# Patient Record
Sex: Female | Born: 1935 | Race: White | Hispanic: No | Smoking: Never smoker
Health system: Southern US, Community
[De-identification: ages and names within clinical notes are randomized; demographics above are authoritative.]

## PROBLEM LIST (undated history)

## (undated) DIAGNOSIS — J9601 Acute respiratory failure with hypoxia: Secondary | ICD-10-CM

## (undated) DIAGNOSIS — I5032 Chronic diastolic (congestive) heart failure: Secondary | ICD-10-CM

## (undated) DIAGNOSIS — S72009A Fracture of unspecified part of neck of unspecified femur, initial encounter for closed fracture: Secondary | ICD-10-CM

## (undated) DIAGNOSIS — D469 Myelodysplastic syndrome, unspecified: Principal | ICD-10-CM

## (undated) DIAGNOSIS — I350 Nonrheumatic aortic (valve) stenosis: Secondary | ICD-10-CM

## (undated) DIAGNOSIS — J9610 Chronic respiratory failure, unspecified whether with hypoxia or hypercapnia: Secondary | ICD-10-CM

## (undated) DIAGNOSIS — G9340 Encephalopathy, unspecified: Secondary | ICD-10-CM

## (undated) DIAGNOSIS — D649 Anemia, unspecified: Secondary | ICD-10-CM

## (undated) DIAGNOSIS — I272 Pulmonary hypertension, unspecified: Secondary | ICD-10-CM

## (undated) DIAGNOSIS — I509 Heart failure, unspecified: Secondary | ICD-10-CM

## (undated) DIAGNOSIS — D489 Neoplasm of uncertain behavior, unspecified: Secondary | ICD-10-CM

## (undated) DIAGNOSIS — L8990 Pressure ulcer of unspecified site, unspecified stage: Secondary | ICD-10-CM

## (undated) DIAGNOSIS — E039 Hypothyroidism, unspecified: Secondary | ICD-10-CM

## (undated) DIAGNOSIS — N184 Chronic kidney disease, stage 4 (severe): Secondary | ICD-10-CM

## (undated) DIAGNOSIS — R0603 Acute respiratory distress: Secondary | ICD-10-CM

## (undated) DIAGNOSIS — I4821 Permanent atrial fibrillation: Secondary | ICD-10-CM

## (undated) DIAGNOSIS — R109 Unspecified abdominal pain: Secondary | ICD-10-CM

## (undated) DIAGNOSIS — G3184 Mild cognitive impairment, so stated: Secondary | ICD-10-CM

## (undated) DIAGNOSIS — T148XXA Other injury of unspecified body region, initial encounter: Secondary | ICD-10-CM

## (undated) DIAGNOSIS — I10 Essential (primary) hypertension: Secondary | ICD-10-CM

## (undated) DIAGNOSIS — R001 Bradycardia, unspecified: Secondary | ICD-10-CM

## (undated) DIAGNOSIS — I4510 Unspecified right bundle-branch block: Secondary | ICD-10-CM

## (undated) DIAGNOSIS — J1100 Influenza due to unidentified influenza virus with unspecified type of pneumonia: Secondary | ICD-10-CM

## (undated) HISTORY — DX: Influenza due to unidentified influenza virus with unspecified type of pneumonia: J11.00

## (undated) HISTORY — DX: Anemia, unspecified: D64.9

## (undated) HISTORY — PX: APPENDECTOMY: SHX54

## (undated) HISTORY — DX: Bradycardia, unspecified: R00.1

## (undated) HISTORY — DX: Fracture of unspecified part of neck of unspecified femur, initial encounter for closed fracture: S72.009A

## (undated) HISTORY — DX: Unspecified abdominal pain: R10.9

## (undated) HISTORY — DX: Acute respiratory distress: R06.03

## (undated) HISTORY — DX: Encephalopathy, unspecified: G93.40

## (undated) HISTORY — DX: Acute respiratory failure with hypoxia: J96.01

## (undated) HISTORY — DX: Pressure ulcer of unspecified site, unspecified stage: L89.90

## (undated) HISTORY — DX: Heart failure, unspecified: I50.9

## (undated) HISTORY — DX: Myelodysplastic syndrome, unspecified: D46.9

## (undated) HISTORY — DX: Nonrheumatic aortic (valve) stenosis: I35.0

## (undated) HISTORY — DX: Unspecified right bundle-branch block: I45.10

## (undated) HISTORY — DX: Other injury of unspecified body region, initial encounter: T14.8XXA

## (undated) HISTORY — DX: Hypothyroidism, unspecified: E03.9

## (undated) HISTORY — DX: Pulmonary hypertension, unspecified: I27.20

## (undated) HISTORY — DX: Neoplasm of uncertain behavior, unspecified: D48.9

---

## 1999-11-27 HISTORY — PX: ORIF FEMUR FRACTURE: SHX2119

## 1999-12-20 ENCOUNTER — Encounter: Payer: Self-pay | Admitting: Emergency Medicine

## 1999-12-21 ENCOUNTER — Inpatient Hospital Stay (HOSPITAL_COMMUNITY): Admission: EM | Admit: 1999-12-21 | Discharge: 1999-12-27 | Payer: Self-pay | Admitting: Emergency Medicine

## 1999-12-21 ENCOUNTER — Encounter: Payer: Self-pay | Admitting: Orthopaedic Surgery

## 2007-03-15 ENCOUNTER — Emergency Department (HOSPITAL_COMMUNITY): Admission: EM | Admit: 2007-03-15 | Discharge: 2007-03-15 | Payer: Self-pay | Admitting: Emergency Medicine

## 2009-01-27 ENCOUNTER — Inpatient Hospital Stay (HOSPITAL_COMMUNITY): Admission: EM | Admit: 2009-01-27 | Discharge: 2009-02-03 | Payer: Self-pay | Admitting: Emergency Medicine

## 2009-01-27 ENCOUNTER — Ambulatory Visit: Payer: Self-pay | Admitting: Internal Medicine

## 2009-01-27 ENCOUNTER — Ambulatory Visit: Payer: Self-pay | Admitting: Surgery

## 2009-01-27 ENCOUNTER — Encounter (INDEPENDENT_AMBULATORY_CARE_PROVIDER_SITE_OTHER): Payer: Self-pay | Admitting: Emergency Medicine

## 2009-01-28 ENCOUNTER — Ambulatory Visit: Payer: Self-pay | Admitting: Hematology & Oncology

## 2009-01-28 ENCOUNTER — Encounter: Payer: Self-pay | Admitting: Internal Medicine

## 2009-01-28 ENCOUNTER — Ambulatory Visit: Payer: Self-pay | Admitting: Internal Medicine

## 2009-02-01 ENCOUNTER — Encounter: Payer: Self-pay | Admitting: Gastroenterology

## 2009-02-01 ENCOUNTER — Encounter (INDEPENDENT_AMBULATORY_CARE_PROVIDER_SITE_OTHER): Payer: Self-pay | Admitting: Interventional Radiology

## 2009-02-03 ENCOUNTER — Ambulatory Visit: Payer: Self-pay | Admitting: Hematology & Oncology

## 2009-02-08 ENCOUNTER — Encounter: Payer: Self-pay | Admitting: Gastroenterology

## 2009-02-09 ENCOUNTER — Encounter: Payer: Self-pay | Admitting: Cardiology

## 2009-02-09 LAB — CONVERTED CEMR LAB
Basophils Absolute: 0 10*3/uL (ref 0.0–0.1)
Eosinophils Absolute: 0.1 10*3/uL (ref 0.0–0.7)
Hemoglobin: 9.7 g/dL — ABNORMAL LOW (ref 12.0–15.0)
Lymphocytes Relative: 10.9 % — ABNORMAL LOW (ref 12.0–46.0)
Lymphs Abs: 1 10*3/uL (ref 0.7–4.0)
MCHC: 32.9 g/dL (ref 30.0–36.0)
Neutro Abs: 6.9 10*3/uL (ref 1.4–7.7)
RDW: 20.1 % — ABNORMAL HIGH (ref 11.5–14.6)

## 2009-02-15 DIAGNOSIS — D599 Acquired hemolytic anemia, unspecified: Secondary | ICD-10-CM | POA: Insufficient documentation

## 2009-02-15 DIAGNOSIS — D649 Anemia, unspecified: Secondary | ICD-10-CM | POA: Insufficient documentation

## 2009-02-15 DIAGNOSIS — E039 Hypothyroidism, unspecified: Secondary | ICD-10-CM

## 2009-02-16 ENCOUNTER — Encounter: Payer: Self-pay | Admitting: Internal Medicine

## 2009-02-16 ENCOUNTER — Ambulatory Visit: Payer: Self-pay | Admitting: Internal Medicine

## 2009-02-23 ENCOUNTER — Ambulatory Visit: Payer: Self-pay | Admitting: Oncology

## 2009-02-25 ENCOUNTER — Encounter (HOSPITAL_COMMUNITY): Admission: RE | Admit: 2009-02-25 | Discharge: 2009-03-31 | Payer: Self-pay | Admitting: Oncology

## 2009-02-25 LAB — CBC WITH DIFFERENTIAL/PLATELET
Basophils Absolute: 0.1 10*3/uL (ref 0.0–0.1)
EOS%: 2.8 % (ref 0.0–7.0)
Eosinophils Absolute: 0.3 10*3/uL (ref 0.0–0.5)
HGB: 7.8 g/dL — ABNORMAL LOW (ref 11.6–15.9)
NEUT#: 8.8 10*3/uL — ABNORMAL HIGH (ref 1.5–6.5)
RBC: 2.82 10*6/uL — ABNORMAL LOW (ref 3.70–5.45)
RDW: 22.5 % — ABNORMAL HIGH (ref 11.2–14.5)
lymph#: 1 10*3/uL (ref 0.9–3.3)

## 2009-02-28 LAB — TYPE & CROSSMATCH - CHCC

## 2009-02-28 LAB — FERRITIN: Ferritin: 1010 ng/mL — ABNORMAL HIGH (ref 10–291)

## 2009-03-09 ENCOUNTER — Encounter: Payer: Self-pay | Admitting: Cardiovascular Disease

## 2009-03-09 LAB — CBC WITH DIFFERENTIAL/PLATELET
BASO%: 0.3 % (ref 0.0–2.0)
Basophils Absolute: 0 10*3/uL (ref 0.0–0.1)
EOS%: 4.4 % (ref 0.0–7.0)
HCT: 25.9 % — ABNORMAL LOW (ref 34.8–46.6)
HGB: 8.6 g/dL — ABNORMAL LOW (ref 11.6–15.9)
LYMPH%: 6.7 % — ABNORMAL LOW (ref 14.0–49.7)
MCH: 27.5 pg (ref 25.1–34.0)
MCHC: 33.1 g/dL (ref 31.5–36.0)
MCV: 82.9 fL (ref 79.5–101.0)
MONO%: 7.3 % (ref 0.0–14.0)
NEUT%: 81.3 % — ABNORMAL HIGH (ref 38.4–76.8)
Platelets: 504 10*3/uL — ABNORMAL HIGH (ref 145–400)

## 2009-03-10 LAB — CBC & DIFF AND RETIC
EOS%: 6 % (ref 0.0–7.0)
Eosinophils Absolute: 0.5 10*3/uL (ref 0.0–0.5)
HGB: 8.4 g/dL — ABNORMAL LOW (ref 11.6–15.9)
IRF: 0.45 — ABNORMAL HIGH (ref 0.130–0.330)
MCV: 83.2 fL (ref 79.5–101.0)
MONO%: 6.7 % (ref 0.0–14.0)
NEUT#: 7.1 10*3/uL — ABNORMAL HIGH (ref 1.5–6.5)
RBC: 3.03 10*6/uL — ABNORMAL LOW (ref 3.70–5.45)
RDW: 21.8 % — ABNORMAL HIGH (ref 11.2–14.5)
RETIC #: 66.4 10*3/uL (ref 19.7–115.1)
Retic %: 2.2 % (ref 0.4–2.3)
WBC: 9.1 10*3/uL (ref 3.9–10.3)
lymph#: 0.8 10*3/uL — ABNORMAL LOW (ref 0.9–3.3)

## 2009-03-10 LAB — MORPHOLOGY: PLT EST: INCREASED

## 2009-03-10 LAB — COMPREHENSIVE METABOLIC PANEL
Albumin: 2.7 g/dL — ABNORMAL LOW (ref 3.5–5.2)
BUN: 22 mg/dL (ref 6–23)
Calcium: 8.6 mg/dL (ref 8.4–10.5)
Creatinine, Ser: 0.93 mg/dL (ref 0.40–1.20)
Potassium: 4.2 mEq/L (ref 3.5–5.3)
Total Protein: 6.7 g/dL (ref 6.0–8.3)

## 2009-03-10 LAB — CHCC SMEAR

## 2009-03-14 LAB — CBC WITH DIFFERENTIAL/PLATELET
BASO%: 0 % (ref 0.0–2.0)
Basophils Absolute: 0 10e3/uL (ref 0.0–0.1)
EOS%: 4.8 % (ref 0.0–7.0)
Eosinophils Absolute: 0.5 10e3/uL (ref 0.0–0.5)
HCT: 25.3 % — ABNORMAL LOW (ref 34.8–46.6)
HGB: 8.4 g/dL — ABNORMAL LOW (ref 11.6–15.9)
LYMPH%: 6.8 % — ABNORMAL LOW (ref 14.0–49.7)
MCH: 27.4 pg (ref 25.1–34.0)
MCHC: 33.2 g/dL (ref 31.5–36.0)
MCV: 82.7 fL (ref 79.5–101.0)
MONO#: 0.6 10e3/uL (ref 0.1–0.9)
MONO%: 5.6 % (ref 0.0–14.0)
NEUT#: 8.9 10e3/uL — ABNORMAL HIGH (ref 1.5–6.5)
NEUT%: 82.8 % — ABNORMAL HIGH (ref 38.4–76.8)
Platelets: 551 10e3/uL — ABNORMAL HIGH (ref 145–400)
RBC: 3.05 10e6/uL — ABNORMAL LOW (ref 3.70–5.45)
RDW: 22.4 % — ABNORMAL HIGH (ref 11.2–14.5)
WBC: 10.8 10e3/uL — ABNORMAL HIGH (ref 3.9–10.3)
lymph#: 0.7 10e3/uL — ABNORMAL LOW (ref 0.9–3.3)

## 2009-03-14 LAB — IMMUNOFIXATION ELECTROPHORESIS: Total Protein, Serum Electrophoresis: 6.7 g/dL (ref 6.0–8.3)

## 2009-03-14 LAB — IRON AND TIBC
%SAT: 16 % — ABNORMAL LOW (ref 20–55)
TIBC: 228 ug/dL — ABNORMAL LOW (ref 250–470)
UIBC: 191 ug/dL

## 2009-03-14 LAB — HOLD TUBE, BLOOD BANK

## 2009-03-14 LAB — ANA: Anti Nuclear Antibody(ANA): NEGATIVE

## 2009-03-14 LAB — FERRITIN: Ferritin: 1375 ng/mL — ABNORMAL HIGH (ref 10–291)

## 2009-03-14 LAB — ERYTHROPOIETIN: Erythropoietin: 84.8 m[IU]/mL — ABNORMAL HIGH (ref 2.6–34.0)

## 2009-03-14 LAB — SEDIMENTATION RATE: Sed Rate: 2 mm/h (ref 0–22)

## 2009-03-16 ENCOUNTER — Ambulatory Visit (HOSPITAL_COMMUNITY): Admission: RE | Admit: 2009-03-16 | Discharge: 2009-03-16 | Payer: Self-pay | Admitting: Oncology

## 2009-03-17 LAB — UIFE/LIGHT CHAINS/TP QN, 24-HR UR
Alpha 2, Urine: DETECTED — AB
Beta, Urine: DETECTED — AB
Free Kappa Lt Chains,Ur: 7.91 mg/dL — ABNORMAL HIGH (ref 0.04–1.51)
Free Lt Chn Excr Rate: 61.3 mg/d
Total Protein, Urine: 14.6 mg/dL

## 2009-03-17 LAB — CREATININE CLEARANCE, URINE, 24 HOUR
Collection Interval-CRCL: 24 hours
Creatinine Clearance: 47 mL/min — ABNORMAL LOW (ref 75–115)
Creatinine, Urine: 95.5 mg/dL

## 2009-03-23 LAB — CBC WITH DIFFERENTIAL/PLATELET
BASO%: 1.1 % (ref 0.0–2.0)
LYMPH%: 8.1 % — ABNORMAL LOW (ref 14.0–49.7)
MCHC: 31.9 g/dL (ref 31.5–36.0)
MONO#: 1.1 10*3/uL — ABNORMAL HIGH (ref 0.1–0.9)
RBC: 3.31 10*6/uL — ABNORMAL LOW (ref 3.70–5.45)
RDW: 21.1 % — ABNORMAL HIGH (ref 11.2–14.5)
WBC: 11.4 10*3/uL — ABNORMAL HIGH (ref 3.9–10.3)
lymph#: 0.9 10*3/uL (ref 0.9–3.3)
nRBC: 0 % (ref 0–0)

## 2009-03-30 ENCOUNTER — Telehealth: Payer: Self-pay | Admitting: Internal Medicine

## 2009-03-30 ENCOUNTER — Encounter: Payer: Self-pay | Admitting: Cardiovascular Disease

## 2009-03-30 LAB — CBC WITH DIFFERENTIAL/PLATELET
BASO%: 0.1 % (ref 0.0–2.0)
EOS%: 7.7 % — ABNORMAL HIGH (ref 0.0–7.0)
Eosinophils Absolute: 0.9 10*3/uL — ABNORMAL HIGH (ref 0.0–0.5)
MCH: 27.8 pg (ref 25.1–34.0)
MCV: 83.6 fL (ref 79.5–101.0)
MONO%: 7 % (ref 0.0–14.0)
NEUT#: 9.3 10*3/uL — ABNORMAL HIGH (ref 1.5–6.5)
RBC: 3.09 10*6/uL — ABNORMAL LOW (ref 3.70–5.45)
RDW: 22.9 % — ABNORMAL HIGH (ref 11.2–14.5)

## 2009-03-30 LAB — TYPE & CROSSMATCH - CHCC

## 2009-04-06 LAB — CBC WITH DIFFERENTIAL/PLATELET
BASO%: 0.9 % (ref 0.0–2.0)
MCHC: 32.3 g/dL (ref 31.5–36.0)
MONO#: 1 10*3/uL — ABNORMAL HIGH (ref 0.1–0.9)
RBC: 3.51 10*6/uL — ABNORMAL LOW (ref 3.70–5.45)
RDW: 21 % — ABNORMAL HIGH (ref 11.2–14.5)
WBC: 11.6 10*3/uL — ABNORMAL HIGH (ref 3.9–10.3)
lymph#: 1 10*3/uL (ref 0.9–3.3)

## 2009-04-18 ENCOUNTER — Ambulatory Visit: Payer: Self-pay | Admitting: Oncology

## 2009-04-21 ENCOUNTER — Encounter (HOSPITAL_COMMUNITY): Admission: RE | Admit: 2009-04-21 | Discharge: 2009-05-25 | Payer: Self-pay | Admitting: Oncology

## 2009-05-02 ENCOUNTER — Encounter: Payer: Self-pay | Admitting: Cardiovascular Disease

## 2009-05-03 ENCOUNTER — Observation Stay (HOSPITAL_COMMUNITY): Admission: AD | Admit: 2009-05-03 | Discharge: 2009-05-03 | Payer: Self-pay | Admitting: Oncology

## 2009-05-03 ENCOUNTER — Encounter: Payer: Self-pay | Admitting: Cardiovascular Disease

## 2009-05-03 LAB — COMPREHENSIVE METABOLIC PANEL
ALT: 8 U/L (ref 0–35)
CO2: 23 mEq/L (ref 19–32)
Calcium: 8.8 mg/dL (ref 8.4–10.5)
Chloride: 104 mEq/L (ref 96–112)
Glucose, Bld: 111 mg/dL — ABNORMAL HIGH (ref 70–99)
Sodium: 137 mEq/L (ref 135–145)
Total Protein: 7.5 g/dL (ref 6.0–8.3)

## 2009-05-03 LAB — CBC WITH DIFFERENTIAL/PLATELET
BASO%: 0.7 % (ref 0.0–2.0)
Eosinophils Absolute: 0.2 10*3/uL (ref 0.0–0.5)
HCT: 23.7 % — ABNORMAL LOW (ref 34.8–46.6)
MCHC: 31.2 g/dL — ABNORMAL LOW (ref 31.5–36.0)
MONO#: 0.6 10*3/uL (ref 0.1–0.9)
NEUT#: 4.5 10*3/uL (ref 1.5–6.5)
NEUT%: 75.3 % (ref 38.4–76.8)
Platelets: 256 10*3/uL (ref 145–400)
WBC: 6 10*3/uL (ref 3.9–10.3)
lymph#: 0.7 10*3/uL — ABNORMAL LOW (ref 0.9–3.3)

## 2009-05-04 LAB — HEPATITIS B SURFACE ANTIGEN: Hepatitis B Surface Ag: NEGATIVE

## 2009-05-04 LAB — TYPE & CROSSMATCH - CHCC

## 2009-05-04 LAB — HEPATITIS C ANTIBODY: HCV Ab: NEGATIVE

## 2009-05-18 LAB — CBC WITH DIFFERENTIAL/PLATELET
BASO%: 1.1 % (ref 0.0–2.0)
LYMPH%: 13.9 % — ABNORMAL LOW (ref 14.0–49.7)
MCHC: 34.7 g/dL (ref 31.5–36.0)
MCV: 89.3 fL (ref 79.5–101.0)
MONO%: 8.6 % (ref 0.0–14.0)
Platelets: 317 10*3/uL (ref 145–400)
RBC: 3.13 10*6/uL — ABNORMAL LOW (ref 3.70–5.45)
RDW: 24.3 % — ABNORMAL HIGH (ref 11.2–14.5)
WBC: 6.7 10*3/uL (ref 3.9–10.3)

## 2009-06-02 ENCOUNTER — Ambulatory Visit: Payer: Self-pay | Admitting: Oncology

## 2009-06-02 LAB — CBC & DIFF AND RETIC
Basophils Absolute: 0.1 10*3/uL (ref 0.0–0.1)
Eosinophils Absolute: 0.3 10*3/uL (ref 0.0–0.5)
HGB: 9.8 g/dL — ABNORMAL LOW (ref 11.6–15.9)
Immature Retic Fract: 5 % (ref 0.00–10.70)
MCV: 89.6 fL (ref 79.5–101.0)
MONO#: 0.8 10*3/uL (ref 0.1–0.9)
MONO%: 10.2 % (ref 0.0–14.0)
NEUT#: 5.2 10*3/uL (ref 1.5–6.5)
RBC: 3.38 10*6/uL — ABNORMAL LOW (ref 3.70–5.45)
RDW: 20.5 % — ABNORMAL HIGH (ref 11.2–14.5)
Retic %: 1.43 % (ref 0.50–1.50)
Retic Ct Abs: 48.33 10*3/uL (ref 18.30–72.70)
WBC: 7.4 10*3/uL (ref 3.9–10.3)
lymph#: 1.1 10*3/uL (ref 0.9–3.3)
nRBC: 0 % (ref 0–0)

## 2009-06-17 ENCOUNTER — Encounter: Payer: Self-pay | Admitting: Cardiovascular Disease

## 2009-06-17 LAB — CBC WITH DIFFERENTIAL/PLATELET
BASO%: 1 % (ref 0.0–2.0)
Eosinophils Absolute: 0.3 10*3/uL (ref 0.0–0.5)
LYMPH%: 11.7 % — ABNORMAL LOW (ref 14.0–49.7)
MCHC: 33.6 g/dL (ref 31.5–36.0)
MONO#: 0.6 10*3/uL (ref 0.1–0.9)
NEUT#: 5.4 10*3/uL (ref 1.5–6.5)
Platelets: 326 10*3/uL (ref 145–400)
RBC: 3.39 10*6/uL — ABNORMAL LOW (ref 3.70–5.45)
RDW: 20.9 % — ABNORMAL HIGH (ref 11.2–14.5)
WBC: 7.1 10*3/uL (ref 3.9–10.3)
lymph#: 0.8 10*3/uL — ABNORMAL LOW (ref 0.9–3.3)

## 2009-06-28 ENCOUNTER — Ambulatory Visit: Payer: Self-pay | Admitting: Oncology

## 2009-06-30 LAB — CBC WITH DIFFERENTIAL/PLATELET
BASO%: 1 % (ref 0.0–2.0)
Eosinophils Absolute: 0.3 10*3/uL (ref 0.0–0.5)
MCHC: 32.4 g/dL (ref 31.5–36.0)
MONO#: 0.5 10*3/uL (ref 0.1–0.9)
NEUT#: 4.3 10*3/uL (ref 1.5–6.5)
RBC: 3.66 10*6/uL — ABNORMAL LOW (ref 3.70–5.45)
RDW: 19.5 % — ABNORMAL HIGH (ref 11.2–14.5)
WBC: 6 10*3/uL (ref 3.9–10.3)
nRBC: 0 % (ref 0–0)

## 2009-07-14 LAB — CBC WITH DIFFERENTIAL/PLATELET
BASO%: 1.1 % (ref 0.0–2.0)
EOS%: 5.5 % (ref 0.0–7.0)
HCT: 32 % — ABNORMAL LOW (ref 34.8–46.6)
HGB: 10.2 g/dL — ABNORMAL LOW (ref 11.6–15.9)
MCHC: 31.9 g/dL (ref 31.5–36.0)
MONO#: 0.6 10*3/uL (ref 0.1–0.9)
NEUT%: 70.4 % (ref 38.4–76.8)
RDW: 19.7 % — ABNORMAL HIGH (ref 11.2–14.5)
WBC: 7.1 10*3/uL (ref 3.9–10.3)
lymph#: 1 10*3/uL (ref 0.9–3.3)

## 2009-07-29 ENCOUNTER — Ambulatory Visit: Payer: Self-pay | Admitting: Oncology

## 2009-07-29 LAB — CBC WITH DIFFERENTIAL/PLATELET
BASO%: 1 % (ref 0.0–2.0)
EOS%: 5.1 % (ref 0.0–7.0)
HCT: 35 % (ref 34.8–46.6)
LYMPH%: 14.8 % (ref 14.0–49.7)
MCH: 29.3 pg (ref 25.1–34.0)
MCHC: 32.6 g/dL (ref 31.5–36.0)
MCV: 90 fL (ref 79.5–101.0)
MONO%: 8.8 % (ref 0.0–14.0)
NEUT%: 70.3 % (ref 38.4–76.8)
lymph#: 1.2 10*3/uL (ref 0.9–3.3)

## 2009-08-18 LAB — CBC WITH DIFFERENTIAL/PLATELET
BASO%: 1.3 % (ref 0.0–2.0)
EOS%: 5.7 % (ref 0.0–7.0)
LYMPH%: 13.4 % — ABNORMAL LOW (ref 14.0–49.7)
MCH: 29.1 pg (ref 25.1–34.0)
MCHC: 32.5 g/dL (ref 31.5–36.0)
MCV: 89.5 fL (ref 79.5–101.0)
MONO%: 9.3 % (ref 0.0–14.0)
NEUT#: 5.3 10*3/uL (ref 1.5–6.5)
RBC: 3.51 10*6/uL — ABNORMAL LOW (ref 3.70–5.45)
RDW: 18.9 % — ABNORMAL HIGH (ref 11.2–14.5)

## 2009-09-06 ENCOUNTER — Ambulatory Visit: Payer: Self-pay | Admitting: Oncology

## 2009-09-08 LAB — CBC WITH DIFFERENTIAL/PLATELET
Eosinophils Absolute: 0.3 10*3/uL (ref 0.0–0.5)
LYMPH%: 14.9 % (ref 14.0–49.7)
MCHC: 32.7 g/dL (ref 31.5–36.0)
MCV: 91 fL (ref 79.5–101.0)
MONO%: 8.5 % (ref 0.0–14.0)
NEUT#: 5.1 10*3/uL (ref 1.5–6.5)
Platelets: 249 10*3/uL (ref 145–400)
RBC: 3.56 10*6/uL — ABNORMAL LOW (ref 3.70–5.45)
nRBC: 0 % (ref 0–0)

## 2009-09-29 LAB — CBC WITH DIFFERENTIAL/PLATELET
Eosinophils Absolute: 0.2 10*3/uL (ref 0.0–0.5)
HCT: 34 % — ABNORMAL LOW (ref 34.8–46.6)
LYMPH%: 16.7 % (ref 14.0–49.7)
MCHC: 32.4 g/dL (ref 31.5–36.0)
MONO#: 0.5 10*3/uL (ref 0.1–0.9)
NEUT#: 4.7 10*3/uL (ref 1.5–6.5)
NEUT%: 70.6 % (ref 38.4–76.8)
Platelets: 268 10*3/uL (ref 145–400)
WBC: 6.7 10*3/uL (ref 3.9–10.3)

## 2009-10-17 ENCOUNTER — Ambulatory Visit: Payer: Self-pay | Admitting: Oncology

## 2009-10-21 LAB — CBC WITH DIFFERENTIAL/PLATELET
Basophils Absolute: 0.1 10*3/uL (ref 0.0–0.1)
EOS%: 4.2 % (ref 0.0–7.0)
HCT: 34.1 % — ABNORMAL LOW (ref 34.8–46.6)
HGB: 10.9 g/dL — ABNORMAL LOW (ref 11.6–15.9)
MCH: 29.8 pg (ref 25.1–34.0)
MCV: 93.2 fL (ref 79.5–101.0)
MONO%: 7.5 % (ref 0.0–14.0)
NEUT%: 70.9 % (ref 38.4–76.8)

## 2009-11-10 LAB — CBC WITH DIFFERENTIAL/PLATELET
Basophils Absolute: 0.1 10*3/uL (ref 0.0–0.1)
Eosinophils Absolute: 0.3 10*3/uL (ref 0.0–0.5)
HGB: 10.5 g/dL — ABNORMAL LOW (ref 11.6–15.9)
LYMPH%: 15.8 % (ref 14.0–49.7)
MONO#: 0.4 10*3/uL (ref 0.1–0.9)
NEUT#: 4.8 10*3/uL (ref 1.5–6.5)
Platelets: 313 10*3/uL (ref 145–400)
RBC: 3.39 10*6/uL — ABNORMAL LOW (ref 3.70–5.45)
WBC: 6.7 10*3/uL (ref 3.9–10.3)

## 2009-11-28 ENCOUNTER — Ambulatory Visit: Payer: Self-pay | Admitting: Oncology

## 2009-12-01 LAB — CBC WITH DIFFERENTIAL/PLATELET
BASO%: 0.7 % (ref 0.0–2.0)
EOS%: 3.5 % (ref 0.0–7.0)
HGB: 10.6 g/dL — ABNORMAL LOW (ref 11.6–15.9)
MCH: 30.6 pg (ref 25.1–34.0)
MCHC: 33.3 g/dL (ref 31.5–36.0)
RDW: 19.9 % — ABNORMAL HIGH (ref 11.2–14.5)
lymph#: 1.2 10*3/uL (ref 0.9–3.3)

## 2009-12-01 LAB — COMPREHENSIVE METABOLIC PANEL
ALT: 13 U/L (ref 0–35)
AST: 17 U/L (ref 0–37)
Albumin: 4 g/dL (ref 3.5–5.2)
Calcium: 9.1 mg/dL (ref 8.4–10.5)
Chloride: 99 mEq/L (ref 96–112)
Potassium: 4.3 mEq/L (ref 3.5–5.3)

## 2009-12-01 LAB — IRON AND TIBC: TIBC: 270 ug/dL (ref 250–470)

## 2009-12-22 LAB — CBC WITH DIFFERENTIAL/PLATELET
Eosinophils Absolute: 0.3 10*3/uL (ref 0.0–0.5)
HCT: 30.7 % — ABNORMAL LOW (ref 34.8–46.6)
LYMPH%: 18.1 % (ref 14.0–49.7)
MONO#: 0.5 10*3/uL (ref 0.1–0.9)
NEUT#: 4.4 10*3/uL (ref 1.5–6.5)
Platelets: 303 10*3/uL (ref 145–400)
RBC: 3.38 10*6/uL — ABNORMAL LOW (ref 3.70–5.45)
WBC: 6.5 10*3/uL (ref 3.9–10.3)
lymph#: 1.2 10*3/uL (ref 0.9–3.3)

## 2010-01-09 ENCOUNTER — Ambulatory Visit: Payer: Self-pay | Admitting: Oncology

## 2010-01-11 LAB — CBC WITH DIFFERENTIAL/PLATELET
BASO%: 0.9 % (ref 0.0–2.0)
Eosinophils Absolute: 0.3 10*3/uL (ref 0.0–0.5)
LYMPH%: 12.7 % — ABNORMAL LOW (ref 14.0–49.7)
MCHC: 31.8 g/dL (ref 31.5–36.0)
MONO#: 0.6 10*3/uL (ref 0.1–0.9)
NEUT#: 5 10*3/uL (ref 1.5–6.5)
Platelets: 245 10*3/uL (ref 145–400)
RBC: 3.87 10*6/uL (ref 3.70–5.45)
RDW: 19 % — ABNORMAL HIGH (ref 11.2–14.5)
WBC: 6.7 10*3/uL (ref 3.9–10.3)
lymph#: 0.9 10*3/uL (ref 0.9–3.3)

## 2010-02-03 LAB — CBC & DIFF AND RETIC
Basophils Absolute: 0.1 10*3/uL (ref 0.0–0.1)
Eosinophils Absolute: 0.2 10*3/uL (ref 0.0–0.5)
HGB: 10.7 g/dL — ABNORMAL LOW (ref 11.6–15.9)
Immature Retic Fract: 8.5 % (ref 0.00–10.70)
MCV: 90.5 fL (ref 79.5–101.0)
MONO#: 0.4 10*3/uL (ref 0.1–0.9)
MONO%: 4.9 % (ref 0.0–14.0)
NEUT#: 6.2 10*3/uL (ref 1.5–6.5)
Platelets: 303 10*3/uL (ref 145–400)
RDW: 19.5 % — ABNORMAL HIGH (ref 11.2–14.5)
Retic %: 1.71 % — ABNORMAL HIGH (ref 0.50–1.50)
WBC: 7.9 10*3/uL (ref 3.9–10.3)
nRBC: 0 % (ref 0–0)

## 2010-02-03 LAB — MORPHOLOGY

## 2010-03-03 ENCOUNTER — Ambulatory Visit: Payer: Self-pay | Admitting: Oncology

## 2010-03-03 LAB — CBC WITH DIFFERENTIAL/PLATELET
BASO%: 1.1 % (ref 0.0–2.0)
Basophils Absolute: 0.1 10*3/uL (ref 0.0–0.1)
EOS%: 4.1 % (ref 0.0–7.0)
HCT: 31.4 % — ABNORMAL LOW (ref 34.8–46.6)
LYMPH%: 18.5 % (ref 14.0–49.7)
MCH: 29.8 pg (ref 25.1–34.0)
MCHC: 32.8 g/dL (ref 31.5–36.0)
NEUT%: 67.7 % (ref 38.4–76.8)
Platelets: 264 10*3/uL (ref 145–400)

## 2010-03-31 LAB — CBC WITH DIFFERENTIAL/PLATELET
Basophils Absolute: 0.1 10*3/uL (ref 0.0–0.1)
EOS%: 4 % (ref 0.0–7.0)
HCT: 32.3 % — ABNORMAL LOW (ref 34.8–46.6)
HGB: 10.2 g/dL — ABNORMAL LOW (ref 11.6–15.9)
LYMPH%: 13.5 % — ABNORMAL LOW (ref 14.0–49.7)
MCH: 29.3 pg (ref 25.1–34.0)
MCV: 92.8 fL (ref 79.5–101.0)
MONO%: 7.3 % (ref 0.0–14.0)
NEUT%: 73.9 % (ref 38.4–76.8)
Platelets: 286 10*3/uL (ref 145–400)

## 2010-04-27 ENCOUNTER — Ambulatory Visit: Payer: Self-pay | Admitting: Oncology

## 2010-04-28 LAB — CBC WITH DIFFERENTIAL/PLATELET
Basophils Absolute: 0.1 10*3/uL (ref 0.0–0.1)
EOS%: 4.8 % (ref 0.0–7.0)
HCT: 32 % — ABNORMAL LOW (ref 34.8–46.6)
HGB: 10.2 g/dL — ABNORMAL LOW (ref 11.6–15.9)
MCH: 29.7 pg (ref 25.1–34.0)
NEUT%: 66.1 % (ref 38.4–76.8)
lymph#: 1.1 10*3/uL (ref 0.9–3.3)

## 2010-05-26 LAB — CBC WITH DIFFERENTIAL/PLATELET
BASO%: 1.9 % (ref 0.0–2.0)
Basophils Absolute: 0.1 10*3/uL (ref 0.0–0.1)
EOS%: 4.3 % (ref 0.0–7.0)
HGB: 10.3 g/dL — ABNORMAL LOW (ref 11.6–15.9)
MCH: 29.7 pg (ref 25.1–34.0)
MCHC: 32.3 g/dL (ref 31.5–36.0)
MCV: 91.9 fL (ref 79.5–101.0)
MONO%: 8 % (ref 0.0–14.0)
RBC: 3.47 10*6/uL — ABNORMAL LOW (ref 3.70–5.45)
RDW: 18.9 % — ABNORMAL HIGH (ref 11.2–14.5)

## 2010-06-07 ENCOUNTER — Ambulatory Visit: Payer: Self-pay | Admitting: Oncology

## 2010-06-09 LAB — CBC WITH DIFFERENTIAL/PLATELET
BASO%: 0.7 % (ref 0.0–2.0)
LYMPH%: 9.7 % — ABNORMAL LOW (ref 14.0–49.7)
MCHC: 34.2 g/dL (ref 31.5–36.0)
MONO#: 0.4 10*3/uL (ref 0.1–0.9)
MONO%: 6.5 % (ref 0.0–14.0)
Platelets: 280 10*3/uL (ref 145–400)
RBC: 3.4 10*6/uL — ABNORMAL LOW (ref 3.70–5.45)
RDW: 20.4 % — ABNORMAL HIGH (ref 11.2–14.5)
WBC: 6.7 10*3/uL (ref 3.9–10.3)

## 2010-06-09 LAB — MORPHOLOGY

## 2010-06-13 LAB — KAPPA/LAMBDA LIGHT CHAINS: Kappa:Lambda Ratio: 0.75 (ref 0.26–1.65)

## 2010-06-13 LAB — LACTATE DEHYDROGENASE: LDH: 206 U/L (ref 94–250)

## 2010-06-13 LAB — COMPREHENSIVE METABOLIC PANEL
Albumin: 3.9 g/dL (ref 3.5–5.2)
CO2: 27 mEq/L (ref 19–32)
Calcium: 9.3 mg/dL (ref 8.4–10.5)
Glucose, Bld: 91 mg/dL (ref 70–99)
Potassium: 4.3 mEq/L (ref 3.5–5.3)
Sodium: 135 mEq/L (ref 135–145)
Total Protein: 7.2 g/dL (ref 6.0–8.3)

## 2010-06-13 LAB — IMMUNOFIXATION ELECTROPHORESIS: Total Protein, Serum Electrophoresis: 7.2 g/dL (ref 6.0–8.3)

## 2010-06-23 LAB — CBC WITH DIFFERENTIAL/PLATELET
BASO%: 0.1 % (ref 0.0–2.0)
Basophils Absolute: 0 10*3/uL (ref 0.0–0.1)
HCT: 32.3 % — ABNORMAL LOW (ref 34.8–46.6)
HGB: 10.6 g/dL — ABNORMAL LOW (ref 11.6–15.9)
MCHC: 32.9 g/dL (ref 31.5–36.0)
MONO#: 0.2 10*3/uL (ref 0.1–0.9)
NEUT#: 5.2 10*3/uL (ref 1.5–6.5)
NEUT%: 80.1 % — ABNORMAL HIGH (ref 38.4–76.8)
WBC: 6.5 10*3/uL (ref 3.9–10.3)
lymph#: 0.8 10*3/uL — ABNORMAL LOW (ref 0.9–3.3)

## 2010-07-07 ENCOUNTER — Ambulatory Visit: Payer: Self-pay | Admitting: Oncology

## 2010-07-07 LAB — CBC WITH DIFFERENTIAL/PLATELET
BASO%: 0.8 % (ref 0.0–2.0)
Basophils Absolute: 0.1 10*3/uL (ref 0.0–0.1)
EOS%: 3.5 % (ref 0.0–7.0)
HCT: 31.2 % — ABNORMAL LOW (ref 34.8–46.6)
HGB: 10 g/dL — ABNORMAL LOW (ref 11.6–15.9)
LYMPH%: 14.4 % (ref 14.0–49.7)
MCH: 28.7 pg (ref 25.1–34.0)
MCHC: 32.1 g/dL (ref 31.5–36.0)
MCV: 89.7 fL (ref 79.5–101.0)
MONO%: 7.6 % (ref 0.0–14.0)
NEUT%: 73.7 % (ref 38.4–76.8)
Platelets: 237 10*3/uL (ref 145–400)

## 2010-08-09 ENCOUNTER — Ambulatory Visit: Payer: Self-pay | Admitting: Oncology

## 2010-08-11 LAB — CBC WITH DIFFERENTIAL/PLATELET
Eosinophils Absolute: 0.2 10*3/uL (ref 0.0–0.5)
HCT: 32.4 % — ABNORMAL LOW (ref 34.8–46.6)
LYMPH%: 12.7 % — ABNORMAL LOW (ref 14.0–49.7)
MCV: 91 fL (ref 79.5–101.0)
MONO#: 0.6 10*3/uL (ref 0.1–0.9)
MONO%: 8.2 % (ref 0.0–14.0)
NEUT#: 5.5 10*3/uL (ref 1.5–6.5)
NEUT%: 74.7 % (ref 38.4–76.8)
Platelets: 262 10*3/uL (ref 145–400)
RBC: 3.56 10*6/uL — ABNORMAL LOW (ref 3.70–5.45)
WBC: 7.3 10*3/uL (ref 3.9–10.3)

## 2010-09-08 ENCOUNTER — Ambulatory Visit: Payer: Self-pay | Admitting: Oncology

## 2010-09-08 LAB — COMPREHENSIVE METABOLIC PANEL
BUN: 23 mg/dL (ref 6–23)
CO2: 30 mEq/L (ref 19–32)
Calcium: 9 mg/dL (ref 8.4–10.5)
Chloride: 105 mEq/L (ref 96–112)
Creatinine, Ser: 1.23 mg/dL — ABNORMAL HIGH (ref 0.40–1.20)
Glucose, Bld: 99 mg/dL (ref 70–99)
Total Bilirubin: 1.4 mg/dL — ABNORMAL HIGH (ref 0.3–1.2)

## 2010-09-08 LAB — CBC WITH DIFFERENTIAL/PLATELET
BASO%: 0.5 % (ref 0.0–2.0)
Basophils Absolute: 0 10*3/uL (ref 0.0–0.1)
Eosinophils Absolute: 0.2 10*3/uL (ref 0.0–0.5)
HCT: 28.9 % — ABNORMAL LOW (ref 34.8–46.6)
HGB: 9.8 g/dL — ABNORMAL LOW (ref 11.6–15.9)
LYMPH%: 16.4 % (ref 14.0–49.7)
MCHC: 33.8 g/dL (ref 31.5–36.0)
MONO#: 0.4 10*3/uL (ref 0.1–0.9)
NEUT%: 72.1 % (ref 38.4–76.8)
Platelets: 297 10*3/uL (ref 145–400)
WBC: 6.1 10*3/uL (ref 3.9–10.3)

## 2010-09-08 LAB — LACTATE DEHYDROGENASE: LDH: 186 U/L (ref 94–250)

## 2010-09-13 LAB — IMMUNOFIXATION ELECTROPHORESIS
IgA: 243 mg/dL (ref 68–378)
IgG (Immunoglobin G), Serum: 1730 mg/dL — ABNORMAL HIGH (ref 694–1618)
IgM, Serum: 142 mg/dL (ref 60–263)
Total Protein, Serum Electrophoresis: 7.2 g/dL (ref 6.0–8.3)

## 2010-09-29 ENCOUNTER — Encounter: Payer: Self-pay | Admitting: Internal Medicine

## 2010-09-29 LAB — CBC WITH DIFFERENTIAL/PLATELET
Basophils Absolute: 0.1 10*3/uL (ref 0.0–0.1)
EOS%: 3.6 % (ref 0.0–7.0)
Eosinophils Absolute: 0.3 10*3/uL (ref 0.0–0.5)
HCT: 31.7 % — ABNORMAL LOW (ref 34.8–46.6)
HGB: 10.1 g/dL — ABNORMAL LOW (ref 11.6–15.9)
MCH: 28.6 pg (ref 25.1–34.0)
MCV: 89.8 fL (ref 79.5–101.0)
MONO%: 7.6 % (ref 0.0–14.0)
NEUT#: 5.5 10*3/uL (ref 1.5–6.5)
NEUT%: 73.3 % (ref 38.4–76.8)
RDW: 19.6 % — ABNORMAL HIGH (ref 11.2–14.5)
lymph#: 1.1 10*3/uL (ref 0.9–3.3)

## 2010-10-06 LAB — CBC WITH DIFFERENTIAL/PLATELET
Basophils Absolute: 0.1 10*3/uL (ref 0.0–0.1)
Eosinophils Absolute: 0.3 10*3/uL (ref 0.0–0.5)
HCT: 31.9 % — ABNORMAL LOW (ref 34.8–46.6)
HGB: 10.4 g/dL — ABNORMAL LOW (ref 11.6–15.9)
MCV: 89.1 fL (ref 79.5–101.0)
MONO%: 9.5 % (ref 0.0–14.0)
NEUT#: 4.9 10*3/uL (ref 1.5–6.5)
NEUT%: 69.2 % (ref 38.4–76.8)
Platelets: 248 10*3/uL (ref 145–400)
RDW: 19.6 % — ABNORMAL HIGH (ref 11.2–14.5)

## 2010-11-01 ENCOUNTER — Ambulatory Visit (HOSPITAL_BASED_OUTPATIENT_CLINIC_OR_DEPARTMENT_OTHER): Payer: Medicare Other | Admitting: Oncology

## 2010-11-03 LAB — CBC WITH DIFFERENTIAL/PLATELET
BASO%: 1.2 % (ref 0.0–2.0)
Eosinophils Absolute: 0.3 10*3/uL (ref 0.0–0.5)
HCT: 31.8 % — ABNORMAL LOW (ref 34.8–46.6)
HGB: 10.1 g/dL — ABNORMAL LOW (ref 11.6–15.9)
LYMPH%: 15 % (ref 14.0–49.7)
MONO#: 0.5 10*3/uL (ref 0.1–0.9)
NEUT#: 4.8 10*3/uL (ref 1.5–6.5)
NEUT%: 72.4 % (ref 38.4–76.8)
Platelets: 267 10*3/uL (ref 145–400)
WBC: 6.6 10*3/uL (ref 3.9–10.3)
lymph#: 1 10*3/uL (ref 0.9–3.3)

## 2010-12-01 LAB — CBC WITH DIFFERENTIAL/PLATELET
BASO%: 1.2 % (ref 0.0–2.0)
Basophils Absolute: 0.1 10*3/uL (ref 0.0–0.1)
EOS%: 4.4 % (ref 0.0–7.0)
Eosinophils Absolute: 0.3 10*3/uL (ref 0.0–0.5)
HCT: 30.5 % — ABNORMAL LOW (ref 34.8–46.6)
HGB: 9.8 g/dL — ABNORMAL LOW (ref 11.6–15.9)
LYMPH%: 17.9 % (ref 14.0–49.7)
MCH: 29.1 pg (ref 25.1–34.0)
MCHC: 32.1 g/dL (ref 31.5–36.0)
MCV: 90.5 fL (ref 79.5–101.0)
MONO#: 0.5 10*3/uL (ref 0.1–0.9)
MONO%: 7.8 % (ref 0.0–14.0)
NEUT#: 4.1 10*3/uL (ref 1.5–6.5)
NEUT%: 68.7 % (ref 38.4–76.8)
Platelets: 261 10*3/uL (ref 145–400)
RBC: 3.37 10*6/uL — ABNORMAL LOW (ref 3.70–5.45)
RDW: 19.4 % — ABNORMAL HIGH (ref 11.2–14.5)
WBC: 5.9 10*3/uL (ref 3.9–10.3)
lymph#: 1.1 10*3/uL (ref 0.9–3.3)
nRBC: 0 % (ref 0–0)

## 2010-12-17 ENCOUNTER — Encounter (HOSPITAL_BASED_OUTPATIENT_CLINIC_OR_DEPARTMENT_OTHER): Payer: Self-pay | Admitting: Internal Medicine

## 2010-12-26 NOTE — Letter (Signed)
Summary: [facility]  [facility]   Imported By: Sherian Rein 10/10/2010 09:02:27  _____________________________________________________________________  External Attachment:    Type:   Image     Comment:   External Document

## 2010-12-29 ENCOUNTER — Ambulatory Visit (HOSPITAL_BASED_OUTPATIENT_CLINIC_OR_DEPARTMENT_OTHER): Payer: Medicare Other | Admitting: Oncology

## 2010-12-29 DIAGNOSIS — D469 Myelodysplastic syndrome, unspecified: Secondary | ICD-10-CM

## 2010-12-29 DIAGNOSIS — E039 Hypothyroidism, unspecified: Secondary | ICD-10-CM

## 2010-12-29 LAB — CBC WITH DIFFERENTIAL/PLATELET
Basophils Absolute: 0 10*3/uL (ref 0.0–0.1)
EOS%: 3.5 % (ref 0.0–7.0)
Eosinophils Absolute: 0.2 10*3/uL (ref 0.0–0.5)
HCT: 32 % — ABNORMAL LOW (ref 34.8–46.6)
HGB: 10.2 g/dL — ABNORMAL LOW (ref 11.6–15.9)
MCH: 29.2 pg (ref 25.1–34.0)
MCV: 91.7 fL (ref 79.5–101.0)
MONO%: 6.7 % (ref 0.0–14.0)
NEUT#: 4.5 10*3/uL (ref 1.5–6.5)
NEUT%: 72.5 % (ref 38.4–76.8)
RDW: 19.6 % — ABNORMAL HIGH (ref 11.2–14.5)
lymph#: 1 10*3/uL (ref 0.9–3.3)

## 2011-01-03 ENCOUNTER — Inpatient Hospital Stay (HOSPITAL_BASED_OUTPATIENT_CLINIC_OR_DEPARTMENT_OTHER): Admission: RE | Admit: 2011-01-03 | Payer: Medicare Other | Source: Ambulatory Visit

## 2011-01-03 DIAGNOSIS — I369 Nonrheumatic tricuspid valve disorder, unspecified: Secondary | ICD-10-CM

## 2011-01-04 ENCOUNTER — Ambulatory Visit (HOSPITAL_COMMUNITY)
Admission: RE | Admit: 2011-01-04 | Discharge: 2011-01-04 | Disposition: A | Discharge status: Home / Self Care | Payer: Medicare Other | Source: Ambulatory Visit | Attending: Emergency Medicine | Admitting: Emergency Medicine

## 2011-01-04 DIAGNOSIS — I379 Nonrheumatic pulmonary valve disorder, unspecified: Secondary | ICD-10-CM | POA: Insufficient documentation

## 2011-01-04 DIAGNOSIS — I079 Rheumatic tricuspid valve disease, unspecified: Secondary | ICD-10-CM | POA: Insufficient documentation

## 2011-01-04 DIAGNOSIS — I080 Rheumatic disorders of both mitral and aortic valves: Secondary | ICD-10-CM | POA: Insufficient documentation

## 2011-01-26 ENCOUNTER — Other Ambulatory Visit: Payer: Self-pay | Admitting: Oncology

## 2011-01-26 ENCOUNTER — Encounter (HOSPITAL_BASED_OUTPATIENT_CLINIC_OR_DEPARTMENT_OTHER): Payer: Medicare Other | Admitting: Oncology

## 2011-01-26 DIAGNOSIS — E039 Hypothyroidism, unspecified: Secondary | ICD-10-CM

## 2011-01-26 LAB — CBC WITH DIFFERENTIAL/PLATELET
Basophils Absolute: 0.1 10*3/uL (ref 0.0–0.1)
EOS%: 4.5 % (ref 0.0–7.0)
Eosinophils Absolute: 0.3 10*3/uL (ref 0.0–0.5)
LYMPH%: 19.5 % (ref 14.0–49.7)
MCH: 29.3 pg (ref 25.1–34.0)
MCV: 90.7 fL (ref 79.5–101.0)
MONO%: 7.1 % (ref 0.0–14.0)
NEUT#: 4.5 10*3/uL (ref 1.5–6.5)
Platelets: 260 10*3/uL (ref 145–400)
RBC: 3.76 10*6/uL (ref 3.70–5.45)

## 2011-02-02 ENCOUNTER — Encounter: Payer: Medicare Other | Admitting: Oncology

## 2011-02-06 ENCOUNTER — Other Ambulatory Visit: Payer: Self-pay | Admitting: Oncology

## 2011-02-06 ENCOUNTER — Encounter (HOSPITAL_BASED_OUTPATIENT_CLINIC_OR_DEPARTMENT_OTHER): Payer: Medicare Other | Admitting: Oncology

## 2011-02-06 DIAGNOSIS — D472 Monoclonal gammopathy: Secondary | ICD-10-CM

## 2011-02-06 DIAGNOSIS — E039 Hypothyroidism, unspecified: Secondary | ICD-10-CM

## 2011-02-06 DIAGNOSIS — D649 Anemia, unspecified: Secondary | ICD-10-CM

## 2011-02-06 DIAGNOSIS — D469 Myelodysplastic syndrome, unspecified: Secondary | ICD-10-CM

## 2011-02-06 LAB — CBC WITH DIFFERENTIAL/PLATELET
BASO%: 0.1 % (ref 0.0–2.0)
EOS%: 3.5 % (ref 0.0–7.0)
LYMPH%: 13.2 % — ABNORMAL LOW (ref 14.0–49.7)
MCHC: 33.8 g/dL (ref 31.5–36.0)
MCV: 89.5 fL (ref 79.5–101.0)
MONO%: 3.5 % (ref 0.0–14.0)
Platelets: 228 10*3/uL (ref 145–400)
RBC: 3.53 10*6/uL — ABNORMAL LOW (ref 3.70–5.45)

## 2011-02-23 ENCOUNTER — Other Ambulatory Visit: Payer: Self-pay | Admitting: Oncology

## 2011-02-23 ENCOUNTER — Encounter (HOSPITAL_BASED_OUTPATIENT_CLINIC_OR_DEPARTMENT_OTHER): Payer: Medicare Other | Admitting: Oncology

## 2011-02-23 DIAGNOSIS — E039 Hypothyroidism, unspecified: Secondary | ICD-10-CM

## 2011-02-23 DIAGNOSIS — D472 Monoclonal gammopathy: Secondary | ICD-10-CM

## 2011-02-23 DIAGNOSIS — D469 Myelodysplastic syndrome, unspecified: Secondary | ICD-10-CM

## 2011-02-23 LAB — CBC & DIFF AND RETIC
BASO%: 0.7 % (ref 0.0–2.0)
Immature Retic Fract: 6 % (ref 0.00–10.70)
LYMPH%: 18.3 % (ref 14.0–49.7)
MCHC: 32.2 g/dL (ref 31.5–36.0)
MONO#: 0.4 10*3/uL (ref 0.1–0.9)
RBC: 3.56 10*6/uL — ABNORMAL LOW (ref 3.70–5.45)
RDW: 18.8 % — ABNORMAL HIGH (ref 11.2–14.5)
Retic %: 1.51 % — ABNORMAL HIGH (ref 0.50–1.50)
WBC: 5.8 10*3/uL (ref 3.9–10.3)
lymph#: 1.1 10*3/uL (ref 0.9–3.3)

## 2011-02-23 LAB — MORPHOLOGY

## 2011-02-23 LAB — COMPREHENSIVE METABOLIC PANEL
AST: 28 U/L (ref 0–37)
Albumin: 3.7 g/dL (ref 3.5–5.2)
BUN: 23 mg/dL (ref 6–23)
Calcium: 9.3 mg/dL (ref 8.4–10.5)
Chloride: 105 mEq/L (ref 96–112)
Potassium: 3.9 mEq/L (ref 3.5–5.3)

## 2011-02-26 LAB — IRON AND TIBC
%SAT: 16 % — ABNORMAL LOW (ref 20–55)
Iron: 42 ug/dL (ref 42–145)

## 2011-02-26 LAB — ERYTHROPOIETIN: Erythropoietin: 74.7 m[IU]/mL — ABNORMAL HIGH (ref 2.6–34.0)

## 2011-03-05 LAB — CROSSMATCH

## 2011-03-06 LAB — CROSSMATCH
ABO/RH(D): A POS
Antibody Screen: POSITIVE
DAT, IgG: NEGATIVE

## 2011-03-07 LAB — CROSSMATCH
ABO/RH(D): A POS
ABO/RH(D): A POS
Antibody Screen: POSITIVE
Antibody Screen: POSITIVE
DAT, IgG: NEGATIVE

## 2011-03-07 LAB — ABO/RH: ABO/RH(D): A POS

## 2011-03-08 LAB — CARDIAC PANEL(CRET KIN+CKTOT+MB+TROPI)
Relative Index: INVALID (ref 0.0–2.5)
Relative Index: INVALID (ref 0.0–2.5)
Troponin I: 0.01 ng/mL (ref 0.00–0.06)
Troponin I: 0.02 ng/mL (ref 0.00–0.06)

## 2011-03-08 LAB — PROTIME-INR
INR: 1.2 (ref 0.00–1.49)
INR: 1.3 (ref 0.00–1.49)
INR: 1.4 (ref 0.00–1.49)
INR: 1.5 (ref 0.00–1.49)
Prothrombin Time: 15.3 seconds — ABNORMAL HIGH (ref 11.6–15.2)
Prothrombin Time: 16.4 seconds — ABNORMAL HIGH (ref 11.6–15.2)
Prothrombin Time: 17.8 seconds — ABNORMAL HIGH (ref 11.6–15.2)

## 2011-03-08 LAB — RETICULOCYTES
RBC.: 2.59 MIL/uL — ABNORMAL LOW (ref 3.87–5.11)
Retic Ct Pct: 2.5 % (ref 0.4–3.1)

## 2011-03-08 LAB — CBC
HCT: 21 % — ABNORMAL LOW (ref 36.0–46.0)
HCT: 26.3 % — ABNORMAL LOW (ref 36.0–46.0)
HCT: 27.4 % — ABNORMAL LOW (ref 36.0–46.0)
HCT: 29.1 % — ABNORMAL LOW (ref 36.0–46.0)
HCT: 32.3 % — ABNORMAL LOW (ref 36.0–46.0)
Hemoglobin: 8.8 g/dL — ABNORMAL LOW (ref 12.0–15.0)
Hemoglobin: 9.8 g/dL — ABNORMAL LOW (ref 12.0–15.0)
MCHC: 34.2 g/dL (ref 30.0–36.0)
MCHC: 34.9 g/dL (ref 30.0–36.0)
MCHC: 35.2 g/dL (ref 30.0–36.0)
MCV: 83.9 fL (ref 78.0–100.0)
MCV: 85.8 fL (ref 78.0–100.0)
MCV: 87.1 fL (ref 78.0–100.0)
Platelets: 296 10*3/uL (ref 150–400)
Platelets: 316 10*3/uL (ref 150–400)
Platelets: 322 10*3/uL (ref 150–400)
Platelets: 347 10*3/uL (ref 150–400)
RBC: 2.99 MIL/uL — ABNORMAL LOW (ref 3.87–5.11)
RBC: 3.09 MIL/uL — ABNORMAL LOW (ref 3.87–5.11)
RDW: 19.8 % — ABNORMAL HIGH (ref 11.5–15.5)
RDW: 20.4 % — ABNORMAL HIGH (ref 11.5–15.5)
RDW: 21.2 % — ABNORMAL HIGH (ref 11.5–15.5)
WBC: 5.2 10*3/uL (ref 4.0–10.5)
WBC: 6.6 10*3/uL (ref 4.0–10.5)
WBC: 6.9 10*3/uL (ref 4.0–10.5)
WBC: 7 10*3/uL (ref 4.0–10.5)
WBC: 7.1 10*3/uL (ref 4.0–10.5)

## 2011-03-08 LAB — CROSSMATCH
ABO/RH(D): A POS
ABO/RH(D): A POS
Antibody Screen: POSITIVE
Antibody Screen: POSITIVE
DAT, IgG: NEGATIVE

## 2011-03-08 LAB — HEMOGLOBIN A1C
Hgb A1c MFr Bld: 5.8 % (ref 4.6–6.1)
Mean Plasma Glucose: 120 mg/dL

## 2011-03-08 LAB — DIFFERENTIAL
Basophils Absolute: 0.1 10*3/uL (ref 0.0–0.1)
Lymphocytes Relative: 15 % (ref 12–46)
Lymphs Abs: 0.9 10*3/uL (ref 0.7–4.0)
Monocytes Absolute: 0.5 10*3/uL (ref 0.1–1.0)
Monocytes Absolute: 0.6 10*3/uL (ref 0.1–1.0)
Monocytes Relative: 10 % (ref 3–12)
Monocytes Relative: 9 % (ref 3–12)
Neutro Abs: 3.5 10*3/uL (ref 1.7–7.7)
Neutro Abs: 5.1 10*3/uL (ref 1.7–7.7)

## 2011-03-08 LAB — HEPARIN LEVEL (UNFRACTIONATED)
Heparin Unfractionated: 0.39 IU/mL (ref 0.30–0.70)
Heparin Unfractionated: 0.66 IU/mL (ref 0.30–0.70)
Heparin Unfractionated: <0.1 IU/mL — ABNORMAL LOW (ref 0.30–0.70)
Heparin Unfractionated: <0.1 IU/mL — ABNORMAL LOW (ref 0.30–0.70)

## 2011-03-08 LAB — COMPREHENSIVE METABOLIC PANEL
AST: 14 U/L (ref 0–37)
Albumin: 2.8 g/dL — ABNORMAL LOW (ref 3.5–5.2)
BUN: 11 mg/dL (ref 6–23)
Calcium: 8.5 mg/dL (ref 8.4–10.5)
Chloride: 101 mEq/L (ref 96–112)
Creatinine, Ser: 0.8 mg/dL (ref 0.4–1.2)
GFR calc Af Amer: >60 mL/min (ref >60)
Total Protein: 6 g/dL (ref 6.0–8.3)

## 2011-03-08 LAB — BASIC METABOLIC PANEL
BUN: 12 mg/dL (ref 6–23)
Calcium: 8.6 mg/dL (ref 8.4–10.5)
Chloride: 101 mEq/L (ref 96–112)
GFR calc Af Amer: >60 mL/min (ref >60)
GFR calc non Af Amer: >60 mL/min (ref >60)
Potassium: 3.9 mEq/L (ref 3.5–5.1)
Sodium: 135 mEq/L (ref 135–145)

## 2011-03-08 LAB — LIPID PANEL
Cholesterol: 90 mg/dL (ref 0–200)
HDL: 53 mg/dL (ref >39)
Total CHOL/HDL Ratio: 1.7 RATIO

## 2011-03-08 LAB — SAVE SMEAR

## 2011-03-08 LAB — CK TOTAL AND CKMB (NOT AT ARMC)
CK, MB: 1.4 ng/mL (ref 0.3–4.0)
Total CK: 41 U/L (ref 7–177)

## 2011-03-08 LAB — TSH
TSH: 2.514 u[IU]/mL (ref 0.350–4.500)
TSH: 3.043 u[IU]/mL (ref 0.350–4.500)

## 2011-03-08 LAB — BRAIN NATRIURETIC PEPTIDE: Pro B Natriuretic peptide (BNP): 331 pg/mL — ABNORMAL HIGH (ref 0.0–100.0)

## 2011-03-08 LAB — CHROMOSOME ANALYSIS, BONE MARROW

## 2011-03-08 LAB — APTT: aPTT: 30 seconds (ref 24–37)

## 2011-03-08 LAB — BONE MARROW EXAM

## 2011-03-23 ENCOUNTER — Encounter (HOSPITAL_BASED_OUTPATIENT_CLINIC_OR_DEPARTMENT_OTHER): Payer: Medicare Other | Admitting: Oncology

## 2011-03-23 ENCOUNTER — Other Ambulatory Visit: Payer: Self-pay | Admitting: Oncology

## 2011-03-23 DIAGNOSIS — E039 Hypothyroidism, unspecified: Secondary | ICD-10-CM

## 2011-03-23 DIAGNOSIS — D469 Myelodysplastic syndrome, unspecified: Secondary | ICD-10-CM

## 2011-03-23 DIAGNOSIS — D649 Anemia, unspecified: Secondary | ICD-10-CM

## 2011-03-23 DIAGNOSIS — D472 Monoclonal gammopathy: Secondary | ICD-10-CM

## 2011-03-23 LAB — CBC WITH DIFFERENTIAL/PLATELET
BASO%: 1.2 % (ref 0.0–2.0)
Basophils Absolute: 0.1 10*3/uL (ref 0.0–0.1)
EOS%: 2.5 % (ref 0.0–7.0)
HGB: 10.9 g/dL — ABNORMAL LOW (ref 11.6–15.9)
MCH: 29.3 pg (ref 25.1–34.0)
MCHC: 32.4 g/dL (ref 31.5–36.0)
MCV: 90.3 fL (ref 79.5–101.0)
MONO%: 8.3 % (ref 0.0–14.0)
RBC: 3.72 10*6/uL (ref 3.70–5.45)
RDW: 18.5 % — ABNORMAL HIGH (ref 11.2–14.5)
lymph#: 1 10*3/uL (ref 0.9–3.3)
nRBC: 0 % (ref 0–0)

## 2011-04-10 NOTE — Discharge Summary (Signed)
NAME:  Gina Wilkinson, JEMMOTT              ACCOUNT NO.:  [id number]   MEDICAL RECORD NO.:  [id number]          PATIENT TYPE:  INP   LOCATION:  2011                         FACILITY:  [facility]   PHYSICIAN:  Doylene Canning. Ladona Ridgel, MD    DATE OF BIRTH:  [birth year]   DATE OF ADMISSION:  01/27/2009  DATE OF DISCHARGE:  02/03/2009                               DISCHARGE SUMMARY   ALLERGIES:  She has allergies to PENICILLIN, SULFA, CODEINE, and  MORPHINE SULFATE.   FINAL DIAGNOSES:  1. Admit with bilateral lower extremity edema.      a.     Venous Dopplers on January 27, 2009, no deep vein thrombosis,       no superficial thrombophlebitis, or Baker cyst.  2. Atrial fibrillation, rapid ventricular rate on admission.      a.     Rate controlled, presently with diltiazem and metoprolol.  3. Marked anemia on presentation, hemoglobin on January 28, 2009 was 7.2.  4. The patient has received 4 units of packed red blood cells in this      hospitalization.  She has also had both Hematology and      Gastroenterology consults.  Hemoglobin at discharge, February 03, 2009      is 11.4.  5. Electrocardiogram shows right bundle-branch block/atrial      fibrillation.  6. Echocardiogram, January 28, 2009, ejection fraction of 55-65% mild      mitral regurgitation, mild-to-moderate tricuspid regurgitation.   SECONDARY DIAGNOSES:  1. Treated hypothyroidism.  2. Status post right total hip arthroplasty.   HEMATOLOGY CONSULT:  Consult for anemia, Dr. Myna Hidalgo.  1. Normochromic/normocytic anemia.  2. It is not hemolytic.  3. Low reticulocyte count/abnormal, malfunction ?  4. Possible iron deficiency.  5. Possible myelodysplasia.  6. No evidence on a blood smear for acute or chronic leukemia.  7. Erythropoietin count 69.9 (2.6-34 is arranged).  8. Bone marrow biopsy, February 01, 2009, trilineage hematopoiesis.      Normal maturation and morphology of all myeloid series.  No      increase in plasma cells or lymphocytes.  Relative  increase in      erythroid elements.  No fibrosis is noted.  She also had a GI      consult.  She had both EGD/colonoscopy on February 01, 2009.  Her EGD      showed a stricture GE  junction with friable mucosa on a gastric      side of the GE junction.  We will start a proton pump inhibitor.      The colonoscopy showed hyperplastic polyps in the transverse colon      , 3 in the sigmoid colon, 1 friable and bleeding, 1 sessile polyp      in the sigmoid colon, and 1 sessile polyp in the rectum, and also      had marked sigmoid diverticulosis.  The pathology on all of these      show that they are hyperplastic, no high-grade dysplasia and no      malignancy.  Once again 1 polyp in the sigmoid colon was friable  and actively bleeding.  This has been corrected at colonoscopy.   PROCEDURES THIS ADMISSION:  1. Venous Dopplers study, January 27, 2009, showing no DVT in the right      lower extremity, which has been swollen on admission.  2. A 2-D echocardiogram, January 28, 2009, ejection fraction of 55-65%,      mild mitral regurgitation,  mild-to-moderate tricuspid      regurgitation.  3. EGD, February 01, 2009, stricture at the GE junction, friable mucosa on      the gastric side of the GE junction.  4. Colonoscopy, February 01, 2009, polyps as mentioned above.  They are      all hyperplastic, no high-grade dysplasia.  No malignancy, 1 in the      sigmoid colon was friable and bleeding.  This would help with her      anemia and now that it has been corrected at colonoscopy.  5. Bone marrow biopsy.  As dictated above, trilineage hematopoiesis,      normal maturation and morphology of the myeloid series, no      fibrosis.   BRIEF HISTORY:  Ms. Gina Wilkinson is an 77 year old female.  She lives  independently.  Two weeks ago prior to this admission, she noticed  bilateral lower extremity edema, the right leg was more swollen than the  left.  The patient denies chest pain, shortness of breath.  She has no   significant dyspnea on exertion.  She is not experiencing tachy  palpitations, orthopnea, cough, nausea, vomiting, fevers, or chills.  She is not having diarrhea or any bleeding per the patient.   She went see Dr. Quintella Reichert, her primary care physician to have lower  extremity edema evaluated.  At the office, electrocardiogram showed  atrial fibrillation with rapid ventricular rate and she was sent to the  emergency room for further evaluation.  At the emergency room, the  patient's EKG shows right bundle branch, no ST-T changes.  Her  hemoglobin is surprising, it is 7.2, a marked decrease.  She will have  an anemia workup here at [facility].  Her cardiac enzymes will  be cycled.  There will be a venous Doppler study to rule out DVT on the  right leg, which is swollen.  She will have her atrial fibrillation rate  controlled and possibly cardioverted.  She will also have a 2-D  echocardiogram.   HOSPITAL COURSE:  The patient was admitted with right lower extremity  edema and also atrial fibrillation, rapid ventricular rate, and anemia.  All of these problems have been attacked at this  hospitalization.  Once  again, she has had a venous Doppler study, which shows no evidence of  DVT.  A 2-D echocardiogram shows ejection fraction 55-65% with mild  mitral regurgitation and typical  tricuspid regurgitation.  Troponin I  studies are all negative.  She has been seen by Hematology.  She has had  a bone marrow biopsy, which shows that her cells lines are maturing well  and there is no evidence of malignancy or fibrosis.  She has had GI  evaluation with esophagogastroduodenoscopy, which shows friable mucosa  on the gastric side of GE junction, which is slightly strictured.  We  will start a proton pump inhibitor for this.  She also had a colonoscopy  that showed several polyps, all of them were snare biopsied and they  were all hyperplastic without high-grade dysplasia or malignancy.  One   of the sigmoid polyps was friable and  bleeding and could be contributing  to her anemia.  The patient did require 2 units of packed red blood  cells for her hemoglobin of 7.2 on January 28, 2009, and then again on  February 02, 2009, for hemoglobin 8.8.  she required further 2 units.  On  the day of discharge, her hemoglobin was 11.4, and we hope that she  maintains a fairly stable hemoglobin, but she will have close followup  of complete blood count as an outpatient.  She was discharged on February 03, 2009.  Her atrial fibrillation is rate controlled.  In the further,  perhaps anticoagulation, perhaps cardioversion would be considered.   DISCHARGE MEDICATIONS:  1. Enteric-coated aspirin 81 mg daily.  2. Protonix, no new medication 40 mg daily.  3. Diltiazem 120 mg daily.  4. Metoprolol 25 mg twice daily.  5. Ferrous sulfate 325 mg every other day.  6. Colace 100 mg 2 tablets daily at bedtime.  7. Synthroid 88 mcg daily.   She is to follow up complete blood count the week of February 07, 2009, at  [facility], she will see Dr. Ladona Ridgel on February 16, 2009, at 4:15  and once again, she will have a followup with Dr. Myna Hidalgo.  His office  will call to make an appointment for 2 weeks.   LABORATORY STUDIES:  Erythropoietin level 69.9, TSH at this admission  was 3.043.  Her admission hemoglobin 7.2, discharge hemoglobin February 03, 2009 was 11.4 after 4 units of packed red blood cells.  Her basic  metabolic panel on February 02, 2009, sodium 135, potassium 4.3, chloride  101, carbonate 28, BUN 9, creatinine 0.88, glucose 99.  Complete blood  count on the day of discharge, white cells 6.6, hemoglobin 11.4,  hematocrit 32.3, and platelets were 296.  Protime at this admission on  February 03, 2009 is 16.4.  INR is 1.3.  Alkaline phosphatase on this  admission 51.  SGOT is 14, SGPT is 9.  Troponin I study less than 0.01,  then 0.01, and 0.02.  HGB A1c was 5.8.      Maple Mirza, PA      Doylene Canning. Ladona Ridgel, MD  Electronically Signed    GM/MEDQ  D:  02/03/2009  T:  02/04/2009  Job:  [id number]   cc:   Doylene Canning. Ladona Ridgel, MD  Titus Dubin. Alwyn Ren, MD,FACP,FCCP

## 2011-04-10 NOTE — Consult Note (Signed)
NAME:  Gina Wilkinson, Gina Wilkinson              ACCOUNT NO.:  [id number]   MEDICAL RECORD NO.:  [id number]          PATIENT TYPE:  INP   LOCATION:  2011                         FACILITY:  [facility]   PHYSICIAN:  Rose Phi. Myna Hidalgo, M.D. DATE OF BIRTH:  05-May-1920   DATE OF CONSULTATION:  01/28/2009  DATE OF DISCHARGE:                                 CONSULTATION   REFERRING PHYSICIAN:  Noralyn Pick. Eden Emms, MD, FACC   REASON FOR CONSULTATION:  1. Severe normochromic normocytic anemia.  2. Rapid AFib.   HISTORY OF PRESENT ILLNESS:  Gina Wilkinson is a very charming 77 year old  white female with history of hypothyroidism.  She has been told that she  has had anemia all my life.   She apparently has had anemia back with a hip surgery.  She recalls that  was last time she had a blood transfusion.   She has never noted any obvious bleeding.  There has been no melena or  bright red blood per rectum.   She does state about 2-3 weeks ago that she had bad cold.  She was given  Levaquin for this.   Back in October 2009, it was noted that her hemoglobin was 11.1.   She was admitted on January 27, 2009, because of swelling in the legs.  She  had Dopplers, which were negative.  When she was admitted, she was noted  to be anemic.  Her hemoglobin was 8.3.   Today, she had a CBC which showed a white cell count of 5.2, hemoglobin  7.2, hematocrit 21, and platelet count 322.  She had normal MCV.   She did have some studies done.  Her iron was only 20.  Her B12 was  normal at 421.  She had a retic count that when corrected for her  hemoglobin was about 1%.  She had a normal TSH.  She did have a mildly  elevated BNP on admission.   She has been seen by GI.  She did not have any obvious GI bleeding, but  she has never had a colonoscopy.   We were subsequently asked to see her by Dr. Eden Emms to try to help with  the anemia.   PAST MEDICAL HISTORY:  Remarkable for:  1. Hypothyroidism.  2. History of right hip  replacement.   ALLERGIES:  1. PENICILLIN.  2. CODEINE  3. MORPHINE.   ADMISSION MEDICATIONS:  1. Aspirin 81 mg p.o. daily.  2. Lasix 20 mg daily.  3. Synthroid 0.075 mg daily.  4. In the hospital, she has been on Cardizem drip.   SOCIAL HISTORY:  Negative for tobacco or alcohol use.  She has no  obvious occupational exposures.  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.  It is hard to say there is any real  occupational exposures at this time with organic compounds.   FAMILY HISTORY:  Negative for any obvious hematologic or oncologic  issues.   REVIEW OF SYSTEMS:  As stated in the history of present illness.  No  additional findings are noted.   PHYSICAL EXAMINATION:  GENERAL:  This is a quite vigorous-appearing  white female, who certainly looks little bit younger than her stated  age.  She is alert and oriented x3.  VITAL SIGNS:  97.3, pulse 83, respiratory rate 16, and blood pressure is  102/59.  HEAD:  Normocephalic and atraumatic skull.  There are no ocular or oral  lesions.  She has no scleral icterus.  Conjunctivae are slightly pale.  She has no glossitis.  NECK:  Supple with no adenopathy.  Thyroid is not palpable.  LUNGS:  Some decreased breath sounds at the bases.  CARDIAC:  Irregular rate and irregular rhythm consistent with atrial  fibrillation.  There are no murmurs, rubs, or bruits.  ABDOMEN:  Soft.  She has good bowel sounds.  There is no guarding or  rebound tenderness.  There is no palpable hepatosplenomegaly.  EXTREMITIES:  Some edema in the lower legs.  There may be a little bit  more edema in the right leg than left.  SKIN:  No rashes, ecchymoses, or petechiae.  NEUROLOGIC:  No obvious neurological deficits.   Her chest x-ray on admission showed moderate-to-severe cardiac  enlargement with some evidence of pulmonary edema.   Her peripheral blood smear shows mild anisocytosis.  I believe that she  had been transfused prior to  the blood smear being obtained.  She did  have some significant microcytic and hypochromic red cells.  There is no  nucleated red blood cells.  I saw no teardrop cells.  There is no  rouleaux formation.  There were couple of target cells.  White cells  were normal in morphology and maturation.  There were no hypersegmented  polys.  I did not see any immature myeloid or lymphoid forms.  There  were no atypical lymphocytes.  There were no blasts.  Platelets were  adequate in number and size.   IMPRESSION:  Ms. Gina Wilkinson is a charming 77 year old white female with  anemia.  This is a significant normochromic normocytic anemia.   It is certainly unclear as to what the etiology is.  It could be  multifactorial.  I certainly do not believe that this is hemolytic  anemia.  Her reticulocyte count is quite low, so one would have to  assume that her marrow is malfunctioning.   Given her iron of 120, one has to wonder about iron deficiency.  She has  no obvious bleeding from what she says by history.   Myelodysplasia was a possibility.  Given her age, one could postulate  that she may have the 5Q minus syndrome, which certainly would be  favorable for her.  I do not see anything on her blood smear that would  suggest any acute or chronic leukemia.  I do not see anything that would  suggest lymphoma or myeloma.  I do not see any abnormalities on the  blood smear that would point to any kind of infiltrate of myelofibrosis.   I do believe that a bone marrow is going to have to be done on Ms.  Rout.  I think this is going to be the best way for [place] to try to sort  out what might be the source of the anemia and then be able to treat her  appropriately.   I think if she does have iron deficiency, then colonoscopy clearly would  be indicated to evaluate for a potential source of bleeding.   I agree with the transfusion that she is getting right now.   We will send off an  erythropoietin level on her.   Although her renal  function is adequate, one needs to see what her erythropoietin level is.  If she has a low endogenous erythropoietin level, then she may benefit  from Procrit or Aranesp in the future.   I spoke to Ms. Boehm and her daughter at length.  We had good  fellowship.  They go ahead and they agree with my recommendations with  regards to the bone marrow test.   We will see about setting the bone marrow test up for Tuesday.   Of note, she did have a echocardiogram done, which showed excellent  ejection fraction.  Valves appeared slightly thickened.  Aortic valve  was moderately calcified.  Again, I do not see any evidence to suggest a  microangiopathic hemolysis from valvular problems.   We will certainly follow along.  I do not see any problems with her  being on anticoagulation from a hematologic point of view.  We certainly  want to try to keep her hemoglobin above 10, which would certainly help  with congestive heart failure issues.       Rose Phi. Myna Hidalgo, M.D.  Electronically Signed     PRE/MEDQ  D:  01/28/2009  T:  01/30/2009  Job:  [id number]   cc:   Noralyn Pick. Eden Emms, MD, [facility]  Titus Dubin. Alwyn Ren, MD,FACP,FCCP  Hedwig Morton. Juanda Chance, MD

## 2011-04-10 NOTE — Assessment & Plan Note (Signed)
[facility]                         ELECTROPHYSIOLOGY OFFICE NOTE   TYNLEIGH, BIRT                     MRN:          [id number]  DATE:02/16/2009                            DOB:          10-Apr-1920    Ms. Gina Wilkinson returns today for followup.  She is a very pleasant elderly  woman with a history of atrial fibrillation, history of anemia of  unclear etiology, though likely GI in nature, also with a history of  hypothyroidism who returns today for followup.  The patient was in  hospital recently with atrial fibrillation and rapid ventricular  response associated with diastolic heart failure which was clearly  exacerbated by her anemia.  After evaluation, she was transfused.  She  has undergone workup with colonoscopy which demonstrated polyps, one of  which was seen to be bleeding.  She also has undergone bone marrow  biopsy which demonstrated hypercellular bone marrow with trilineage  hematopoiesis and a relative increase in erythroid elements.  The  clinical interpretation of this is unclear to me at the present time.  She returns today for followup and overall feels better.  She does get a  little dizzy when she stands up or tries to go too quickly and she gets  dyspneic when she does any strenuous activity.  Otherwise, there are no  additional symptoms.  She returns today for additional evaluation.   CURRENT MEDICATIONS:  1. Metoprolol 25 mg twice daily.  2. Aspirin 81 mg daily.  3. Protonix 40 a day.  4. Diltiazem 120 a day.  5. Iron 325 a day.  6. Synthroid 88 mcg daily.   PHYSICAL EXAMINATION:  GENERAL:  She is a pleasant elderly woman in no  distress.  VITAL SIGNS:  Blood pressure today was 129/71, the pulse was 75 and  irregular, respirations were 18, and weight was 153 pounds.  NECK:  No jugular venous distention.  LUNGS:  Clear bilaterally to auscultation.  No wheezes, rales, or  rhonchi were present.  There was no increased work of  breathing.  CARDIOVASCULAR:  Irregularly irregular rhythm.  Normal S1 and S2.  ABDOMEN:  Soft and nontender.  EXTREMITIES:  No peripheral edema.   IMPRESSION:  1. Atrial fibrillation with controlled ventricular response.  2. Hypertension, well controlled.  3. Anemia of unclear etiology, now stable.   PLAN:  Today will be to obtain a CBC and she will follow back up with  Dr. Linna Darner who has seen her in the hospital.  I will see the patient back  for her atrial fibrillation in several months.     Doylene Canning. Ladona Ridgel, MD  Electronically Signed    GWT/MedQ  DD: 02/16/2009  DT: 02/17/2009  Job #: [id number]

## 2011-04-10 NOTE — H&P (Signed)
NAME:  Gina Wilkinson, Gina Wilkinson              ACCOUNT NO.:  [id number]   MEDICAL RECORD NO.:  [id number]          PATIENT TYPE:  INP   LOCATION:  2011                         FACILITY:  [facility]   PHYSICIAN:  Doylene Canning. Ladona Ridgel, MD    DATE OF BIRTH:  1920/04/25   DATE OF ADMISSION:  01/27/2009  DATE OF DISCHARGE:                              HISTORY & PHYSICAL   PRIMARY CARDIOLOGIST:  Verne Carrow, MD   PRIMARY CARE PHYSICIAN:  Titus Dubin. Alwyn Ren, MD, FACP, FCCP   CHIEF COMPLAINT:  Atrial fibrillation with rapid ventricular response.   HISTORY OF PRESENT ILLNESS:  This is an 77 year old female with a  history of hypothyroidism who was in her usual state of health until 2  weeks ago when she noticed bilateral lower extremity edema, right lower  extremity greater than left.  The patient denies chest pain, shortness  of breath, change with significant dyspnea on exertion,  tachypalpitations, PND, orthopnea, cough, nausea, vomiting, or fevers,  and chills within the last 3-4 weeks, diarrhea, any bleeding, or any  changes to her baseline state.  She went to see her primary care  physician today to have her lower extremity edema evaluated and MD noted  atrial fibrillation with RVR and sent her to the Emergency Department at  [facility] for evaluation.   PAST MEDICAL HISTORY:  1. Hypothyroidism.  2. Anemia.  3. Status post total hip arthroplasty in 2001.  Note, the patient has      never had a colonoscopy.   SOCIAL HISTORY:  The patient lives in [place] alone.  She is retired,  although she is very active with daily activities.  She has never  smoked.  She does not drink alcohol.  She has never used illicit drugs.  She takes no herbal medications.  She takes calcium with vitamin D.  She  eats a regular diet and does not regularly exercise.   FAMILY HISTORY:  Mother deceased, ? age 39-60s, ruptured appendix.  Father deceased at an early age, no history of coronary artery disease.  Siblings, no history of coronary artery disease.   REVIEW OF SYSTEMS:  Over the last month, she felt a little bit of  fatigue.  She had an upper respiratory infection a little over a month  ago where she had a productive cough and she retook an antibiotic for  it.  She never quite came back up to her regular state of energy,  although this is per her daughter for the patient she has been feeling  back to baseline.  Other than that, see HPI.  All other systems reviewed  and were negative.   ALLERGIES:  1. PENICILLIN.  2. SULFA.   MEDICATIONS:  1. Synthroid 75 mcg p.o. daily (good compliance reported).  2. Aspirin 81 mg p.o. daily (poor compliance reported).  3. Calcium with vitamin D.   PHYSICAL EXAMINATION:  VITAL SIGNS: Temperature 97.5 degrees Fahrenheit,  BP 154/90 at her primary care doctor's office down to 136/72 in the  emergency department, pulse 102, respiration rate 20, O2 saturation 96%  on room air, weight 159 pounds.  GENERAL:  On exam, she is alert and oriented x3 in no apparent distress.  She could seek easily without respiratory distress.  Says she can speak  and move easily without respiratory distress.  HEENT:  Head was normocephalic, atraumatic.  Pupils were equal, round  and reactive to light.  Extraocular muscles were intact.  Nares were  patent without discharge.  Dentition was good.  Oropharynx without  exudate.  NECK:  Supple without lymphadenopathy.  No thyromegaly noted.  No bruit,  minimal JVD, 8-10 cm.  CARDIAC:  Heart rate irregularly irregular, audible S1 and S2.  No  clicks, rubs, or murmurs.  Pulses were 2+ and equal in both upper and  lower extremities bilaterally.  LUNGS:  Clear to auscultation bilaterally.  SKIN:  No rashes, lesions, or petechiae.  ABDOMEN:  Soft, nontender, nondistended.  Normal abdominal bowel sounds.  No rebound or guarding.  No hepatosplenomegaly and no pulsations.  EXTREMITIES:  No clubbing, cyanosis, or edema.   MUSCULOSKELETAL:  No joint deformity, effusions, no spinal or CVA  tenderness.  NEUROLOGIC:  Cranial nerves II through XII were grossly intact.  Strength was 5/5 in all extremities and axial groups, normal sensation  throughout and normal cerebellar function.  The patient had lower  extremity Dopplers that were negative for all calves DVT.   EKG showed atrial fibrillation with rapid ventricular response with a  rate of 115 beats per minute.  No acute ST-T ST wave changes.  No Q-  waves, right bundle-branch block, QRS 122, QTc 473.  The only change is  from prior EKG performed December 20, 1999 were atrial fibrillation and  the tachycardia.  The patient has a chronic right bundle-branch block.   LABORATORY DATA:  WBC 7.9, hemoglobin 8.3, hematocrit 26.3, PLT count  484.  No other labs available at this time.   IMPRESSION:  This is an 77 year old female with a history of  hypothyroidism who presents with bilateral lower extremity edema right  greater than left, minimal fatigue and heart rhythm is atrial  fibrillation with a rapid ventricular response.  1. New onset atrial fibrillation.  2. Right leg swelling greater than left, negative for deep venous      thrombosis.  3. Anemia.   PLAN:  The check a chest x-ray, check TSH, check a 2-D echocardiogram.  Start Coumadin and IV heparin.  Start IV Cardizem and workup anemia.  The patient will also have a complete metabolic panel, a CBC in the a.m.  Her PT and INR will be check, a BNP will be checked.  Her cardiac  markers will be cycled.  The patient will also continue on her home  medications.      Jarrett Ables, PAC      Doylene Canning. Ladona Ridgel, MD  Electronically Signed    MS/MEDQ  D:  01/27/2009  T:  01/28/2009  Job:  [id number]

## 2011-04-13 NOTE — H&P (Signed)
[place]  Patient:    Gina Wilkinson                       MRN: [id number] Adm. Date:  12/22/99 Dictator:   Doreatha Lew, M.D.                         History and Physical  HISTORY OF PRESENT ILLNESS:  Gina Wilkinson is a 77 year old, otherwise healthy, active female who in leaving from church tonight twisted and felt her left leg go out f her and had immediate inability to walk with left leg pain.  She was evaluated n the emergency room by Dr. Mckinley Jewel and diagnosed with left hip displaced, femoral neck fracture.  We were called for medical consultation and preop evaluation.  Currently, she is complaining of left hip pain, otherwise review of systems is unremarkable.  PAST MEDICAL HISTORY:  Significant for hypothyroidism, which she has been treated for many years with Synthroid, anemia, which has been chronic and stable. Unsure of the etiology.  PAST SURGICAL HISTORY:  Appendectomy in her 29s.  Tonsillectomy as a child.  CURRENT MEDICATIONS: 1. Synthroid at an unknown dose. 2. Ecotrin. 3. Lasix, unknown dose.  She takes a half of a tablet maybe two to three times    a week for lower extremity edema.  ALLERGIES:  PENICILLIN.  SOCIAL HISTORY:  She does not smoke nor drink.  She lives with her husband in [place].  They are very active in church and she is independent in her ADLs.   FAMILY HISTORY:  Noncontributory.  PHYSICAL EXAMINATION:  VITAL SIGNS:  Noted on her chart and usually temperature 97.6, respiratory rate 20, heart rate 86.  blood pressure 193/85, recheck was 142/57.  GENERAL:  She is awake, alert, oriented x 3 in mild discomfort.  HEENT:  PERRLA.  Lids normal.  OP clear.  ______.  NECK:  Supple.  No JVD.  No lymphadenopathy.  No thyromegaly.  CHEST:  Clear to auscultation bilaterally.  CARDIAC:  Regular rate and rhythm without murmur, gallop, or rub.  ABDOMEN:  Soft, nontender, nondistended.  Positive  bowel sounds.  No hepatosplenomegaly.  No masses.  EXTREMITIES:  DP pulses 2+.  No edema.  Left leg short and externally rotated.  Chest x-ray is a supine view but does appear to show cardiomegaly.  The lungs are clear.  ECG shows a right bundle-branch block.  We have no old comparison. Rate is normal at 85.  CMET is normal within normal limits.  PT and PTT are within normal limits. White count is 8.1, hemoglobin low at 9.5, platelets are normal.  Her MCV is normal at 87.3.  RDW is up at 16.2.  Urinalysis does show moderate hemoglobin and protein of 30.  ASSESSMENT AND PLAN:  The patient is a 77 year old active female with a left hip fracture awaiting surgery tomorrow by the orthopedic team.  TSH should be checked in the morning.  Husband to bring in her current medications so we can continue her home dose.  After surgery, need to work-up her anemia.  There is noted to be hematuria on UA, question whether this is traumatic due to the catheterization r a chronic process.  Also need to be very careful with hydration and watch her respiratory status closely as she does appear to have cardiomegaly on chest x-ray and electrocardiogram is abnormal with a right bundle-branch block.  May need to contact  primary care physician, Dr. Alcide Clever in [facility] for further cardiology past medical history.  Will follow along.  Appreciate consult. DD:  12/21/99 TD:  12/21/99 Job: [id number]

## 2011-04-20 ENCOUNTER — Encounter (HOSPITAL_BASED_OUTPATIENT_CLINIC_OR_DEPARTMENT_OTHER): Payer: Medicare Other | Admitting: Oncology

## 2011-04-20 ENCOUNTER — Other Ambulatory Visit: Payer: Self-pay | Admitting: Oncology

## 2011-04-20 DIAGNOSIS — D469 Myelodysplastic syndrome, unspecified: Secondary | ICD-10-CM

## 2011-04-20 DIAGNOSIS — E039 Hypothyroidism, unspecified: Secondary | ICD-10-CM

## 2011-04-20 LAB — CBC WITH DIFFERENTIAL/PLATELET
BASO%: 0.8 % (ref 0.0–2.0)
Eosinophils Absolute: 0.2 10*3/uL (ref 0.0–0.5)
LYMPH%: 13.2 % — ABNORMAL LOW (ref 14.0–49.7)
MCHC: 32 g/dL (ref 31.5–36.0)
MONO#: 0.4 10*3/uL (ref 0.1–0.9)
NEUT#: 4.4 10*3/uL (ref 1.5–6.5)
RBC: 3.53 10*6/uL — ABNORMAL LOW (ref 3.70–5.45)
RDW: 18.4 % — ABNORMAL HIGH (ref 11.2–14.5)
WBC: 5.9 10*3/uL (ref 3.9–10.3)
lymph#: 0.8 10*3/uL — ABNORMAL LOW (ref 0.9–3.3)
nRBC: 0 % (ref 0–0)

## 2011-05-03 ENCOUNTER — Other Ambulatory Visit: Payer: Self-pay | Admitting: Internal Medicine

## 2011-05-18 ENCOUNTER — Encounter (HOSPITAL_BASED_OUTPATIENT_CLINIC_OR_DEPARTMENT_OTHER): Payer: Medicare Other | Admitting: Oncology

## 2011-05-18 ENCOUNTER — Other Ambulatory Visit: Payer: Self-pay | Admitting: Oncology

## 2011-05-18 DIAGNOSIS — E039 Hypothyroidism, unspecified: Secondary | ICD-10-CM

## 2011-05-18 DIAGNOSIS — D472 Monoclonal gammopathy: Secondary | ICD-10-CM

## 2011-05-18 DIAGNOSIS — D649 Anemia, unspecified: Secondary | ICD-10-CM

## 2011-05-18 DIAGNOSIS — D469 Myelodysplastic syndrome, unspecified: Secondary | ICD-10-CM

## 2011-05-18 LAB — CBC WITH DIFFERENTIAL/PLATELET
BASO%: 0.8 % (ref 0.0–2.0)
Basophils Absolute: 0.1 10*3/uL (ref 0.0–0.1)
EOS%: 3.5 % (ref 0.0–7.0)
HCT: 31.7 % — ABNORMAL LOW (ref 34.8–46.6)
HGB: 10.6 g/dL — ABNORMAL LOW (ref 11.6–15.9)
MCH: 30.4 pg (ref 25.1–34.0)
MONO#: 0.5 10*3/uL (ref 0.1–0.9)
NEUT%: 74.7 % (ref 38.4–76.8)
RDW: 19.8 % — ABNORMAL HIGH (ref 11.2–14.5)
WBC: 6.6 10*3/uL (ref 3.9–10.3)
lymph#: 0.9 10*3/uL (ref 0.9–3.3)

## 2011-06-15 ENCOUNTER — Encounter (HOSPITAL_BASED_OUTPATIENT_CLINIC_OR_DEPARTMENT_OTHER): Payer: Medicare Other | Admitting: Oncology

## 2011-06-15 ENCOUNTER — Other Ambulatory Visit: Payer: Self-pay | Admitting: Oncology

## 2011-06-15 DIAGNOSIS — D4620 Refractory anemia with excess of blasts, unspecified: Secondary | ICD-10-CM

## 2011-06-15 DIAGNOSIS — D649 Anemia, unspecified: Secondary | ICD-10-CM

## 2011-06-15 DIAGNOSIS — D469 Myelodysplastic syndrome, unspecified: Secondary | ICD-10-CM

## 2011-06-15 DIAGNOSIS — E039 Hypothyroidism, unspecified: Secondary | ICD-10-CM

## 2011-06-15 DIAGNOSIS — D472 Monoclonal gammopathy: Secondary | ICD-10-CM

## 2011-06-15 LAB — CBC WITH DIFFERENTIAL/PLATELET
Basophils Absolute: 0.1 10*3/uL (ref 0.0–0.1)
Eosinophils Absolute: 0.3 10*3/uL (ref 0.0–0.5)
HCT: 32.2 % — ABNORMAL LOW (ref 34.8–46.6)
HGB: 10.4 g/dL — ABNORMAL LOW (ref 11.6–15.9)
MCV: 89.7 fL (ref 79.5–101.0)
NEUT#: 5.1 10*3/uL (ref 1.5–6.5)
NEUT%: 72.6 % (ref 38.4–76.8)
RDW: 18.8 % — ABNORMAL HIGH (ref 11.2–14.5)
lymph#: 1 10*3/uL (ref 0.9–3.3)

## 2011-06-15 LAB — COMPREHENSIVE METABOLIC PANEL
Albumin: 4.2 g/dL (ref 3.5–5.2)
BUN: 24 mg/dL — ABNORMAL HIGH (ref 6–23)
CO2: 25 mEq/L (ref 19–32)
Calcium: 9.3 mg/dL (ref 8.4–10.5)
Chloride: 100 mEq/L (ref 96–112)
Creatinine, Ser: 1.11 mg/dL — ABNORMAL HIGH (ref 0.50–1.10)
Glucose, Bld: 132 mg/dL — ABNORMAL HIGH (ref 70–99)
Potassium: 4.5 mEq/L (ref 3.5–5.3)

## 2011-06-15 LAB — LACTATE DEHYDROGENASE: LDH: 211 U/L (ref 94–250)

## 2011-07-13 ENCOUNTER — Encounter (HOSPITAL_BASED_OUTPATIENT_CLINIC_OR_DEPARTMENT_OTHER): Payer: Medicare Other | Admitting: Oncology

## 2011-07-13 ENCOUNTER — Other Ambulatory Visit: Payer: Self-pay | Admitting: Oncology

## 2011-07-13 DIAGNOSIS — E039 Hypothyroidism, unspecified: Secondary | ICD-10-CM

## 2011-07-13 DIAGNOSIS — D469 Myelodysplastic syndrome, unspecified: Secondary | ICD-10-CM

## 2011-07-13 LAB — CBC WITH DIFFERENTIAL/PLATELET
Eosinophils Absolute: 0.3 10*3/uL (ref 0.0–0.5)
HGB: 10.6 g/dL — ABNORMAL LOW (ref 11.6–15.9)
MCV: 91.4 fL (ref 79.5–101.0)
MONO#: 0.4 10*3/uL (ref 0.1–0.9)
MONO%: 7.1 % (ref 0.0–14.0)
NEUT#: 4 10*3/uL (ref 1.5–6.5)
RBC: 3.4 10*6/uL — ABNORMAL LOW (ref 3.70–5.45)
RDW: 20.8 % — ABNORMAL HIGH (ref 11.2–14.5)
WBC: 5.5 10*3/uL (ref 3.9–10.3)
lymph#: 0.8 10*3/uL — ABNORMAL LOW (ref 0.9–3.3)

## 2011-08-10 ENCOUNTER — Other Ambulatory Visit: Payer: Self-pay | Admitting: Oncology

## 2011-08-10 ENCOUNTER — Encounter (HOSPITAL_BASED_OUTPATIENT_CLINIC_OR_DEPARTMENT_OTHER): Payer: Medicare Other | Admitting: Oncology

## 2011-08-10 DIAGNOSIS — D472 Monoclonal gammopathy: Secondary | ICD-10-CM

## 2011-08-10 LAB — CBC WITH DIFFERENTIAL/PLATELET
Basophils Absolute: 0.1 10*3/uL (ref 0.0–0.1)
EOS%: 3.9 % (ref 0.0–7.0)
HGB: 11.3 g/dL — ABNORMAL LOW (ref 11.6–15.9)
MCH: 30.1 pg (ref 25.1–34.0)
NEUT#: 4.5 10*3/uL (ref 1.5–6.5)
RDW: 18.3 % — ABNORMAL HIGH (ref 11.2–14.5)
lymph#: 0.9 10*3/uL (ref 0.9–3.3)

## 2011-09-07 ENCOUNTER — Encounter (HOSPITAL_BASED_OUTPATIENT_CLINIC_OR_DEPARTMENT_OTHER): Payer: Medicare Other | Admitting: Oncology

## 2011-09-07 ENCOUNTER — Other Ambulatory Visit: Payer: Self-pay | Admitting: Oncology

## 2011-09-07 DIAGNOSIS — D472 Monoclonal gammopathy: Secondary | ICD-10-CM

## 2011-09-07 LAB — CBC WITH DIFFERENTIAL/PLATELET
Eosinophils Absolute: 0.3 10*3/uL (ref 0.0–0.5)
LYMPH%: 14.7 % (ref 14.0–49.7)
MONO#: 0.5 10*3/uL (ref 0.1–0.9)
NEUT#: 4.2 10*3/uL (ref 1.5–6.5)
Platelets: 273 10*3/uL (ref 145–400)
RBC: 3.65 10*6/uL — ABNORMAL LOW (ref 3.70–5.45)
RDW: 19.8 % — ABNORMAL HIGH (ref 11.2–14.5)
WBC: 5.9 10*3/uL (ref 3.9–10.3)
lymph#: 0.9 10*3/uL (ref 0.9–3.3)

## 2011-10-05 ENCOUNTER — Other Ambulatory Visit (HOSPITAL_BASED_OUTPATIENT_CLINIC_OR_DEPARTMENT_OTHER): Payer: Medicare Other | Admitting: Lab

## 2011-10-05 ENCOUNTER — Ambulatory Visit: Payer: Medicare Other

## 2011-10-05 ENCOUNTER — Other Ambulatory Visit: Payer: Self-pay | Admitting: Oncology

## 2011-10-05 ENCOUNTER — Encounter: Payer: Self-pay | Admitting: Oncology

## 2011-10-05 DIAGNOSIS — D469 Myelodysplastic syndrome, unspecified: Secondary | ICD-10-CM

## 2011-10-05 DIAGNOSIS — D472 Monoclonal gammopathy: Secondary | ICD-10-CM

## 2011-10-05 DIAGNOSIS — D649 Anemia, unspecified: Secondary | ICD-10-CM

## 2011-10-05 HISTORY — DX: Myelodysplastic syndrome, unspecified: D46.9

## 2011-10-05 LAB — CBC WITH DIFFERENTIAL/PLATELET
BASO%: 1 % (ref 0.0–2.0)
Basophils Absolute: 0.1 10*3/uL (ref 0.0–0.1)
EOS%: 3.7 % (ref 0.0–7.0)
Eosinophils Absolute: 0.2 10*3/uL (ref 0.0–0.5)
HCT: 33.3 % — ABNORMAL LOW (ref 34.8–46.6)
HGB: 11 g/dL — ABNORMAL LOW (ref 11.6–15.9)
LYMPH%: 18 % (ref 14.0–49.7)
MCH: 30.3 pg (ref 25.1–34.0)
MCHC: 33 g/dL (ref 31.5–36.0)
MCV: 91.7 fL (ref 79.5–101.0)
MONO#: 0.5 10*3/uL (ref 0.1–0.9)
MONO%: 8.3 % (ref 0.0–14.0)
NEUT#: 4.3 10*3/uL (ref 1.5–6.5)
NEUT%: 69 % (ref 38.4–76.8)
Platelets: 244 10*3/uL (ref 145–400)
RBC: 3.63 10*6/uL — ABNORMAL LOW (ref 3.70–5.45)
RDW: 17.7 % — ABNORMAL HIGH (ref 11.2–14.5)
WBC: 6.2 10*3/uL (ref 3.9–10.3)
lymph#: 1.1 10*3/uL (ref 0.9–3.3)
nRBC: 0 % (ref 0–0)

## 2011-10-05 MED ORDER — DARBEPOETIN ALFA-POLYSORBATE 500 MCG/ML IJ SOLN: 300.0000 ug | Freq: Once | INTRAMUSCULAR | Status: DC

## 2011-10-09 ENCOUNTER — Telehealth: Payer: Self-pay | Admitting: Oncology

## 2011-10-09 NOTE — Telephone Encounter (Signed)
Called pt , left message, reminded her of appt on 12/7 and to get a new calendar for January.

## 2011-11-02 ENCOUNTER — Other Ambulatory Visit (HOSPITAL_BASED_OUTPATIENT_CLINIC_OR_DEPARTMENT_OTHER): Payer: Medicare Other

## 2011-11-02 ENCOUNTER — Other Ambulatory Visit: Payer: Self-pay | Admitting: Oncology

## 2011-11-02 ENCOUNTER — Ambulatory Visit (HOSPITAL_BASED_OUTPATIENT_CLINIC_OR_DEPARTMENT_OTHER): Payer: Medicare Other

## 2011-11-02 DIAGNOSIS — D649 Anemia, unspecified: Secondary | ICD-10-CM

## 2011-11-02 DIAGNOSIS — D509 Iron deficiency anemia, unspecified: Secondary | ICD-10-CM

## 2011-11-02 DIAGNOSIS — D469 Myelodysplastic syndrome, unspecified: Secondary | ICD-10-CM

## 2011-11-02 LAB — CBC WITH DIFFERENTIAL/PLATELET
BASO%: 1 % (ref 0.0–2.0)
Basophils Absolute: 0.1 10*3/uL (ref 0.0–0.1)
EOS%: 4.5 % (ref 0.0–7.0)
MCH: 31 pg (ref 25.1–34.0)
MCHC: 33.2 g/dL (ref 31.5–36.0)
MCV: 93.2 fL (ref 79.5–101.0)
MONO%: 11 % (ref 0.0–14.0)
RBC: 3.52 10*6/uL — ABNORMAL LOW (ref 3.70–5.45)
RDW: 17.7 % — ABNORMAL HIGH (ref 11.2–14.5)
lymph#: 1.4 10*3/uL (ref 0.9–3.3)

## 2011-11-02 MED ORDER — DARBEPOETIN ALFA-POLYSORBATE 500 MCG/ML IJ SOLN
300.0000 ug | Freq: Once | INTRAMUSCULAR | Status: AC
  Administered 2011-11-02: 300 ug via SUBCUTANEOUS
  Filled 2011-11-02: qty 1

## 2011-11-30 ENCOUNTER — Ambulatory Visit: Payer: Medicare Other

## 2011-11-30 ENCOUNTER — Other Ambulatory Visit: Payer: Medicare Other

## 2011-11-30 ENCOUNTER — Other Ambulatory Visit: Payer: Self-pay

## 2011-11-30 MED ORDER — METOPROLOL TARTRATE 25 MG PO TABS: 25.0000 mg | ORAL_TABLET | Freq: Two times a day (BID) | ORAL | Status: DC

## 2011-11-30 NOTE — Telephone Encounter (Signed)
..   Requested Prescriptions   Signed Prescriptions Disp Refills  . metoprolol tartrate (LOPRESSOR) 25 MG tablet 60 tablet 6    Sig: Take 1 tablet (25 mg total) by mouth 2 (two) times daily.    Authorizing Provider: Hillis Range D    Ordering User: Lacie Scotts

## 2011-12-07 ENCOUNTER — Other Ambulatory Visit: Payer: Self-pay | Admitting: Oncology

## 2011-12-07 ENCOUNTER — Ambulatory Visit (HOSPITAL_BASED_OUTPATIENT_CLINIC_OR_DEPARTMENT_OTHER): Payer: Medicare Other | Admitting: Nurse Practitioner

## 2011-12-07 ENCOUNTER — Other Ambulatory Visit: Payer: Medicare Other | Admitting: Lab

## 2011-12-07 ENCOUNTER — Other Ambulatory Visit (HOSPITAL_BASED_OUTPATIENT_CLINIC_OR_DEPARTMENT_OTHER): Payer: Medicare Other | Admitting: Lab

## 2011-12-07 VITALS — BP 172/67 | HR 66 | Temp 97.0°F | Ht 65.0 in | Wt 150.4 lb

## 2011-12-07 DIAGNOSIS — D472 Monoclonal gammopathy: Secondary | ICD-10-CM | POA: Diagnosis not present

## 2011-12-07 DIAGNOSIS — E039 Hypothyroidism, unspecified: Secondary | ICD-10-CM | POA: Diagnosis not present

## 2011-12-07 DIAGNOSIS — D469 Myelodysplastic syndrome, unspecified: Secondary | ICD-10-CM

## 2011-12-07 DIAGNOSIS — D649 Anemia, unspecified: Secondary | ICD-10-CM

## 2011-12-07 LAB — MORPHOLOGY: PLT EST: ADEQUATE

## 2011-12-07 LAB — COMPREHENSIVE METABOLIC PANEL
ALT: 12 U/L (ref 0–35)
AST: 20 U/L (ref 0–37)
Albumin: 4 g/dL (ref 3.5–5.2)
Calcium: 9.4 mg/dL (ref 8.4–10.5)
Chloride: 104 mEq/L (ref 96–112)
Potassium: 4.4 mEq/L (ref 3.5–5.3)
Sodium: 137 mEq/L (ref 135–145)
Total Protein: 7.4 g/dL (ref 6.0–8.3)

## 2011-12-07 LAB — CBC & DIFF AND RETIC
BASO%: 1.1 % (ref 0.0–2.0)
EOS%: 4.2 % (ref 0.0–7.0)
HCT: 34 % — ABNORMAL LOW (ref 34.8–46.6)
Immature Retic Fract: 8.3 % (ref 1.60–10.00)
LYMPH%: 15.8 % (ref 14.0–49.7)
MCH: 30.9 pg (ref 25.1–34.0)
MCHC: 32.6 g/dL (ref 31.5–36.0)
NEUT%: 70.6 % (ref 38.4–76.8)
lymph#: 0.9 10*3/uL (ref 0.9–3.3)

## 2011-12-07 LAB — LACTATE DEHYDROGENASE: LDH: 209 U/L (ref 94–250)

## 2011-12-07 NOTE — Progress Notes (Signed)
OFFICE PROGRESS NOTE  Interval history:  Gina Wilkinson is a 78 year old woman with MDS. She receives intermittent Aranesp injections. She is seen today for scheduled followup.  Gina Wilkinson reports that she feels well. She has a good energy level. She remains very active. No shortness of breath except with stair climbing. No interim illnesses or infections. No bowel or bladder problems.   Objective: Blood pressure 172/67, pulse 66, temperature 97 F (36.1 C), temperature source Oral, height 5\' 5"  (1.651 m), weight 150 lb 6.4 oz (68.221 kg).  Oropharynx is without thrush or ulceration. Lungs are clear. No wheezes or rales. Slightly irregular cardiac rhythm. Abdomen is soft and nontender. No organomegaly. Extremities are without edema.   Lab Results: Lab Results  Component Value Date   WBC 5.7 12/07/2011   HGB 11.1* 12/07/2011   HCT 34.0* 12/07/2011   MCV 94.7 12/07/2011   PLT 250 12/07/2011    Chemistry:    Chemistry      Component Value Date/Time   NA 136 06/15/2011 1333   NA 136 06/15/2011 1333   K 4.5 06/15/2011 1333   K 4.5 06/15/2011 1333   CL 100 06/15/2011 1333   CL 100 06/15/2011 1333   CO2 25 06/15/2011 1333   CO2 25 06/15/2011 1333   BUN 24* 06/15/2011 1333   BUN 24* 06/15/2011 1333   CREATININE 1.11* 06/15/2011 1333   CREATININE 1.11* 06/15/2011 1333   CREATININE 1.10 03/15/2009 1014      Component Value Date/Time   CALCIUM 9.3 06/15/2011 1333   CALCIUM 9.3 06/15/2011 1333   ALKPHOS 83 06/15/2011 1333   ALKPHOS 83 06/15/2011 1333   AST 21 06/15/2011 1333   AST 21 06/15/2011 1333   ALT 10 06/15/2011 1333   ALT 10 06/15/2011 1333   BILITOT 1.2 06/15/2011 1333   BILITOT 1.2 06/15/2011 1333       Studies/Results: No results found.  Medications: I have reviewed the patient's current medications.  Assessment/Plan:  1. MDS maintained on intermittent Aranesp support.   2. Chronic atrial fibrillation. 3. Hypothyroid on replacement.  Disposition-Gina Wilkinson appears stable. She will  not receive an Aranesp injection today due to hemoglobin greater than 11. We will continue to check labs on a monthly basis with Aranesp injections as indicated. She will return for a followup visit with Dr. Cyndie Chime in 4 months. She will contact the office the interim with any problems.    Lonna Cobb ANP/GNP-BC

## 2012-01-04 ENCOUNTER — Ambulatory Visit (HOSPITAL_BASED_OUTPATIENT_CLINIC_OR_DEPARTMENT_OTHER): Payer: Medicare Other

## 2012-01-04 ENCOUNTER — Other Ambulatory Visit: Payer: Medicare Other | Admitting: Lab

## 2012-01-04 DIAGNOSIS — D469 Myelodysplastic syndrome, unspecified: Secondary | ICD-10-CM

## 2012-01-04 LAB — CBC WITH DIFFERENTIAL/PLATELET
BASO%: 1.2 % (ref 0.0–2.0)
Basophils Absolute: 0.1 10*3/uL (ref 0.0–0.1)
EOS%: 4.8 % (ref 0.0–7.0)
HCT: 33.1 % — ABNORMAL LOW (ref 34.8–46.6)
HGB: 11 g/dL — ABNORMAL LOW (ref 11.6–15.9)
LYMPH%: 17.9 % (ref 14.0–49.7)
MCH: 31.7 pg (ref 25.1–34.0)
MCHC: 33.3 g/dL (ref 31.5–36.0)
MONO#: 0.5 10*3/uL (ref 0.1–0.9)
NEUT%: 68 % (ref 38.4–76.8)
Platelets: 255 10*3/uL (ref 145–400)
lymph#: 1 10*3/uL (ref 0.9–3.3)

## 2012-01-04 NOTE — Progress Notes (Signed)
Pt entered center today for Aranep injection. Labs where noted to be Hemoglobin 11.0  Hematocrit 33.1.  Injection held  Per  Parameters. Pt and family member instructed to  Keep scheduled  Appointments and to call for  Issues. Patient and family member voiced understanding of instructions.

## 2012-02-01 ENCOUNTER — Other Ambulatory Visit (HOSPITAL_BASED_OUTPATIENT_CLINIC_OR_DEPARTMENT_OTHER): Payer: Medicare Other | Admitting: Lab

## 2012-02-01 ENCOUNTER — Ambulatory Visit (HOSPITAL_BASED_OUTPATIENT_CLINIC_OR_DEPARTMENT_OTHER): Payer: Medicare Other

## 2012-02-01 DIAGNOSIS — D649 Anemia, unspecified: Secondary | ICD-10-CM

## 2012-02-01 DIAGNOSIS — D469 Myelodysplastic syndrome, unspecified: Secondary | ICD-10-CM

## 2012-02-01 LAB — CBC WITH DIFFERENTIAL/PLATELET
BASO%: 0.9 % (ref 0.0–2.0)
Basophils Absolute: 0.1 10*3/uL (ref 0.0–0.1)
EOS%: 3.9 % (ref 0.0–7.0)
HCT: 33.4 % — ABNORMAL LOW (ref 34.8–46.6)
HGB: 10.9 g/dL — ABNORMAL LOW (ref 11.6–15.9)
LYMPH%: 15.6 % (ref 14.0–49.7)
MCH: 30.4 pg (ref 25.1–34.0)
MCHC: 32.6 g/dL (ref 31.5–36.0)
MCV: 93 fL (ref 79.5–101.0)
MONO%: 8 % (ref 0.0–14.0)
NEUT%: 71.6 % (ref 38.4–76.8)

## 2012-02-01 MED ORDER — DARBEPOETIN ALFA-POLYSORBATE 300 MCG/0.6ML IJ SOLN
300.0000 ug | Freq: Once | INTRAMUSCULAR | Status: AC
  Administered 2012-02-01: 300 ug via SUBCUTANEOUS
  Filled 2012-02-01: qty 0.6

## 2012-02-08 ENCOUNTER — Telehealth: Payer: Self-pay | Admitting: Oncology

## 2012-02-08 NOTE — Telephone Encounter (Signed)
Called pt and left message, appt was moved from 03/28/12 to 5/10 due to MD's PAL, no availability on 03/31/12

## 2012-02-20 ENCOUNTER — Telehealth: Payer: Self-pay | Admitting: Oncology

## 2012-02-20 NOTE — Telephone Encounter (Signed)
lmonvm of the pt's home regarding her r/s appts from 04/04/2012 to 04/01/2012@3 :30pm. Asked for somebody to call back to confirm the message

## 2012-02-20 NOTE — Telephone Encounter (Signed)
called to confirm appt   aom

## 2012-02-27 ENCOUNTER — Other Ambulatory Visit: Payer: Self-pay | Admitting: *Deleted

## 2012-02-29 ENCOUNTER — Ambulatory Visit: Payer: Medicare Other

## 2012-02-29 ENCOUNTER — Other Ambulatory Visit (HOSPITAL_BASED_OUTPATIENT_CLINIC_OR_DEPARTMENT_OTHER): Payer: Medicare Other

## 2012-02-29 DIAGNOSIS — D469 Myelodysplastic syndrome, unspecified: Secondary | ICD-10-CM | POA: Diagnosis not present

## 2012-02-29 LAB — CBC WITH DIFFERENTIAL/PLATELET
BASO%: 1.3 % (ref 0.0–2.0)
EOS%: 3 % (ref 0.0–7.0)
Eosinophils Absolute: 0.2 10*3/uL (ref 0.0–0.5)
LYMPH%: 16 % (ref 14.0–49.7)
MCH: 30.4 pg (ref 25.1–34.0)
MCHC: 32.8 g/dL (ref 31.5–36.0)
MCV: 92.7 fL (ref 79.5–101.0)
MONO%: 7.9 % (ref 0.0–14.0)
Platelets: 260 10*3/uL (ref 145–400)
RBC: 3.72 10*6/uL (ref 3.70–5.45)
RDW: 16.4 % — ABNORMAL HIGH (ref 11.2–14.5)

## 2012-02-29 NOTE — Progress Notes (Signed)
Aranesp held today d/t hgb 11.3, per protocol.  Pt verbalizes understanding.

## 2012-03-04 ENCOUNTER — Telehealth: Payer: Self-pay | Admitting: *Deleted

## 2012-03-04 NOTE — Telephone Encounter (Signed)
left voice message to informing the patient of the new date and time starting 11:45am

## 2012-03-25 DIAGNOSIS — Q068 Other specified congenital malformations of spinal cord: Secondary | ICD-10-CM | POA: Diagnosis not present

## 2012-03-25 DIAGNOSIS — I4891 Unspecified atrial fibrillation: Secondary | ICD-10-CM | POA: Diagnosis not present

## 2012-03-28 ENCOUNTER — Other Ambulatory Visit: Payer: Medicare Other | Admitting: Lab

## 2012-03-28 ENCOUNTER — Ambulatory Visit: Payer: Medicare Other | Admitting: Oncology

## 2012-04-01 ENCOUNTER — Ambulatory Visit (HOSPITAL_BASED_OUTPATIENT_CLINIC_OR_DEPARTMENT_OTHER): Payer: Medicare Other | Admitting: Nurse Practitioner

## 2012-04-01 ENCOUNTER — Other Ambulatory Visit: Payer: Self-pay | Admitting: Nurse Practitioner

## 2012-04-01 ENCOUNTER — Telehealth: Payer: Self-pay | Admitting: Oncology

## 2012-04-01 ENCOUNTER — Other Ambulatory Visit: Payer: Self-pay | Admitting: *Deleted

## 2012-04-01 ENCOUNTER — Other Ambulatory Visit (HOSPITAL_BASED_OUTPATIENT_CLINIC_OR_DEPARTMENT_OTHER): Payer: Medicare Other | Admitting: Lab

## 2012-04-01 ENCOUNTER — Ambulatory Visit (HOSPITAL_COMMUNITY)
Admission: RE | Admit: 2012-04-01 | Discharge: 2012-04-01 | Disposition: A | Discharge status: Home / Self Care | Payer: Medicare Other | Source: Ambulatory Visit | Attending: Nurse Practitioner | Admitting: Nurse Practitioner

## 2012-04-01 ENCOUNTER — Other Ambulatory Visit: Payer: Self-pay

## 2012-04-01 VITALS — BP 136/84 | HR 86 | Temp 98.9°F | Ht 65.0 in | Wt 147.8 lb

## 2012-04-01 DIAGNOSIS — D469 Myelodysplastic syndrome, unspecified: Secondary | ICD-10-CM

## 2012-04-01 DIAGNOSIS — D599 Acquired hemolytic anemia, unspecified: Secondary | ICD-10-CM

## 2012-04-01 DIAGNOSIS — K449 Diaphragmatic hernia without obstruction or gangrene: Secondary | ICD-10-CM | POA: Insufficient documentation

## 2012-04-01 DIAGNOSIS — D649 Anemia, unspecified: Secondary | ICD-10-CM

## 2012-04-01 DIAGNOSIS — I517 Cardiomegaly: Secondary | ICD-10-CM | POA: Insufficient documentation

## 2012-04-01 DIAGNOSIS — J984 Other disorders of lung: Secondary | ICD-10-CM | POA: Insufficient documentation

## 2012-04-01 DIAGNOSIS — R05 Cough: Secondary | ICD-10-CM

## 2012-04-01 DIAGNOSIS — R509 Fever, unspecified: Secondary | ICD-10-CM

## 2012-04-01 DIAGNOSIS — R059 Cough, unspecified: Secondary | ICD-10-CM | POA: Insufficient documentation

## 2012-04-01 LAB — COMPREHENSIVE METABOLIC PANEL
BUN: 24 mg/dL — ABNORMAL HIGH (ref 6–23)
CO2: 26 mEq/L (ref 19–32)
Glucose, Bld: 95 mg/dL (ref 70–99)
Sodium: 137 mEq/L (ref 135–145)
Total Bilirubin: 1 mg/dL (ref 0.3–1.2)
Total Protein: 7 g/dL (ref 6.0–8.3)

## 2012-04-01 LAB — CBC WITH DIFFERENTIAL/PLATELET
BASO%: 1 % (ref 0.0–2.0)
EOS%: 1.4 % (ref 0.0–7.0)
HCT: 28.8 % — ABNORMAL LOW (ref 34.8–46.6)
LYMPH%: 11.3 % — ABNORMAL LOW (ref 14.0–49.7)
MCH: 30.9 pg (ref 25.1–34.0)
MCHC: 33.5 g/dL (ref 31.5–36.0)
MONO%: 8.8 % (ref 0.0–14.0)
NEUT%: 77.5 % — ABNORMAL HIGH (ref 38.4–76.8)
Platelets: 261 10*3/uL (ref 145–400)
RBC: 3.12 10*6/uL — ABNORMAL LOW (ref 3.70–5.45)
nRBC: 0 % (ref 0–0)

## 2012-04-01 LAB — LACTATE DEHYDROGENASE: LDH: 184 U/L (ref 94–250)

## 2012-04-01 LAB — RETICULOCYTES: RBC: 3.2 10*6/uL — ABNORMAL LOW (ref 3.70–5.45)

## 2012-04-01 MED ORDER — AZITHROMYCIN 500 MG PO TABS: 500.0000 mg | ORAL_TABLET | Freq: Every day | ORAL | Status: AC

## 2012-04-01 MED ORDER — DARBEPOETIN ALFA-POLYSORBATE 300 MCG/0.6ML IJ SOLN
300.0000 ug | Freq: Once | INTRAMUSCULAR | Status: AC
  Administered 2012-04-01: 300 ug via SUBCUTANEOUS
  Filled 2012-04-01: qty 0.6

## 2012-04-01 NOTE — Telephone Encounter (Signed)
gv dtr appt schedule for June thru aug including cxr for 5/7.

## 2012-04-01 NOTE — Progress Notes (Signed)
OFFICE PROGRESS NOTE  Interval history:  [name] is a 78 year old woman with MDS. She is maintained on Aranesp on a 4 week schedule. She is seen today for routine followup.  Ms. Gina Wilkinson reports she feels well. Her daughter reports recent fevers to 102 and a cough. Ms. Gina Wilkinson denies shortness of breath. Overall good energy level. She remains active.   Objective: Blood pressure 136/84, pulse 86, temperature 98.9 F (37.2 C), temperature source Oral, height 5\' 5"  (1.651 m), weight 147 lb 12.8 oz (67.042 kg).  Oropharynx is without thrush or ulceration. No palpable cervical, supraclavicular or axillary lymph nodes. Right mid to lower lung field with expiratory wheezes. Inspiratory rales at the right lateral lung base. No respiratory distress. Slightly irregular cardiac rhythm. Abdomen is soft and nontender. No organomegaly. Extremities without edema.  Lab Results: Lab Results  Component Value Date   WBC 7.6 04/01/2012   HGB 9.6* 04/01/2012   HCT 28.8* 04/01/2012   MCV 92.3 04/01/2012   PLT 261 04/01/2012    Chemistry:    Chemistry      Component Value Date/Time   NA 137 12/07/2011 1419   K 4.4 12/07/2011 1419   CL 104 12/07/2011 1419   CO2 25 12/07/2011 1419   BUN 28* 12/07/2011 1419   CREATININE 1.23* 12/07/2011 1419   CREATININE 1.10 03/15/2009 1014      Component Value Date/Time   CALCIUM 9.4 12/07/2011 1419   ALKPHOS 87 12/07/2011 1419   AST 20 12/07/2011 1419   ALT 12 12/07/2011 1419   BILITOT 0.9 12/07/2011 1419       Studies/Results: No results found.  Medications: I have reviewed the patient's current medications.  Assessment/Plan:  1. MDS maintained on intermittent Aranesp support.  2. Chronic atrial fibrillation. 3. Hypothyroid on replacement. 4. Recent fevers and cough with abnormal lung exam.  Disposition-plan to continue Aranesp on a 4 week schedule. She will receive an injection today. We are referring Ms. Gina Wilkinson for a chest x-ray. She will return for a followup visit  with Dr. Cyndie Wilkinson in 4 months. She will contact the office in the interim with any problems.   Lonna Cobb ANP/GNP-BC

## 2012-04-02 ENCOUNTER — Other Ambulatory Visit: Payer: Self-pay | Admitting: Nurse Practitioner

## 2012-04-02 DIAGNOSIS — R509 Fever, unspecified: Secondary | ICD-10-CM

## 2012-04-02 DIAGNOSIS — R05 Cough: Secondary | ICD-10-CM

## 2012-04-04 ENCOUNTER — Telehealth: Payer: Self-pay | Admitting: Oncology

## 2012-04-04 ENCOUNTER — Ambulatory Visit: Payer: Medicare Other | Admitting: Oncology

## 2012-04-04 ENCOUNTER — Other Ambulatory Visit: Payer: Medicare Other | Admitting: Lab

## 2012-04-04 NOTE — Telephone Encounter (Signed)
Called pt and left message with daughter [name] regarding Chest x-ray

## 2012-04-09 ENCOUNTER — Ambulatory Visit (HOSPITAL_COMMUNITY)
Admission: RE | Admit: 2012-04-09 | Discharge: 2012-04-09 | Disposition: A | Discharge status: Home / Self Care | Payer: Medicare Other | Source: Ambulatory Visit | Attending: Nurse Practitioner | Admitting: Nurse Practitioner

## 2012-04-09 DIAGNOSIS — R509 Fever, unspecified: Secondary | ICD-10-CM

## 2012-04-09 DIAGNOSIS — R059 Cough, unspecified: Secondary | ICD-10-CM | POA: Insufficient documentation

## 2012-04-09 DIAGNOSIS — J984 Other disorders of lung: Secondary | ICD-10-CM | POA: Diagnosis not present

## 2012-04-09 DIAGNOSIS — R0989 Other specified symptoms and signs involving the circulatory and respiratory systems: Secondary | ICD-10-CM | POA: Insufficient documentation

## 2012-04-09 DIAGNOSIS — K449 Diaphragmatic hernia without obstruction or gangrene: Secondary | ICD-10-CM | POA: Insufficient documentation

## 2012-04-09 DIAGNOSIS — R05 Cough: Secondary | ICD-10-CM | POA: Insufficient documentation

## 2012-04-09 DIAGNOSIS — J9819 Other pulmonary collapse: Secondary | ICD-10-CM | POA: Diagnosis not present

## 2012-04-09 DIAGNOSIS — J438 Other emphysema: Secondary | ICD-10-CM | POA: Diagnosis not present

## 2012-04-10 ENCOUNTER — Telehealth: Payer: Self-pay | Admitting: *Deleted

## 2012-04-10 NOTE — Telephone Encounter (Signed)
Received call form pt's daughter, Carneshia Raker stating she is on her way to [facility] & hadn't heard from CXR that pt had yest.& thought she should check before flying out.  Reported that this CXR looked better but will discuss with Dr. Cyndie Chime.  She can be reached at [phone].

## 2012-04-11 ENCOUNTER — Telehealth: Payer: Self-pay

## 2012-04-11 NOTE — Telephone Encounter (Signed)
Message left on Ruth's personalized VM per Dr Cyndie Chime - "repeat CXR much better.  Ok for you to fly out."    Instructed to contact our office for questions. dph

## 2012-04-15 ENCOUNTER — Other Ambulatory Visit (HOSPITAL_COMMUNITY): Payer: Medicare Other

## 2012-04-17 ENCOUNTER — Other Ambulatory Visit: Payer: Self-pay | Admitting: Nurse Practitioner

## 2012-04-17 DIAGNOSIS — D469 Myelodysplastic syndrome, unspecified: Secondary | ICD-10-CM

## 2012-04-18 ENCOUNTER — Telehealth: Payer: Self-pay | Admitting: Oncology

## 2012-04-18 NOTE — Telephone Encounter (Signed)
Per 5/23 pof s/w pt dtr ruth re having pt come in for cxr in about 6wks. Expected date on order is for 6/20 so dtr will have pt do cxr @ [facility] around 6/20. dtr aware this is a walk in appt. No other orders.

## 2012-04-30 ENCOUNTER — Other Ambulatory Visit (HOSPITAL_BASED_OUTPATIENT_CLINIC_OR_DEPARTMENT_OTHER): Payer: Medicare Other | Admitting: Lab

## 2012-04-30 ENCOUNTER — Ambulatory Visit (HOSPITAL_BASED_OUTPATIENT_CLINIC_OR_DEPARTMENT_OTHER): Payer: Medicare Other

## 2012-04-30 VITALS — BP 141/78 | HR 95 | Temp 97.5°F

## 2012-04-30 DIAGNOSIS — D469 Myelodysplastic syndrome, unspecified: Secondary | ICD-10-CM

## 2012-04-30 DIAGNOSIS — D649 Anemia, unspecified: Secondary | ICD-10-CM | POA: Diagnosis not present

## 2012-04-30 LAB — CBC WITH DIFFERENTIAL/PLATELET
Basophils Absolute: 0 10*3/uL (ref 0.0–0.1)
EOS%: 5.5 % (ref 0.0–7.0)
HCT: 31.4 % — ABNORMAL LOW (ref 34.8–46.6)
HGB: 10.2 g/dL — ABNORMAL LOW (ref 11.6–15.9)
LYMPH%: 17.8 % (ref 14.0–49.7)
MCH: 30.1 pg (ref 25.1–34.0)
MCV: 92.6 fL (ref 79.5–101.0)
MONO%: 9.9 % (ref 0.0–14.0)
NEUT%: 66.1 % (ref 38.4–76.8)
Platelets: 194 10*3/uL (ref 145–400)

## 2012-04-30 MED ORDER — DARBEPOETIN ALFA-POLYSORBATE 300 MCG/0.6ML IJ SOLN
300.0000 ug | Freq: Once | INTRAMUSCULAR | Status: AC
  Administered 2012-04-30: 300 ug via SUBCUTANEOUS
  Filled 2012-04-30: qty 0.6

## 2012-05-16 ENCOUNTER — Ambulatory Visit (HOSPITAL_COMMUNITY)
Admission: RE | Admit: 2012-05-16 | Discharge: 2012-05-16 | Disposition: A | Discharge status: Home / Self Care | Payer: Medicare Other | Source: Ambulatory Visit | Attending: Nurse Practitioner | Admitting: Nurse Practitioner

## 2012-05-16 DIAGNOSIS — J8410 Pulmonary fibrosis, unspecified: Secondary | ICD-10-CM | POA: Diagnosis not present

## 2012-05-16 DIAGNOSIS — J42 Unspecified chronic bronchitis: Secondary | ICD-10-CM | POA: Diagnosis not present

## 2012-05-16 DIAGNOSIS — R0989 Other specified symptoms and signs involving the circulatory and respiratory systems: Secondary | ICD-10-CM | POA: Insufficient documentation

## 2012-05-16 DIAGNOSIS — J4489 Other specified chronic obstructive pulmonary disease: Secondary | ICD-10-CM | POA: Insufficient documentation

## 2012-05-16 DIAGNOSIS — J438 Other emphysema: Secondary | ICD-10-CM | POA: Diagnosis not present

## 2012-05-16 DIAGNOSIS — J449 Chronic obstructive pulmonary disease, unspecified: Secondary | ICD-10-CM | POA: Diagnosis not present

## 2012-05-16 DIAGNOSIS — K449 Diaphragmatic hernia without obstruction or gangrene: Secondary | ICD-10-CM | POA: Diagnosis not present

## 2012-05-16 DIAGNOSIS — D469 Myelodysplastic syndrome, unspecified: Secondary | ICD-10-CM | POA: Diagnosis not present

## 2012-05-16 DIAGNOSIS — I517 Cardiomegaly: Secondary | ICD-10-CM | POA: Diagnosis not present

## 2012-05-19 ENCOUNTER — Telehealth: Payer: Self-pay

## 2012-05-19 NOTE — Telephone Encounter (Signed)
Received call from pt's daughter stating that she is leaving to go out of town in the morning, and would like to know of pt's chest xray results done last Friday.  Informed her will speak with providers and office will call her back, as Dr. Cyndie Chime is out of the office.  She verbalizes understanding.

## 2012-05-19 NOTE — Telephone Encounter (Signed)
Spoke with dtr. [name] and told her that there were no abnormal areas from what was seen on previous x-ray.

## 2012-05-23 ENCOUNTER — Ambulatory Visit (HOSPITAL_COMMUNITY): Admission: RE | Admit: 2012-05-23 | Payer: Medicare Other | Source: Ambulatory Visit

## 2012-05-23 ENCOUNTER — Ambulatory Visit (HOSPITAL_COMMUNITY)
Admission: RE | Admit: 2012-05-23 | Discharge: 2012-05-23 | Disposition: A | Discharge status: Home / Self Care | Payer: Medicare Other | Source: Ambulatory Visit | Attending: Internal Medicine | Admitting: Internal Medicine

## 2012-05-23 DIAGNOSIS — M79606 Pain in leg, unspecified: Secondary | ICD-10-CM

## 2012-05-23 DIAGNOSIS — M7989 Other specified soft tissue disorders: Secondary | ICD-10-CM | POA: Insufficient documentation

## 2012-05-23 DIAGNOSIS — M79609 Pain in unspecified limb: Secondary | ICD-10-CM | POA: Insufficient documentation

## 2012-05-23 NOTE — Progress Notes (Signed)
VASCULAR LAB PRELIMINARY  PRELIMINARY  PRELIMINARY  PRELIMINARY  Bilateral lower extremity venous duplex completed.    Preliminary report:  Bilateral:  No evidence of DVT, superficial thrombosis, or Baker's Cyst. Moderate interstitial fluid noted in the right popliteal foss and calf. Mild interstitial fluid noted in the left calf   Yaphet Smethurst, Jalaiyah, RVS 05/23/2012, 2:48 PM

## 2012-05-30 ENCOUNTER — Ambulatory Visit (HOSPITAL_BASED_OUTPATIENT_CLINIC_OR_DEPARTMENT_OTHER): Payer: Medicare Other

## 2012-05-30 ENCOUNTER — Other Ambulatory Visit (HOSPITAL_BASED_OUTPATIENT_CLINIC_OR_DEPARTMENT_OTHER): Payer: Medicare Other | Admitting: Lab

## 2012-05-30 DIAGNOSIS — D469 Myelodysplastic syndrome, unspecified: Secondary | ICD-10-CM | POA: Diagnosis not present

## 2012-05-30 DIAGNOSIS — D649 Anemia, unspecified: Secondary | ICD-10-CM

## 2012-05-30 LAB — CBC WITH DIFFERENTIAL/PLATELET
Basophils Absolute: 0.2 10*3/uL — ABNORMAL HIGH (ref 0.0–0.1)
Eosinophils Absolute: 0.5 10*3/uL (ref 0.0–0.5)
HGB: 11.5 g/dL — ABNORMAL LOW (ref 11.6–15.9)
LYMPH%: 18.7 % (ref 14.0–49.7)
MCV: 93 fL (ref 79.5–101.0)
MONO%: 7.9 % (ref 0.0–14.0)
NEUT#: 4.1 10*3/uL (ref 1.5–6.5)
Platelets: 291 10*3/uL (ref 145–400)
RDW: 18.1 % — ABNORMAL HIGH (ref 11.2–14.5)

## 2012-05-30 MED ORDER — DARBEPOETIN ALFA-POLYSORBATE 500 MCG/ML IJ SOLN: 300.0000 ug | Freq: Once | INTRAMUSCULAR | Status: DC

## 2012-06-26 ENCOUNTER — Other Ambulatory Visit: Payer: Self-pay | Admitting: Oncology

## 2012-06-27 ENCOUNTER — Ambulatory Visit (HOSPITAL_BASED_OUTPATIENT_CLINIC_OR_DEPARTMENT_OTHER): Payer: Medicare Other

## 2012-06-27 ENCOUNTER — Other Ambulatory Visit: Payer: Medicare Other | Admitting: Lab

## 2012-06-27 VITALS — BP 149/73 | HR 68 | Temp 97.2°F

## 2012-06-27 DIAGNOSIS — D469 Myelodysplastic syndrome, unspecified: Secondary | ICD-10-CM

## 2012-06-27 DIAGNOSIS — E039 Hypothyroidism, unspecified: Secondary | ICD-10-CM

## 2012-06-27 LAB — CBC WITH DIFFERENTIAL/PLATELET
BASO%: 1.2 % (ref 0.0–2.0)
Basophils Absolute: 0.1 10*3/uL (ref 0.0–0.1)
HCT: 32.4 % — ABNORMAL LOW (ref 34.8–46.6)
LYMPH%: 15.9 % (ref 14.0–49.7)
MCHC: 33.5 g/dL (ref 31.5–36.0)
MONO#: 0.4 10*3/uL (ref 0.1–0.9)
NEUT%: 69.6 % (ref 38.4–76.8)
Platelets: 219 10*3/uL (ref 145–400)
WBC: 4.7 10*3/uL (ref 3.9–10.3)

## 2012-06-27 MED ORDER — DARBEPOETIN ALFA-POLYSORBATE 300 MCG/0.6ML IJ SOLN
300.0000 ug | Freq: Once | INTRAMUSCULAR | Status: AC
  Administered 2012-06-27: 300 ug via SUBCUTANEOUS
  Filled 2012-06-27: qty 0.6

## 2012-06-27 NOTE — Patient Instructions (Addendum)
See MD for problems 

## 2012-07-04 ENCOUNTER — Other Ambulatory Visit: Payer: Self-pay | Admitting: Oncology

## 2012-07-04 DIAGNOSIS — D469 Myelodysplastic syndrome, unspecified: Secondary | ICD-10-CM

## 2012-07-07 ENCOUNTER — Telehealth: Payer: Self-pay | Admitting: Oncology

## 2012-07-07 NOTE — Telephone Encounter (Signed)
Called pt, left message regarding appt for August , lab and MD, reminded pt to get an appt calendar when she gets here

## 2012-07-25 ENCOUNTER — Telehealth: Payer: Self-pay | Admitting: Oncology

## 2012-07-25 ENCOUNTER — Other Ambulatory Visit (HOSPITAL_BASED_OUTPATIENT_CLINIC_OR_DEPARTMENT_OTHER): Payer: Medicare Other | Admitting: Lab

## 2012-07-25 ENCOUNTER — Ambulatory Visit (HOSPITAL_BASED_OUTPATIENT_CLINIC_OR_DEPARTMENT_OTHER): Payer: Medicare Other | Admitting: Oncology

## 2012-07-25 VITALS — BP 175/88 | HR 72 | Temp 97.4°F | Resp 18 | Ht 65.0 in | Wt 157.5 lb

## 2012-07-25 DIAGNOSIS — D469 Myelodysplastic syndrome, unspecified: Secondary | ICD-10-CM

## 2012-07-25 DIAGNOSIS — D4620 Refractory anemia with excess of blasts, unspecified: Secondary | ICD-10-CM

## 2012-07-25 DIAGNOSIS — I4891 Unspecified atrial fibrillation: Secondary | ICD-10-CM | POA: Diagnosis not present

## 2012-07-25 LAB — CBC & DIFF AND RETIC
Basophils Absolute: 0.1 10*3/uL (ref 0.0–0.1)
HCT: 33.5 % — ABNORMAL LOW (ref 34.8–46.6)
HGB: 10.9 g/dL — ABNORMAL LOW (ref 11.6–15.9)
Immature Retic Fract: 11.6 % — ABNORMAL HIGH (ref 1.60–10.00)
MONO#: 0.4 10*3/uL (ref 0.1–0.9)
NEUT#: 3.3 10*3/uL (ref 1.5–6.5)
NEUT%: 68.3 % (ref 38.4–76.8)
RDW: 18.2 % — ABNORMAL HIGH (ref 11.2–14.5)
Retic %: 2.13 % — ABNORMAL HIGH (ref 0.70–2.10)
Retic Ct Abs: 76.25 10*3/uL (ref 33.70–90.70)
WBC: 4.8 10*3/uL (ref 3.9–10.3)
lymph#: 0.8 10*3/uL — ABNORMAL LOW (ref 0.9–3.3)

## 2012-07-25 LAB — COMPREHENSIVE METABOLIC PANEL (CC13)
ALT: 16 U/L (ref 0–55)
BUN: 25 mg/dL (ref 7.0–26.0)
CO2: 29 mEq/L (ref 22–29)
Creatinine: 1.2 mg/dL — ABNORMAL HIGH (ref 0.6–1.1)
Glucose: 142 mg/dl — ABNORMAL HIGH (ref 70–99)
Total Bilirubin: 1.4 mg/dL — ABNORMAL HIGH (ref 0.20–1.20)

## 2012-07-25 LAB — LACTATE DEHYDROGENASE (CC13): LDH: 251 U/L — ABNORMAL HIGH (ref 125–220)

## 2012-07-25 NOTE — Telephone Encounter (Signed)
gve the pt's sister the sept-march 2014 appt calendar

## 2012-07-26 NOTE — Progress Notes (Signed)
Hematology and Oncology Follow Up Visit  Gina Wilkinson [id number] 16-Nov-1920 78 y.o. 07/26/2012 7:57 PM   Principle Diagnosis: Encounter Diagnosis  Name Primary?  . MDS (myelodysplastic syndrome) Yes     Interim History:  Followup visit for this delightful 78 year old woman with a low-grade myelodysplastic syndrome characterized by anemia alone. She has had a nice and durable response to intermittent Aranesp injections now given on an every four-week basis. She has had no interim medical problems. She remains very active. The only thing that slows her down is her arthritis. She denied any complaints on a extensive review of systems.  Medications: reviewed  Allergies:  Allergies  Allergen Reactions  . Levaquin (Levofloxacin)   . Penicillins   . Sulfonamide Derivatives     Review of Systems: Constitutional:   No constitutional symptoms Respiratory: No cough or dyspnea Cardiovascular:  No chest pain or palpitations Gastrointestinal: No abdominal pain no change in bowel habit Genito-Urinary: No urinary tract symptoms Musculoskeletal: Bone pain related to arthritis Neurologic: No headache or change in vision Skin: No rash, she has noted ecchymoses on her hands Remaining ROS negative.  Physical Exam: Blood pressure 175/88, pulse 72, temperature 97.4 F (36.3 C), temperature source Oral, resp. rate 18, height 5\' 5"  (1.651 m), weight 157 lb 8 oz (71.442 kg). Wt Readings from Last 3 Encounters:  07/25/12 157 lb 8 oz (71.442 kg)  04/01/12 147 lb 12.8 oz (67.042 kg)  12/07/11 150 lb 6.4 oz (68.221 kg)     General appearance: Well-nourished Caucasian woman looking younger than her stated age HENNT: Pharynx no erythema or exudate Lymph nodes: No lymphadenopathy Breasts: Not examined Lungs: Clear to auscultation resonant to percussion Heart: Regular rhythm no murmur Abdomen: Soft nontender no mass no organomegaly Extremities: No edema no calf tenderness. Vascular: No  cyanosis Neurologic: She is alert and oriented, motor strength is 5 over 5, she bumped her arm on the lab table when she was getting her lab work done today no obvious trauma. No laceration. Skin: Very thin skin. Ecchymosis subcutaneous tissues dorsum left hand where she recently bumped herself  Lab Results: Lab Results  Component Value Date   WBC 4.8 07/25/2012   HGB 10.9* 07/25/2012   HCT 33.5* 07/25/2012   MCV 93.6 07/25/2012   PLT 215 07/25/2012     Chemistry      Component Value Date/Time   NA 140 07/25/2012 1005   NA 137 04/01/2012 1146   K 4.6 07/25/2012 1005   K 4.3 04/01/2012 1146   CL 105 07/25/2012 1005   CL 101 04/01/2012 1146   CO2 29 07/25/2012 1005   CO2 26 04/01/2012 1146   BUN 25.0 07/25/2012 1005   BUN 24* 04/01/2012 1146   CREATININE 1.2* 07/25/2012 1005   CREATININE 1.11* 04/01/2012 1146   CREATININE 1.10 03/15/2009 1014      Component Value Date/Time   CALCIUM 9.6 07/25/2012 1005   CALCIUM 9.2 04/01/2012 1146   ALKPHOS 78 07/25/2012 1005   ALKPHOS 59 04/01/2012 1146   AST 21 07/25/2012 1005   AST 16 04/01/2012 1146   ALT 16 07/25/2012 1005   ALT 9 04/01/2012 1146   BILITOT 1.40* 07/25/2012 1005   BILITOT 1.0 04/01/2012 1146       Impression and Plan: #1. Low-grade myelodysplastic syndrome stable on intermittent Aranesp support. Plan continue the same.  #2. Chronic atrial fibrillation  #3. Hypothyroid on replacement.  CC:. Dr. Drucie Opitz in [facility]; Dr. Janace Hoard; Dr. Sharrell Ku  Levert Feinstein, MD 8/31/20137:57 PM

## 2012-08-07 ENCOUNTER — Encounter: Payer: Self-pay | Admitting: *Deleted

## 2012-08-21 ENCOUNTER — Telehealth: Payer: Self-pay | Admitting: *Deleted

## 2012-08-21 NOTE — Telephone Encounter (Signed)
Received call from pt daughter Vonice Cromley requesting Dr. Cyndie Chime to check pt thyroid level with lab appt on 08/22/12 at 3:30.  Note to Dr. Cyndie Chime.

## 2012-08-22 ENCOUNTER — Other Ambulatory Visit (HOSPITAL_BASED_OUTPATIENT_CLINIC_OR_DEPARTMENT_OTHER): Payer: Medicare Other | Admitting: Lab

## 2012-08-22 ENCOUNTER — Ambulatory Visit: Payer: Medicare Other

## 2012-08-22 ENCOUNTER — Other Ambulatory Visit: Payer: Self-pay | Admitting: *Deleted

## 2012-08-22 DIAGNOSIS — D649 Anemia, unspecified: Secondary | ICD-10-CM | POA: Diagnosis not present

## 2012-08-22 DIAGNOSIS — E039 Hypothyroidism, unspecified: Secondary | ICD-10-CM

## 2012-08-22 DIAGNOSIS — D469 Myelodysplastic syndrome, unspecified: Secondary | ICD-10-CM

## 2012-08-22 LAB — CBC WITH DIFFERENTIAL/PLATELET
Basophils Absolute: 0.1 10*3/uL (ref 0.0–0.1)
EOS%: 4.3 % (ref 0.0–7.0)
Eosinophils Absolute: 0.2 10*3/uL (ref 0.0–0.5)
HGB: 11.2 g/dL — ABNORMAL LOW (ref 11.6–15.9)
MCH: 31.7 pg (ref 25.1–34.0)
NEUT#: 3.8 10*3/uL (ref 1.5–6.5)
RBC: 3.53 10*6/uL — ABNORMAL LOW (ref 3.70–5.45)
RDW: 18.8 % — ABNORMAL HIGH (ref 11.2–14.5)
lymph#: 0.9 10*3/uL (ref 0.9–3.3)

## 2012-08-22 LAB — TSH: TSH: 0.995 u[IU]/mL (ref 0.350–4.500)

## 2012-08-22 NOTE — Progress Notes (Signed)
Patient  Here for lab and possible injection.   HGB 11.2  Above the parameters for injection.  Injection not given.

## 2012-08-25 ENCOUNTER — Telehealth: Payer: Self-pay | Admitting: *Deleted

## 2012-08-25 NOTE — Telephone Encounter (Signed)
[name] (dtr) called and requested that thyroid test results be sent to Dr. Drucie Opitz in [facility] and Dr. Venetia Night in [place].   He is with [facility]  Fax is [phone]

## 2012-08-28 ENCOUNTER — Telehealth: Payer: Self-pay | Admitting: *Deleted

## 2012-08-28 DIAGNOSIS — L02619 Cutaneous abscess of unspecified foot: Secondary | ICD-10-CM | POA: Diagnosis not present

## 2012-08-28 DIAGNOSIS — B351 Tinea unguium: Secondary | ICD-10-CM | POA: Diagnosis not present

## 2012-08-28 DIAGNOSIS — L03039 Cellulitis of unspecified toe: Secondary | ICD-10-CM | POA: Diagnosis not present

## 2012-08-28 NOTE — Telephone Encounter (Signed)
Received call from daughter Niana Eke requesting TSH results; reported that Dr. Drucie Opitz in [facility] and Dr. Venetia Night in [place] have not received fax and "mother's medicine will run out today"  Per Dr. Cyndie Chime, called and spoke with [name] and gave TSH results and explained that results were faxed 08/25/12.  Pt daughter verbalized understanding and expressed appreciation for call.

## 2012-09-06 ENCOUNTER — Emergency Department (HOSPITAL_COMMUNITY)
Admission: EM | Admit: 2012-09-06 | Discharge: 2012-09-06 | Disposition: A | Discharge status: Home / Self Care | Payer: Medicare Other | Attending: Emergency Medicine | Admitting: Emergency Medicine

## 2012-09-06 ENCOUNTER — Encounter (HOSPITAL_COMMUNITY): Payer: Self-pay | Admitting: Emergency Medicine

## 2012-09-06 ENCOUNTER — Emergency Department (HOSPITAL_COMMUNITY): Payer: Medicare Other

## 2012-09-06 DIAGNOSIS — Z79899 Other long term (current) drug therapy: Secondary | ICD-10-CM | POA: Insufficient documentation

## 2012-09-06 DIAGNOSIS — H6120 Impacted cerumen, unspecified ear: Secondary | ICD-10-CM | POA: Diagnosis not present

## 2012-09-06 DIAGNOSIS — R4182 Altered mental status, unspecified: Secondary | ICD-10-CM | POA: Diagnosis not present

## 2012-09-06 DIAGNOSIS — I10 Essential (primary) hypertension: Secondary | ICD-10-CM | POA: Diagnosis not present

## 2012-09-06 DIAGNOSIS — Z7982 Long term (current) use of aspirin: Secondary | ICD-10-CM | POA: Diagnosis not present

## 2012-09-06 DIAGNOSIS — I4891 Unspecified atrial fibrillation: Secondary | ICD-10-CM | POA: Diagnosis not present

## 2012-09-06 DIAGNOSIS — J90 Pleural effusion, not elsewhere classified: Secondary | ICD-10-CM | POA: Diagnosis not present

## 2012-09-06 DIAGNOSIS — R42 Dizziness and giddiness: Secondary | ICD-10-CM | POA: Diagnosis not present

## 2012-09-06 HISTORY — DX: Essential (primary) hypertension: I10

## 2012-09-06 LAB — COMPREHENSIVE METABOLIC PANEL
ALT: 14 U/L (ref 0–35)
AST: 25 U/L (ref 0–37)
Alkaline Phosphatase: 64 U/L (ref 39–117)
CO2: 29 mEq/L (ref 19–32)
Calcium: 9.2 mg/dL (ref 8.4–10.5)
Chloride: 97 mEq/L (ref 96–112)
GFR calc Af Amer: 60 mL/min — ABNORMAL LOW (ref >90)
GFR calc non Af Amer: 52 mL/min — ABNORMAL LOW (ref >90)
Glucose, Bld: 126 mg/dL — ABNORMAL HIGH (ref 70–99)
Potassium: 3.9 mEq/L (ref 3.5–5.1)
Sodium: 133 mEq/L — ABNORMAL LOW (ref 135–145)
Total Bilirubin: 1.2 mg/dL (ref 0.3–1.2)

## 2012-09-06 LAB — CBC WITH DIFFERENTIAL/PLATELET
Basophils Absolute: 0 10*3/uL (ref 0.0–0.1)
Eosinophils Relative: 1 % (ref 0–5)
Lymphocytes Relative: 10 % — ABNORMAL LOW (ref 12–46)
Lymphs Abs: 0.7 10*3/uL (ref 0.7–4.0)
MCV: 93.1 fL (ref 78.0–100.0)
Neutro Abs: 5.9 10*3/uL (ref 1.7–7.7)
Platelets: 270 10*3/uL (ref 150–400)
RBC: 3.63 MIL/uL — ABNORMAL LOW (ref 3.87–5.11)
RDW: 17 % — ABNORMAL HIGH (ref 11.5–15.5)
WBC: 7.1 10*3/uL (ref 4.0–10.5)

## 2012-09-06 LAB — URINALYSIS, ROUTINE W REFLEX MICROSCOPIC
Bilirubin Urine: NEGATIVE
Hgb urine dipstick: NEGATIVE
Ketones, ur: NEGATIVE mg/dL
Nitrite: NEGATIVE
Specific Gravity, Urine: 1.016 (ref 1.005–1.030)
Urobilinogen, UA: 1 mg/dL (ref 0.0–1.0)

## 2012-09-06 LAB — GLUCOSE, CAPILLARY

## 2012-09-06 MED ORDER — ONDANSETRON HCL 4 MG/2ML IJ SOLN
4.0000 mg | INTRAMUSCULAR | Status: DC | PRN
  Administered 2012-09-06: 4 mg via INTRAVENOUS
  Filled 2012-09-06: qty 2

## 2012-09-06 MED ORDER — SODIUM CHLORIDE 0.9 % IV BOLUS (SEPSIS)
1000.0000 mL | Freq: Once | INTRAVENOUS | Status: AC
  Administered 2012-09-06: 1000 mL via INTRAVENOUS

## 2012-09-06 MED ORDER — LIDOCAINE HCL 2 % EX GEL
Freq: Once | CUTANEOUS | Status: AC
  Administered 2012-09-06: 17:00:00 via TOPICAL
  Filled 2012-09-06: qty 10

## 2012-09-06 NOTE — ED Provider Notes (Signed)
Rate: 63  Rhythm: a. fib.  QRS Axis: normal  Intervals: normal  ST/T Wave abnormalities: normal  Conduction Disutrbances: right bundle branch block  Old EKG Reviewed: no sig change  Medical screening examination/treatment/procedure(s) were conducted as a shared visit with non-physician practitioner(s) and myself.  I personally evaluated the patient during the encounter  Patient lives alone but is checked on closely by her family members.  She presented this morning after having an episode of not being as sharp as usual per her family. There were no focal neurologic deficits noted. She has had recent toe infection has been on antibiotics for that. As noticed a couple of loose stools recently but otherwise has been acting fine. At this time patient denies any complaints. We'll plan with labs and CT scan of the head and continue to observe  Celene Kras, MD 09/06/12 1417

## 2012-09-06 NOTE — ED Provider Notes (Signed)
History     CSN: [id number]  Arrival date & time 09/06/12  1200   First MD Initiated Contact with Patient 09/06/12 1230      Chief Complaint  Patient presents with  . Altered Mental Status    (Consider location/radiation/quality/duration/timing/severity/associated sxs/prior treatment) HPI Comments: Patient is a 78 year old female with a history of atrial fibrillation and hypertension that presents emergency department with her daughters complaining of altered mental status.  History is obtained from family members who state that beginning approximately 830 this morning the patient did not seemed "foggy" on the telephone.  They deny any slurred speech or aphasia & report that their mother and"just didn't sound right." The pt denies any falls or head trauma, F/NS/chills, weakness or numbness of extremities, dysequilibrium, change in vision, abdominal pain, dysuria, cough, SOB, CP, or urinary frequency. The patient has been taking Keflex 500 BID for 8 days for a toe infection and reports 1 episode of loose stools that occurred this morning. Pt denies taking blood thinners and has not received flu prophylaxis.   Patient is a 77 y.o. female presenting with altered mental status. The history is provided by the patient.  Altered Mental Status Pertinent negatives include no congestion, coughing, diaphoresis, fever, headaches, myalgias, neck pain, numbness or weakness.    Past Medical History  Diagnosis Date  . MDS (myelodysplastic syndrome) 10/05/2011  . Pneumonia   . Thyroid disease   . Hypertension   . Atrial fibrillation     Past Surgical History  Procedure Date  . Orif femur fracture 2001    left    No family history on file.  History  Substance Use Topics  . Smoking status: Never Smoker   . Smokeless tobacco: Never Used  . Alcohol Use: No    OB History    Grav Para Term Preterm Abortions TAB SAB Ect Mult Living                  Review of Systems  Constitutional:  Negative for fever, diaphoresis and activity change.  HENT: Negative for congestion and neck pain.   Respiratory: Negative for cough.   Genitourinary: Negative for dysuria.  Musculoskeletal: Negative for myalgias.  Skin: Negative for color change and wound.  Neurological: Positive for light-headedness. Negative for dizziness, seizures, syncope, facial asymmetry, speech difficulty, weakness, numbness and headaches.  Psychiatric/Behavioral: Positive for altered mental status. Negative for confusion and agitation.  All other systems reviewed and are negative.    Allergies  Levaquin; Penicillins; and Sulfonamide derivatives  Home Medications   Current Outpatient Rx  Name Route Sig Dispense Refill  . ASPIRIN 81 MG PO TABS Oral Take 81 mg by mouth daily.    . CEPHALEXIN 500 MG PO CAPS Oral Take 500 mg by mouth 2 (two) times daily.    Marland Kitchen VITAMIN D-3 5000 UNITS PO TABS Oral Take 1 tablet by mouth daily.    Marland Kitchen VITAMIN B 12 PO Oral Take 3,000 mg by mouth daily.    Marland Kitchen GINKGO BILOBA 120 MG PO CAPS Oral Take 120 mg by mouth daily.    Marland Kitchen LEVOTHYROXINE SODIUM 125 MCG PO TABS Oral Take 125 mcg by mouth daily.    Marland Kitchen LISINOPRIL 10 MG PO TABS Oral Take 5 mg by mouth 2 (two) times daily.     Marland Kitchen METOPROLOL SUCCINATE ER 100 MG PO TB24 Oral Take 100 mg by mouth daily.       BP 165/93  Pulse 76  Temp 99.1 F (37.3 C) (  Oral)  SpO2 96%  Physical Exam  Nursing note and vitals reviewed. Constitutional: She is oriented to person, place, and time. She appears well-developed and well-nourished. No distress.  HENT:  Head: Normocephalic and atraumatic.  Eyes: Conjunctivae normal and EOM are normal. Pupils are equal, round, and reactive to light. No scleral icterus.  Neck: Normal range of motion and full passive range of motion without pain. Neck supple. No JVD present. Carotid bruit is not present. No rigidity. No Brudzinski's sign noted.  Cardiovascular:       Heart rate normal, rhythm irregularly irregular.   Intact distal pulses.  1+ pedal pitting edema bilaterally  Pulmonary/Chest: Effort normal and breath sounds normal. No respiratory distress. She has no wheezes. She has no rales.       Lungs clear to auscultation bilaterally, no acute respiratory distress.  Musculoskeletal: Normal range of motion.  Lymphadenopathy:    She has no cervical adenopathy.  Neurological: She is alert and oriented to person, place, and time. She has normal strength. No cranial nerve deficit or sensory deficit. She displays a negative Romberg sign. Coordination and gait normal. GCS eye subscore is 4. GCS verbal subscore is 5. GCS motor subscore is 6.       A&O x3.  Able to follow commands. PERRL, EOMs, no nystagmus. Shoulder shrug, facial muscles, tongue protrusion and swallow intact.  Motor strength 5/5 bilaterally. Light touch intact in all 4 distal limbs.  Intact finger to nose, shin to heel and rapid alternating movements. No ataxia or dysequilibrium.   Skin: Skin is warm and dry. No rash noted. She is not diaphoretic.  Psychiatric: She has a normal mood and affect. Her behavior is normal.    ED Course  EAR CERUMEN REMOVAL Date/Time: 09/06/2012 5:16 PM Performed by: Jaci Carrel Authorized by: Jaci Carrel Risks and benefits: risks, benefits and alternatives were discussed Consent given by: patient Patient understanding: patient states understanding of the procedure being performed Patient consent: the patient's understanding of the procedure matches consent given Patient identity confirmed: verbally with patient Local anesthetic: proparacaine drops Anesthetic total: 3 drops Location details: left ear Procedure type: curette and irrigation Patient sedated: no Patient tolerance: Patient tolerated the procedure well with no immediate complications.   (including critical care time)  Labs Reviewed  CBC WITH DIFFERENTIAL - Abnormal; Notable for the following:    RBC 3.63 (*)     Hemoglobin 11.0 (*)     HCT  33.8 (*)     RDW 17.0 (*)     Neutrophils Relative 83 (*)     Lymphocytes Relative 10 (*)     All other components within normal limits  COMPREHENSIVE METABOLIC PANEL - Abnormal; Notable for the following:    Sodium 133 (*)     Glucose, Bld 126 (*)     GFR calc non Af Amer 52 (*)     GFR calc Af Amer 60 (*)     All other components within normal limits  GLUCOSE, CAPILLARY - Abnormal; Notable for the following:    Glucose-Capillary 118 (*)     All other components within normal limits  URINALYSIS, ROUTINE W REFLEX MICROSCOPIC   Dg Chest 2 View  09/06/2012  *RADIOLOGY REPORT*  Clinical Data: Altered mental status.  CHEST - 2 VIEW  Comparison: Chest x-ray 05/16/2012.  Findings: Mild cardiomegaly is unchanged.  There is cephalization of the pulmonary vasculature and slight indistinctness of the interstitial markings suggestive of mild pulmonary edema.  Trace bilateral pleural effusions.  Bibasilar opacities favored to represent subsegmental atelectasis.  This may in part relate to large hiatal hernia (unchanged).  Atherosclerosis in the thoracic aorta.  IMPRESSION: 1.  Findings, as above, suggestive of mild congestive heart failure. 2.  Large hiatal hernia again noted. 3.  Atherosclerosis.   Original Report Authenticated By: Florencia Reasons, M.D.    Ct Head Wo Contrast (only If Suspected Head Trauma And/or Pt Is On Anticoagulant)  09/06/2012  *RADIOLOGY REPORT*  Clinical Data: Altered mental status.  Possible head trauma.  CT HEAD WITHOUT CONTRAST  Technique:  Contiguous axial images were obtained from the base of the skull through the vertex without contrast.  Comparison: No priors.  Findings: Mild cerebral and cerebellar atrophy is age appropriate. There are extensive patchy and confluent areas of decreased attenuation throughout the deep and periventricular white matter of the cerebral hemispheres bilaterally, compatible with chronic microvascular ischemic changes. No acute displaced skull  fractures are identified.  No acute intracranial abnormality.  Specifically, no evidence of acute post-traumatic intracranial hemorrhage, no definite regions of acute/subacute cerebral ischemia, no focal mass, mass effect, hydrocephalus or abnormal intra or extra-axial fluid collections.  The visualized paranasal sinuses and mastoids are well pneumatized.  IMPRESSION: 1.  No acute displaced skull fractures or acute intracranial abnormalities. 2.  Cerebral and cerebellar atrophy with extensive chronic microvascular ischemic changes in the cerebral white matter, as above.   Original Report Authenticated By: Florencia Reasons, M.D.      No diagnosis found.    MDM  Alt mental status & cerumen removal  Patient 78 year old female who was brought into the emergency department by her daughters for altered mental status.  Symptoms began this morning and were described as the mother being "off".  Labs and imaging reviewed without any acute abnormalities.  Patient has no focal neuro deficits on physical exam is able to ambulate in the emergency department at her baseline.  Patient seen and evaluated with Dr. Lynelle Doctor who agrees with my plan to discharge patient with instructions to followup with primary care provider.  Patient is in no acute distress prior to discharge.  Strict return precautions discussed including symptoms of TIA and stroke as the patient is at increased risk having atrial fibrillation.  While in the emergency department patient was in A. fib, but she is asymptomatic from this and is rate controlled with metoprolol.  At this time there does not appear to be any evidence of an acute emergency medical condition and the patient appears stable for discharge with appropriate outpatient follow up.Diagnosis was discussed with patient who verbalizes understanding and is agreeable to discharge.         Jaci Carrel, [place] 09/06/12 1719

## 2012-09-06 NOTE — ED Notes (Signed)
Irrigated ear with war water and hydrogen peroxide via an 18 gauge angiocath.  Pt did not tolerate procedure well and began to scream, stating, "it hurts, please stop."  No ear wax noted in irrigation.  Pt's ear looks clean after irrigation.

## 2012-09-06 NOTE — ED Notes (Addendum)
Pt comes from home where she lives alone.  Pt's daughters state pt was "not herself this a.m." and was not able to get herself dressed.  Daughters state this is unusual.  Pt also seemed to be lethargic and unable to complete thoughts and sentences.  Pt also declined to eat or drink.

## 2012-09-06 NOTE — ED Notes (Signed)
Pt here w/ daughters who are concerned regarding pt's mental status this morning. No appetite this morning, did not get self completely dressed this morning. Pt lives alone and is very independent. Went out to eat w/ a friend last p.m. And did not recall this either. Diarrhea x 1 this morning.

## 2012-09-09 ENCOUNTER — Other Ambulatory Visit (HOSPITAL_COMMUNITY): Payer: Self-pay | Admitting: Internal Medicine

## 2012-09-09 DIAGNOSIS — I639 Cerebral infarction, unspecified: Secondary | ICD-10-CM

## 2012-09-14 ENCOUNTER — Other Ambulatory Visit: Payer: Self-pay | Admitting: Oncology

## 2012-09-15 ENCOUNTER — Telehealth: Payer: Self-pay | Admitting: Oncology

## 2012-09-15 NOTE — Telephone Encounter (Signed)
Called pt and left message regarding lab and injection for December 2013 , advised patient to get calendar for 2014

## 2012-09-18 ENCOUNTER — Ambulatory Visit (HOSPITAL_COMMUNITY)
Admission: RE | Admit: 2012-09-18 | Discharge: 2012-09-18 | Payer: Medicare Other | Source: Ambulatory Visit | Attending: Internal Medicine | Admitting: Internal Medicine

## 2012-09-19 ENCOUNTER — Ambulatory Visit: Payer: Medicare Other

## 2012-09-19 ENCOUNTER — Other Ambulatory Visit (HOSPITAL_BASED_OUTPATIENT_CLINIC_OR_DEPARTMENT_OTHER): Payer: Medicare Other | Admitting: Lab

## 2012-09-19 DIAGNOSIS — D469 Myelodysplastic syndrome, unspecified: Secondary | ICD-10-CM | POA: Diagnosis not present

## 2012-09-19 LAB — CBC WITH DIFFERENTIAL/PLATELET
Eosinophils Absolute: 0.2 10*3/uL (ref 0.0–0.5)
HGB: 11.3 g/dL — ABNORMAL LOW (ref 11.6–15.9)
LYMPH%: 17.5 % (ref 14.0–49.7)
MONO#: 0.5 10*3/uL (ref 0.1–0.9)
NEUT#: 4 10*3/uL (ref 1.5–6.5)
Platelets: 250 10*3/uL (ref 145–400)
RBC: 3.5 10*6/uL — ABNORMAL LOW (ref 3.70–5.45)
RDW: 17.9 % — ABNORMAL HIGH (ref 11.2–14.5)
WBC: 5.9 10*3/uL (ref 3.9–10.3)

## 2012-09-19 MED ORDER — DARBEPOETIN ALFA-POLYSORBATE 300 MCG/0.6ML IJ SOLN: 300.0000 ug | Freq: Once | INTRAMUSCULAR | Status: DC

## 2012-10-03 ENCOUNTER — Telehealth: Payer: Self-pay | Admitting: *Deleted

## 2012-10-03 NOTE — Telephone Encounter (Signed)
Addendum:  Fax # for Dr. Buford Dresser is [phone] & ph # is [phone].

## 2012-10-03 NOTE — Telephone Encounter (Signed)
Received call from pt's daughter, [name] requesting recent labs from ED & notes be sent to Dr Buford Dresser in [place].  Pt has an appt Monday & MD never received anything.  ED note with labs & last MD note faxed to Dr. Buford Dresser @ [phone].

## 2012-10-03 NOTE — Telephone Encounter (Signed)
Correction:  Fax # for Dr. Buford Dresser is [phone]

## 2012-10-06 DIAGNOSIS — E039 Hypothyroidism, unspecified: Secondary | ICD-10-CM | POA: Diagnosis not present

## 2012-10-06 DIAGNOSIS — R609 Edema, unspecified: Secondary | ICD-10-CM | POA: Diagnosis not present

## 2012-10-06 DIAGNOSIS — I4891 Unspecified atrial fibrillation: Secondary | ICD-10-CM | POA: Diagnosis not present

## 2012-10-17 ENCOUNTER — Other Ambulatory Visit (HOSPITAL_BASED_OUTPATIENT_CLINIC_OR_DEPARTMENT_OTHER): Payer: Medicare Other | Admitting: Lab

## 2012-10-17 ENCOUNTER — Ambulatory Visit (HOSPITAL_BASED_OUTPATIENT_CLINIC_OR_DEPARTMENT_OTHER): Payer: Medicare Other

## 2012-10-17 VITALS — BP 152/80 | HR 76 | Temp 96.7°F

## 2012-10-17 DIAGNOSIS — D469 Myelodysplastic syndrome, unspecified: Secondary | ICD-10-CM

## 2012-10-17 LAB — CBC WITH DIFFERENTIAL/PLATELET
Eosinophils Absolute: 0.3 10*3/uL (ref 0.0–0.5)
HCT: 32.7 % — ABNORMAL LOW (ref 34.8–46.6)
LYMPH%: 16.3 % (ref 14.0–49.7)
MONO#: 0.4 10*3/uL (ref 0.1–0.9)
NEUT#: 3.9 10*3/uL (ref 1.5–6.5)
NEUT%: 69.9 % (ref 38.4–76.8)
Platelets: 221 10*3/uL (ref 145–400)
WBC: 5.6 10*3/uL (ref 3.9–10.3)

## 2012-10-17 MED ORDER — DARBEPOETIN ALFA-POLYSORBATE 300 MCG/0.6ML IJ SOLN
300.0000 ug | Freq: Once | INTRAMUSCULAR | Status: AC
  Administered 2012-10-17: 300 ug via SUBCUTANEOUS
  Filled 2012-10-17: qty 0.6

## 2012-11-14 ENCOUNTER — Ambulatory Visit: Payer: Medicare Other

## 2012-11-14 ENCOUNTER — Other Ambulatory Visit (HOSPITAL_BASED_OUTPATIENT_CLINIC_OR_DEPARTMENT_OTHER): Payer: Medicare Other | Admitting: Lab

## 2012-11-14 DIAGNOSIS — D469 Myelodysplastic syndrome, unspecified: Secondary | ICD-10-CM

## 2012-11-14 LAB — CBC WITH DIFFERENTIAL/PLATELET
BASO%: 1.2 % (ref 0.0–2.0)
EOS%: 4.6 % (ref 0.0–7.0)
HCT: 35.2 % (ref 34.8–46.6)
LYMPH%: 17.2 % (ref 14.0–49.7)
MCH: 30.6 pg (ref 25.1–34.0)
MCHC: 32.4 g/dL (ref 31.5–36.0)
NEUT%: 66.3 % (ref 38.4–76.8)
Platelets: 234 10*3/uL (ref 145–400)

## 2012-11-14 MED ORDER — DARBEPOETIN ALFA-POLYSORBATE 300 MCG/0.6ML IJ SOLN: 300.0000 ug | Freq: Once | INTRAMUSCULAR | Status: DC

## 2012-12-12 ENCOUNTER — Ambulatory Visit (HOSPITAL_BASED_OUTPATIENT_CLINIC_OR_DEPARTMENT_OTHER): Payer: Medicare Other

## 2012-12-12 ENCOUNTER — Other Ambulatory Visit (HOSPITAL_BASED_OUTPATIENT_CLINIC_OR_DEPARTMENT_OTHER): Payer: Medicare Other | Admitting: Lab

## 2012-12-12 VITALS — BP 125/70 | HR 78 | Temp 98.1°F

## 2012-12-12 DIAGNOSIS — D469 Myelodysplastic syndrome, unspecified: Secondary | ICD-10-CM

## 2012-12-12 LAB — CBC WITH DIFFERENTIAL/PLATELET
Basophils Absolute: 0.1 10*3/uL (ref 0.0–0.1)
EOS%: 7 % (ref 0.0–7.0)
HGB: 10.6 g/dL — ABNORMAL LOW (ref 11.6–15.9)
MCH: 30 pg (ref 25.1–34.0)
MCV: 94.3 fL (ref 79.5–101.0)
MONO%: 8.1 % (ref 0.0–14.0)
RBC: 3.53 10*6/uL — ABNORMAL LOW (ref 3.70–5.45)
RDW: 17.3 % — ABNORMAL HIGH (ref 11.2–14.5)

## 2012-12-12 MED ORDER — DARBEPOETIN ALFA-POLYSORBATE 300 MCG/0.6ML IJ SOLN
300.0000 ug | Freq: Once | INTRAMUSCULAR | Status: AC
  Administered 2012-12-12: 300 ug via SUBCUTANEOUS
  Filled 2012-12-12: qty 0.6

## 2013-01-08 ENCOUNTER — Telehealth: Payer: Self-pay | Admitting: *Deleted

## 2013-01-08 NOTE — Telephone Encounter (Signed)
Per patient family member I have moved appt from tomorrow to Monday. JMW

## 2013-01-09 ENCOUNTER — Other Ambulatory Visit: Payer: Medicare Other | Admitting: Lab

## 2013-01-09 ENCOUNTER — Ambulatory Visit: Payer: Medicare Other

## 2013-01-12 ENCOUNTER — Other Ambulatory Visit (HOSPITAL_BASED_OUTPATIENT_CLINIC_OR_DEPARTMENT_OTHER): Payer: Medicare Other | Admitting: Lab

## 2013-01-12 ENCOUNTER — Ambulatory Visit: Payer: Medicare Other

## 2013-01-12 DIAGNOSIS — D469 Myelodysplastic syndrome, unspecified: Secondary | ICD-10-CM

## 2013-01-12 LAB — CBC WITH DIFFERENTIAL/PLATELET
BASO%: 0.9 % (ref 0.0–2.0)
EOS%: 6.2 % (ref 0.0–7.0)
LYMPH%: 16.3 % (ref 14.0–49.7)
MCH: 30.6 pg (ref 25.1–34.0)
MCHC: 32.4 g/dL (ref 31.5–36.0)
MCV: 94.4 fL (ref 79.5–101.0)
MONO#: 0.5 10*3/uL (ref 0.1–0.9)
MONO%: 8.4 % (ref 0.0–14.0)
Platelets: 203 10*3/uL (ref 145–400)
RBC: 3.73 10*6/uL (ref 3.70–5.45)
WBC: 5.5 10*3/uL (ref 3.9–10.3)
nRBC: 0 % (ref 0–0)

## 2013-01-12 MED ORDER — DARBEPOETIN ALFA-POLYSORBATE 300 MCG/0.6ML IJ SOLN: 300.0000 ug | Freq: Once | INTRAMUSCULAR | Status: DC

## 2013-01-19 ENCOUNTER — Telehealth: Payer: Self-pay | Admitting: Oncology

## 2013-01-19 NOTE — Telephone Encounter (Signed)
pt dauther called to cancel 3.7.14 appt.Marland KitchenMarland KitchenDone pt did not want to keep this appt 2weeks in a row to see PA...incorrect

## 2013-01-30 ENCOUNTER — Ambulatory Visit: Payer: Medicare Other | Admitting: Nurse Practitioner

## 2013-02-06 ENCOUNTER — Telehealth: Payer: Self-pay | Admitting: Oncology

## 2013-02-06 ENCOUNTER — Other Ambulatory Visit (HOSPITAL_BASED_OUTPATIENT_CLINIC_OR_DEPARTMENT_OTHER): Payer: Medicare Other

## 2013-02-06 ENCOUNTER — Ambulatory Visit (HOSPITAL_BASED_OUTPATIENT_CLINIC_OR_DEPARTMENT_OTHER): Payer: Medicare Other | Admitting: Nurse Practitioner

## 2013-02-06 VITALS — BP 141/65 | HR 72 | Temp 97.2°F | Resp 18 | Ht 65.0 in | Wt 150.9 lb

## 2013-02-06 DIAGNOSIS — D469 Myelodysplastic syndrome, unspecified: Secondary | ICD-10-CM

## 2013-02-06 DIAGNOSIS — D649 Anemia, unspecified: Secondary | ICD-10-CM

## 2013-02-06 DIAGNOSIS — I4891 Unspecified atrial fibrillation: Secondary | ICD-10-CM | POA: Diagnosis not present

## 2013-02-06 LAB — CBC WITH DIFFERENTIAL/PLATELET
BASO%: 1.2 % (ref 0.0–2.0)
EOS%: 5.9 % (ref 0.0–7.0)
MCH: 31.3 pg (ref 25.1–34.0)
MCHC: 33.6 g/dL (ref 31.5–36.0)
MONO%: 7.1 % (ref 0.0–14.0)
RBC: 3.47 10*6/uL — ABNORMAL LOW (ref 3.70–5.45)
RDW: 17.7 % — ABNORMAL HIGH (ref 11.2–14.5)
lymph#: 0.8 10*3/uL — ABNORMAL LOW (ref 0.9–3.3)

## 2013-02-06 MED ORDER — DARBEPOETIN ALFA-POLYSORBATE 300 MCG/0.6ML IJ SOLN
300.0000 ug | Freq: Once | INTRAMUSCULAR | Status: AC
  Administered 2013-02-06: 300 ug via SUBCUTANEOUS
  Filled 2013-02-06: qty 0.6

## 2013-02-06 NOTE — Telephone Encounter (Signed)
gv and printed appt schedule for April thru Dec...per pt requested particular days for inj appts

## 2013-02-06 NOTE — Progress Notes (Signed)
OFFICE PROGRESS NOTE  Interval history:  Gina Wilkinson is a 79 year old woman with MDS. She is maintained on intermittent Aranesp. She is seen today for scheduled followup.  She has no complaints. She states that she feels well. She has a good appetite and overall good energy level.   Objective: Blood pressure 141/65, pulse 72, temperature 97.2 F (36.2 C), temperature source Oral, resp. rate 18, height 5\' 5"  (1.651 m), weight 150 lb 14.4 oz (68.448 kg).  Oropharynx is without thrush or ulceration. Lungs are clear. Irregular cardiac rhythm. Rate is within normal limits. Abdomen is soft and nontender. No organomegaly. Trace lower leg edema bilaterally.  Lab Results: Lab Results  Component Value Date   WBC 5.1 02/06/2013   HGB 10.9* 02/06/2013   HCT 32.4* 02/06/2013   MCV 93.4 02/06/2013   PLT 212 02/06/2013    Chemistry:    Chemistry      Component Value Date/Time   NA 133* 09/06/2012 1250   NA 140 07/25/2012 1005   K 3.9 09/06/2012 1250   K 4.6 07/25/2012 1005   CL 97 09/06/2012 1250   CL 105 07/25/2012 1005   CO2 29 09/06/2012 1250   CO2 29 07/25/2012 1005   BUN 17 09/06/2012 1250   BUN 25.0 07/25/2012 1005   CREATININE 0.94 09/06/2012 1250   CREATININE 1.2* 07/25/2012 1005   CREATININE 1.10 03/15/2009 1014      Component Value Date/Time   CALCIUM 9.2 09/06/2012 1250   CALCIUM 9.6 07/25/2012 1005   ALKPHOS 64 09/06/2012 1250   ALKPHOS 78 07/25/2012 1005   AST 25 09/06/2012 1250   AST 21 07/25/2012 1005   ALT 14 09/06/2012 1250   ALT 16 07/25/2012 1005   BILITOT 1.2 09/06/2012 1250   BILITOT 1.40* 07/25/2012 1005       Studies/Results: No results found.  Medications: I have reviewed the patient's current medications.  Assessment/Plan:  1. MDS maintained on intermittent Aranesp support.  2. Chronic atrial fibrillation. 3. Hypothyroid on replacement.   Lonna Cobb ANP/GNP-BC    CC Dr. Drucie Opitz in [facility]; Dr. Janace Hoard; Dr. Sharrell Ku.

## 2013-03-06 ENCOUNTER — Ambulatory Visit: Payer: Medicare Other

## 2013-03-06 ENCOUNTER — Other Ambulatory Visit (HOSPITAL_BASED_OUTPATIENT_CLINIC_OR_DEPARTMENT_OTHER): Payer: Medicare Other | Admitting: Lab

## 2013-03-06 DIAGNOSIS — D469 Myelodysplastic syndrome, unspecified: Secondary | ICD-10-CM | POA: Diagnosis not present

## 2013-03-06 LAB — CBC WITH DIFFERENTIAL/PLATELET
Basophils Absolute: 0.1 10*3/uL (ref 0.0–0.1)
EOS%: 6.4 % (ref 0.0–7.0)
Eosinophils Absolute: 0.3 10*3/uL (ref 0.0–0.5)
HGB: 11.2 g/dL — ABNORMAL LOW (ref 11.6–15.9)
NEUT#: 3.3 10*3/uL (ref 1.5–6.5)
RBC: 3.67 10*6/uL — ABNORMAL LOW (ref 3.70–5.45)
RDW: 17.8 % — ABNORMAL HIGH (ref 11.2–14.5)
lymph#: 1 10*3/uL (ref 0.9–3.3)
nRBC: 0 % (ref 0–0)

## 2013-03-06 MED ORDER — DARBEPOETIN ALFA-POLYSORBATE 300 MCG/0.6ML IJ SOLN: 300.0000 ug | Freq: Once | INTRAMUSCULAR | Status: DC

## 2013-04-03 ENCOUNTER — Ambulatory Visit (HOSPITAL_BASED_OUTPATIENT_CLINIC_OR_DEPARTMENT_OTHER): Payer: Medicare Other

## 2013-04-03 ENCOUNTER — Other Ambulatory Visit (HOSPITAL_BASED_OUTPATIENT_CLINIC_OR_DEPARTMENT_OTHER): Payer: Medicare Other | Admitting: Lab

## 2013-04-03 VITALS — BP 160/82 | HR 76 | Temp 98.2°F

## 2013-04-03 DIAGNOSIS — D469 Myelodysplastic syndrome, unspecified: Secondary | ICD-10-CM

## 2013-04-03 LAB — CBC WITH DIFFERENTIAL/PLATELET
Basophils Absolute: 0 10*3/uL (ref 0.0–0.1)
EOS%: 5.6 % (ref 0.0–7.0)
MCH: 30.4 pg (ref 25.1–34.0)
MCV: 94.3 fL (ref 79.5–101.0)
MONO%: 8.4 % (ref 0.0–14.0)
NEUT#: 3.2 10*3/uL (ref 1.5–6.5)
NEUT%: 66.2 % (ref 38.4–76.8)
RDW: 17.9 % — ABNORMAL HIGH (ref 11.2–14.5)
WBC: 4.8 10*3/uL (ref 3.9–10.3)

## 2013-04-03 MED ORDER — DARBEPOETIN ALFA-POLYSORBATE 300 MCG/0.6ML IJ SOLN
300.0000 ug | Freq: Once | INTRAMUSCULAR | Status: AC
  Administered 2013-04-03: 300 ug via SUBCUTANEOUS
  Filled 2013-04-03: qty 0.6

## 2013-05-01 ENCOUNTER — Ambulatory Visit (HOSPITAL_BASED_OUTPATIENT_CLINIC_OR_DEPARTMENT_OTHER): Payer: Medicare Other

## 2013-05-01 ENCOUNTER — Other Ambulatory Visit (HOSPITAL_BASED_OUTPATIENT_CLINIC_OR_DEPARTMENT_OTHER): Payer: Medicare Other

## 2013-05-01 VITALS — BP 125/60 | HR 75 | Temp 98.3°F

## 2013-05-01 DIAGNOSIS — D469 Myelodysplastic syndrome, unspecified: Secondary | ICD-10-CM

## 2013-05-01 LAB — CBC WITH DIFFERENTIAL/PLATELET
EOS%: 5.2 % (ref 0.0–7.0)
MCH: 31.3 pg (ref 25.1–34.0)
MCV: 92.8 fL (ref 79.5–101.0)
MONO%: 8.7 % (ref 0.0–14.0)
RBC: 3.45 10*6/uL — ABNORMAL LOW (ref 3.70–5.45)
RDW: 18.2 % — ABNORMAL HIGH (ref 11.2–14.5)

## 2013-05-01 MED ORDER — DARBEPOETIN ALFA-POLYSORBATE 300 MCG/0.6ML IJ SOLN
300.0000 ug | Freq: Once | INTRAMUSCULAR | Status: AC
  Administered 2013-05-01: 300 ug via SUBCUTANEOUS
  Filled 2013-05-01: qty 0.6

## 2013-05-11 DIAGNOSIS — I10 Essential (primary) hypertension: Secondary | ICD-10-CM | POA: Diagnosis not present

## 2013-05-11 DIAGNOSIS — Q068 Other specified congenital malformations of spinal cord: Secondary | ICD-10-CM | POA: Diagnosis not present

## 2013-05-11 DIAGNOSIS — I4891 Unspecified atrial fibrillation: Secondary | ICD-10-CM | POA: Diagnosis not present

## 2013-05-11 DIAGNOSIS — E039 Hypothyroidism, unspecified: Secondary | ICD-10-CM | POA: Diagnosis not present

## 2013-06-01 ENCOUNTER — Ambulatory Visit: Payer: Medicare Other

## 2013-06-01 ENCOUNTER — Other Ambulatory Visit (HOSPITAL_BASED_OUTPATIENT_CLINIC_OR_DEPARTMENT_OTHER): Payer: Medicare Other | Admitting: Lab

## 2013-06-01 DIAGNOSIS — D469 Myelodysplastic syndrome, unspecified: Secondary | ICD-10-CM

## 2013-06-01 LAB — CBC WITH DIFFERENTIAL/PLATELET
Eosinophils Absolute: 0.3 10*3/uL (ref 0.0–0.5)
MONO#: 0.4 10*3/uL (ref 0.1–0.9)
MONO%: 8.8 % (ref 0.0–14.0)
NEUT#: 3.3 10*3/uL (ref 1.5–6.5)
RBC: 3.82 10*6/uL (ref 3.70–5.45)
RDW: 17.1 % — ABNORMAL HIGH (ref 11.2–14.5)
WBC: 5 10*3/uL (ref 3.9–10.3)
nRBC: 0 % (ref 0–0)

## 2013-06-01 NOTE — Progress Notes (Signed)
Aranesp held, hgb 11.5

## 2013-07-03 ENCOUNTER — Ambulatory Visit (HOSPITAL_BASED_OUTPATIENT_CLINIC_OR_DEPARTMENT_OTHER): Payer: Medicare Other

## 2013-07-03 ENCOUNTER — Other Ambulatory Visit (HOSPITAL_BASED_OUTPATIENT_CLINIC_OR_DEPARTMENT_OTHER): Payer: Medicare Other | Admitting: Lab

## 2013-07-03 VITALS — BP 111/48 | HR 67 | Temp 97.5°F

## 2013-07-03 DIAGNOSIS — D469 Myelodysplastic syndrome, unspecified: Secondary | ICD-10-CM

## 2013-07-03 LAB — CBC WITH DIFFERENTIAL/PLATELET
BASO%: 0.7 % (ref 0.0–2.0)
HCT: 33.4 % — ABNORMAL LOW (ref 34.8–46.6)
HGB: 10.6 g/dL — ABNORMAL LOW (ref 11.6–15.9)
MCHC: 31.7 g/dL (ref 31.5–36.0)
MONO#: 0.4 10*3/uL (ref 0.1–0.9)
NEUT%: 68.1 % (ref 38.4–76.8)
RDW: 17.2 % — ABNORMAL HIGH (ref 11.2–14.5)
WBC: 4.4 10*3/uL (ref 3.9–10.3)
lymph#: 0.7 10*3/uL — ABNORMAL LOW (ref 0.9–3.3)

## 2013-07-03 MED ORDER — DARBEPOETIN ALFA-POLYSORBATE 300 MCG/0.6ML IJ SOLN
300.0000 ug | Freq: Once | INTRAMUSCULAR | Status: AC
  Administered 2013-07-03: 300 ug via SUBCUTANEOUS
  Filled 2013-07-03: qty 0.6

## 2013-07-31 ENCOUNTER — Other Ambulatory Visit: Payer: Medicare Other | Admitting: Lab

## 2013-07-31 ENCOUNTER — Ambulatory Visit: Payer: Medicare Other

## 2013-08-07 ENCOUNTER — Ambulatory Visit (HOSPITAL_BASED_OUTPATIENT_CLINIC_OR_DEPARTMENT_OTHER): Payer: Medicare Other

## 2013-08-07 ENCOUNTER — Other Ambulatory Visit (HOSPITAL_BASED_OUTPATIENT_CLINIC_OR_DEPARTMENT_OTHER): Payer: Medicare Other | Admitting: Lab

## 2013-08-07 ENCOUNTER — Ambulatory Visit (HOSPITAL_BASED_OUTPATIENT_CLINIC_OR_DEPARTMENT_OTHER): Payer: Medicare Other | Admitting: Oncology

## 2013-08-07 VITALS — BP 160/80 | HR 62 | Temp 96.8°F | Resp 19 | Ht 65.0 in | Wt 147.6 lb

## 2013-08-07 DIAGNOSIS — D599 Acquired hemolytic anemia, unspecified: Secondary | ICD-10-CM | POA: Diagnosis not present

## 2013-08-07 DIAGNOSIS — D649 Anemia, unspecified: Secondary | ICD-10-CM

## 2013-08-07 DIAGNOSIS — D469 Myelodysplastic syndrome, unspecified: Secondary | ICD-10-CM

## 2013-08-07 LAB — CBC WITH DIFFERENTIAL/PLATELET
Basophils Absolute: 0.1 10*3/uL (ref 0.0–0.1)
EOS%: 6.3 % (ref 0.0–7.0)
Eosinophils Absolute: 0.3 10*3/uL (ref 0.0–0.5)
HCT: 30.4 % — ABNORMAL LOW (ref 34.8–46.6)
HGB: 10.2 g/dL — ABNORMAL LOW (ref 11.6–15.9)
MCH: 31.6 pg (ref 25.1–34.0)
MONO#: 0.4 10*3/uL (ref 0.1–0.9)
NEUT#: 3.3 10*3/uL (ref 1.5–6.5)
NEUT%: 68.5 % (ref 38.4–76.8)
lymph#: 0.7 10*3/uL — ABNORMAL LOW (ref 0.9–3.3)

## 2013-08-07 MED ORDER — DARBEPOETIN ALFA-POLYSORBATE 300 MCG/0.6ML IJ SOLN
300.0000 ug | Freq: Once | INTRAMUSCULAR | Status: AC
  Administered 2013-08-07: 300 ug via SUBCUTANEOUS
  Filled 2013-08-07: qty 0.6

## 2013-08-08 NOTE — Progress Notes (Signed)
Hematology and Oncology Follow Up Visit  Gina Wilkinson [id number] 1920/05/02 79 y.o. 08/08/2013 6:10 PM   Principle Diagnosis: Encounter Diagnoses  Name Primary?  . MDS (myelodysplastic syndrome)   . ACQUIRED HEMOLYTIC ANEMIA UNSPECIFIED   . ANEMIA Yes     Interim History:   Followup visit for this pleasant 79 year old woman with a low-grade myelodysplastic syndrome characterized by isolated normochromic anemia.  Bone marrow done 02/01/2009 was hypercellular for age. Mild dyserythropoiesis. 3% blasts. Normal cytogenetics. No ring sideroblasts. Hemoglobin 9.8 at that time with platelet count 367,000. Ferritin 329. Erythropoietin 69.9. 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 She has had a durable response to intermittent erythropoietin injections currently on Aranesp 300 mcg every 4 weeks. We have been able to maintain hemoglobin at 10 g or above.  Since last visit, she had a brief deterioration. She was started on lisinopril, hydrochlorothiazide. She became anorectic. Dehydrated. Lost about 10 pounds. The  Dyazide component was stopped and she slowly got back to her baseline.  Medications: reviewed  Allergies:  Allergies  Allergen Reactions  . Levaquin [Levofloxacin] Anaphylaxis  . Penicillins Anaphylaxis  . Sulfonamide Derivatives Anaphylaxis    Review of Systems: Constitutional:   Resolved constitutional symptoms HEENT no sore throat Respiratory: No cough or dyspnea Cardiovascular:  No chest pain or palpitations Gastrointestinal: No change in bowel habit. She uses intermittent laxatives. Genito-Urinary: No urinary tract symptoms Musculoskeletal: Chronic arthritis pain Neurologic: No headache or change in vision Skin: No rash or ecchymosis Remaining ROS negative.     Physical Exam: Blood pressure 160/80, pulse 62, temperature 96.8 F (36 C), temperature source Oral, resp.  rate 19, height 5\' 5"  (1.651 m), weight 147 lb 9.6 oz (66.951 kg). Wt Readings from Last 3 Encounters:  08/07/13 147 lb 9.6 oz (66.951 kg)  02/06/13 150 lb 14.4 oz (68.448 kg)  07/25/12 157 lb 8 oz (71.442 kg)     General appearance: Thin Caucasian woman HENNT: Pharynx no erythema, exudate, or ulcer, no thyromegaly, Lymph nodes: No cervical, supraclavicular, or axillary adenopathy Breasts: Lungs: Clear to auscultation resonant to percussion Heart: Regular rhythm no murmur no gallop Abdomen: Soft, nontender, no mass, no organomegaly Extremities: No edema, no calf tenderness he Musculoskeletal: Osteoarthritic changes of the fingers GU: Vascular: No carotid bruits, no cyanosis Neurologic: Mental status intact, cranial nerves grossly normal, motor strength 5 over 5, reflexes 1+ symmetric Skin: No rash or ecchymosis  Lab Results: Lab Results  Component Value Date   WBC 4.9 08/07/2013   HGB 10.2* 08/07/2013   HCT 30.4* 08/07/2013   MCV 93.8 08/07/2013   PLT 202 08/07/2013     Chemistry      Component Value Date/Time   NA 133* 09/06/2012 1250   NA 140 07/25/2012 1005   K 3.9 09/06/2012 1250   K 4.6 07/25/2012 1005   CL 97 09/06/2012 1250   CL 105 07/25/2012 1005   CO2 29 09/06/2012 1250   CO2 29 07/25/2012 1005   BUN 17 09/06/2012 1250   BUN 25.0 07/25/2012 1005   CREATININE 0.94 09/06/2012 1250   CREATININE 1.2* 07/25/2012 1005   CREATININE 1.10 03/15/2009 1014      Component Value Date/Time   CALCIUM 9.2 09/06/2012 1250   CALCIUM 9.6 07/25/2012 1005   ALKPHOS 64 09/06/2012 1250   ALKPHOS 78 07/25/2012 1005   AST 25 09/06/2012 1250   AST 21 07/25/2012 1005  ALT 14 09/06/2012 1250   ALT 16 07/25/2012 1005   BILITOT 1.2 09/06/2012 1250   BILITOT 1.40* 07/25/2012 1005     Impression: #1. Low grade myelodysplastic syndrome She remains stable on intermittent Aranesp injections. Plan: Continue the same  #2. Essential hypertension #3. Chronic atrial fibrillation #4. Hypothyroid  on replacement    CC:. Dr. Venetia Night [phone] [address] during [place] fax [phone]   Levert Feinstein, MD 9/13/20146:10 PM

## 2013-08-11 ENCOUNTER — Telehealth: Payer: Self-pay | Admitting: Oncology

## 2013-08-11 NOTE — Telephone Encounter (Signed)
lmonvm of the pt advising her to pick up the rest of her schedules for the months of oct-march 2015 including the md visit.

## 2013-09-04 ENCOUNTER — Ambulatory Visit (HOSPITAL_BASED_OUTPATIENT_CLINIC_OR_DEPARTMENT_OTHER): Payer: Medicare Other

## 2013-09-04 ENCOUNTER — Other Ambulatory Visit (HOSPITAL_BASED_OUTPATIENT_CLINIC_OR_DEPARTMENT_OTHER): Payer: Medicare Other | Admitting: Lab

## 2013-09-04 ENCOUNTER — Encounter (INDEPENDENT_AMBULATORY_CARE_PROVIDER_SITE_OTHER): Payer: Self-pay

## 2013-09-04 VITALS — BP 125/65 | HR 76 | Temp 98.1°F

## 2013-09-04 DIAGNOSIS — D649 Anemia, unspecified: Secondary | ICD-10-CM

## 2013-09-04 DIAGNOSIS — D472 Monoclonal gammopathy: Secondary | ICD-10-CM | POA: Diagnosis not present

## 2013-09-04 DIAGNOSIS — D469 Myelodysplastic syndrome, unspecified: Secondary | ICD-10-CM | POA: Diagnosis not present

## 2013-09-04 DIAGNOSIS — D599 Acquired hemolytic anemia, unspecified: Secondary | ICD-10-CM

## 2013-09-04 LAB — COMPREHENSIVE METABOLIC PANEL (CC13)
Anion Gap: 8 mEq/L (ref 3–11)
CO2: 27 mEq/L (ref 22–29)
Calcium: 9.4 mg/dL (ref 8.4–10.4)
Chloride: 106 mEq/L (ref 98–109)
Creatinine: 1 mg/dL (ref 0.6–1.1)
Glucose: 131 mg/dl (ref 70–140)
Total Bilirubin: 1.38 mg/dL — ABNORMAL HIGH (ref 0.20–1.20)
Total Protein: 7.2 g/dL (ref 6.4–8.3)

## 2013-09-04 LAB — CBC WITH DIFFERENTIAL/PLATELET
Basophils Absolute: 0.1 10*3/uL (ref 0.0–0.1)
EOS%: 6 % (ref 0.0–7.0)
Eosinophils Absolute: 0.3 10*3/uL (ref 0.0–0.5)
HCT: 31.6 % — ABNORMAL LOW (ref 34.8–46.6)
HGB: 10.4 g/dL — ABNORMAL LOW (ref 11.6–15.9)
MCH: 31.4 pg (ref 25.1–34.0)
MCV: 95.8 fL (ref 79.5–101.0)
MONO%: 9.1 % (ref 0.0–14.0)
NEUT#: 3.2 10*3/uL (ref 1.5–6.5)
NEUT%: 69.1 % (ref 38.4–76.8)
Platelets: 218 10*3/uL (ref 145–400)

## 2013-09-04 MED ORDER — DARBEPOETIN ALFA-POLYSORBATE 300 MCG/0.6ML IJ SOLN
300.0000 ug | Freq: Once | INTRAMUSCULAR | Status: AC
  Administered 2013-09-04: 300 ug via SUBCUTANEOUS
  Filled 2013-09-04: qty 0.6

## 2013-09-10 ENCOUNTER — Encounter: Payer: Self-pay | Admitting: Podiatry

## 2013-09-10 ENCOUNTER — Ambulatory Visit (INDEPENDENT_AMBULATORY_CARE_PROVIDER_SITE_OTHER): Payer: Medicare Other | Admitting: Podiatry

## 2013-09-10 DIAGNOSIS — B351 Tinea unguium: Secondary | ICD-10-CM

## 2013-09-10 NOTE — Progress Notes (Signed)
Subjective:     Patient ID: Gina Wilkinson, female   DOB: 05-13-1920, 79 y.o.   MRN: [id number]  HPI please cut my toenails   Review of Systems     Objective:   Physical Exam     Assessment:     Nails are elongated 1-5 bilateral nonpainful    Plan:     Debridement nailbeds 1-5 bilateral no iatrogenic bleeding noted

## 2013-09-15 ENCOUNTER — Telehealth: Payer: Self-pay | Admitting: Oncology

## 2013-09-15 NOTE — Telephone Encounter (Signed)
returned call and she will call back to give info of initial call

## 2013-10-02 ENCOUNTER — Ambulatory Visit: Payer: Medicare Other

## 2013-10-02 ENCOUNTER — Other Ambulatory Visit (HOSPITAL_BASED_OUTPATIENT_CLINIC_OR_DEPARTMENT_OTHER): Payer: Medicare Other | Admitting: Lab

## 2013-10-02 DIAGNOSIS — D469 Myelodysplastic syndrome, unspecified: Secondary | ICD-10-CM

## 2013-10-02 LAB — CBC WITH DIFFERENTIAL/PLATELET
BASO%: 1 % (ref 0.0–2.0)
Eosinophils Absolute: 0.4 10*3/uL (ref 0.0–0.5)
HCT: 36.5 % (ref 34.8–46.6)
HGB: 11.5 g/dL — ABNORMAL LOW (ref 11.6–15.9)
LYMPH%: 12.5 % — ABNORMAL LOW (ref 14.0–49.7)
MCHC: 31.5 g/dL (ref 31.5–36.0)
MONO#: 0.6 10*3/uL (ref 0.1–0.9)
MONO%: 10.2 % (ref 0.0–14.0)
NEUT#: 4.1 10*3/uL (ref 1.5–6.5)
NEUT%: 70.1 % (ref 38.4–76.8)
Platelets: 246 10*3/uL (ref 145–400)
RDW: 17.5 % — ABNORMAL HIGH (ref 11.2–14.5)
WBC: 5.8 10*3/uL (ref 3.9–10.3)
lymph#: 0.7 10*3/uL — ABNORMAL LOW (ref 0.9–3.3)

## 2013-10-02 NOTE — Progress Notes (Signed)
Hgb  11.5  ;  Hct  36.5  Today.  Pt did not meet parameters for Aranesp.  Pt had already left the clinic before nurse could inform pt.

## 2013-10-30 ENCOUNTER — Other Ambulatory Visit (HOSPITAL_BASED_OUTPATIENT_CLINIC_OR_DEPARTMENT_OTHER): Payer: Medicare Other | Admitting: Lab

## 2013-10-30 ENCOUNTER — Ambulatory Visit: Payer: Medicare Other

## 2013-10-30 DIAGNOSIS — D469 Myelodysplastic syndrome, unspecified: Secondary | ICD-10-CM

## 2013-10-30 LAB — CBC WITH DIFFERENTIAL/PLATELET
Basophils Absolute: 0.1 10*3/uL (ref 0.0–0.1)
EOS%: 11.2 % — ABNORMAL HIGH (ref 0.0–7.0)
HCT: 34.1 % — ABNORMAL LOW (ref 34.8–46.6)
HGB: 11.2 g/dL — ABNORMAL LOW (ref 11.6–15.9)
LYMPH%: 10.8 % — ABNORMAL LOW (ref 14.0–49.7)
MCH: 31.5 pg (ref 25.1–34.0)
MCV: 95.7 fL (ref 79.5–101.0)
MONO#: 0.6 10*3/uL (ref 0.1–0.9)
NEUT%: 67.3 % (ref 38.4–76.8)
Platelets: 270 10*3/uL (ref 145–400)
lymph#: 0.7 10*3/uL — ABNORMAL LOW (ref 0.9–3.3)

## 2013-10-30 MED ORDER — DARBEPOETIN ALFA-POLYSORBATE 300 MCG/0.6ML IJ SOLN: 300.0000 ug | Freq: Once | INTRAMUSCULAR | Status: DC

## 2013-11-03 DIAGNOSIS — I4891 Unspecified atrial fibrillation: Secondary | ICD-10-CM | POA: Diagnosis not present

## 2013-11-03 DIAGNOSIS — D649 Anemia, unspecified: Secondary | ICD-10-CM | POA: Diagnosis not present

## 2013-11-03 DIAGNOSIS — R0602 Shortness of breath: Secondary | ICD-10-CM | POA: Diagnosis not present

## 2013-11-03 DIAGNOSIS — N390 Urinary tract infection, site not specified: Secondary | ICD-10-CM | POA: Diagnosis not present

## 2013-11-03 DIAGNOSIS — E039 Hypothyroidism, unspecified: Secondary | ICD-10-CM | POA: Diagnosis not present

## 2013-11-03 DIAGNOSIS — R609 Edema, unspecified: Secondary | ICD-10-CM | POA: Diagnosis not present

## 2013-11-03 DIAGNOSIS — J90 Pleural effusion, not elsewhere classified: Secondary | ICD-10-CM | POA: Diagnosis not present

## 2013-11-03 DIAGNOSIS — I509 Heart failure, unspecified: Secondary | ICD-10-CM | POA: Diagnosis not present

## 2013-11-03 DIAGNOSIS — E559 Vitamin D deficiency, unspecified: Secondary | ICD-10-CM | POA: Diagnosis not present

## 2013-11-03 DIAGNOSIS — L02419 Cutaneous abscess of limb, unspecified: Secondary | ICD-10-CM | POA: Diagnosis not present

## 2013-11-03 DIAGNOSIS — R413 Other amnesia: Secondary | ICD-10-CM | POA: Diagnosis not present

## 2013-11-04 ENCOUNTER — Other Ambulatory Visit (HOSPITAL_COMMUNITY): Payer: Self-pay | Admitting: *Deleted

## 2013-11-04 DIAGNOSIS — I509 Heart failure, unspecified: Secondary | ICD-10-CM

## 2013-11-04 DIAGNOSIS — R0602 Shortness of breath: Secondary | ICD-10-CM

## 2013-11-05 ENCOUNTER — Ambulatory Visit (HOSPITAL_COMMUNITY)
Admission: RE | Admit: 2013-11-05 | Discharge: 2013-11-05 | Disposition: A | Discharge status: Home / Self Care | Payer: Medicare Other | Source: Ambulatory Visit | Attending: Cardiovascular Disease | Admitting: Cardiovascular Disease

## 2013-11-05 DIAGNOSIS — I509 Heart failure, unspecified: Secondary | ICD-10-CM | POA: Diagnosis not present

## 2013-11-05 DIAGNOSIS — I517 Cardiomegaly: Secondary | ICD-10-CM | POA: Diagnosis not present

## 2013-11-05 DIAGNOSIS — I079 Rheumatic tricuspid valve disease, unspecified: Secondary | ICD-10-CM | POA: Insufficient documentation

## 2013-11-05 DIAGNOSIS — I080 Rheumatic disorders of both mitral and aortic valves: Secondary | ICD-10-CM | POA: Diagnosis not present

## 2013-11-05 DIAGNOSIS — R0602 Shortness of breath: Secondary | ICD-10-CM

## 2013-11-05 NOTE — Progress Notes (Signed)
  Echocardiogram 2D Echocardiogram has been performed.  Jorje Guild 11/05/2013, 10:43 AM

## 2013-11-09 ENCOUNTER — Emergency Department (HOSPITAL_COMMUNITY): Payer: Medicare Other

## 2013-11-09 ENCOUNTER — Encounter (HOSPITAL_COMMUNITY): Payer: Self-pay | Admitting: Emergency Medicine

## 2013-11-09 ENCOUNTER — Inpatient Hospital Stay (HOSPITAL_COMMUNITY)
Admission: EM | Admit: 2013-11-09 | Discharge: 2013-11-18 | DRG: 480 | Disposition: A | Discharge status: Skilled Nursing Facility | Payer: Medicare Other | Attending: Internal Medicine | Admitting: Internal Medicine

## 2013-11-09 DIAGNOSIS — R799 Abnormal finding of blood chemistry, unspecified: Secondary | ICD-10-CM | POA: Diagnosis not present

## 2013-11-09 DIAGNOSIS — S7290XD Unspecified fracture of unspecified femur, subsequent encounter for closed fracture with routine healing: Secondary | ICD-10-CM | POA: Diagnosis not present

## 2013-11-09 DIAGNOSIS — Y92009 Unspecified place in unspecified non-institutional (private) residence as the place of occurrence of the external cause: Secondary | ICD-10-CM

## 2013-11-09 DIAGNOSIS — Z7982 Long term (current) use of aspirin: Secondary | ICD-10-CM | POA: Diagnosis not present

## 2013-11-09 DIAGNOSIS — J9620 Acute and chronic respiratory failure, unspecified whether with hypoxia or hypercapnia: Secondary | ICD-10-CM | POA: Diagnosis not present

## 2013-11-09 DIAGNOSIS — Z882 Allergy status to sulfonamides status: Secondary | ICD-10-CM

## 2013-11-09 DIAGNOSIS — E8779 Other fluid overload: Secondary | ICD-10-CM | POA: Diagnosis not present

## 2013-11-09 DIAGNOSIS — I5033 Acute on chronic diastolic (congestive) heart failure: Secondary | ICD-10-CM | POA: Diagnosis not present

## 2013-11-09 DIAGNOSIS — M6281 Muscle weakness (generalized): Secondary | ICD-10-CM | POA: Diagnosis not present

## 2013-11-09 DIAGNOSIS — L97809 Non-pressure chronic ulcer of other part of unspecified lower leg with unspecified severity: Secondary | ICD-10-CM | POA: Diagnosis present

## 2013-11-09 DIAGNOSIS — S72009A Fracture of unspecified part of neck of unspecified femur, initial encounter for closed fracture: Secondary | ICD-10-CM

## 2013-11-09 DIAGNOSIS — I129 Hypertensive chronic kidney disease with stage 1 through stage 4 chronic kidney disease, or unspecified chronic kidney disease: Secondary | ICD-10-CM | POA: Diagnosis present

## 2013-11-09 DIAGNOSIS — R5381 Other malaise: Secondary | ICD-10-CM | POA: Diagnosis present

## 2013-11-09 DIAGNOSIS — S7290XA Unspecified fracture of unspecified femur, initial encounter for closed fracture: Principal | ICD-10-CM | POA: Diagnosis present

## 2013-11-09 DIAGNOSIS — G3184 Mild cognitive impairment, so stated: Secondary | ICD-10-CM | POA: Diagnosis present

## 2013-11-09 DIAGNOSIS — I621 Nontraumatic extradural hemorrhage: Secondary | ICD-10-CM | POA: Diagnosis not present

## 2013-11-09 DIAGNOSIS — D638 Anemia in other chronic diseases classified elsewhere: Secondary | ICD-10-CM | POA: Diagnosis present

## 2013-11-09 DIAGNOSIS — E079 Disorder of thyroid, unspecified: Secondary | ICD-10-CM | POA: Diagnosis present

## 2013-11-09 DIAGNOSIS — S72002P Fracture of unspecified part of neck of left femur, subsequent encounter for closed fracture with malunion: Secondary | ICD-10-CM

## 2013-11-09 DIAGNOSIS — D649 Anemia, unspecified: Secondary | ICD-10-CM | POA: Diagnosis not present

## 2013-11-09 DIAGNOSIS — R7989 Other specified abnormal findings of blood chemistry: Secondary | ICD-10-CM | POA: Diagnosis present

## 2013-11-09 DIAGNOSIS — R609 Edema, unspecified: Secondary | ICD-10-CM | POA: Diagnosis present

## 2013-11-09 DIAGNOSIS — T465X5A Adverse effect of other antihypertensive drugs, initial encounter: Secondary | ICD-10-CM | POA: Diagnosis present

## 2013-11-09 DIAGNOSIS — E039 Hypothyroidism, unspecified: Secondary | ICD-10-CM | POA: Diagnosis present

## 2013-11-09 DIAGNOSIS — I10 Essential (primary) hypertension: Secondary | ICD-10-CM | POA: Diagnosis not present

## 2013-11-09 DIAGNOSIS — Z01810 Encounter for preprocedural cardiovascular examination: Secondary | ICD-10-CM | POA: Diagnosis not present

## 2013-11-09 DIAGNOSIS — Z66 Do not resuscitate: Secondary | ICD-10-CM | POA: Diagnosis present

## 2013-11-09 DIAGNOSIS — M25559 Pain in unspecified hip: Secondary | ICD-10-CM | POA: Diagnosis not present

## 2013-11-09 DIAGNOSIS — J9600 Acute respiratory failure, unspecified whether with hypoxia or hypercapnia: Secondary | ICD-10-CM | POA: Diagnosis not present

## 2013-11-09 DIAGNOSIS — W1811XA Fall from or off toilet without subsequent striking against object, initial encounter: Secondary | ICD-10-CM | POA: Diagnosis present

## 2013-11-09 DIAGNOSIS — L02419 Cutaneous abscess of limb, unspecified: Secondary | ICD-10-CM | POA: Diagnosis not present

## 2013-11-09 DIAGNOSIS — Z01818 Encounter for other preprocedural examination: Secondary | ICD-10-CM | POA: Diagnosis not present

## 2013-11-09 DIAGNOSIS — I4510 Unspecified right bundle-branch block: Secondary | ICD-10-CM | POA: Diagnosis present

## 2013-11-09 DIAGNOSIS — I272 Pulmonary hypertension, unspecified: Secondary | ICD-10-CM

## 2013-11-09 DIAGNOSIS — J811 Chronic pulmonary edema: Secondary | ICD-10-CM | POA: Diagnosis not present

## 2013-11-09 DIAGNOSIS — Z96649 Presence of unspecified artificial hip joint: Secondary | ICD-10-CM

## 2013-11-09 DIAGNOSIS — I9589 Other hypotension: Secondary | ICD-10-CM | POA: Diagnosis not present

## 2013-11-09 DIAGNOSIS — I2789 Other specified pulmonary heart diseases: Secondary | ICD-10-CM | POA: Diagnosis not present

## 2013-11-09 DIAGNOSIS — K5900 Constipation, unspecified: Secondary | ICD-10-CM | POA: Diagnosis present

## 2013-11-09 DIAGNOSIS — L03119 Cellulitis of unspecified part of limb: Secondary | ICD-10-CM

## 2013-11-09 DIAGNOSIS — I509 Heart failure, unspecified: Secondary | ICD-10-CM | POA: Diagnosis not present

## 2013-11-09 DIAGNOSIS — I4891 Unspecified atrial fibrillation: Secondary | ICD-10-CM | POA: Diagnosis present

## 2013-11-09 DIAGNOSIS — Z5189 Encounter for other specified aftercare: Secondary | ICD-10-CM | POA: Diagnosis not present

## 2013-11-09 DIAGNOSIS — T84049A Periprosthetic fracture around unspecified internal prosthetic joint, initial encounter: Secondary | ICD-10-CM | POA: Diagnosis not present

## 2013-11-09 DIAGNOSIS — Z9181 History of falling: Secondary | ICD-10-CM | POA: Diagnosis not present

## 2013-11-09 DIAGNOSIS — R269 Unspecified abnormalities of gait and mobility: Secondary | ICD-10-CM | POA: Diagnosis not present

## 2013-11-09 DIAGNOSIS — E440 Moderate protein-calorie malnutrition: Secondary | ICD-10-CM | POA: Diagnosis present

## 2013-11-09 DIAGNOSIS — J9819 Other pulmonary collapse: Secondary | ICD-10-CM | POA: Diagnosis not present

## 2013-11-09 DIAGNOSIS — S72409A Unspecified fracture of lower end of unspecified femur, initial encounter for closed fracture: Secondary | ICD-10-CM | POA: Diagnosis not present

## 2013-11-09 DIAGNOSIS — J9601 Acute respiratory failure with hypoxia: Secondary | ICD-10-CM

## 2013-11-09 DIAGNOSIS — I872 Venous insufficiency (chronic) (peripheral): Secondary | ICD-10-CM | POA: Diagnosis present

## 2013-11-09 DIAGNOSIS — R630 Anorexia: Secondary | ICD-10-CM | POA: Diagnosis present

## 2013-11-09 DIAGNOSIS — I5032 Chronic diastolic (congestive) heart failure: Secondary | ICD-10-CM | POA: Diagnosis present

## 2013-11-09 DIAGNOSIS — N183 Chronic kidney disease, stage 3 unspecified: Secondary | ICD-10-CM | POA: Diagnosis present

## 2013-11-09 DIAGNOSIS — Z6825 Body mass index (BMI) 25.0-25.9, adult: Secondary | ICD-10-CM

## 2013-11-09 DIAGNOSIS — IMO0002 Reserved for concepts with insufficient information to code with codable children: Secondary | ICD-10-CM | POA: Diagnosis not present

## 2013-11-09 DIAGNOSIS — S72309B Unspecified fracture of shaft of unspecified femur, initial encounter for open fracture type I or II: Secondary | ICD-10-CM | POA: Diagnosis not present

## 2013-11-09 DIAGNOSIS — S72002A Fracture of unspecified part of neck of left femur, initial encounter for closed fracture: Secondary | ICD-10-CM

## 2013-11-09 DIAGNOSIS — E871 Hypo-osmolality and hyponatremia: Secondary | ICD-10-CM | POA: Diagnosis present

## 2013-11-09 DIAGNOSIS — I5031 Acute diastolic (congestive) heart failure: Secondary | ICD-10-CM | POA: Diagnosis not present

## 2013-11-09 DIAGNOSIS — I359 Nonrheumatic aortic valve disorder, unspecified: Secondary | ICD-10-CM | POA: Diagnosis present

## 2013-11-09 DIAGNOSIS — D469 Myelodysplastic syndrome, unspecified: Secondary | ICD-10-CM | POA: Diagnosis present

## 2013-11-09 DIAGNOSIS — I482 Chronic atrial fibrillation, unspecified: Secondary | ICD-10-CM

## 2013-11-09 DIAGNOSIS — R0602 Shortness of breath: Secondary | ICD-10-CM | POA: Diagnosis present

## 2013-11-09 DIAGNOSIS — T502X5A Adverse effect of carbonic-anhydrase inhibitors, benzothiadiazides and other diuretics, initial encounter: Secondary | ICD-10-CM | POA: Diagnosis present

## 2013-11-09 DIAGNOSIS — J90 Pleural effusion, not elsewhere classified: Secondary | ICD-10-CM | POA: Diagnosis not present

## 2013-11-09 HISTORY — DX: Fracture of unspecified part of neck of unspecified femur, initial encounter for closed fracture: S72.009A

## 2013-11-09 LAB — CBC WITH DIFFERENTIAL/PLATELET
Basophils Absolute: 0.1 10*3/uL (ref 0.0–0.1)
Eosinophils Absolute: 0.2 10*3/uL (ref 0.0–0.7)
Eosinophils Relative: 2 % (ref 0–5)
HCT: 32 % — ABNORMAL LOW (ref 36.0–46.0)
HCT: 31.6 % — ABNORMAL LOW (ref 36.0–46.0)
Hemoglobin: 10.2 g/dL — ABNORMAL LOW (ref 12.0–15.0)
Hemoglobin: 10.2 g/dL — ABNORMAL LOW (ref 12.0–15.0)
Lymphocytes Relative: 7 % — ABNORMAL LOW (ref 12–46)
Lymphocytes Relative: 7 % — ABNORMAL LOW (ref 12–46)
Lymphs Abs: 0.6 10*3/uL — ABNORMAL LOW (ref 0.7–4.0)
MCH: 30.7 pg (ref 26.0–34.0)
MCV: 95.2 fL (ref 78.0–100.0)
Monocytes Absolute: 0.6 10*3/uL (ref 0.1–1.0)
Monocytes Relative: 8 % (ref 3–12)
Monocytes Relative: 8 % (ref 3–12)
Neutro Abs: 6.4 10*3/uL (ref 1.7–7.7)
Neutro Abs: 7.1 10*3/uL (ref 1.7–7.7)
Neutrophils Relative %: 81 % — ABNORMAL HIGH (ref 43–77)
Neutrophils Relative %: 83 % — ABNORMAL HIGH (ref 43–77)
Platelets: 224 10*3/uL (ref 150–400)
RBC: 3.32 MIL/uL — ABNORMAL LOW (ref 3.87–5.11)
RDW: 18 % — ABNORMAL HIGH (ref 11.5–15.5)
WBC: 7.9 10*3/uL (ref 4.0–10.5)

## 2013-11-09 LAB — BASIC METABOLIC PANEL
CO2: 34 mEq/L — ABNORMAL HIGH (ref 19–32)
Calcium: 9.3 mg/dL (ref 8.4–10.5)
Chloride: 95 mEq/L — ABNORMAL LOW (ref 96–112)
Creatinine, Ser: 1.26 mg/dL — ABNORMAL HIGH (ref 0.50–1.10)
Potassium: 3.8 mEq/L (ref 3.5–5.1)
Sodium: 137 mEq/L (ref 135–145)

## 2013-11-09 LAB — TROPONIN I
Troponin I: <0.3 ng/mL
Troponin I: <0.3 ng/mL (ref <0.30)

## 2013-11-09 LAB — PRO B NATRIURETIC PEPTIDE: Pro B Natriuretic peptide (BNP): 5281 pg/mL — ABNORMAL HIGH (ref 0–450)

## 2013-11-09 MED ORDER — SODIUM CHLORIDE 0.9 % IJ SOLN: 3.0000 mL | INTRAMUSCULAR | Status: DC | PRN

## 2013-11-09 MED ORDER — ONDANSETRON HCL 4 MG/2ML IJ SOLN
4.0000 mg | Freq: Once | INTRAMUSCULAR | Status: AC
  Administered 2013-11-09: 4 mg via INTRAVENOUS
  Filled 2013-11-09: qty 2

## 2013-11-09 MED ORDER — ASPIRIN 81 MG PO CHEW
81.0000 mg | CHEWABLE_TABLET | Freq: Every day | ORAL | Status: DC
  Administered 2013-11-10 – 2013-11-11 (×2): 81 mg via ORAL
  Filled 2013-11-09 (×3): qty 1

## 2013-11-09 MED ORDER — FUROSEMIDE 10 MG/ML IJ SOLN
40.0000 mg | Freq: Two times a day (BID) | INTRAMUSCULAR | Status: DC
  Administered 2013-11-09 – 2013-11-10 (×2): 40 mg via INTRAVENOUS
  Filled 2013-11-09 (×3): qty 4

## 2013-11-09 MED ORDER — METOPROLOL SUCCINATE ER 100 MG PO TB24
100.0000 mg | ORAL_TABLET | Freq: Every day | ORAL | Status: DC
  Filled 2013-11-09: qty 1

## 2013-11-09 MED ORDER — VANCOMYCIN HCL IN DEXTROSE 750-5 MG/150ML-% IV SOLN
750.0000 mg | INTRAVENOUS | Status: DC
  Administered 2013-11-09: 18:00:00 750 mg via INTRAVENOUS
  Filled 2013-11-09 (×2): qty 150

## 2013-11-09 MED ORDER — LEVOTHYROXINE SODIUM 125 MCG PO TABS
125.0000 ug | ORAL_TABLET | Freq: Every day | ORAL | Status: DC
  Administered 2013-11-10 – 2013-11-18 (×8): 125 ug via ORAL
  Filled 2013-11-09 (×11): qty 1

## 2013-11-09 MED ORDER — ONDANSETRON HCL 4 MG/2ML IJ SOLN: 4.0000 mg | Freq: Four times a day (QID) | INTRAMUSCULAR | Status: DC | PRN

## 2013-11-09 MED ORDER — MORPHINE SULFATE 4 MG/ML IJ SOLN: 4.0000 mg | INTRAMUSCULAR | Status: DC | PRN

## 2013-11-09 MED ORDER — SODIUM CHLORIDE 0.9 % IV SOLN
250.0000 mL | INTRAVENOUS | Status: DC | PRN
  Administered 2013-11-12: 14:00:00 via INTRAVENOUS

## 2013-11-09 MED ORDER — SODIUM CHLORIDE 0.9 % IJ SOLN
3.0000 mL | Freq: Two times a day (BID) | INTRAMUSCULAR | Status: DC
  Administered 2013-11-09 – 2013-11-17 (×13): 3 mL via INTRAVENOUS

## 2013-11-09 MED ORDER — ACETAMINOPHEN 325 MG PO TABS
650.0000 mg | ORAL_TABLET | ORAL | Status: DC | PRN
  Administered 2013-11-10 – 2013-11-14 (×8): 650 mg via ORAL
  Filled 2013-11-09 (×6): qty 2

## 2013-11-09 MED ORDER — SODIUM CHLORIDE 0.9 % IV SOLN
INTRAVENOUS | Status: DC
  Administered 2013-11-09: 15:00:00 via INTRAVENOUS

## 2013-11-09 MED ORDER — HEPARIN SODIUM (PORCINE) 5000 UNIT/ML IJ SOLN
5000.0000 [IU] | Freq: Three times a day (TID) | INTRAMUSCULAR | Status: AC
  Administered 2013-11-09 – 2013-11-11 (×7): 5000 [IU] via SUBCUTANEOUS
  Filled 2013-11-09 (×8): qty 1

## 2013-11-09 MED ORDER — HYDROCODONE-ACETAMINOPHEN 5-325 MG PO TABS
1.0000 | ORAL_TABLET | ORAL | Status: DC | PRN
  Filled 2013-11-09: qty 1

## 2013-11-09 NOTE — H&P (Signed)
[facility] History and Physical  AMMA CREAR ZOX:[id number] DOB: 06-26-1920 DOA: 11/09/2013   PCP: Pcp Not In System   Chief Complaint: Mechanical fall with left hip pain  HPI:  79 year old female presented after a mechanical fall at her home today. Her daughter tried to call her on the telephone, but there was no answer. A result, another daughter came to check on her and found the patient on the bathroom floor. There was no syncope. The patient has mild cognitive impairment, and this history is supplemented by the patient's daughter at the bedside. X-ray of the left hip in ED showed a left periprosthetic proximal femur fracture. Orthopedics, Dr. Cleophas Dunker was called.  However, further history reveals that the patient has had increasing lower extremity edema and shortness of breath for approximately 6 days prior to this admission. The patient went to see her primary care doctor on 11/03/2013. The patient was placed on furosemide 40 mg daily. There has been some improvement of the patient's dyspnea and lower extremity edema. Echocardiogram was obtained on 11/05/2013. It showed E. of 60-65%, mild to moderate aortic stenosis, and moderate to severe pulmonary hypertension. The patient has had improvement in her breathing and lower extremity edema. However, she continues to have some dyspnea. She denies any chest pain, fevers, chills, coughing, hemoptysis, nausea, vomiting, diarrhea, dysuria and hematuria, abdominal pain. In addition, the patient has been on cephalexin for approximately 5 days because of the chronic right lower extremity pretibial ulceration that has not been healing well. There was concern for infection.   In the emergency department, the patient was given fluids at 125 cc an hour. The patient was hemodynamically stable without any respiratory distress. At the time of my evaluation and dictation, and serum sodium, potassium, and chloride are pending. Serum creatinine 1.6. CBC  showed hemoglobin 10.2. Assessment/Plan: Acute diastolic CHF -patient is clinically fluid overloaded -Start intravenous furosemide 40 mg twice a day -echo on 11/05/13 showed EF 60-65% with mod-severe pulm HTN -Daily weights -Fluid restriction -Check proBNP -Cycle troponins as the patient's daughter relates a history of intermittent chest discomfort -Place Foley catheter due to the patient's proximal femur fracture, immobility in the setting of aggressive IV diuresis Chronic atrial fibrillation -Patient is on aspirin -Not a warfarin candidate due to myelodysplastic syndrome -Continue metoprolol succinate -She is rate controlled Cellulitis--legs -Although a little difficult to discern in the setting of venous stasis dermatitis, Patient's daughter states that the erythema in her legs are worse than usual -Discontinue cephalexin patient was taking as an outpatient -Start vancomycin -Consult wound care for right pretibial venous stasis ulcer Right proximal femur fracture -I spoke with orthopedics--patient is currently not stable for surgery due to decompensated CHF -Dr. Cleophas Dunker has been consulted Myelodysplastic syndrome  -Patient usually follows Dr. Cyndie Chime -getting intermitten Aranesp RBBB -chronic from previous EKGs      Past Medical History  Diagnosis Date  . MDS (myelodysplastic syndrome) 10/05/2011  . Pneumonia   . Thyroid disease   . Hypertension   . Atrial fibrillation dx Mar. 2010   Past Surgical History  Procedure Laterality Date  . Orif femur fracture  2001    left  . Appendectomy     Social History:  reports that she has never smoked. She has never used smokeless tobacco. She reports that she does not drink alcohol or use illicit drugs.   History reviewed. No pertinent family history.   Allergies  Allergen Reactions  . Levaquin [Levofloxacin] Anaphylaxis  . Penicillins Anaphylaxis  .  Sulfonamide Derivatives Anaphylaxis      Prior to  Admission medications   Medication Sig Start Date End Date Taking? Authorizing Provider  aspirin 81 MG tablet Take 81 mg by mouth daily.   Yes Historical Provider, MD  furosemide (LASIX) 40 MG tablet Take 40 mg by mouth daily.   Yes Historical Provider, MD  levothyroxine (SYNTHROID, LEVOTHROID) 125 MCG tablet Take 125 mcg by mouth daily.   Yes Historical Provider, MD  lisinopril (PRINIVIL,ZESTRIL) 10 MG tablet Take 10 mg by mouth daily.   Yes Historical Provider, MD  metoprolol succinate (TOPROL-XL) 100 MG 24 hr tablet Take 100 mg by mouth daily.  07/12/12  Yes Historical Provider, MD    Review of Systems:  Constitutional:  No weight loss, night sweats, Fevers, chills, fatigue.  Head&Eyes: No headache.  No vision loss.  No eye pain or scotoma ENT:  No Difficulty swallowing,Tooth/dental problems,Sore throat,   Cardio-vascular:  No  Orthopnea, PND, swelling in lower extremities,  dizziness, palpitations  GI:  No  abdominal pain, nausea, vomiting, diarrhea, loss of appetite, hematochezia, melena, heartburn, indigestion, Resp:  . No cough. No coughing up of blood .No wheezing.No chest wall deformity  Skin:  no rash or lesions.  GU:  no dysuria, change in color of urine, no urgency or frequency. No flank pain.  Musculoskeletal:  No joint pain or swelling. No decreased range of motion. No back pain.  Psych:  No change in mood or affect. No depression or anxiety. Neurologic: No headache, no dysesthesia, no focal weakness, no vision loss. No syncope  Physical Exam: Filed Vitals:   11/09/13 1208  BP: 151/96  Pulse: 79  Temp: 98.3 F (36.8 C)  TempSrc: Oral  Resp: 20  SpO2: 96%   General:  A&O x 2, NAD, nontoxic, pleasant/cooperative Head/Eye: No conjunctival hemorrhage, no icterus, [place]/AT, No nystagmus ENT:  No icterus,  No thrush,  no pharyngeal exudate Neck:  No masses, no lymphadenpathy, no meningismus CV:  IRRR, no rub, no gallop, no S3 Lung:  Bibasilar crackles. No  wheezing. Good air movement Abdomen: soft/NT, +BS, nondistended, no peritoneal signs Ext: Approximately 3 cm venous stasis ulcer right pretibial area with scant amount of yellow drainage. Bilateral lower extremity erythema below the knee without any crepitance, necrosis. 2+ edema, left greater than right leg.  Labs on Admission:  Basic Metabolic Panel:  Recent Labs Lab 11/09/13 1423  NA PENDING  K PENDING  CL PENDING  CO2 34*  GLUCOSE 117*  BUN 20  CREATININE 1.26*  CALCIUM 9.3   Liver Function Tests: No results found for this basename: AST, ALT, ALKPHOS, BILITOT, PROT, ALBUMIN,  in the last 168 hours No results found for this basename: LIPASE, AMYLASE,  in the last 168 hours No results found for this basename: AMMONIA,  in the last 168 hours CBC:  Recent Labs Lab 11/09/13 1423  WBC 7.9  NEUTROABS 6.4  HGB 10.2*  HCT 32.0*  MCV 95.8  PLT 240   Cardiac Enzymes: No results found for this basename: CKTOTAL, CKMB, CKMBINDEX, TROPONINI,  in the last 168 hours BNP: No components found with this basename: POCBNP,  CBG: No results found for this basename: GLUCAP,  in the last 168 hours  Radiological Exams on Admission: Dg Chest 1 View  11/09/2013   CLINICAL DATA:  Left hip fracture. Recent shortness of breath and coughing. A preop for hip surgery.  EXAM: CHEST - 1 VIEW  COMPARISON:  Two-view chest 09/06/2012. CT chest with contrast 03/16/2009.  FINDINGS: The heart is enlarged. Edematous changes are superimposed on chronic interstitial coarsening. A large hiatal hernia is present. Chronic prominence of the right hila is stable. Atherosclerotic calcifications are present at the aortic arch.  IMPRESSION: 1. New interstitial airspace disease representing edema superimposed on chronic change. 2. Stable cardiomegaly. 3. Stable large hiatal hernia.   Electronically Signed   By: Gennette Pac M.D.   On: 11/09/2013 14:53   Dg Hip Complete Left  11/09/2013   CLINICAL DATA:  Fall,  pain.  EXAM: LEFT HIP - COMPLETE 2+ VIEW  COMPARISON:  None.  FINDINGS: There is a periprosthetic proximal femur fracture along the medial and distal aspect of the left total hip replacement femoral component. Slight angulation anteriorly.  IMPRESSION: Left periprosthetic proximal femur fracture.   Electronically Signed   By: Davonna Belling M.D.   On: 11/09/2013 13:32    EKG: Independently reviewed. Afib, RBBB    Time spent:70 minutes Code Status:   FULL Family Communication:   Daughter at bedside   Josey Forcier, DO  [facility] Pager [phone]  If 7PM-7AM, please contact night-coverage [email or web address] Password [email or web address] 11/09/2013, 4:09 PM

## 2013-11-09 NOTE — Progress Notes (Signed)
Patient admitted at from ED with left hip fracture post fall at home. Alert and oriented with some memory impairment, follow command, positive CMS on left hip. Oriented patient/family to room/unit/hospital and reviewed plan of care with patient and family. Skin tears on bil wrist and wound on right lateral leg patient/family stated the car door hit an old wound, draining mild yellowish from the wound no odor. Will continue to F/U with plan of care.

## 2013-11-09 NOTE — Progress Notes (Signed)
ANTIBIOTIC CONSULT NOTE - INITIAL  Pharmacy Consult for Vancomycin Indication: cellulitis  Allergies  Allergen Reactions  . Levaquin [Levofloxacin] Anaphylaxis  . Penicillins Anaphylaxis  . Sulfonamide Derivatives Anaphylaxis    Patient Measurements:     Vital Signs: Temp: 98.4 F (36.9 C) (12/15 1619) Temp src: Oral (12/15 1619) BP: 142/79 mmHg (12/15 1619) Pulse Rate: 99 (12/15 1619)  Labs:  Recent Labs  11/09/13 1423  WBC 7.9  HGB 10.2*  PLT 240  CREATININE 1.26*   The CrCl is unknown because both a height and weight (above a minimum accepted value) are required for this calculation. No results found for this basename: VANCOTROUGH, VANCOPEAK, VANCORANDOM, GENTTROUGH, GENTPEAK, GENTRANDOM, TOBRATROUGH, TOBRAPEAK, TOBRARND, AMIKACINPEAK, AMIKACINTROU, AMIKACIN,  in the last 72 hours   Microbiology: No results found for this or any previous visit (from the past 720 hour(s)).  Medical History: Past Medical History  Diagnosis Date  . MDS (myelodysplastic syndrome) 10/05/2011  . Pneumonia   . Thyroid disease   . Hypertension   . Atrial fibrillation dx Mar. 2010     Assessment: 61 yoF admitted with femur fracture following fall at home today.  Patient has been on cephalexin for 5 days PTA for chronic right lower extremity ulceration concerning for infection.  Patient daughter reports that the erythema on patient's legs are worse than usual.  MD discontinuing cephalexin and starting vancomycin for treatment of cellulitis.  WBC WNL  Afebrile  SCr 1.26, CrCl~33 ml/min (normalized), ~30 ml/min (CG)  Goal of Therapy:  Vancomycin trough level 10-15 mcg/ml  Plan:  Vancomycin 750 mg IV q24h.  Clance Boll 11/09/2013,4:41 PM

## 2013-11-09 NOTE — ED Provider Notes (Addendum)
CSN: [id number]     Arrival date & time 11/09/13  1152 History   First MD Initiated Contact with Patient 11/09/13 1207     Chief Complaint  Patient presents with  . Fall   (Consider location/radiation/quality/duration/timing/severity/associated sxs/prior Treatment) Patient is a 79 y.o. female presenting with fall. The history is provided by the patient.  Fall   patient here from home after she fell onto her left hip. No loss of consciousness. She cannot ambulate. Denies any back pain. States that she tripped on the floor and slid between 2 pieces of structures in the bathroom. History of prior left hip surgery with replacement. Denies any recent illnesses. Called EMS and was brought here.  Past Medical History  Diagnosis Date  . MDS (myelodysplastic syndrome) 10/05/2011  . Pneumonia   . Thyroid disease   . Hypertension   . Atrial fibrillation    Past Surgical History  Procedure Laterality Date  . Orif femur fracture  2001    left   No family history on file. History  Substance Use Topics  . Smoking status: Never Smoker   . Smokeless tobacco: Never Used  . Alcohol Use: No   OB History   Grav Para Term Preterm Abortions TAB SAB Ect Mult Living                 Review of Systems  All other systems reviewed and are negative.    Allergies  Levaquin; Penicillins; and Sulfonamide derivatives  Home Medications   Current Outpatient Rx  Name  Route  Sig  Dispense  Refill  . aspirin 81 MG tablet   Oral   Take 81 mg by mouth daily.         . furosemide (LASIX) 40 MG tablet   Oral   Take 40 mg by mouth daily.         Marland Kitchen levothyroxine (SYNTHROID, LEVOTHROID) 125 MCG tablet   Oral   Take 125 mcg by mouth daily.         Marland Kitchen lisinopril (PRINIVIL,ZESTRIL) 10 MG tablet   Oral   Take 10 mg by mouth daily.         . metoprolol succinate (TOPROL-XL) 100 MG 24 hr tablet   Oral   Take 100 mg by mouth daily.           BP 151/96  Pulse 79  Temp(Src) 98.3 F (36.8  C) (Oral)  Resp 20  SpO2 96% Physical Exam  Nursing note and vitals reviewed. Constitutional: She is oriented to person, place, and time. She appears well-developed and well-nourished.  Non-toxic appearance. No distress.  HENT:  Head: Normocephalic and atraumatic.  Eyes: Conjunctivae, EOM and lids are normal. Pupils are equal, round, and reactive to light.  Neck: Normal range of motion. Neck supple. No tracheal deviation present. No mass present.  Cardiovascular: Normal rate, regular rhythm and normal heart sounds.  Exam reveals no gallop.   No murmur heard. Pulmonary/Chest: Effort normal and breath sounds normal. No stridor. No respiratory distress. She has no decreased breath sounds. She has no wheezes. She has no rhonchi. She has no rales.  Abdominal: Soft. Normal appearance and bowel sounds are normal. She exhibits no distension. There is no tenderness. There is no rebound and no CVA tenderness.  Musculoskeletal: Normal range of motion. She exhibits no edema and no tenderness.  Left hip shortened and rotated. Neurovascular intact  Neurological: She is alert and oriented to person, place, and time. She has normal strength.  No cranial nerve deficit or sensory deficit. GCS eye subscore is 4. GCS verbal subscore is 5. GCS motor subscore is 6.  Skin: Skin is warm and dry. No abrasion and no rash noted.  Psychiatric: She has a normal mood and affect. Her speech is normal and behavior is normal.    ED Course  Procedures (including critical care time) Labs Review Labs Reviewed  CBC WITH DIFFERENTIAL - Abnormal; Notable for the following:    RBC 3.34 (*)    Hemoglobin 10.2 (*)    HCT 32.0 (*)    RDW 18.0 (*)    Neutrophils Relative % 81 (*)    Lymphocytes Relative 7 (*)    Lymphs Abs 0.5 (*)    All other components within normal limits  BASIC METABOLIC PANEL - Abnormal; Notable for the following:    CO2 34 (*)    Glucose, Bld 117 (*)    Creatinine, Ser 1.26 (*)    GFR calc non Af  Amer 36 (*)    GFR calc Af Amer 42 (*)    All other components within normal limits   Imaging Review Dg Chest 1 View  11/09/2013   CLINICAL DATA:  Left hip fracture. Recent shortness of breath and coughing. A preop for hip surgery.  EXAM: CHEST - 1 VIEW  COMPARISON:  Two-view chest 09/06/2012. CT chest with contrast 03/16/2009.  FINDINGS: The heart is enlarged. Edematous changes are superimposed on chronic interstitial coarsening. A large hiatal hernia is present. Chronic prominence of the right hila is stable. Atherosclerotic calcifications are present at the aortic arch.  IMPRESSION: 1. New interstitial airspace disease representing edema superimposed on chronic change. 2. Stable cardiomegaly. 3. Stable large hiatal hernia.   Electronically Signed   By: Gennette Pac M.D.   On: 11/09/2013 14:53   Dg Hip Complete Left  11/09/2013   CLINICAL DATA:  Fall, pain.  EXAM: LEFT HIP - COMPLETE 2+ VIEW  COMPARISON:  None.  FINDINGS: There is a periprosthetic proximal femur fracture along the medial and distal aspect of the left total hip replacement femoral component. Slight angulation anteriorly.  IMPRESSION: Left periprosthetic proximal femur fracture.   Electronically Signed   By: Davonna Belling M.D.   On: 11/09/2013 13:32    EKG Interpretation    Date/Time:  Monday November 09 2013 13:50:53 EST Ventricular Rate:  87 PR Interval:    QRS Duration: 158 QT Interval:  429 QTC Calculation: 516 R Axis:   116 Text Interpretation:  Atrial fibrillation Right bundle branch block Lateral infarct, age indeterminate Baseline wander in lead(s) V5 Confirmed by Kalya Troeger  MD, Cheick Suhr ([id number]) on 11/09/2013 3:27:23 PM            MDM  No diagnosis found. Patient with left proximal femur fracture. Spoke with her orthopedist and patient to be admitted [facility]    Toy Baker, MD 11/09/13 1524  Toy Baker, MD 11/09/13 [phone]

## 2013-11-09 NOTE — ED Notes (Signed)
Bed: WA17 Expected date: 11/09/13 Expected time: 11:49 AM Means of arrival:  Comments: Ems -89's female fall

## 2013-11-09 NOTE — ED Notes (Addendum)
Per EMS: pt from home, had a fall this morning in bathroom unwitnessed, daughter found her. No blood thinner. Two skin tears on top of right and left wrist, large hematoma to left forearm and left hand. C/o left hip pain.

## 2013-11-09 NOTE — Consult Note (Signed)
ORTHOPAEDIC CONSULTATION  REQUESTING PHYSICIAN: Catarina Hartshorn, MD  Chief Complaint: Left hip/thigh pain  HPI: Gina Wilkinson is a 79 y.o. female who complains of left hip/thigh pain s/p mechanical fall at home today off toilet.  Denies LOC, neck pain, abd pain.  Underwent hemiarthroplasty by Dr. Cleophas Dunker in 2001.  Denies any thigh or hip pain prior to fall.  Denies any postop complications.   Past Medical History  Diagnosis Date  . MDS (myelodysplastic syndrome) 10/05/2011  . Pneumonia   . Thyroid disease   . Hypertension   . Atrial fibrillation dx Mar. 2010   Past Surgical History  Procedure Laterality Date  . Orif femur fracture  2001    left  . Appendectomy     History   Social History  . Marital Status: Widowed    Spouse Name: N/A    Number of Children: N/A  . Years of Education: N/A   Social History Main Topics  . Smoking status: Never Smoker   . Smokeless tobacco: Never Used  . Alcohol Use: No  . Drug Use: No  . Sexual Activity: None   Other Topics Concern  . None   Social History Narrative  . None   History reviewed. No pertinent family history. Allergies  Allergen Reactions  . Levaquin [Levofloxacin] Anaphylaxis  . Penicillins Anaphylaxis  . Sulfonamide Derivatives Anaphylaxis   Prior to Admission medications   Medication Sig Start Date End Date Taking? Authorizing Provider  aspirin 81 MG tablet Take 81 mg by mouth daily.   Yes Historical Provider, MD  furosemide (LASIX) 40 MG tablet Take 40 mg by mouth daily.   Yes Historical Provider, MD  levothyroxine (SYNTHROID, LEVOTHROID) 125 MCG tablet Take 125 mcg by mouth daily.   Yes Historical Provider, MD  lisinopril (PRINIVIL,ZESTRIL) 10 MG tablet Take 10 mg by mouth daily.   Yes Historical Provider, MD  metoprolol succinate (TOPROL-XL) 100 MG 24 hr tablet Take 100 mg by mouth daily.  07/12/12  Yes Historical Provider, MD   Dg Chest 1 View  11/09/2013   CLINICAL DATA:  Left hip fracture. Recent  shortness of breath and coughing. A preop for hip surgery.  EXAM: CHEST - 1 VIEW  COMPARISON:  Two-view chest 09/06/2012. CT chest with contrast 03/16/2009.  FINDINGS: The heart is enlarged. Edematous changes are superimposed on chronic interstitial coarsening. A large hiatal hernia is present. Chronic prominence of the right hila is stable. Atherosclerotic calcifications are present at the aortic arch.  IMPRESSION: 1. New interstitial airspace disease representing edema superimposed on chronic change. 2. Stable cardiomegaly. 3. Stable large hiatal hernia.   Electronically Signed   By: Gennette Pac M.D.   On: 11/09/2013 14:53   Dg Hip Complete Left  11/09/2013   CLINICAL DATA:  Fall, pain.  EXAM: LEFT HIP - COMPLETE 2+ VIEW  COMPARISON:  None.  FINDINGS: There is a periprosthetic proximal femur fracture along the medial and distal aspect of the left total hip replacement femoral component. Slight angulation anteriorly.  IMPRESSION: Left periprosthetic proximal femur fracture.   Electronically Signed   By: Davonna Belling M.D.   On: 11/09/2013 13:32    Positive ROS: All other systems have been reviewed and were otherwise negative with the exception of those mentioned in the HPI and as above.  Physical Exam: General: Alert, no acute distress Cardiovascular: 2+ pedal edema Respiratory: No cyanosis, no use of accessory musculature GI: No organomegaly, abdomen is soft and non-tender Skin: No lesions in the  area of chief complaint Neurologic: Sensation intact distally Psychiatric: Patient is competent for consent with normal mood and affect Lymphatic: No axillary or cervical lymphadenopathy  MUSCULOSKELETAL:  LLE: - NVI - WWP - 2+ pedal edema - skin intact  Assessment: 1. Left hip periprosthetic fracture, [place] B1  Plan: - will need operative treatment - patient currently in decompensated CHF and will delay surgery until medically stable and optimized as determined by hospitalist - r/b/a  to surgery discussed with patient and daughter and both agree to proceed when able - will follow along  - call with questions  Thank you for the consult and the opportunity to see Gina Wilkinson. Glee Arvin, MD [facility] [phone] 5:42 PM

## 2013-11-10 DIAGNOSIS — I5031 Acute diastolic (congestive) heart failure: Secondary | ICD-10-CM | POA: Diagnosis not present

## 2013-11-10 DIAGNOSIS — I509 Heart failure, unspecified: Secondary | ICD-10-CM | POA: Diagnosis not present

## 2013-11-10 DIAGNOSIS — Z01810 Encounter for preprocedural cardiovascular examination: Secondary | ICD-10-CM

## 2013-11-10 DIAGNOSIS — IMO0002 Reserved for concepts with insufficient information to code with codable children: Secondary | ICD-10-CM

## 2013-11-10 DIAGNOSIS — I4891 Unspecified atrial fibrillation: Secondary | ICD-10-CM | POA: Diagnosis not present

## 2013-11-10 DIAGNOSIS — L02419 Cutaneous abscess of limb, unspecified: Secondary | ICD-10-CM | POA: Diagnosis not present

## 2013-11-10 LAB — BASIC METABOLIC PANEL
CO2: 35 mEq/L — ABNORMAL HIGH (ref 19–32)
Chloride: 94 mEq/L — ABNORMAL LOW (ref 96–112)
Creatinine, Ser: 1.43 mg/dL — ABNORMAL HIGH (ref 0.50–1.10)
GFR calc Af Amer: 36 mL/min — ABNORMAL LOW (ref >90)
Potassium: 3.9 mEq/L (ref 3.5–5.1)
Sodium: 136 mEq/L (ref 135–145)

## 2013-11-10 LAB — TROPONIN I: Troponin I: <0.3 ng/mL (ref <0.30)

## 2013-11-10 MED ORDER — EPHEDRINE SULFATE 50 MG/ML IJ SOLN
INTRAMUSCULAR | Status: AC
  Filled 2013-11-10: qty 1

## 2013-11-10 MED ORDER — VANCOMYCIN HCL IN DEXTROSE 1-5 GM/200ML-% IV SOLN
1000.0000 mg | INTRAVENOUS | Status: DC
  Administered 2013-11-11 – 2013-11-15 (×3): 1000 mg via INTRAVENOUS
  Filled 2013-11-10 (×3): qty 200

## 2013-11-10 MED ORDER — FUROSEMIDE 10 MG/ML IJ SOLN
40.0000 mg | Freq: Two times a day (BID) | INTRAMUSCULAR | Status: DC
  Administered 2013-11-10: 40 mg via INTRAVENOUS
  Filled 2013-11-10 (×3): qty 4

## 2013-11-10 NOTE — Progress Notes (Signed)
[facility] PROGRESS NOTE  Gina Wilkinson ZOX:[id number] DOB: Mar 26, 1920 DOA: 11/09/2013 PCP: Pcp Not In System  Assessment/Plan: Acute diastolic CHF  -patient remains clinically fluid overloaded  -My manual BP  96/52 this morning -Echo on 11/05/13 showed EF 60-65% with mod-severe pulm HTN  -suspect R-sided heart failure, now with drop in BP with diuresis-->no enough preload to generate afterload? -hold furosemide for now -consult cardiology -d/c metoprolol succinate for now due to marginally low BP -Daily weights-->neg 0.9kg  -Fluid restriction  -proBNP--5281  -Cycle troponins--neg -Place Foley catheter due to the patient's proximal femur fracture, immobility in the setting of aggressive IV diuresis  Chronic atrial fibrillation  -Patient is on aspirin  -Not a warfarin candidate due to myelodysplastic syndrome  -d/c metoprolol succinate  -She is rate controlled  -may need to add digoxin -TSH Cellulitis--legs  -Although a little difficult to discern in the setting of venous stasis dermatitis,  Patient's daughter states that the erythema in her legs are worse than usual  -Discontinue cephalexin patient was taking as an outpatient  -Start vancomycin---> slight improvement in erythema compared to 11/09/2013 -Consult wound care for right pretibial venous stasis ulcer  Right proximal femur fracture  -I spoke with orthopedics--patient is currently not stable for surgery due to decompensated CHF  -consulted cardiology for cardiac clearance -Dr. Cleophas Dunker has been consulted  Myelodysplastic syndrome  -Patient usually follows Dr. Cyndie Chime  -getting intermitten Aranesp  RBBB  -chronic from previous EKGs  Family Communication:   daughter at beside updated Disposition Plan:   Home when medically stable   Antibiotics:  Vancomycin--11/09/13>>>         Procedures/Studies: Dg Chest 1 View  11/09/2013   CLINICAL DATA:  Left hip fracture. Recent shortness of breath  and coughing. A preop for hip surgery.  EXAM: CHEST - 1 VIEW  COMPARISON:  Two-view chest 09/06/2012. CT chest with contrast 03/16/2009.  FINDINGS: The heart is enlarged. Edematous changes are superimposed on chronic interstitial coarsening. A large hiatal hernia is present. Chronic prominence of the right hila is stable. Atherosclerotic calcifications are present at the aortic arch.  IMPRESSION: 1. New interstitial airspace disease representing edema superimposed on chronic change. 2. Stable cardiomegaly. 3. Stable large hiatal hernia.   Electronically Signed   By: Gennette Pac M.D.   On: 11/09/2013 14:53   Dg Hip Complete Left  11/09/2013   CLINICAL DATA:  Fall, pain.  EXAM: LEFT HIP - COMPLETE 2+ VIEW  COMPARISON:  None.  FINDINGS: There is a periprosthetic proximal femur fracture along the medial and distal aspect of the left total hip replacement femoral component. Slight angulation anteriorly.  IMPRESSION: Left periprosthetic proximal femur fracture.   Electronically Signed   By: Davonna Belling M.D.   On: 11/09/2013 13:32         Subjective: Patient denies fevers, chills, chest discomfort, shortness breath, nausea, vomiting, diarrhea, abdominal pain. Leg pain is controlled. No dysuria or hematuria.  Objective: Filed Vitals:   11/10/13 0157 11/10/13 0538 11/10/13 0605 11/10/13 0900  BP: 101/43 124/57  90/40  Pulse: 91 78  86  Temp: 98.5 F (36.9 C) 98.3 F (36.8 C)  98.3 F (36.8 C)  TempSrc: Oral Oral  Oral  Resp: 20 22  20   Height:      Weight:   71.26 kg (157 lb 1.6 oz)   SpO2: 96% 90%  93%    Intake/Output Summary (Last 24 hours) at 11/10/13 1012 Last data filed at 11/10/13 0552  Gross per  24 hour  Intake    250 ml  Output    801 ml  Net   -551 ml   Weight change:  Exam:   General:  Pt is alert, follows commands appropriately, not in acute distress  HEENT: No icterus, No thrush, No neck mass, [place]/AT  Cardiovascular: IRRR, S1/S2, no rubs, no  gallops  Respiratory: Bibasilar crackles. Left greater than right. No wheezing.  Abdomen: Soft/+BS, non tender, non distended, no guarding Extremities: Approximately 3 cm venous stasis ulcer right pretibial area with scant amount of yellow drainage. Bilateral lower extremity erythema below the knee without any crepitance, necrosis. 2+ edema, left greater than right leg.    Data Reviewed: Basic Metabolic Panel:  Recent Labs Lab 11/09/13 1423 11/10/13 0407  NA 137 136  K 3.8 3.9  CL 95* 94*  CO2 34* 35*  GLUCOSE 117* 100*  BUN 20 22  CREATININE 1.26* 1.43*  CALCIUM 9.3 8.7   Liver Function Tests: No results found for this basename: AST, ALT, ALKPHOS, BILITOT, PROT, ALBUMIN,  in the last 168 hours No results found for this basename: LIPASE, AMYLASE,  in the last 168 hours No results found for this basename: AMMONIA,  in the last 168 hours CBC:  Recent Labs Lab 11/09/13 1423 11/09/13 1722  WBC 7.9 8.6  NEUTROABS 6.4 7.1  HGB 10.2* 10.2*  HCT 32.0* 31.6*  MCV 95.8 95.2  PLT 240 224   Cardiac Enzymes:  Recent Labs Lab 11/09/13 1722 11/09/13 2258 11/10/13 0407  TROPONINI <0.30 <0.30 <0.30   BNP: No components found with this basename: POCBNP,  CBG: No results found for this basename: GLUCAP,  in the last 168 hours  No results found for this or any previous visit (from the past 240 hour(s)).   Scheduled Meds: . aspirin  81 mg Oral Daily  . heparin  5,000 Units Subcutaneous Q8H  . levothyroxine  125 mcg Oral QAC breakfast  . sodium chloride  3 mL Intravenous Q12H  . vancomycin  750 mg Intravenous Q24H   Continuous Infusions:    Rebecca Motta, DO  [facility] Pager [phone]  If 7PM-7AM, please contact night-coverage [email or web address] Password [email or web address] 11/10/2013, 10:12 AM   LOS: 1 day

## 2013-11-10 NOTE — Consult Note (Signed)
CARDIOLOGY CONSULT NOTE   Patient ID: Gina Wilkinson MRN: [id number] DOB/AGE: May 11, 1920 79 y.o.  Admit date: 11/09/2013  Primary Physician   Pcp Not In System Primary Cardiologist   Dr at [facility] in [place], seen once by Dr. Ladona Ridgel in 2010 Reason for Consultation   Pre op eval   HPI: Gina Wilkinson is a 79 y.o. female with a history of myelodysplastic syndrome, HTN, diastolic CHF (EF 40-98% on ECHO 11/05/2013), permanent atrial fibrillation- rate controlled with BB and not on anticoagulation 2/2 MDS and no other prior cardiac history. She presented to the ED yesterday after a mechanical fall causing a left periprosthetic proximal femur fracture. Additionally, the patient had been experiencing worsening SOB and LE edema x 6 days. She went to her primary care for significant DOE and was found to be grossly volume overloaded. She was placed on furosemide 40 mg daily and there has been modest improvements in dyspnea and lower extremity edema. Echocardiogram from 11/05/13 showed EF of 60-65%, mild to moderate aortic stenosis, and moderate to severe pulmonary hypertension. She denies chest pain, palpitations, lightheadedness, dizziness, orthopnea, PND and cannot say if dyspnea is improved as she has not been ambulatory.  Orthopedics was called and will not proceed with surgery until patient is cleared by cardiology. Cardiac enzymes negative x3, Pro-BNP 5281. Cr. 1.43 (baseline ~1)    Past Medical History  Diagnosis Date  . MDS (myelodysplastic syndrome) 10/05/2011  . Pneumonia   . Thyroid disease   . Hypertension   . Atrial fibrillation dx Mar. 2010     Past Surgical History  Procedure Laterality Date  . Orif femur fracture  2001    left  . Appendectomy      Allergies  Allergen Reactions  . Levaquin [Levofloxacin] Anaphylaxis  . Penicillins Anaphylaxis  . Sulfonamide Derivatives Anaphylaxis    I have reviewed the patient's current medications . aspirin  81 mg  Oral Daily  . heparin  5,000 Units Subcutaneous Q8H  . levothyroxine  125 mcg Oral QAC breakfast  . sodium chloride  3 mL Intravenous Q12H  . [START ON 11/11/2013] vancomycin  1,000 mg Intravenous Q48H     sodium chloride, acetaminophen, HYDROcodone-acetaminophen, morphine injection, ondansetron (ZOFRAN) IV, sodium chloride  Prior to Admission medications   Medication Sig Start Date End Date Taking? Authorizing Provider  aspirin 81 MG tablet Take 81 mg by mouth daily.   Yes Historical Provider, MD  furosemide (LASIX) 40 MG tablet Take 40 mg by mouth daily.   Yes Historical Provider, MD  levothyroxine (SYNTHROID, LEVOTHROID) 125 MCG tablet Take 125 mcg by mouth daily.   Yes Historical Provider, MD  lisinopril (PRINIVIL,ZESTRIL) 10 MG tablet Take 10 mg by mouth daily.   Yes Historical Provider, MD  metoprolol succinate (TOPROL-XL) 100 MG 24 hr tablet Take 100 mg by mouth daily.  07/12/12  Yes Historical Provider, MD     History   Social History  . Marital Status: Widowed    Spouse Name: N/A    Number of Children: N/A  . Years of Education: N/A   Occupational History  . Not on file.   Social History Main Topics  . Smoking status: Never Smoker   . Smokeless tobacco: Never Used  . Alcohol Use: No  . Drug Use: No  . Sexual Activity: Not on file   Other Topics Concern  . Not on file   Social History Narrative  . No narrative on file  No family status information on file.   History reviewed. No pertinent family history.   ROS:  Full 14 point review of systems complete and found to be negative unless listed above.  Physical Exam: Blood pressure 94/55, pulse 90, temperature 98 F (36.7 C), temperature source Oral, resp. rate 20, height 5\' 6"  (1.676 m), weight 157 lb 1.6 oz (71.26 kg), SpO2 95.00%.  General: Pt is alert, follows commands appropriately, not in acute distress  HEENT: No icterus, No thrush, No neck mass, [place]/AT  Cardiovascular: IRRR, S1/S2, no rubs, no gallops   +JVD (9-10 cm) Respiratory: Bibasilar crackles. Left greater than right. No wheezing.  Abdomen: Soft/+BS, non tender, non distended, no guarding Extremities: Approximately 3 cm venous stasis ulcer right pretibial area with scant amount of yellow drainage. Bilateral lower extremity erythema below the knee without any crepitance, necrosis. 2+ edema, left greater than right leg.  Labs:   Lab Results  Component Value Date   WBC 8.6 11/09/2013   HGB 10.2* 11/09/2013   HCT 31.6* 11/09/2013   MCV 95.2 11/09/2013   PLT 224 11/09/2013      Recent Labs Lab 11/10/13 0407  NA 136  K 3.9  CL 94*  CO2 35*  BUN 22  CREATININE 1.43*  CALCIUM 8.7  GLUCOSE 100*    Recent Labs  11/09/13 1722 11/09/13 2258 11/10/13 0407  TROPONINI <0.30 <0.30 <0.30   No results found for this basename: TROPIPOC,  in the last 72 hours Pro B Natriuretic peptide (BNP)  Date/Time Value Range Status  11/09/2013  5:21 PM 5281.0* 0 - 450 pg/mL Final  01/27/2009  7:22 PM 331.0* 0.0 - 100.0 pg/mL Final     Echo: Transthoracic Echocardiography Patient: Gina, Wilkinson Date: 11/05/2013 ------------------------------------------------------------ LV EF: 60% - 65% ------------------------------------------------------------ Indications: CHF - 428.0. ------------------------------------------------------------ History: PMH: Atrial fibrillation. Risk factors: Hypothyroidism. Myelodysplastic syndrome. ----------------------------------------------------------- Study Conclusions - Left ventricle: The cavity size was normal. There was moderate concentric hypertrophy. Systolic function was normal. The estimated ejection fraction was in the range of 60% to 65%. Wall motion was normal; there were no regional wall motion abnormalities. - Ventricular septum: Septal motion showed abnormal function and dyssynergy. - Aortic valve: Trileaflet; mildly thickened, moderately calcified leaflets. Transvalvular  velocity was increased. There was mild to moderate stenosis. Valve area: 1cm^2(VTI). Valve area: 1.05cm^2 (Vmax). - Mitral valve: Moderately fibrotic annulus. Normal thickness, moderately calcified leaflets . Mild regurgitation. - Left atrium: The atrium was severely dilated. - Right ventricle: The cavity size was mildly dilated. Wall thickness was normal. - Right atrium: The atrium was severely dilated. - Tricuspid valve: Moderate regurgitation. - Pulmonary arteries: Systolic pressure was moderately to severely increased. PA peak pressure: 73mm Hg (S). - Pericardium, extracardiac: A trivial pericardial effusion was identified. ------------------------------------------------------------ ------------------------------------------------------------ Left ventricle: The cavity size was normal. There was moderate concentric hypertrophy. Systolic function was normal. The estimated ejection fraction was in the range of 60% to 65%. Wall motion was normal; there were no regional wall motion abnormalities. ------------------------------------------------------------ Aortic valve: Trileaflet; mildly thickened, moderately calcified leaflets. Mobility was not restricted. Doppler: Transvalvular velocity was increased. There was mild to moderate stenosis. No regurgitation. Valve area: 1cm^2(VTI). Indexed valve area: 0.57cm^2/m^2 (VTI). Peak velocity ratio of LVOT to aortic valve: 0.33. Valve area: 1.05cm^2 (Vmax). Indexed valve area: 0.6cm^2/m^2 (Vmax). Mean gradient: 15mm Hg (S). Peak gradient: 30mm Hg (S). ------------------------------------------------------------ Aorta: Aortic root: The aortic root was normal in size. ------------------------------------------------------------ Mitral valve: Moderately fibrotic annulus. Normal thickness, moderately calcified leaflets .  Mobility was not restricted. Doppler: Transvalvular velocity was within the normal range. There was no evidence for  stenosis. Mild regurgitation. ------------------------------------------------------------ Left atrium: The atrium was severely dilated. ------------------------------------------------------------ Right ventricle: The cavity size was mildly dilated. Wall thickness was normal. Systolic function was normal. ------------------------------------------------------------ Ventricular septum: Septal motion showed abnormal function and dyssynergy. ------------------------------------------------------------ Pulmonic valve: Not well visualized. The valve appears to be grossly normal. Doppler: Transvalvular velocity was within the normal range. There was no evidence for stenosis. ----------------------------------------------------------- Tricuspid valve: Structurally normal valve. Doppler: Transvalvular velocity was within the normal range. Moderate regurgitation. ------------------------------------------------------------ Pulmonary artery: The main pulmonary artery was normal-sized. Systolic pressure was moderately to severely increased. ------------------------------------------------------------ Right atrium: The atrium was severely dilated. he--in ---and per as a simple and single  ------------------------------------------------------- Pericardium: A trivial pericardial effusion was identified. ------------------------------------------------------------ Systemic veins: Inferior vena cava: The vessel was dilated.   TELE: Afib, RBBB, HR in the 80s, frequent PVCs  ECG: 10/1513  HR 87 AFIB RBBB  Radiology:  Dg Chest 1 View  11/09/2013   CLINICAL DATA:  Left hip fracture. Recent shortness of breath and coughing. A preop for hip surgery.  EXAM: CHEST - 1 VIEW  COMPARISON:  Two-view chest 09/06/2012. CT chest with contrast 03/16/2009.  FINDINGS: The heart is enlarged. Edematous changes are superimposed on chronic interstitial coarsening. A large hiatal hernia is present. Chronic  prominence of the right hila is stable. Atherosclerotic calcifications are present at the aortic arch.  IMPRESSION: 1. New interstitial airspace disease representing edema superimposed on chronic change. 2. Stable cardiomegaly. 3. Stable large hiatal hernia.    Dg Hip Complete Left  11/09/2013   CLINICAL DATA:  Fall, pain.  EXAM: LEFT HIP - COMPLETE 2+ VIEW  COMPARISON:  None.  FINDINGS: There is a periprosthetic proximal femur fracture along the medial and distal aspect of the left total hip replacement femoral component. Slight angulation anteriorly.  IMPRESSION: Left periprosthetic proximal femur fracture.   Electronically Signed   By: Davonna Belling M.D.   On: 11/09/2013 13:32    ASSESSMENT AND PLAN:    Active Problems:   HYPOTHYROIDISM   MDS (myelodysplastic syndrome)   Hip fracture   Acute diastolic CHF (congestive heart failure)   Chronic atrial fibrillation   RBBB   Cellulitis and abscess of leg  Acute diastolic CHF - new diagnosis from 1 week ago, Echo on 11/05/13 showed EF 60-65% with mod-severe pulm HTN. CXR with new interstitial airspace disease representing edema superimposed on chronic change and cardiomegaly. -- Patient remains clinically fluid overloaded -- Manual BP 96/52 this morning, drops with diuresis, furosemide on hold  -- Metoprolol succinate discontinued 2/2 low BP  -- Neg 551 mL, weight down 1 LB from yesterday -- proBNP--5281, troponin x3--neg, Cr. 1.43 (baseline ~1) -- Foley catheter in place 2/2 proximal femur fracture, immobility in the setting of aggressive IV diuresis   Persistent atrial fibrillation  --Patient is on aspirin, not a warfarin candidate due to falls (and MDS).  --Metoprolol succinate discontinued 2/2 low blood pressure --TSH pending  Cellulitis--legs  -- Being treated with vancomycin per IM   Left proximal femur fracture  -- Complicated by patient's acute diastolic heart failure, pending cardiology clearance.   Myelodysplastic syndrome    --Patient usually follows Dr. Cyndie Chime  -- Getting intermittent Aranesp per oncology   Signed: Thereasa Parkin, PA-C 11/10/2013 4:11 PM  Co-Sign MD  Patient seen with PA, agree with the above note.    She has history of MDS and permanent  atrial fibrillation.  She had mechanical fall resulting in left proximal femur fracture and needs surgery.  She is volume overloaded on exam with normal EF and moderate-severe pulmonary HTN on echo.   1. Acute diastolic CHF: Preserved EF on echo but moderate to severe pulmonary HTN.  She is volume overloaded on exam with elevated JVP.  CXR with pulmonary edema. SBP in 90s so has not been getting Lasix.  - Start Lasix 40 mg IV bid with strict I/Os.  Hold only for SBP < 90.   2. Chronic atrial fibrillation: She is off metoprolol due to low blood pressure.  HR is stable for now.  Not anticoagulated due to fall risk and h/o MDS.  3. Preoperative evaluation: Risk would be high for surgery in the setting of acute CHF. Would like to diurese prior to surgery. Will reassess as she diureses.    Marca Ancona 11/10/2013 4:26 PM

## 2013-11-10 NOTE — Progress Notes (Signed)
Patient with low urine output, notified Dr. Arbutus Leas, no new order given. Will continue to assess patient.

## 2013-11-10 NOTE — Progress Notes (Signed)
CARE MANAGEMENT NOTE 11/10/2013  Patient:  Gina Wilkinson, Gina Wilkinson   Account Number:  [id number]  Date Initiated:  11/10/2013  Documentation initiated by:  Tykeshia Tourangeau  Subjective/Objective Assessment:   pt sustained fx hip s/p fall.  chf-being stablized for surg due to hypotension     Action/Plan:   surgical intervention when chf stablized   Anticipated DC Date:  11/13/2013   Anticipated DC Plan:  SKILLED NURSING FACILITY  In-house referral  Clinical Social Worker      DC Planning Services  NA      [facility] Choice  NA   Choice offered to / List presented to:  NA   DME arranged  NA      DME agency  NA     HH arranged  NA      HH agency  NA   Status of service:  In process, will continue to follow Medicare Important Message given?  NA - LOS <3 / Initial given by admissions (If response is "NO", the following Medicare IM given date fields will be blank) Date Medicare IM given:   Date Additional Medicare IM given:    Discharge Disposition:    Per UR Regulation:  Reviewed for med. necessity/level of care/duration of stay  If discussed at Long Length of Stay Meetings, dates discussed:    Comments:  Bjorn Loser Shadow Schedler,RN,BSN,CCM

## 2013-11-10 NOTE — Progress Notes (Signed)
ANTIBIOTIC CONSULT NOTE   Pharmacy Consult for Vancomycin Indication: cellulitis  Allergies  Allergen Reactions  . Levaquin [Levofloxacin] Anaphylaxis  . Penicillins Anaphylaxis  . Sulfonamide Derivatives Anaphylaxis    Patient Measurements: Height: 5\' 6"  (167.6 cm) Weight: 157 lb 1.6 oz (71.26 kg) IBW/kg (Calculated) : 59.3   Vital Signs: Temp: 98.3 F (36.8 C) (12/16 0900) Temp src: Oral (12/16 0900) BP: 90/40 mmHg (12/16 0900) Pulse Rate: 86 (12/16 0900)  Labs:  Recent Labs  11/09/13 1423 11/09/13 1722 11/10/13 0407  WBC 7.9 8.6  --   HGB 10.2* 10.2*  --   PLT 240 224  --   CREATININE 1.26*  --  1.43*   Estimated Creatinine Clearance: 25.9 ml/min (by C-G formula based on Cr of 1.43). No results found for this basename: VANCOTROUGH, VANCOPEAK, VANCORANDOM, GENTTROUGH, GENTPEAK, GENTRANDOM, TOBRATROUGH, TOBRAPEAK, TOBRARND, AMIKACINPEAK, AMIKACINTROU, AMIKACIN,  in the last 72 hours   Microbiology: No results found for this or any previous visit (from the past 720 hour(s)).  Medical History: Past Medical History  Diagnosis Date  . MDS (myelodysplastic syndrome) 10/05/2011  . Pneumonia   . Thyroid disease   . Hypertension   . Atrial fibrillation dx Mar. 2010     Assessment: 80 yoF admitted with femur fracture following fall at home today.  Patient has been on cephalexin for 5 days PTA for chronic right lower extremity ulceration concerning for infection.  Patient daughter reports that the erythema on patient's legs are worse than usual.  MD discontinuing cephalexin and starting vancomycin for treatment of cellulitis.  12/15 >> Vanc >>  Tmax: AF WBC: WNL Renal: SCr 1.43, CrCl~57ml/min (N), ~52ml/min (C-G)  12/15 blood cx: ordered  Goal of Therapy:  Vancomycin trough level 10-15 mcg/ml  Plan:   Due to rise in SCr, change vancomycin to 1gm IV q48h (for est predicted pk = 28, trough = 9)  Monitor renal function and check trough if remains on  vancomycin > 4-5 days  Juliette Alcide, PharmD, BCPS.   Pager: [phone]  11/10/2013,11:21 AM

## 2013-11-10 NOTE — Treatment Plan (Signed)
Patient is still undergoing work up and medical optimization.  Surgery will not be today.  Will continue to follow.  Mayra Reel, MD [facility] [phone] 2:06 PM

## 2013-11-10 NOTE — Progress Notes (Signed)
Patient blood pressure 90/40, HR-86 manually, metoprolol and lasix due to be given, notified Dr. Arbutus Leas notified and stated not to give metoprolol, on the way to see patient. Will continue to assess patient.

## 2013-11-10 NOTE — Consult Note (Signed)
WOC wound consult note Reason for Consult:Traumatic injury to RLE (hit LE on car door approximately 1 month ago) Wound type:traumatic Pressure Ulcer POA: No Measurement:area of involvement measures 5x5cm with 75% a pink granulating area of 0.2cm depth and 25% presenting as a deeper area measuring 1.5cm x 1cm x 0.5cm.  Some easily movable yellow slough in this portion of the wound.  Wound bed:As described above Drainage (amount, consistency, odor) Small amount of light yellow drainage on old dressing. Periwound: intact, red, warm to the touch. Dressing procedure/placement/frequency: As patient has an allergy to sulfonamide derivatives, I will not use a silver dressing.  Instead, I will offer a calcium alginate dressing and continue to top with the soft silicone foam dressing.  This will be changed every other day. This patient could be considered for [facility] support and monitoring of this wound with M-W-F dressing changes initially with gradual tapering down of frequency to twice weekly.  If you agree with [facility], please order. WOC nursing team will not follow, but will remain available to this patient, the nursing and medical team.  Please re-consult if needed. Thanks, Ladona Mow, MSN, RN, GNP, [place], CWON-AP [phone])

## 2013-11-10 NOTE — Progress Notes (Signed)
INITIAL NUTRITION ASSESSMENT  DOCUMENTATION CODES Per approved criteria  -Not Applicable   INTERVENTION: Recommend liberalizing diet to Low Sodium to allow pt more food choices Provide Magic Cup once daily Encourage PO intake  NUTRITION DIAGNOSIS: Predicted suboptimal energy intake related to hospitalization planned surgery as evidenced by pt's chart and family report that pt is eating less than usual.   Goal: Pt to meet >/= 90% of their estimated nutrition needs   Monitor:  PO intake Weight Labs  Reason for Assessment: Malnutrition Screening Tool, score of 2  79 y.o. female  Admitting Dx: Hip Fracture  ASSESSMENT: 79 year old female presented after a mechanical fall at her home 12/15. There was no syncope. The patient has mild cognitive impairment. X-ray of the left hip in ED showed a left periprosthetic proximal femur fracture. Further history reveals that the patient has had increasing lower extremity edema and shortness of breath for approximately 6 days prior to this admission. Pt states that she always eats 3 meals daily but, never eats much. She states her appetite is fair and she is eating normally. Per nursing notes pt ate 30-50% of meals since admission. Per family in the room pt is eating less than usual; they relate this to pt being on a restricted diet and to hospital food not being as good. Pt states that 157 lbs is a normal weight for her; she reports weighing 169 lbs recently due to fluid retention.   Height: Ht Readings from Last 1 Encounters:  11/09/13 5\' 6"  (1.676 m)    Weight: Wt Readings from Last 1 Encounters:  11/10/13 157 lb 1.6 oz (71.26 kg)    Ideal Body Weight: 130 lbs  % Ideal Body Weight: 121%  Wt Readings from Last 10 Encounters:  11/10/13 157 lb 1.6 oz (71.26 kg)  08/07/13 147 lb 9.6 oz (66.951 kg)  02/06/13 150 lb 14.4 oz (68.448 kg)  07/25/12 157 lb 8 oz (71.442 kg)  04/01/12 147 lb 12.8 oz (67.042 kg)  12/07/11 150 lb 6.4 oz  (68.221 kg)  02/16/09 153 lb (69.4 kg)    Usual Body Weight: 157 lbs  % Usual Body Weight: 100%  BMI:  Body mass index is 25.37 kg/(m^2).  Estimated Nutritional Needs: Kcal: 1560-1750 Protein: 75-85 grams Fluid: 1.8 L/day  Skin: non-pitting RLE and LLE edema; skin tears on right wrist and left leg  Diet Order: Cardiac  EDUCATION NEEDS: -No education needs identified at this time   Intake/Output Summary (Last 24 hours) at 11/10/13 1441 Last data filed at 11/10/13 1359  Gross per 24 hour  Intake    490 ml  Output   1001 ml  Net   -511 ml    Last BM: 12/15  Labs:   Recent Labs Lab 11/09/13 1423 11/10/13 0407  NA 137 136  K 3.8 3.9  CL 95* 94*  CO2 34* 35*  BUN 20 22  CREATININE 1.26* 1.43*  CALCIUM 9.3 8.7  GLUCOSE 117* 100*    CBG (last 3)  No results found for this basename: GLUCAP,  in the last 72 hours  Scheduled Meds: . aspirin  81 mg Oral Daily  . heparin  5,000 Units Subcutaneous Q8H  . levothyroxine  125 mcg Oral QAC breakfast  . sodium chloride  3 mL Intravenous Q12H  . [START ON 11/11/2013] vancomycin  1,000 mg Intravenous Q48H    Continuous Infusions:   Past Medical History  Diagnosis Date  . MDS (myelodysplastic syndrome) 10/05/2011  . Pneumonia   .  Thyroid disease   . Hypertension   . Atrial fibrillation dx Mar. 2010    Past Surgical History  Procedure Laterality Date  . Orif femur fracture  2001    left  . Appendectomy      Ian Malkin RD, LDN Inpatient Clinical Dietitian Pager: [phone] After Hours Pager: [phone]

## 2013-11-11 DIAGNOSIS — I5031 Acute diastolic (congestive) heart failure: Secondary | ICD-10-CM | POA: Diagnosis not present

## 2013-11-11 DIAGNOSIS — R799 Abnormal finding of blood chemistry, unspecified: Secondary | ICD-10-CM | POA: Diagnosis not present

## 2013-11-11 DIAGNOSIS — I4891 Unspecified atrial fibrillation: Secondary | ICD-10-CM | POA: Diagnosis not present

## 2013-11-11 DIAGNOSIS — I509 Heart failure, unspecified: Secondary | ICD-10-CM | POA: Diagnosis not present

## 2013-11-11 DIAGNOSIS — L02419 Cutaneous abscess of limb, unspecified: Secondary | ICD-10-CM | POA: Diagnosis not present

## 2013-11-11 LAB — BASIC METABOLIC PANEL
BUN: 31 mg/dL — ABNORMAL HIGH (ref 6–23)
CO2: 35 mEq/L — ABNORMAL HIGH (ref 19–32)
Calcium: 8.9 mg/dL (ref 8.4–10.5)
Chloride: 95 mEq/L — ABNORMAL LOW (ref 96–112)
Glucose, Bld: 135 mg/dL — ABNORMAL HIGH (ref 70–99)
Potassium: 4 mEq/L (ref 3.5–5.1)
Sodium: 135 mEq/L (ref 135–145)

## 2013-11-11 LAB — CBC
HCT: 26.9 % — ABNORMAL LOW (ref 36.0–46.0)
Hemoglobin: 8.6 g/dL — ABNORMAL LOW (ref 12.0–15.0)
MCH: 30.7 pg (ref 26.0–34.0)
MCV: 96.1 fL (ref 78.0–100.0)
Platelets: 184 10*3/uL (ref 150–400)
RBC: 2.8 MIL/uL — ABNORMAL LOW (ref 3.87–5.11)
RDW: 18.1 % — ABNORMAL HIGH (ref 11.5–15.5)
WBC: 7.4 10*3/uL (ref 4.0–10.5)

## 2013-11-11 LAB — SURGICAL PCR SCREEN
MRSA, PCR: NEGATIVE
Staphylococcus aureus: NEGATIVE

## 2013-11-11 MED ORDER — FUROSEMIDE 10 MG/ML IJ SOLN
40.0000 mg | Freq: Every day | INTRAMUSCULAR | Status: DC
  Administered 2013-11-11: 11:00:00 40 mg via INTRAVENOUS
  Filled 2013-11-11 (×2): qty 4

## 2013-11-11 NOTE — Treatment Plan (Signed)
Will plan for surgery Thursday. NPO after midnight. Hold periop anticoagulation.  Mayra Reel, MD [facility] [phone] 12:10 PM

## 2013-11-11 NOTE — Progress Notes (Addendum)
SUBJECTIVE: No chest pain or SOB.   Tele: atrial fib, bundle branch block.   BP 115/59  Pulse 78  Temp(Src) 98.1 F (36.7 C) (Oral)  Resp 22  Ht 5\' 6"  (1.676 m)  Wt 155 lb 6.8 oz (70.5 kg)  BMI 25.10 kg/m2  SpO2 97%  Intake/Output Summary (Last 24 hours) at 11/11/13 0654 Last data filed at 11/10/13 2200  Gross per 24 hour  Intake    360 ml  Output    650 ml  Net   -290 ml    PHYSICAL EXAM General: Well developed, well nourished, in no acute distress. Alert and oriented x 3.  Psych:  Good affect, responds appropriately Neck: + JVD. No masses noted.  Lungs: Few basilar crackles. No wheezes or rhonci noted.  Heart: Irregular with systolic murmur noted. Abdomen: Bowel sounds are present. Soft, non-tender.  Extremities: No lower extremity edema.   LABS: Basic Metabolic Panel:  Recent Labs  81/19/14 0407 11/11/13 0354  NA 136 135  K 3.9 4.0  CL 94* 95*  CO2 35* 35*  GLUCOSE 100* 135*  BUN 22 31*  CREATININE 1.43* 1.71*  CALCIUM 8.7 8.9  MG  --  2.0   CBC:  Recent Labs  11/09/13 1423 11/09/13 1722 11/11/13 0354  WBC 7.9 8.6 7.4  NEUTROABS 6.4 7.1  --   HGB 10.2* 10.2* 8.6*  HCT 32.0* 31.6* 26.9*  MCV 95.8 95.2 96.1  PLT 240 224 184   Cardiac Enzymes:  Recent Labs  11/09/13 1722 11/09/13 2258 11/10/13 0407  TROPONINI <0.30 <0.30 <0.30   Current Meds: . aspirin  81 mg Oral Daily  . furosemide  40 mg Intravenous BID  . heparin  5,000 Units Subcutaneous Q8H  . levothyroxine  125 mcg Oral QAC breakfast  . sodium chloride  3 mL Intravenous Q12H  . vancomycin  1,000 mg Intravenous Q48H   Echo 11/05/13: Left ventricle: The cavity size was normal. There was moderate concentric hypertrophy. Systolic function was normal. The estimated ejection fraction was in the range of 60% to 65%. Wall motion was normal; there were no regional wall motion abnormalities. - Ventricular septum: Septal motion showed abnormal function and dyssynergy. - Aortic  valve: Trileaflet; mildly thickened, moderately calcified leaflets. Transvalvular velocity was increased. There was mild to moderate stenosis. Valve area: 1cm^2(VTI). Valve area: 1.05cm^2 (Vmax). - Mitral valve: Moderately fibrotic annulus. Normal thickness, moderately calcified leaflets . Mild regurgitation. - Left atrium: The atrium was severely dilated. - Right ventricle: The cavity size was mildly dilated. Wall thickness was normal. - Right atrium: The atrium was severely dilated. - Tricuspid valve: Moderate regurgitation. - Pulmonary arteries: Systolic pressure was moderately to severely increased. PA peak pressure: 73mm Hg (S). - Pericardium, extracardiac: A trivial pericardial effusion was identified.  ASSESSMENT AND PLAN: 80 yo female with history of diastolic CHF, MDS and permanent atrial fibrillation admitted after mechanical fall resulting in left proximal femur fracture and needs surgery. She was noted to be volume overloaded on exam when admitted. Normal EF and moderate-severe pulmonary HTN on echo.   1. Acute diastolic CHF: Preserved EF on echo but moderate to severe pulmonary HTN, moderate LVH. She is mildly volume overloaded on exam with crackles at bases and with elevated JVP (although she has known severe pulmonary HTN). CXR 11/09/13 with pulmonary edema. She has diuresed 1 liter since admission and is down 3 lbs over last 48 hours. Slight worsening of renal function with diuresis. Would give one dose  of IV Lasix this am then hold. Even though she is mildly volume overloaded, will likely have continued worsening of renal function with aggressive diuresis.  Hold Lasix only for SBP < 90.   2. Chronic atrial fibrillation: She is off metoprolol due to low blood pressure. HR is stable for now. Not anticoagulated due to fall risk and h/o MDS. No changes today.   3. Aortic valve stenosis: Mild to moderate by echo  4. Preoperative risk assessment: Risk will be high for surgery  given age, elevated PA pressures and current CHF exacerbation. Would continue diuresis this am then hold Lasix. Would plan on surgery as early as this afternoon if she is stable. She understands the increased risk of surgery given her advanced age and multiple medical issues.  She mentions not wanting surgery today since her family cannot be here.    Gina Wilkinson  12/17/20146:54 AM

## 2013-11-11 NOTE — Progress Notes (Addendum)
[facility] PROGRESS NOTE  Gina Wilkinson WUJ:[id number] DOB: [birth year] DOA: 11/09/2013  PCP: Cassell Clement, MD  Brief HPI: 79 year old female presented after a mechanical fall at her home. X-ray of the left hip in ED showed a left periprosthetic proximal femur fracture. Patient also complained of increasing lower extremity edema and shortness of breath for approximately 6 days prior to this admission. Echocardiogram was obtained on 11/05/2013. It showed E. of 60-65%, mild to moderate aortic stenosis, and moderate to severe pulmonary hypertension. In addition, the patient has been on cephalexin for approximately 5 days because of the chronic right lower extremity pretibial ulceration that has not been healing well. There was concern for infection.   Past medical history:  Past Medical History  Diagnosis Date  . MDS (myelodysplastic syndrome) 10/05/2011  . Pneumonia   . Thyroid disease   . Hypertension   . Atrial fibrillation dx Mar. 2010    Consultants: Cardiology  Procedures: None yet. ORIF planned this hospitalization  Antibiotics: Vancomycin 12/15-->  Subjective: Patient feels well. Has pain in left hip with movement but none otherwise. Denies any chest pain. Denies shortness of breath.  Objective: Vital Signs  Filed Vitals:   11/10/13 1357 11/10/13 2158 11/11/13 0156 11/11/13 0638  BP: 94/55 108/42 92/46 115/59  Pulse: 90 82 70 78  Temp: 98 F (36.7 C) 98.3 F (36.8 C) 97.6 F (36.4 C) 98.1 F (36.7 C)  TempSrc: Oral Oral Oral Oral  Resp: 20 20 22 22   Height:      Weight:    70.5 kg (155 lb 6.8 oz)  SpO2: 95% 95% 90% 97%    Intake/Output Summary (Last 24 hours) at 11/11/13 0735 Last data filed at 11/11/13 9562  Gross per 24 hour  Intake    360 ml  Output    900 ml  Net   -540 ml   Filed Weights   11/09/13 2307 11/10/13 0605 11/11/13 1308  Weight: 72.1 kg (158 lb 15.2 oz) 71.26 kg (157 lb 1.6 oz) 70.5 kg (155 lb 6.8 oz)    General  appearance: alert, cooperative, appears stated age and no distress Head: Normocephalic, without obvious abnormality, atraumatic Resp: Decreased air entry at bases with few crackles. No wheezing. Cardio: S1S2 irregularly irregula. Systolic murmur over precordium. No S3S4. No rubs murmurs or bruits. Minimal pedal edema noted bil LE. GI: soft, non-tender; bowel sounds normal; no masses,  no organomegaly Extremities: Some venous stasis and edema noted in Bil LE. Some erythema noted as well. Left LE is rotated. Pulses: Poorly palpable. Skin: erythema noted bil LE with dressing noted as well. Neurologic: Alert and oriented x 3. No focal deficits. Gait could not be assessed.  Lab Results:  Basic Metabolic Panel:  Recent Labs Lab 11/09/13 1423 11/10/13 0407 11/11/13 0354  NA 137 136 135  K 3.8 3.9 4.0  CL 95* 94* 95*  CO2 34* 35* 35*  GLUCOSE 117* 100* 135*  BUN 20 22 31*  CREATININE 1.26* 1.43* 1.71*  CALCIUM 9.3 8.7 8.9  MG  --   --  2.0   CBC:  Recent Labs Lab 11/09/13 1423 11/09/13 1722 11/11/13 0354  WBC 7.9 8.6 7.4  NEUTROABS 6.4 7.1  --   HGB 10.2* 10.2* 8.6*  HCT 32.0* 31.6* 26.9*  MCV 95.8 95.2 96.1  PLT 240 224 184   Cardiac Enzymes:  Recent Labs Lab 11/09/13 1722 11/09/13 2258 11/10/13 0407  TROPONINI <0.30 <0.30 <0.30   BNP (last 3 results)  Recent  Labs  11/09/13 1721  PROBNP 5281.0*    Studies/Results: Dg Chest 1 View  11/09/2013   CLINICAL DATA:  Left hip fracture. Recent shortness of breath and coughing. A preop for hip surgery.  EXAM: CHEST - 1 VIEW  COMPARISON:  Two-view chest 09/06/2012. CT chest with contrast 03/16/2009.  FINDINGS: The heart is enlarged. Edematous changes are superimposed on chronic interstitial coarsening. A large hiatal hernia is present. Chronic prominence of the right hila is stable. Atherosclerotic calcifications are present at the aortic arch.  IMPRESSION: 1. New interstitial airspace disease representing edema  superimposed on chronic change. 2. Stable cardiomegaly. 3. Stable large hiatal hernia.   Electronically Signed   By: Gennette Pac M.D.   On: 11/09/2013 14:53   Dg Hip Complete Left  11/09/2013   CLINICAL DATA:  Fall, pain.  EXAM: LEFT HIP - COMPLETE 2+ VIEW  COMPARISON:  None.  FINDINGS: There is a periprosthetic proximal femur fracture along the medial and distal aspect of the left total hip replacement femoral component. Slight angulation anteriorly.  IMPRESSION: Left periprosthetic proximal femur fracture.   Electronically Signed   By: Davonna Belling M.D.   On: 11/09/2013 13:32    Medications:  Scheduled: . aspirin  81 mg Oral Daily  . furosemide  40 mg Intravenous Daily  . heparin  5,000 Units Subcutaneous Q8H  . levothyroxine  125 mcg Oral QAC breakfast  . sodium chloride  3 mL Intravenous Q12H  . vancomycin  1,000 mg Intravenous Q48H   Continuous:  WUJ:WJXBJY chloride, acetaminophen, HYDROcodone-acetaminophen, morphine injection, ondansetron (ZOFRAN) IV, sodium chloride  Assessment/Plan:  Active Problems:   HYPOTHYROIDISM   MDS (myelodysplastic syndrome)   Hip fracture   Acute diastolic CHF (congestive heart failure)   Chronic atrial fibrillation   RBBB   Cellulitis and abscess of leg    Acute on Chronic diastolic CHF  Patient seems better. She has diuresed about 1 liter. She is asymptomatic. Creatinine noted to be higher. BP running on lower side. Discussed with cardiology. Lasix dose reduced. ECHO report reviewed. Holding BB for low BP. Elevated PA pressures noted on ECHO.  Elevated Creatinine Likely has chronic renal insufficiency. Rise in creatinine likely due to Lasix. Monitor creat and UO closely.  Chronic atrial fibrillation  Patient is on aspirin. Not a warfarin candidate due to myelodysplastic syndrome. Currently rate controlled. TSH is pending.   Cellulitis of bilateral lower extremities.  Although a little difficult to discern in the setting of venous stasis  dermatitis. Patient's daughter had stated that the erythema in her legs were worse than usual. Patient was started on Vancomycin. No significant improvement in erythema. Continue to monitor. Wound care RN consulted.   Right proximal femur fracture  Patient needs surgical intervention. Discussed with cardiology and their note reviewed. Patient is as stable and compensated as she will get. She may proceed to surgery later today or tomorrow. She will remain high risk for perioperative cardiac complications.  Myelodysplastic syndrome  Patient usually follows Dr. Cyndie Chime. Getting intermittent Aranesp. Drop in Hgb noted. Probably due to hip fracture. Monitor. No other overt bleeding noted.  Hypothyroidism Continue Synthroid. TSH is pending.  Code Status: DNR  DVT Prophylaxis: Heparin    Family Communication: No family at bedside. Discussed with patient. Will attempt calling daughter later today.  Disposition Plan: Will likely need SNF.    LOS: 2 days   [facility]  [facility] Pager [phone] 11/11/2013, 7:35 AM  If 8PM-8AM, please contact night-coverage at [email or web address], password [facility]

## 2013-11-11 NOTE — Progress Notes (Signed)
Clinical Social Work Department CLINICAL SOCIAL WORK PLACEMENT NOTE 11/11/2013  Patient:  KAAREN, NASS  Account Number:  [id number] Admit date:  11/09/2013  Clinical Social Worker:  Orpah Greek  Date/time:  11/11/2013 03:38 PM  Clinical Social Work is seeking post-discharge placement for this patient at the following level of care:   SKILLED NURSING   (*CSW will update this form in Epic as items are completed)   11/11/2013  Patient/family provided with [facility] Department of Clinical Social Work's list of facilities offering this level of care within the geographic area requested by the patient (or if unable, by the patient's family).  11/11/2013  Patient/family informed of their freedom to choose among providers that offer the needed level of care, that participate in Medicare, Medicaid or managed care program needed by the patient, have an available bed and are willing to accept the patient.  11/11/2013  Patient/family informed of [facility]' ownership interest in [facility], as well as of the fact that they are under no obligation to receive care at this facility.  PASARR submitted to EDS on 11/11/2013 PASARR number received from [employer] on 11/11/2013  FL2 transmitted to all facilities in geographic area requested by pt/family on  11/11/2013 FL2 transmitted to all facilities within larger geographic area on   Patient informed that his/her managed care company has contracts with or will negotiate with  certain facilities, including the following:     Patient/family informed of bed offers received:  11/11/2013 Patient chooses bed at  Physician recommends and patient chooses bed at    Patient to be transferred to  on   Patient to be transferred to facility by   The following physician request were entered in Epic:   Additional Comments: Unice Bailey, LCSW [facility] Clinical Social Worker cell #: [phone]

## 2013-11-11 NOTE — Progress Notes (Signed)
Clinical Social Work Department BRIEF PSYCHOSOCIAL ASSESSMENT 11/11/2013  Patient:  Gina Wilkinson, Gina Wilkinson     Account Number:  [id number]     Admit date:  11/09/2013  Clinical Social Worker:  Orpah Greek  Date/Time:  11/11/2013 03:08 PM  Referred by:  Physician  Date Referred:  11/11/2013 Referred for  SNF Placement   Other Referral:   Interview type:  Family Other interview type:   patient's daughter, Gina Wilkinson at bedside    PSYCHOSOCIAL DATA Living Status:  ALONE Admitted from facility:   Level of care:   Primary support name:  Gina Wilkinson (daughter) ph#: [phone] Primary support relationship to patient:  CHILD, ADULT Degree of support available:   good    CURRENT CONCERNS Current Concerns  Post-Acute Placement   Other Concerns:    SOCIAL WORK ASSESSMENT / PLAN CSW received consult that patient will most likely need SNF at discharge due to hip fracture.   Assessment/plan status:  Information/Referral to [facility] Other assessment/ plan:   Information/referral to community resources:   CSW completed FL2 and faxed information out to [facility] - provided bed offers to patient's daughter, Gina Wilkinson.    PATIENT'S/FAMILY'S RESPONSE TO PLAN OF CARE: Patient's daughter informed CSW that there is another daughter, Gina Braun who lives locally and is involved in her care. Patient will most likely be resistant to SNF placement. Daughter, Gina Wilkinson is very agreeable with plan for SNF and is realistic in the fact that patient may even need long-term placment (SNF or ALF) when finished with rehab.    Patient is scheduled for surgery in the am, per Gina Wilkinson. CSW will follow-up tomorrow afternoon.       Gina Bailey, LCSW [facility] Clinical Social Worker cell #: [phone]

## 2013-11-12 ENCOUNTER — Inpatient Hospital Stay (HOSPITAL_COMMUNITY): Payer: Medicare Other

## 2013-11-12 ENCOUNTER — Encounter (HOSPITAL_COMMUNITY): Payer: Medicare Other | Admitting: Anesthesiology

## 2013-11-12 ENCOUNTER — Encounter (HOSPITAL_COMMUNITY)
Admission: EM | Disposition: A | Discharge status: Skilled Nursing Facility | Payer: Self-pay | Source: Home / Self Care | Attending: Internal Medicine

## 2013-11-12 ENCOUNTER — Inpatient Hospital Stay (HOSPITAL_COMMUNITY): Payer: Medicare Other | Admitting: Anesthesiology

## 2013-11-12 ENCOUNTER — Encounter (HOSPITAL_COMMUNITY): Payer: Self-pay | Admitting: Anesthesiology

## 2013-11-12 DIAGNOSIS — I10 Essential (primary) hypertension: Secondary | ICD-10-CM | POA: Diagnosis not present

## 2013-11-12 DIAGNOSIS — S72409A Unspecified fracture of lower end of unspecified femur, initial encounter for closed fracture: Secondary | ICD-10-CM | POA: Diagnosis not present

## 2013-11-12 DIAGNOSIS — I5031 Acute diastolic (congestive) heart failure: Secondary | ICD-10-CM | POA: Diagnosis not present

## 2013-11-12 DIAGNOSIS — J811 Chronic pulmonary edema: Secondary | ICD-10-CM | POA: Diagnosis not present

## 2013-11-12 DIAGNOSIS — S7290XA Unspecified fracture of unspecified femur, initial encounter for closed fracture: Secondary | ICD-10-CM | POA: Diagnosis not present

## 2013-11-12 DIAGNOSIS — E440 Moderate protein-calorie malnutrition: Secondary | ICD-10-CM | POA: Diagnosis not present

## 2013-11-12 DIAGNOSIS — R799 Abnormal finding of blood chemistry, unspecified: Secondary | ICD-10-CM | POA: Diagnosis not present

## 2013-11-12 DIAGNOSIS — I4891 Unspecified atrial fibrillation: Secondary | ICD-10-CM | POA: Diagnosis not present

## 2013-11-12 DIAGNOSIS — I509 Heart failure, unspecified: Secondary | ICD-10-CM | POA: Diagnosis not present

## 2013-11-12 DIAGNOSIS — T84049A Periprosthetic fracture around unspecified internal prosthetic joint, initial encounter: Secondary | ICD-10-CM | POA: Diagnosis not present

## 2013-11-12 DIAGNOSIS — L02419 Cutaneous abscess of limb, unspecified: Secondary | ICD-10-CM | POA: Diagnosis not present

## 2013-11-12 DIAGNOSIS — I5033 Acute on chronic diastolic (congestive) heart failure: Secondary | ICD-10-CM | POA: Diagnosis not present

## 2013-11-12 DIAGNOSIS — S72309B Unspecified fracture of shaft of unspecified femur, initial encounter for open fracture type I or II: Secondary | ICD-10-CM | POA: Diagnosis not present

## 2013-11-12 DIAGNOSIS — IMO0002 Reserved for concepts with insufficient information to code with codable children: Secondary | ICD-10-CM | POA: Diagnosis not present

## 2013-11-12 DIAGNOSIS — J9620 Acute and chronic respiratory failure, unspecified whether with hypoxia or hypercapnia: Secondary | ICD-10-CM | POA: Diagnosis not present

## 2013-11-12 HISTORY — PX: ORIF PERIPROSTHETIC FRACTURE: SHX5034

## 2013-11-12 LAB — BASIC METABOLIC PANEL
BUN: 28 mg/dL — ABNORMAL HIGH (ref 6–23)
CO2: 36 mEq/L — ABNORMAL HIGH (ref 19–32)
Calcium: 8.6 mg/dL (ref 8.4–10.5)
Chloride: 91 mEq/L — ABNORMAL LOW (ref 96–112)
Creatinine, Ser: 1.49 mg/dL — ABNORMAL HIGH (ref 0.50–1.10)
GFR calc Af Amer: 34 mL/min — ABNORMAL LOW (ref >90)
GFR calc Af Amer: 40 mL/min — ABNORMAL LOW (ref >90)
GFR calc non Af Amer: 34 mL/min — ABNORMAL LOW (ref >90)
Potassium: 4.2 mEq/L (ref 3.5–5.1)
Sodium: 134 mEq/L — ABNORMAL LOW (ref 135–145)
Sodium: 137 mEq/L (ref 135–145)

## 2013-11-12 LAB — CBC
HCT: 26.7 % — ABNORMAL LOW (ref 36.0–46.0)
Hemoglobin: 10.3 g/dL — ABNORMAL LOW (ref 12.0–15.0)
MCV: 96 fL (ref 78.0–100.0)
MCV: 96.1 fL (ref 78.0–100.0)
Platelets: 190 10*3/uL (ref 150–400)
Platelets: 209 10*3/uL (ref 150–400)
RBC: 2.78 MIL/uL — ABNORMAL LOW (ref 3.87–5.11)
RBC: 3.33 MIL/uL — ABNORMAL LOW (ref 3.87–5.11)
RDW: 17.9 % — ABNORMAL HIGH (ref 11.5–15.5)
WBC: 7 10*3/uL (ref 4.0–10.5)
WBC: 10.3 10*3/uL (ref 4.0–10.5)

## 2013-11-12 LAB — LACTIC ACID, PLASMA: Lactic Acid, Venous: 2.1 mmol/L (ref 0.5–2.2)

## 2013-11-12 LAB — TROPONIN I
Troponin I: <0.3 ng/mL (ref <0.30)
Troponin I: <0.3 ng/mL (ref <0.30)

## 2013-11-12 SURGERY — OPEN REDUCTION INTERNAL FIXATION (ORIF) PERIPROSTHETIC FRACTURE
Anesthesia: General | Site: Hip | Laterality: Left

## 2013-11-12 MED ORDER — BIOTENE DRY MOUTH MT LIQD
15.0000 mL | Freq: Two times a day (BID) | OROMUCOSAL | Status: DC
  Administered 2013-11-13 – 2013-11-18 (×10): 15 mL via OROMUCOSAL

## 2013-11-12 MED ORDER — CEFAZOLIN SODIUM-DEXTROSE 2-3 GM-% IV SOLR: 2.0000 g | Freq: Four times a day (QID) | INTRAVENOUS | Status: DC

## 2013-11-12 MED ORDER — METOCLOPRAMIDE HCL 5 MG/ML IJ SOLN: 5.0000 mg | Freq: Three times a day (TID) | INTRAMUSCULAR | Status: DC | PRN

## 2013-11-12 MED ORDER — PHENYLEPHRINE HCL 10 MG/ML IJ SOLN
10.0000 mg | INTRAVENOUS | Status: DC | PRN
  Administered 2013-11-12: 75 ug/min via INTRAVENOUS

## 2013-11-12 MED ORDER — FENTANYL CITRATE 0.05 MG/ML IJ SOLN
INTRAMUSCULAR | Status: AC
  Filled 2013-11-12: qty 2

## 2013-11-12 MED ORDER — KETAMINE HCL 50 MG/ML IJ SOLN
INTRAMUSCULAR | Status: AC
  Filled 2013-11-12: qty 10

## 2013-11-12 MED ORDER — SUCCINYLCHOLINE CHLORIDE 20 MG/ML IJ SOLN
INTRAMUSCULAR | Status: AC
  Filled 2013-11-12: qty 1

## 2013-11-12 MED ORDER — AMIODARONE HCL IN DEXTROSE 360-4.14 MG/200ML-% IV SOLN
60.0000 mg/h | INTRAVENOUS | Status: AC
  Administered 2013-11-12 (×2): 60 mg/h via INTRAVENOUS
  Filled 2013-11-12: qty 200

## 2013-11-12 MED ORDER — ACETAMINOPHEN 650 MG RE SUPP: 650.0000 mg | Freq: Four times a day (QID) | RECTAL | Status: DC | PRN

## 2013-11-12 MED ORDER — CEFAZOLIN SODIUM-DEXTROSE 2-3 GM-% IV SOLR
INTRAVENOUS | Status: DC | PRN
  Administered 2013-11-12: 2 g via INTRAVENOUS

## 2013-11-12 MED ORDER — PROPOFOL 10 MG/ML IV BOLUS
INTRAVENOUS | Status: AC
  Filled 2013-11-12: qty 20

## 2013-11-12 MED ORDER — OXYCODONE HCL 5 MG PO TABS: 5.0000 mg | ORAL_TABLET | ORAL | Status: DC | PRN

## 2013-11-12 MED ORDER — SUCCINYLCHOLINE CHLORIDE 20 MG/ML IJ SOLN
INTRAMUSCULAR | Status: DC | PRN
  Administered 2013-11-12: 100 mg via INTRAVENOUS

## 2013-11-12 MED ORDER — NEOSTIGMINE METHYLSULFATE 1 MG/ML IJ SOLN
INTRAMUSCULAR | Status: DC | PRN
  Administered 2013-11-12: 3 mg via INTRAVENOUS

## 2013-11-12 MED ORDER — PROPOFOL 10 MG/ML IV BOLUS
INTRAVENOUS | Status: DC | PRN
  Administered 2013-11-12: 20 mg via INTRAVENOUS
  Administered 2013-11-12: 10 mg via INTRAVENOUS
  Administered 2013-11-12: 60 mg via INTRAVENOUS

## 2013-11-12 MED ORDER — LIDOCAINE HCL (CARDIAC) 20 MG/ML IV SOLN
INTRAVENOUS | Status: DC | PRN
  Administered 2013-11-12: 20 mg via INTRAVENOUS

## 2013-11-12 MED ORDER — CEFAZOLIN SODIUM-DEXTROSE 2-3 GM-% IV SOLR
INTRAVENOUS | Status: AC
  Filled 2013-11-12: qty 50

## 2013-11-12 MED ORDER — METOCLOPRAMIDE HCL 10 MG PO TABS: 5.0000 mg | ORAL_TABLET | Freq: Three times a day (TID) | ORAL | Status: DC | PRN

## 2013-11-12 MED ORDER — ONDANSETRON HCL 4 MG PO TABS: 4.0000 mg | ORAL_TABLET | Freq: Four times a day (QID) | ORAL | Status: DC | PRN

## 2013-11-12 MED ORDER — AMIODARONE LOAD VIA INFUSION
150.0000 mg | Freq: Once | INTRAVENOUS | Status: AC
  Administered 2013-11-12: 150 mg via INTRAVENOUS
  Filled 2013-11-12: qty 83.34

## 2013-11-12 MED ORDER — AMIODARONE HCL IN DEXTROSE 360-4.14 MG/200ML-% IV SOLN
30.0000 mg/h | INTRAVENOUS | Status: DC
  Administered 2013-11-13: 30 mg/h via INTRAVENOUS
  Filled 2013-11-12: qty 200

## 2013-11-12 MED ORDER — MORPHINE SULFATE 2 MG/ML IJ SOLN: 0.5000 mg | INTRAMUSCULAR | Status: DC | PRN

## 2013-11-12 MED ORDER — SENNA 8.6 MG PO TABS
1.0000 | ORAL_TABLET | Freq: Two times a day (BID) | ORAL | Status: DC
  Administered 2013-11-12 – 2013-11-18 (×11): 8.6 mg via ORAL
  Filled 2013-11-12 (×10): qty 1

## 2013-11-12 MED ORDER — ONDANSETRON HCL 4 MG/2ML IJ SOLN
INTRAMUSCULAR | Status: DC | PRN
  Administered 2013-11-12 (×2): 2 mg via INTRAVENOUS

## 2013-11-12 MED ORDER — SODIUM CHLORIDE 0.9 % IV SOLN
INTRAVENOUS | Status: DC | PRN
  Administered 2013-11-12: 13:00:00 via INTRAVENOUS

## 2013-11-12 MED ORDER — ONDANSETRON HCL 4 MG/2ML IJ SOLN
INTRAMUSCULAR | Status: AC
  Filled 2013-11-12: qty 2

## 2013-11-12 MED ORDER — HYDROCODONE-ACETAMINOPHEN 5-325 MG PO TABS
1.0000 | ORAL_TABLET | ORAL | Status: DC | PRN
  Administered 2013-11-13 – 2013-11-15 (×4): 1 via ORAL
  Administered 2013-11-16 – 2013-11-17 (×2): 2 via ORAL
  Administered 2013-11-17 – 2013-11-18 (×2): 1 via ORAL
  Filled 2013-11-12: qty 2
  Filled 2013-11-12: qty 1
  Filled 2013-11-12 (×2): qty 2
  Filled 2013-11-12 (×3): qty 1
  Filled 2013-11-12: qty 2

## 2013-11-12 MED ORDER — SODIUM CHLORIDE 0.9 % IV SOLN
INTRAVENOUS | Status: DC | PRN
  Administered 2013-11-12: 12:00:00 via INTRAVENOUS

## 2013-11-12 MED ORDER — POLYETHYLENE GLYCOL 3350 17 G PO PACK
17.0000 g | PACK | Freq: Every day | ORAL | Status: DC | PRN
  Administered 2013-11-18: 10:00:00 17 g via ORAL
  Filled 2013-11-12: qty 1

## 2013-11-12 MED ORDER — SORBITOL 70 % SOLN
30.0000 mL | Freq: Every day | Status: DC | PRN
  Administered 2013-11-18: 10:00:00 30 mL via ORAL
  Filled 2013-11-12 (×2): qty 30

## 2013-11-12 MED ORDER — MIDAZOLAM HCL 5 MG/5ML IJ SOLN
INTRAMUSCULAR | Status: DC | PRN
  Administered 2013-11-12: 0.5 mg via INTRAVENOUS

## 2013-11-12 MED ORDER — CISATRACURIUM BESYLATE (PF) 10 MG/5ML IV SOLN
INTRAVENOUS | Status: DC | PRN
  Administered 2013-11-12: 5 mg via INTRAVENOUS

## 2013-11-12 MED ORDER — MAGNESIUM CITRATE PO SOLN: 1.0000 | Freq: Once | ORAL | Status: AC | PRN

## 2013-11-12 MED ORDER — ASPIRIN EC 325 MG PO TBEC
325.0000 mg | DELAYED_RELEASE_TABLET | Freq: Two times a day (BID) | ORAL | Status: DC
  Administered 2013-11-13 – 2013-11-18 (×12): 325 mg via ORAL
  Filled 2013-11-12 (×13): qty 1

## 2013-11-12 MED ORDER — DILTIAZEM HCL 25 MG/5ML IV SOLN
INTRAVENOUS | Status: AC
  Filled 2013-11-12: qty 5

## 2013-11-12 MED ORDER — ACETAMINOPHEN 325 MG PO TABS
650.0000 mg | ORAL_TABLET | Freq: Four times a day (QID) | ORAL | Status: DC | PRN
  Filled 2013-11-12 (×3): qty 2

## 2013-11-12 MED ORDER — CHLORHEXIDINE GLUCONATE 0.12 % MT SOLN
15.0000 mL | Freq: Two times a day (BID) | OROMUCOSAL | Status: DC
  Administered 2013-11-12 – 2013-11-18 (×7): 15 mL via OROMUCOSAL
  Filled 2013-11-12 (×13): qty 15

## 2013-11-12 MED ORDER — LIDOCAINE HCL (CARDIAC) 20 MG/ML IV SOLN
INTRAVENOUS | Status: AC
  Filled 2013-11-12: qty 5

## 2013-11-12 MED ORDER — ALUM & MAG HYDROXIDE-SIMETH 200-200-20 MG/5ML PO SUSP: 30.0000 mL | ORAL | Status: DC | PRN

## 2013-11-12 MED ORDER — PHENOL 1.4 % MT LIQD: 1.0000 | OROMUCOSAL | Status: DC | PRN

## 2013-11-12 MED ORDER — ONDANSETRON HCL 4 MG/2ML IJ SOLN: 4.0000 mg | Freq: Four times a day (QID) | INTRAMUSCULAR | Status: DC | PRN

## 2013-11-12 MED ORDER — DILTIAZEM LOAD VIA INFUSION
3.0000 mg | Freq: Once | INTRAVENOUS | Status: AC
  Administered 2013-11-12: 3 mg via INTRAVENOUS
  Filled 2013-11-12: qty 3

## 2013-11-12 MED ORDER — FENTANYL CITRATE 0.05 MG/ML IJ SOLN: 25.0000 ug | INTRAMUSCULAR | Status: DC | PRN

## 2013-11-12 MED ORDER — FENTANYL CITRATE 0.05 MG/ML IJ SOLN
INTRAMUSCULAR | Status: DC | PRN
  Administered 2013-11-12: 50 ug via INTRAVENOUS
  Administered 2013-11-12: 12.5 ug via INTRAVENOUS
  Administered 2013-11-12 (×2): 25 ug via INTRAVENOUS
  Administered 2013-11-12: 12.5 ug via INTRAVENOUS
  Administered 2013-11-12 (×2): 25 ug via INTRAVENOUS

## 2013-11-12 MED ORDER — PHENYLEPHRINE 40 MCG/ML (10ML) SYRINGE FOR IV PUSH (FOR BLOOD PRESSURE SUPPORT)
PREFILLED_SYRINGE | INTRAVENOUS | Status: AC
  Filled 2013-11-12: qty 10

## 2013-11-12 MED ORDER — SODIUM CHLORIDE 0.9 % IV SOLN
INTRAVENOUS | Status: DC
  Administered 2013-11-12: 1000 mL via INTRAVENOUS

## 2013-11-12 MED ORDER — LACTATED RINGERS IV SOLN
INTRAVENOUS | Status: DC
  Administered 2013-11-12: 1000 mL via INTRAVENOUS

## 2013-11-12 MED ORDER — METHOCARBAMOL 100 MG/ML IJ SOLN
500.0000 mg | Freq: Four times a day (QID) | INTRAVENOUS | Status: DC | PRN
  Administered 2013-11-12: 500 mg via INTRAVENOUS
  Filled 2013-11-12: qty 5

## 2013-11-12 MED ORDER — DEXTROSE 5 % IV SOLN
5.0000 mg/h | INTRAVENOUS | Status: DC
  Administered 2013-11-12: 10 mg/h via INTRAVENOUS
  Filled 2013-11-12: qty 100

## 2013-11-12 MED ORDER — MENTHOL 3 MG MT LOZG
1.0000 | LOZENGE | OROMUCOSAL | Status: DC | PRN
  Filled 2013-11-12: qty 9

## 2013-11-12 MED ORDER — GLYCOPYRROLATE 0.2 MG/ML IJ SOLN
INTRAMUSCULAR | Status: DC | PRN
  Administered 2013-11-12: 0.2 mg via INTRAVENOUS
  Administered 2013-11-12: 0.4 mg via INTRAVENOUS

## 2013-11-12 MED ORDER — METHOCARBAMOL 500 MG PO TABS
500.0000 mg | ORAL_TABLET | Freq: Four times a day (QID) | ORAL | Status: DC | PRN
  Administered 2013-11-18: 14:00:00 500 mg via ORAL
  Filled 2013-11-12: qty 1

## 2013-11-12 MED ORDER — HYDROCODONE-ACETAMINOPHEN 5-325 MG PO TABS: 1.0000 | ORAL_TABLET | Freq: Four times a day (QID) | ORAL | Status: DC | PRN

## 2013-11-12 MED ORDER — MIDAZOLAM HCL 2 MG/2ML IJ SOLN
INTRAMUSCULAR | Status: AC
  Filled 2013-11-12: qty 2

## 2013-11-12 SURGICAL SUPPLY — 64 items
BAG SPEC THK2 15X12 ZIP CLS (MISCELLANEOUS) ×1
BAG ZIPLOCK 12X15 (MISCELLANEOUS) ×2 IMPLANT
BIT DRILL 4.3 (BIT) ×1 IMPLANT
BLADE EXTENDED COATED 6.5IN (ELECTRODE) ×2 IMPLANT
CABLE (Orthopedic Implant) ×10 IMPLANT
DRAPE HIP W/POCKET STRL (DRAPE) IMPLANT
DRAPE INCISE IOBAN 85X60 (DRAPES) IMPLANT
DRAPE ORTHO SPLIT 77X108 STRL (DRAPES) ×4
DRAPE POUCH INSTRU U-SHP 10X18 (DRAPES) ×2 IMPLANT
DRAPE SURG 17X11 SM STRL (DRAPES) ×2 IMPLANT
DRAPE SURG ORHT 6 SPLT 77X108 (DRAPES) ×2 IMPLANT
DRAPE U-SHAPE 47X51 STRL (DRAPES) ×2 IMPLANT
DRILL BIT 4.3 (BIT) ×2
DRSG AQUACEL AG ADV 3.5X14 (GAUZE/BANDAGES/DRESSINGS) ×2 IMPLANT
DRSG MEPILEX BORDER 4X8 (GAUZE/BANDAGES/DRESSINGS) ×2 IMPLANT
DURAPREP 26ML APPLICATOR (WOUND CARE) ×2 IMPLANT
ELECT REM PT RETURN 9FT ADLT (ELECTROSURGICAL) ×2
ELECTRODE REM PT RTRN 9FT ADLT (ELECTROSURGICAL) ×1 IMPLANT
EVACUATOR 1/8 PVC DRAIN (DRAIN) ×2 IMPLANT
FACESHIELD LNG OPTICON STERILE (SAFETY) ×2 IMPLANT
GAUZE XEROFORM 5X9 LF (GAUZE/BANDAGES/DRESSINGS) ×2 IMPLANT
GLOVE BIO SURGEON STRL SZ7.5 (GLOVE) ×2 IMPLANT
GLOVE BIOGEL PI IND STRL 7.0 (GLOVE) ×1 IMPLANT
GLOVE BIOGEL PI IND STRL 7.5 (GLOVE) ×1 IMPLANT
GLOVE BIOGEL PI IND STRL 8 (GLOVE) IMPLANT
GLOVE BIOGEL PI IND STRL 8.5 (GLOVE) IMPLANT
GLOVE BIOGEL PI INDICATOR 7.0 (GLOVE) ×1
GLOVE BIOGEL PI INDICATOR 7.5 (GLOVE) ×1
GLOVE BIOGEL PI INDICATOR 8 (GLOVE)
GLOVE BIOGEL PI INDICATOR 8.5 (GLOVE)
GLOVE ECLIPSE 8.0 STRL XLNG CF (GLOVE) ×2 IMPLANT
GLOVE ORTHO TXT STRL SZ7.5 (GLOVE) ×2 IMPLANT
GLOVE SURG ORTHO 8.0 STRL STRW (GLOVE) IMPLANT
GLOVE SURG SS PI 8.0 STRL IVOR (GLOVE) IMPLANT
GOWN STRL REIN XL XLG (GOWN DISPOSABLE) ×6 IMPLANT
HEX DRIVE 2.5 (BIT) ×6 IMPLANT
KIT BASIN OR (CUSTOM PROCEDURE TRAY) ×2 IMPLANT
MANIFOLD NEPTUNE II (INSTRUMENTS) ×2 IMPLANT
NS IRRIG 1000ML POUR BTL (IV SOLUTION) ×4 IMPLANT
PACK TOTAL JOINT (CUSTOM PROCEDURE TRAY) ×2 IMPLANT
PAD ABD 8X10 STRL (GAUZE/BANDAGES/DRESSINGS) IMPLANT
PASSER SUT SWANSON 36MM LOOP (INSTRUMENTS) IMPLANT
PLATE FEMUR SHAFT NCB (Plate) ×2 IMPLANT
POSITIONER SURGICAL ARM (MISCELLANEOUS) ×2 IMPLANT
SCREW 5.0 32MM (Screw) ×4 IMPLANT
SCREW NCB 5.0X34MM (Screw) ×2 IMPLANT
SCREW NCB 5.0X36MM (Screw) ×2 IMPLANT
SPONGE GAUZE 4X4 12PLY (GAUZE/BANDAGES/DRESSINGS) ×4 IMPLANT
SPONGE LAP 18X18 X RAY DECT (DISPOSABLE) IMPLANT
SPONGE LAP 4X18 X RAY DECT (DISPOSABLE) IMPLANT
STAPLER VISISTAT 35W (STAPLE) IMPLANT
STRIP CLOSURE SKIN 1/2X4 (GAUZE/BANDAGES/DRESSINGS) IMPLANT
SUCTION FRAZIER 12FR DISP (SUCTIONS) IMPLANT
SUCTION FRAZIER TIP 10 FR DISP (SUCTIONS) ×2 IMPLANT
SUT PDS AB 1 CT1 27 (SUTURE) ×4 IMPLANT
SUT VIC AB 0 CT1 27 (SUTURE) ×2
SUT VIC AB 0 CT1 27XBRD ANTBC (SUTURE) ×1 IMPLANT
SUT VIC AB 1 CT1 36 (SUTURE) ×2 IMPLANT
SUT VIC AB 2-0 CT1 27 (SUTURE) ×4
SUT VIC AB 2-0 CT1 TAPERPNT 27 (SUTURE) ×2 IMPLANT
SUT VIC AB 4-0 PS2 27 (SUTURE) IMPLANT
TOWEL OR 17X26 10 PK STRL BLUE (TOWEL DISPOSABLE) ×4 IMPLANT
TRAY FOLEY CATH 14FRSI W/METER (CATHETERS) IMPLANT
WATER STERILE IRR 1500ML POUR (IV SOLUTION) ×2 IMPLANT

## 2013-11-12 NOTE — Progress Notes (Signed)
Patient became hypotensive to SBP 80s, MAPs 40s on dilt so discontinued.  Heart rate increased to 120 units and systolic blood pressures recovered. Cardiology who recommended transitioning to amiodarone infusion with a slight overlap with the diltiazem at 5 mg per hour. If her blood pressure dropped again on diltiazem 5 mg per hour, we will discontinue as the patient is asymptomatic. When transitioned to amiodarone, if her blood pressures remained stable, could consider low dose of Lasix.  Recommend judicious use of IV fluids and cautious use of Lasix in the postoperative setting to avoid hypovolemia from fluid shifts or worsening heart failure.  Additionally, the pharmacist recognized that the patient received a cephalosporin perioperatively which may have caused an allergic reaction. Patient has history of anaphylaxis to penicillin. -  Monitor clinically for hives, swelling of the throat lips and tongue, and other signs of anaphylaxis

## 2013-11-12 NOTE — Transfer of Care (Signed)
Immediate Anesthesia Transfer of Care Note  Patient: Gina Wilkinson  Procedure(s) Performed: Procedure(s): OPEN REDUCTION INTERNAL FIXATION (ORIF) LEFT HIP PERIPROSTHETIC FRACTURE, (Left)  Patient Location: PACU  Anesthesia Type:General  Level of Consciousness: awake, alert , oriented and patient cooperative  Airway & Oxygen Therapy: Patient Spontanous Breathing and Patient connected to face mask oxygen  Post-op Assessment: Report given to PACU RN and Post -op Vital signs reviewed and unstable, Anesthesiologist notified  Post vital signs: unstable.  Dr. Council Mechanic is here.  Complications: No apparent anesthesia complications Pt. Is 89, with DNR order.

## 2013-11-12 NOTE — Anesthesia Preprocedure Evaluation (Addendum)
Anesthesia Evaluation  Patient identified by MRN, date of birth, ID band Patient awake    Reviewed: Allergy & Precautions, H&P , NPO status , Patient's Chart, lab work & pertinent test results  Airway Mallampati: II TM Distance: >3 FB Neck ROM: Full    Dental no notable dental hx.    Pulmonary pneumonia -,  breath sounds clear to auscultation  Pulmonary exam normal       Cardiovascular Exercise Tolerance: Poor hypertension, Pt. on medications +CHF + dysrhythmias Rhythm:Regular Rate:Normal  Recent acute diastolic CHF. IV lasix given over last three days. ECHO 11-05-13 reviewed. EF 60-65% with moderate to severe pulmonary HTN.   Dr. Clifton James and Dr. Chancy Milroy notes reviewed.   Neuro/Psych negative neurological ROS  negative psych ROS   GI/Hepatic negative GI ROS, Neg liver ROS,   Endo/Other  Hypothyroidism   Renal/GU negative Renal ROS  negative genitourinary   Musculoskeletal negative musculoskeletal ROS (+)   Abdominal   Peds negative pediatric ROS (+)  Hematology  (+) Blood dyscrasia, ,   Anesthesia Other Findings   Reproductive/Obstetrics negative OB ROS                      Anesthesia Physical Anesthesia Plan  ASA: IV  Anesthesia Plan: General   Post-op Pain Management:    Induction: Intravenous  Airway Management Planned: Oral ETT  Additional Equipment:   Intra-op Plan:   Post-operative Plan: Extubation in OR  Informed Consent: I have reviewed the patients History and Physical, chart, labs and discussed the procedure including the risks, benefits and alternatives for the proposed anesthesia with the patient or authorized representative who has indicated his/her understanding and acceptance.   Dental advisory given  Plan Discussed with: CRNA  Anesthesia Plan Comments: (High risk for cardiopulmonary complications. Discussed with patient, surgeon, and patient's two daughters.  DNR status discussed.)      Anesthesia Quick Evaluation

## 2013-11-12 NOTE — Progress Notes (Addendum)
Called by Doctor Osvaldo Shipper regarding patient.    Postoperatively, the patient went into A. fib and RVR with heart rate in the 150s. Anesthesia was at the bedside and gave her diltiazem 1 mg IV x3. The patient remained asymptomatic, her blood pressure remained stable in the low 100 systolic. Her heart rate remained elevated so she was started on a diltiazem drip at 10 mg per hour. Review of her most recent echocardiogram from December 11 demonstrate she does have a preserved ejection fraction. She had approximately 200 mL of blood loss during surgery and 700 urine output. She had 1.5 L of fluid intraoperatively and received several 100 cc of IV fluid postoperatively to assist in bringing down her heart rate.  Subjective: The patient denies chest pain, shortness of breath, lightheadedness. She denies nausea or vomiting. She states that she feels fairly well. Objective:   Wt Readings from Last 3 Encounters:  11/12/13 70.3 kg (154 lb 15.7 oz)  11/12/13 70.3 kg (154 lb 15.7 oz)  08/07/13 66.951 kg (147 lb 9.6 oz)   Temp Readings from Last 3 Encounters:  11/12/13 98 F (36.7 C)   11/12/13 98 F (36.7 C)   09/04/13 98.1 F (36.7 C) Oral   BP Readings from Last 3 Encounters:  11/12/13 90/43  11/12/13 90/43  09/04/13 125/65   Pulse Readings from Last 3 Encounters:  11/12/13 116  11/12/13 116  09/04/13 76   Gen:  Caucasian female, no acute distress, facemask in place HEENT: NCAT, MMM CV:  IRRR, tachycardic to the 120s PULM:  Rales bilaterally, left worse than right.  No wheezes or rhonchi ABD:  Hypoactive BS, soft, NT/ND MSK:  Right hip dressing c/d/i, drain with minimal blood, no evidence of hematoma  ECG:  Atrial fibrillation with RBBB.  New ST-segment depressions in V3 and V4.    A/P:  A-fib with RVR post-operatively.  Patient has history of persistent a-fib.  May have been precipitated by stress from surgery, pain, anemia.  ACS is possible however less likely given that patient  is asymptomatic and ECG findings are most likely secondary to tachycardia induced repolarization abnl. -  Telemetry -  Transfer to stepdown for ongoing care -  Continue diltiazem gtt for now, goal HR 90-100 while maintaining SBP > 90.  May titrate per protocol -  If hypotensive and still tachycardic, would recommend against digoxin due to CKD, but could consider amiodarone.   -  CXR:  Atelectasis vs. Infiltrate on right -  CBC -  BMP -  Troponins q6h -  Trend uop carefully  -  Cardiology following  Infiltrate on CXR:  May be atelectasis, pneumonia, asymmetric pulmonary edema -  Consider blood cultures and escalation of antibiotics to cefepime + vanc if signs of pneumonia -  Will administer low dose lasix if her blood pressure remains > 100 systolic on current dilt dose  Acute on chronic diastolic heart failure -  Monitor uop carefully -  Judicious IVF -  Low dose lasix as blood pressure tolerates

## 2013-11-12 NOTE — Progress Notes (Signed)
Diltiazem  drip started per verbal of Dr. Council Mechanic.

## 2013-11-12 NOTE — H&P (Signed)
H&P update  The surgical history has been reviewed and remains accurate without interval change.  The patient was re-examined and patient's physiologic condition has not changed significantly in the last 30 days. The condition still exists that makes this procedure necessary. The treatment plan remains the same, without new options for care.  No new pharmacological allergies or types of therapy has been initiated that would change the plan or the appropriateness of the plan.  The patient and/or family understand the potential benefits and risks.  Mayra Reel, MD 11/12/2013 8:17 AM

## 2013-11-12 NOTE — Progress Notes (Signed)
     SUBJECTIVE: No chest pain or SOB. Only c/o left hip pain.   BP 106/46  Pulse 88  Temp(Src) 98.2 F (36.8 C) (Oral)  Resp 20  Ht 5\' 6"  (1.676 m)  Wt 154 lb 15.7 oz (70.3 kg)  BMI 25.03 kg/m2  SpO2 97%  Intake/Output Summary (Last 24 hours) at 11/12/13 1610 Last data filed at 11/12/13 0414  Gross per 24 hour  Intake    480 ml  Output   1600 ml  Net  -1120 ml    PHYSICAL EXAM General: Well developed, well nourished, in no acute distress. Alert and oriented x 3.  Psych:  Good affect, responds appropriately Neck: + JVD. No masses noted.  Lungs: Clear bilaterally with no wheezes or rhonci noted.  Heart: Irreg irreg with systolic murmur noted. Abdomen: Bowel sounds are present. Soft, non-tender.  Extremities: No lower extremity edema.   LABS: Basic Metabolic Panel:  Recent Labs  96/04/54 0407 11/11/13 0354 11/12/13 0400  NA 136 135 134*  K 3.9 4.0 4.0  CL 94* 95* 91*  CO2 35* 35* 36*  GLUCOSE 100* 135* 100*  BUN 22 31* 28*  CREATININE 1.43* 1.71* 1.49*  CALCIUM 8.7 8.9 8.5  MG  --  2.0  --    CBC:  Recent Labs  11/09/13 1423 11/09/13 1722 11/11/13 0354 11/12/13 0400  WBC 7.9 8.6 7.4 7.0  NEUTROABS 6.4 7.1  --   --   HGB 10.2* 10.2* 8.6* 8.5*  HCT 32.0* 31.6* 26.9* 26.7*  MCV 95.8 95.2 96.1 96.0  PLT 240 224 184 190   Cardiac Enzymes:  Recent Labs  11/09/13 1722 11/09/13 2258 11/10/13 0407  TROPONINI <0.30 <0.30 <0.30   Current Meds: . aspirin  81 mg Oral Daily  . furosemide  40 mg Intravenous Daily  . levothyroxine  125 mcg Oral QAC breakfast  . sodium chloride  3 mL Intravenous Q12H  . vancomycin  1,000 mg Intravenous Q48H     ASSESSMENT AND PLAN: 79 yo female with history of diastolic CHF, MDS and permanent atrial fibrillation admitted after mechanical fall resulting in left proximal femur fracture and needs surgery. She was noted to be volume overloaded on exam when admitted. Normal EF and moderate-severe pulmonary HTN on echo.    1. Acute diastolic CHF: Preserved EF on echo but moderate to severe pulmonary HTN, moderate LVH. She is mildly volume overloaded on exam with crackles at bases and with elevated JVP (although she has known severe pulmonary HTN). She has diuresed 2 liters since admission and is down 4 lbs over last 72 hours. Renal function stable after diuresis. Lasix will be held   2. Chronic atrial fibrillation: She is off metoprolol due to low blood pressure. HR is stable for now. Not anticoagulated due to fall risk and h/o MDS. No changes today.   3. Aortic valve stenosis: Mild to moderate by echo   4. Preoperative risk assessment: Risk will be high for surgery given age, elevated PA pressures and current CHF exacerbation. Would plan on surgery later today. She understands the increased risk of surgery given her advanced age and multiple medical issues.    Triston Skare  12/18/20146:29 AM

## 2013-11-12 NOTE — Anesthesia Postprocedure Evaluation (Signed)
  Anesthesia Post-op Note  Patient: Gina Wilkinson  Procedure(s) Performed: Procedure(s) (LRB): OPEN REDUCTION INTERNAL FIXATION (ORIF) LEFT HIP PERIPROSTHETIC FRACTURE, (Left)  Patient Location: PACU  Anesthesia Type: General  Level of Consciousness: awake and alert   Airway and Oxygen Therapy: Patient Spontanous Breathing  Post-op Pain: mild  Post-op Assessment: Post-op Vital signs reviewed, Patient's Cardiovascular Status Stable, Respiratory Function Stable, Patent Airway and No signs of Nausea or vomiting  Last Vitals:  Filed Vitals:   11/12/13 1640  BP: 91/42  Pulse:   Temp:   Resp: 20    Post-op Vital Signs: stable   Complications: No apparent anesthesia complications. Atrial fibrillation with RVR now on diltiazem drip. She has continued to deny complaints. She has been seen and evaluated by Dr. Malachi Bonds (hospitalist service).

## 2013-11-12 NOTE — Progress Notes (Signed)
Patient ID: Gina Wilkinson, female   DOB: 25-Jan-1920, 79 y.o.   MRN: [id number] Pharmacy called, recommend d/c ancef due to PCN allergy with anaphylatic reaction, will change to vancomycin.

## 2013-11-12 NOTE — Progress Notes (Signed)
In afib with rvr on admission to PACU. Dr. Council Mechanic at bedside. Orders given. Diltiazem 1mg  IV at 1524. Diltiazem 1mg  IV  repeated [id number]. HR down to 119. Awake and alert. Responding appropriately to questions. 1533 Diltiazem 1mg  IV repeated per Dr. Raliegh Ip request. HR at 129.

## 2013-11-12 NOTE — Op Note (Signed)
Date of surgery: 11/12/2013  Preoperative diagnosis: Left hip periprosthetic fracture  Postoperative diagnosis: Same  Procedure: Open treatment of femoral fracture with plate and cerclage wires.  Surgeon: Glee Arvin, M.D.  Implants: Zimmer 12 hole curved plate  Anesthesia: Gen.  Estimated blood loss: 300 cc  Complications: None  Condition to PACU: Stable  Indications for procedure: Gina Wilkinson is a 79 year old female who sustained a periprosthetic fracture earlier this week. Recommendations were to undergo operative treatment. However given the patient's heart status her surgery was delayed until today. The risks, benefits, and alternatives to surgery were discussed with the patient and her daughters and they elected to go forward with the surgery. They understood that the patient's risk of cardiac complications intraoperatively and perioperatively was high.  Due to the a high risk nature of the surgery all were in agreement to just fix the fracture and not assess the stability of the implant which revision of could result in higher amounts of blood loss and higher risk of complications.  Description of procedure: The patient was identified in the operative holding area. The operative site and procedure were confirmed with the patient and marked by the surgeon. She was brought back to the operating room. General anesthesia was induced. She was placed right lateral decubitus on a beanbag on a flat Jackson table.  The left lower extremity was prepped and draped in standard sterile fashion. Pre-incisional antibiotics were given. A timeout was performed. We used fluoroscopy to localize the area of the fracture. We then made a extensile incision laterally over the femur. Blunt dissection was taken down to the level of the IT band. The IT band was sharply incised in line with the incision. The vastus lateralis muscle was exposed. The muscle was bluntly split in line with the incision. Bleeders were  coagulated using an irrigating bipolar. The muscle was sharply elevated off of the femur. The fracture site was exposed. The fracture was reduced using a large tenaculum clamp. Adequate reduction was confirmed on orthogonal views on x-ray. We then sized a 12 hole plate on the lateral aspect of the femur. A nonlocking screw was placed distal to the fracture through the plate in the femur. We then placed 2 cables around the plate and the bone. The cables were provisionally tightened. We then placed 3 more nonlocking bicortical screws distal to the fracture site through the plate. We then placed one more cable around the proximal femur. The cables were then tightened down and snipped.  We then irrigated the wound thoroughly using liters of normal saline and cystoscopy tubing. A medium Hemovac was placed deep to the IT band. The IT band was closed using a running #1 PDS suture. The subcutaneous fat was closed with a running 0 Vicryl. The subcutaneous layer was closed with interrupted 2-0 Vicryl. The skin was approximated using staples. Dermabond was placed on the incision. A sterile dressing was placed on the incision. The patient was transported to the PACU in condition.  Disposition: The patient will be admitted back to the hospital service. She will be admitted to the intermediate care for overnight observation. She will be touchdown weightbearing to the left lower extremity. We will follow the drain output and discontinue this when able. She will be on aspirin twice a day for DVT chemoprophylaxis.  Gina Reel, MD [facility] [phone] 3:24 PM

## 2013-11-12 NOTE — Progress Notes (Signed)
[facility] PROGRESS NOTE  KENDELLE SCHWEERS ZOX:[id number] DOB: 05-09-1920 DOA: 11/09/2013  PCP: Cassell Clement, MD  Brief HPI: 79 year old female presented after a mechanical fall at her home. X-ray of the left hip in ED showed a left periprosthetic proximal femur fracture. Patient also complained of increasing lower extremity edema and shortness of breath for approximately 6 days prior to this admission. Echocardiogram was obtained on 11/05/2013. It showed E. of 60-65%, mild to moderate aortic stenosis, and moderate to severe pulmonary hypertension. In addition, the patient has been on cephalexin for approximately 5 days because of the chronic right lower extremity pretibial ulceration that has not been healing well. There was concern for infection.   Past medical history:  Past Medical History  Diagnosis Date  . MDS (myelodysplastic syndrome) 10/05/2011  . Pneumonia   . Thyroid disease   . Hypertension   . Atrial fibrillation dx Mar. 2010    Consultants: Cardiology  Procedures:  ORIF planned for 12/18  Antibiotics: Vancomycin 12/15-->  Subjective: Patient feels well. Has pain in left hip with movement but none otherwise. Denies shortness of breath. Slightly confused this morning. Daughter at bedside.  Objective: Vital Signs  Filed Vitals:   11/11/13 0638 11/11/13 1325 11/11/13 2013 11/12/13 0411  BP: 115/59 90/41 107/50 106/46  Pulse: 78 90 105 88  Temp: 98.1 F (36.7 C) 98.3 F (36.8 C) 98.2 F (36.8 C) 98.2 F (36.8 C)  TempSrc: Oral Oral Oral Oral  Resp: 22 20 20 20   Height:      Weight: 70.5 kg (155 lb 6.8 oz)   70.3 kg (154 lb 15.7 oz)  SpO2: 97% 96% 94% 97%    Intake/Output Summary (Last 24 hours) at 11/12/13 0746 Last data filed at 11/12/13 0414  Gross per 24 hour  Intake    480 ml  Output   1350 ml  Net   -870 ml   Filed Weights   11/10/13 0605 11/11/13 0638 11/12/13 0411  Weight: 71.26 kg (157 lb 1.6 oz) 70.5 kg (155 lb 6.8 oz) 70.3 kg  (154 lb 15.7 oz)    General appearance: alert, cooperative, appears stated age and no distress Resp: Decreased air entry at bases with few crackles. No wheezing. Cardio: S1S2 irregularly irregular. Systolic murmur over precordium. No S3S4. No rubs murmurs or bruits. Minimal pedal edema noted bil LE. GI: soft, non-tender; bowel sounds normal; no masses,  no organomegaly Extremities: Some venous stasis and edema noted in Bil LE. Some erythema noted as well. Left LE is rotated. Pulses: Poorly palpable. Skin: erythema noted bil LE with dressing noted as well. Neurologic: Alert and oriented x 3. No focal deficits. Gait could not be assessed.  Lab Results:  Basic Metabolic Panel:  Recent Labs Lab 11/09/13 1423 11/10/13 0407 11/11/13 0354 11/12/13 0400  NA 137 136 135 134*  K 3.8 3.9 4.0 4.0  CL 95* 94* 95* 91*  CO2 34* 35* 35* 36*  GLUCOSE 117* 100* 135* 100*  BUN 20 22 31* 28*  CREATININE 1.26* 1.43* 1.71* 1.49*  CALCIUM 9.3 8.7 8.9 8.5  MG  --   --  2.0  --    CBC:  Recent Labs Lab 11/09/13 1423 11/09/13 1722 11/11/13 0354 11/12/13 0400  WBC 7.9 8.6 7.4 7.0  NEUTROABS 6.4 7.1  --   --   HGB 10.2* 10.2* 8.6* 8.5*  HCT 32.0* 31.6* 26.9* 26.7*  MCV 95.8 95.2 96.1 96.0  PLT 240 224 184 190   Cardiac Enzymes:  Recent Labs Lab 11/09/13 1722 11/09/13 2258 11/10/13 0407  TROPONINI <0.30 <0.30 <0.30   BNP (last 3 results)  Recent Labs  11/09/13 1721  PROBNP 5281.0*    Studies/Results: No results found.  Medications:  Scheduled: . aspirin  81 mg Oral Daily  . levothyroxine  125 mcg Oral QAC breakfast  . sodium chloride  3 mL Intravenous Q12H  . vancomycin  1,000 mg Intravenous Q48H   Continuous:  ZOX:WRUEAV chloride, acetaminophen, HYDROcodone-acetaminophen, morphine injection, ondansetron (ZOFRAN) IV, sodium chloride  Assessment/Plan:  Active Problems:   HYPOTHYROIDISM   MDS (myelodysplastic syndrome)   Hip fracture   Acute diastolic CHF  (congestive heart failure)   Chronic atrial fibrillation   RBBB   Cellulitis and abscess of leg    Acute on Chronic diastolic CHF  Patient seems better. She has diuresed about 1.5 liters. She is asymptomatic. Creatinine is better today. BP running on lower side. Discussed with cardiology. Lasix is held for now. ECHO report reviewed. Holding BB for low BP. Elevated PA pressures noted on ECHO.  Elevated Creatinine Likely has chronic renal insufficiency. Rise in creatinine was likely due to Lasix. Now better. Monitor creat and UO closely.  Chronic atrial fibrillation  Patient is on aspirin. Not a warfarin candidate due to myelodysplastic syndrome. Currently rate controlled. TSH is 1.37.   Cellulitis of bilateral lower extremities.  Although difficult to discern in the setting of venous stasis dermatitis. Patient's daughter had stated that the erythema in her legs were worse than usual. Patient was started on Vancomycin. No significant improvement in erythema. Continue to monitor. Wound care RN consulted. Will change to oral Doxy at discharge.  Right proximal femur fracture  Patient needs surgical intervention. Discussed with cardiology and their note reviewed. Patient is as stable and compensated as she will get. She may proceed to surgery later today or tomorrow. She will remain high risk for perioperative cardiac complications. Suggest using BB as BP tolerates if HR increases.  Myelodysplastic syndrome  Patient usually follows Dr. Cyndie Chime. Getting intermittent Aranesp. Drop in Hgb noted. Probably due to hip fracture. Monitor. No other overt bleeding noted.  Hypothyroidism Continue Synthroid. TSH is normal.  Code Status: DNR  DVT Prophylaxis: Heparin on hold till after surgery per Ortho.   Family Communication: Daughter at bedside.   Disposition Plan: Will likely need SNF. CSW is following.    LOS: 3 days   [facility]  [facility] Pager [phone] 11/12/2013, 7:46  AM  If 8PM-8AM, please contact night-coverage at [email or web address], password [facility]

## 2013-11-12 NOTE — Treatment Plan (Signed)
   Subjective:  No events  Objective:   VITALS:   Filed Vitals:   11/11/13 0638 11/11/13 1325 11/11/13 2013 11/12/13 0411  BP: 115/59 90/41 107/50 106/46  Pulse: 78 90 105 88  Temp: 98.1 F (36.7 C) 98.3 F (36.8 C) 98.2 F (36.8 C) 98.2 F (36.8 C)  TempSrc: Oral Oral Oral Oral  Resp: 22 20 20 20   Height:      Weight: 70.5 kg (155 lb 6.8 oz)   70.3 kg (154 lb 15.7 oz)  SpO2: 97% 96% 94% 97%    Neurologically intact Neurovascular intact Sensation intact distally Intact pulses distally Dorsiflexion/Plantar flexion intact   Lab Results  Component Value Date   WBC 7.0 11/12/2013   HGB 8.5* 11/12/2013   HCT 26.7* 11/12/2013   MCV 96.0 11/12/2013   PLT 190 11/12/2013     Assessment/Plan: Day of Surgery   Problem List Items Addressed This Visit     Cardiovascular and Mediastinum   ATRIAL FIBRILLATION WITH RAPID VENTRICULAR RESPONSE   Relevant Medications      furosemide (LASIX) 40 MG tablet      lisinopril (PRINIVIL,ZESTRIL) 10 MG tablet      aspirin chewable tablet 81 mg      heparin injection 5,000 Units (Completed)   Acute diastolic CHF (congestive heart failure)   Chronic atrial fibrillation     Musculoskeletal and Integument   Hip fracture     Other   Cellulitis and abscess of leg    Other Visit Diagnoses   Closed left hip fracture, initial encounter    -  Primary    Elevated serum creatinine        Cellulitis of lower leg           - plan for surgery today - family and patient understand she is high risk   Cheral Almas 11/12/2013, 8:17 AM [phone]

## 2013-11-13 ENCOUNTER — Encounter (HOSPITAL_COMMUNITY): Payer: Self-pay | Admitting: Orthopaedic Surgery

## 2013-11-13 ENCOUNTER — Inpatient Hospital Stay (HOSPITAL_COMMUNITY): Payer: Medicare Other

## 2013-11-13 DIAGNOSIS — I4510 Unspecified right bundle-branch block: Secondary | ICD-10-CM | POA: Diagnosis not present

## 2013-11-13 DIAGNOSIS — I4891 Unspecified atrial fibrillation: Secondary | ICD-10-CM | POA: Diagnosis not present

## 2013-11-13 DIAGNOSIS — L02419 Cutaneous abscess of limb, unspecified: Secondary | ICD-10-CM | POA: Diagnosis not present

## 2013-11-13 DIAGNOSIS — I5031 Acute diastolic (congestive) heart failure: Secondary | ICD-10-CM | POA: Diagnosis not present

## 2013-11-13 DIAGNOSIS — S72009A Fracture of unspecified part of neck of unspecified femur, initial encounter for closed fracture: Secondary | ICD-10-CM | POA: Diagnosis not present

## 2013-11-13 DIAGNOSIS — I509 Heart failure, unspecified: Secondary | ICD-10-CM | POA: Diagnosis not present

## 2013-11-13 DIAGNOSIS — J9601 Acute respiratory failure with hypoxia: Secondary | ICD-10-CM

## 2013-11-13 DIAGNOSIS — J90 Pleural effusion, not elsewhere classified: Secondary | ICD-10-CM | POA: Diagnosis not present

## 2013-11-13 LAB — CBC
HCT: 28.3 % — ABNORMAL LOW (ref 36.0–46.0)
Hemoglobin: 9 g/dL — ABNORMAL LOW (ref 12.0–15.0)
MCH: 30.5 pg (ref 26.0–34.0)
MCHC: 31.8 g/dL (ref 30.0–36.0)
MCV: 95.9 fL (ref 78.0–100.0)
Platelets: 185 K/uL (ref 150–400)
RBC: 2.95 MIL/uL — ABNORMAL LOW (ref 3.87–5.11)
RDW: 18.6 % — ABNORMAL HIGH (ref 11.5–15.5)
WBC: 5.9 K/uL (ref 4.0–10.5)

## 2013-11-13 LAB — BASIC METABOLIC PANEL WITH GFR
BUN: 28 mg/dL — ABNORMAL HIGH (ref 6–23)
CO2: 32 meq/L (ref 19–32)
Calcium: 8.4 mg/dL (ref 8.4–10.5)
Chloride: 96 meq/L (ref 96–112)
Creatinine, Ser: 1.32 mg/dL — ABNORMAL HIGH (ref 0.50–1.10)
GFR calc Af Amer: 40 mL/min — ABNORMAL LOW (ref >90)
GFR calc non Af Amer: 34 mL/min — ABNORMAL LOW (ref >90)
Glucose, Bld: 143 mg/dL — ABNORMAL HIGH (ref 70–99)
Potassium: 4.9 meq/L (ref 3.5–5.1)
Sodium: 135 meq/L (ref 135–145)

## 2013-11-13 LAB — TYPE AND SCREEN
Antibody Screen: POSITIVE
DAT, IgG: NEGATIVE
Unit division: 0

## 2013-11-13 LAB — TROPONIN I: Troponin I: <0.3 ng/mL (ref <0.30)

## 2013-11-13 LAB — VANCOMYCIN, TROUGH: Vancomycin Tr: 6.7 ug/mL — ABNORMAL LOW (ref 10.0–20.0)

## 2013-11-13 LAB — MAGNESIUM: Magnesium: 1.9 mg/dL (ref 1.5–2.5)

## 2013-11-13 MED ORDER — AMIODARONE HCL IN DEXTROSE 360-4.14 MG/200ML-% IV SOLN
INTRAVENOUS | Status: AC
  Administered 2013-11-13: 30 mg/h
  Filled 2013-11-13: qty 200

## 2013-11-13 MED ORDER — METOPROLOL TARTRATE 25 MG PO TABS
25.0000 mg | ORAL_TABLET | Freq: Three times a day (TID) | ORAL | Status: DC
  Administered 2013-11-13 – 2013-11-15 (×3): 25 mg via ORAL
  Filled 2013-11-13 (×10): qty 1

## 2013-11-13 MED ORDER — HYDROCODONE-ACETAMINOPHEN 5-325 MG PO TABS: 1.0000 | ORAL_TABLET | Freq: Four times a day (QID) | ORAL | Status: DC | PRN

## 2013-11-13 MED ORDER — ASPIRIN EC 325 MG PO TBEC: 325.0000 mg | DELAYED_RELEASE_TABLET | Freq: Two times a day (BID) | ORAL | Status: DC

## 2013-11-13 NOTE — Progress Notes (Addendum)
SUBJECTIVE: No complaints. Does not remember surgery yesterday.   BP 103/46  Pulse 71  Temp(Src) 98.6 F (37 C) (Oral)  Resp 20  Ht 5\' 6"  (1.676 m)  Wt 159 lb 2.8 oz (72.2 kg)  BMI 25.70 kg/m2  SpO2 97%  Intake/Output Summary (Last 24 hours) at 11/13/13 1610 Last data filed at 11/13/13 0600  Gross per 24 hour  Intake 2778.3 ml  Output   1765 ml  Net 1013.3 ml    PHYSICAL EXAM General: Well developed, well nourished, in no acute distress. Alert and oriented x 3.  Psych:  Good affect, responds appropriately Neck: No JVD. No masses noted.  Lungs: Clear bilaterally with no wheezes or rhonci noted.  Heart: Irreg irreg with systolic murmur noted. Abdomen: Bowel sounds are present. Soft, non-tender.  Extremities: No lower extremity edema.   LABS: Basic Metabolic Panel:  Recent Labs  96/04/54 0354  11/12/13 1628 11/13/13 0358  NA 135  < > 137 135  K 4.0  < > 4.2 4.9  CL 95*  < > 97 96  CO2 35*  < > 35* 32  GLUCOSE 135*  < > 108* 143*  BUN 31*  < > 24* 28*  CREATININE 1.71*  < > 1.31* 1.32*  CALCIUM 8.9  < > 8.6 8.4  MG 2.0  --   --   --   < > = values in this interval not displayed. CBC:  Recent Labs  11/12/13 1628 11/13/13 0358  WBC 10.3 5.9  HGB 10.3* 9.0*  HCT 32.0* 28.3*  MCV 96.1 95.9  PLT 209 185   Cardiac Enzymes:  Recent Labs  11/12/13 1628 11/12/13 2032 11/13/13 0358  TROPONINI <0.30 <0.30 <0.30   Current Meds: . antiseptic oral rinse  15 mL Mouth Rinse q12n4p  . aspirin EC  325 mg Oral BID  . chlorhexidine  15 mL Mouth Rinse BID  . levothyroxine  125 mcg Oral QAC breakfast  . senna  1 tablet Oral BID  . sodium chloride  3 mL Intravenous Q12H  . vancomycin  1,000 mg Intravenous Q48H     ASSESSMENT AND PLAN: 79 yo female with history of diastolic CHF, MDS and permanent atrial fibrillation admitted after mechanical fall resulting in left proximal femur fracture and needs surgery. She was noted to be volume overloaded on exam when  admitted. Normal EF and moderate-severe pulmonary HTN on echo. Diuresed well with several doses of Lasix. Now s/p left femur repair. Pt developed atrial fibrillation with RVR post surgery.   1. Acute diastolic CHF: Preserved EF on echo but moderate to severe pulmonary HTN, moderate LVH. There is no significant volume overload on exam today. Lasix is on hold.    2. Chronic persistent atrial fibrillation: She has been off metoprolol due to low blood pressure during hospitalization. She is on Toprol XL 100 mg daily at home. This has been held since admission. Developed RVR post-op on 11/12/13. Currently rate controlled on amiodarone drip. Will restart beta blocker today and turn off amiodarone drip in two hours. Will start metoprolol 25 mg po TID and hopefully can get her back to her home dosage of Toprol before discharge. Not anticoagulated due to fall risk and h/o MDS. No changes today.   3. Aortic valve stenosis: Mild to moderate by echo   Follow up with Dr. Sharrell Ku in our [facility] office in 2-3 weeks.         Ota Ebersole  12/19/20147:07 AM

## 2013-11-13 NOTE — Progress Notes (Signed)
Notified by monitor tech that patient is having lots of PVCs, patient in bed resting, denies any distress, notified Dr. Malachi Bonds stated she will put some orders in.

## 2013-11-13 NOTE — Progress Notes (Signed)
CARE MANAGEMENT NOTE 11/13/2013  Patient:  Gina Wilkinson, Gina Wilkinson   Account Number:  [id number]  Date Initiated:  11/10/2013  Documentation initiated by:  DAVIS,RHONDA  Subjective/Objective Assessment:   pt sustained fx hip s/p fall.  chf-being stablized for surg due to hypotension     Action/Plan:   surgical intervention when chf stablized   Anticipated DC Date:  11/13/2013   Anticipated DC Plan:  SKILLED NURSING FACILITY  In-house referral  Clinical Social Worker      DC Planning Services  NA      [facility] Choice  NA   Choice offered to / List presented to:  NA   DME arranged  NA      DME agency  NA     HH arranged  NA      HH agency  NA   Status of service:  In process, will continue to follow Medicare Important Message given?  NA - LOS <3 / Initial given by admissions (If response is "NO", the following Medicare IM given date fields will be blank) Date Medicare IM given:   Date Additional Medicare IM given:    Discharge Disposition:    Per UR Regulation:  Reviewed for med. necessity/level of care/duration of stay  If discussed at Long Length of Stay Meetings, dates discussed:    Comments:  [id number]/Rhonda Stark Jock, BSN, [place] [phone] Chart Reviewed for discharge and hospital needs. Discharge needs at time of review:  None present will follow for needs. Review of patient progress due on [id number].   Rhonda Davis,RN,BSN,CCM

## 2013-11-13 NOTE — Progress Notes (Signed)
   CARE MANAGEMENT NOTE 11/13/2013  Patient:  Gina Wilkinson, Gina Wilkinson   Account Number:  [id number]  Date Initiated:  11/10/2013  Documentation initiated by:  DAVIS,RHONDA  Subjective/Objective Assessment:   pt sustained fx hip s/p fall.  chf-being stablized for surg due to hypotension     Action/Plan:   surgical intervention when chf stablized   Anticipated DC Date:  11/16/2013   Anticipated DC Plan:  SKILLED NURSING FACILITY  In-house referral  Clinical Social Worker      DC Planning Services  CM consult      [facility] Choice  NA   Choice offered to / List presented to:  NA   DME arranged  NA      DME agency  NA     HH arranged  NA      HH agency  NA   Status of service:  In process, will continue to follow Medicare Important Message given?  NA - LOS <3 / Initial given by admissions (If response is "NO", the following Medicare IM given date fields will be blank) Date Medicare IM given:   Date Additional Medicare IM given:    Discharge Disposition:    Per UR Regulation:  Reviewed for med. necessity/level of care/duration of stay  If discussed at Long Length of Stay Meetings, dates discussed:    Comments:  11/13/13 Kyng Matlock RN,BSN NCM [phone] TRANSFER FROM SDU.[facility] PLAN SNF.  [id number] Earlene Plater, RN, BSN, [place] [phone] Chart Reviewed for discharge and hospital needs. Discharge needs at time of review:  None present will follow for needs. Review of patient progress due on [id number].   Rhonda Davis,RN,BSN,CCM

## 2013-11-13 NOTE — Progress Notes (Signed)
ANTIBIOTIC CONSULT NOTE   Pharmacy Consult for Vancomycin Indication: cellulitis  Allergies  Allergen Reactions  . Levaquin [Levofloxacin] Anaphylaxis  . Penicillins Anaphylaxis  . Sulfonamide Derivatives Anaphylaxis    Patient Measurements: Height: 5\' 6"  (167.6 cm) Weight: 159 lb 2.8 oz (72.2 kg) IBW/kg (Calculated) : 59.3   Vital Signs: Temp: 98.6 F (37 C) (12/19 0400) Temp src: Oral (12/19 0400) BP: 105/52 mmHg (12/19 0800)  Labs:  Recent Labs  11/12/13 0400 11/12/13 1628 11/13/13 0358  WBC 7.0 10.3 5.9  HGB 8.5* 10.3* 9.0*  PLT 190 209 185  CREATININE 1.49* 1.31* 1.32*   Estimated Creatinine Clearance: 28.3 ml/min (by C-G formula based on Cr of 1.32).  Recent Labs  11/13/13 0358  VANCOTROUGH 6.7*      Assessment: 60 yoF admitted with femur fracture following fall at home today.  Patient has been on cephalexin for 5 days PTA for chronic right lower extremity ulceration concerning for infection.  Patient daughter reports that the erythema on patient's legs are worse than usual.  MD discontinuing cephalexin and starting vancomycin for treatment of cellulitis.  12/15 >> Vanc >>   Tmax: AF  WBC: WNL  Renal: SCr 1.32, slight improvement from admission. CrCl~28 ml/min  12/15 blood cx: no growth to date  Day #5 Vancomycin 1g IV q48h.  Vancomycin trough drawn prior to steady state to evaluate whether patient may be accumulating too much vancomycin due to advanced age and renal insufficiency.  Vancomycin trough 6.7.  Received third vancomycin dose (1g) following trough level.  Anticipate that patient will reach goal trough level at steady state.  Goal of Therapy:  Vancomycin trough level 10-15 mcg/ml  Plan:   Continue Vancomycin 1g IV q48h.    F/u SCr for dose adjustments.  F/u plan for abx at discharge.  Clance Boll, PharmD, BCPS Pager: [phone] 11/13/2013 8:53 AM

## 2013-11-13 NOTE — Progress Notes (Signed)
CSW spoke with patient's daughter, [name] & confirmed that they are accepting their bed offer at [facility] when medically ready.   CSW will check back Monday.   Clinical Social Work Department CLINICAL SOCIAL WORK PLACEMENT NOTE 11/13/2013  Patient:  Gina Wilkinson, Gina Wilkinson  Account Number:  [id number] Admit date:  11/09/2013  Clinical Social Worker:  Orpah Greek  Date/time:  11/11/2013 03:38 PM  Clinical Social Work is seeking post-discharge placement for this patient at the following level of care:   SKILLED NURSING   (*CSW will update this form in Epic as items are completed)   11/11/2013  Patient/family provided with [facility] Department of Clinical Social Work's list of facilities offering this level of care within the geographic area requested by the patient (or if unable, by the patient's family).  11/11/2013  Patient/family informed of their freedom to choose among providers that offer the needed level of care, that participate in Medicare, Medicaid or managed care program needed by the patient, have an available bed and are willing to accept the patient.  11/11/2013  Patient/family informed of [facility]' ownership interest in [facility], as well as of the fact that they are under no obligation to receive care at this facility.  PASARR submitted to EDS on 11/11/2013 PASARR number received from EDS on 11/11/2013  FL2 transmitted to all facilities in geographic area requested by pt/family on  11/11/2013 FL2 transmitted to all facilities within larger geographic area on   Patient informed that his/her managed care company has contracts with or will negotiate with  certain facilities, including the following:     Patient/family informed of bed offers received:  11/11/2013 Patient chooses bed at [facility] Physician recommends and patient chooses bed at    Patient to be transferred [facility]   Patient to  be transferred to facility by   The following physician request were entered in Epic:   Additional Comments:   Unice Bailey, LCSW [facility] Clinical Social Worker cell #: [phone]

## 2013-11-13 NOTE — Progress Notes (Signed)
   Subjective:  Patient reports pain as mild.  No orthopedic events o/n.  Objective:   VITALS:   Filed Vitals:   11/13/13 0500 11/13/13 0600 11/13/13 0700 11/13/13 0735  BP: 102/46 103/46 101/38   Pulse:      Temp:      TempSrc:      Resp: 19 20 19 18   Height:      Weight: 72.2 kg (159 lb 2.8 oz)     SpO2: 99% 97% 97% 97%    Neurologically intact Neurovascular intact Sensation intact distally Intact pulses distally Dorsiflexion/Plantar flexion intact Incision: dressing C/D/I and no drainage No cellulitis present Compartment soft HVAC in place   Lab Results  Component Value Date   WBC 5.9 11/13/2013   HGB 9.0* 11/13/2013   HCT 28.3* 11/13/2013   MCV 95.9 11/13/2013   PLT 185 11/13/2013     Assessment/Plan: 1 Day Post-Op   Problem List Items Addressed This Visit     Cardiovascular and Mediastinum   ATRIAL FIBRILLATION WITH RAPID VENTRICULAR RESPONSE   Relevant Medications      furosemide (LASIX) 40 MG tablet      lisinopril (PRINIVIL,ZESTRIL) 10 MG tablet      heparin injection 5,000 Units (Completed)      aspirin EC tablet 325 mg      diltiazem (CARDIZEM) 1 mg/mL load via infusion 3 mg (Completed)      amiodarone (NEXTERONE) 1.8 mg/mL load via infusion 150 mg (Completed)      metoprolol tartrate (LOPRESSOR) tablet 25 mg      amiodarone (NEXTERONE PREMIX) 360 mg/200 mL dextrose 360 MG/200ML IV infusion (Completed)   Acute diastolic CHF (congestive heart failure)   Chronic atrial fibrillation     Musculoskeletal and Integument   Hip fracture     Other   Cellulitis and abscess of leg    Other Visit Diagnoses   Closed left hip fracture, initial encounter    -  Primary    Elevated serum creatinine        Cellulitis of lower leg           Continue HVAC - monitor output Up with PT/OT when medically stable DVT ppx - SCDs, ambulation, asa BID TDWB left and lower extremity Medical mgmt per hospitalist - appreciate their assistance Pain  control    Cheral Almas 11/13/2013, 8:06 AM [phone]

## 2013-11-13 NOTE — Progress Notes (Signed)
Pt transferred from ICU, O2 stat 67% with initial measurement on floor. Pt instructed to take deep breaths, O2 stat came up to 77%. [place] 2L applied and o2 came up to 90%. Pt instructed to cough and deep breath and O2 came up to 94% and maintaining.  IS performed with pt able to reach 750.. Will continue to monitor. Gina Wilkinson A

## 2013-11-13 NOTE — Evaluation (Addendum)
Physical Therapy Evaluation Patient Details Name: Gina Wilkinson MRN: [id number] DOB: 09/10/1920 Today's Date: 11/13/2013 Time: 8469-6295 PT Time Calculation (min): 31 min  PT Assessment / Plan / Recommendation History of Present Illness  s/p ORIF L periprosthetic fx; Postoperatively, the patient went into A. fib and RVR with heart rate in the 150s. Anesthesia was at the bedside and gave her diltiazem 1 mg IV x3. The patient remained asymptomatic, her blood pressure remained stable in the low 100 systolic.   Clinical Impression  Pt will benefit from PT to address deficits  Below; Pt will need post acute rehab at SNF level    PT Assessment  Patient needs continued PT services    Follow Up Recommendations  SNF    Does the patient have the potential to tolerate intense rehabilitation      Barriers to Discharge        Equipment Recommendations  None recommended by PT    Recommendations for Other Services     Frequency Min 3X/week    Precautions / Restrictions Precautions Precautions: Fall Restrictions Weight Bearing Restrictions: Yes LLE Weight Bearing: Touchdown weight bearing   Pertinent Vitals/Pain  Pt without c/o pain but pt dtr requesting meds, RN gave meds, PT limited activity due to decr diastolic BP and dtr being extremely concerned about pain/meds; BP 115/37 RR 22-25 sats 96-99% HR 63-82     Mobility  Bed Mobility Bed Mobility: Supine to Sit;Sitting - Scoot to [place] of Bed;Sit to Supine;Scooting to [facility] Supine to Sit: 1: +2 Total assist Supine to Sit: Patient Percentage: 30% Sitting - Scoot to Edge of Bed: 1: +1 Total assist Sit to Supine: 1: +2 Total assist Sit to Supine: Patient Percentage: 10% Scooting to HOB: 1: +2 Total assist Scooting to [facility]: Patient Percentage: 0% Details for Bed Mobility Assistance: cues for technique, +2 for UB, LB,lines and safety Transfers Transfers: Not assessed Ambulation/Gait Ambulation/Gait Assistance: Not tested (comment)     Exercises General Exercises - Lower Extremity Ankle Circles/Pumps: AROM;5 reps;Both Heel Slides: AAROM;Left;5 reps   PT Diagnosis: Difficulty walking;Generalized weakness  PT Problem List: Decreased strength;Decreased range of motion;Decreased activity tolerance;Decreased balance;Decreased mobility;Decreased knowledge of precautions;Decreased knowledge of use of DME PT Treatment Interventions: DME instruction;Gait training;Functional mobility training;Therapeutic activities;Therapeutic exercise;Patient/family education     PT Goals(Current goals can be found in the care plan section) Acute Rehab PT Goals Patient Stated Goal: return to I PT Goal Formulation: With patient Time For Goal Achievement: 11/20/13 Potential to Achieve Goals: Good  Visit Information  Last PT Received On: 11/13/13 Assistance Needed: +2 History of Present Illness: s/p ORIF L periprosthetic fx; Postoperatively, the patient went into A. fib and RVR with heart rate in the 150s. Anesthesia was at the bedside and gave her diltiazem 1 mg IV x3. The patient remained asymptomatic, her blood pressure remained stable in the low 100 systolic.        Prior Functioning  Home Living Family/patient expects to be discharged to:: Skilled nursing facility Living Arrangements: Alone Additional Comments: at [facility] Prior Function Level of Independence: Independent Comments: RN every am, to check BP, meds; family providing meals Communication Communication: No difficulties    Cognition  Cognition Arousal/Alertness: Awake/alert Behavior During Therapy: WFL for tasks assessed/performed Overall Cognitive Status: Within Functional Limits for tasks assessed    Extremity/Trunk Assessment Upper Extremity Assessment Upper Extremity Assessment: Defer to OT evaluation;LUE deficits/detail;RUE deficits/detail RUE Deficits / Details: grossly 3+to 4/5 LUE Deficits / Details: grossly 3 to 3+/5 Lower Extremity Assessment  Lower  Extremity Assessment: RLE deficits/detail;LLE deficits/detail RLE Deficits / Details: difficulty with AROM hip and knee, grossly 3+/5 after movemetn intiated LLE: Unable to fully assess due to pain   Balance Static Sitting Balance Static Sitting - Balance Support: Bilateral upper extremity supported;No upper extremity supported;Feet supported Static Sitting - Level of Assistance: 4: Min assist;5: Stand by assistance Static Sitting - Comment/# of Minutes: cues for trunk extension; pt sat EOB x 10 min, deep breathing encouraged  End of Session PT - End of Session Activity Tolerance: Other (comment);Treatment limited secondary to medical complications (Comment) (just had pain meds, decr diastolic BP;pt in NAD) Patient left: in bed;with call bell/phone within reach;with family/visitor present Nurse Communication: Mobility status;Patient requests pain meds (meds given prior to mobility)  GP     [facility] 11/13/2013, 10:24 AM

## 2013-11-13 NOTE — Progress Notes (Signed)
OT Cancellation Note  Patient Details Name: Gina Wilkinson MRN: [id number] DOB: 1920/09/21   Cancelled Treatment:    Reason Eval/Treat Not Completed: Other (comment) Defer OT eval to SNF.  Lennox Laity [phone] 11/13/2013, 1:09 PM

## 2013-11-13 NOTE — Progress Notes (Signed)
[facility] PROGRESS NOTE  Gina Wilkinson ION:[id number] DOB: 08-14-1920 DOA: 11/09/2013 PCP: Cassell Clement, MD  Assessment/Plan  Acute hypoxic respiratory failure due to acute on Chronic diastolic CHF with pulmonary edema on chest x-ray -  Restart Lasix as blood pressure tolerates -  Cardiology following -  Wean oxygen as tolerated -  Monitor clinically for signs of pneumonia  Atrial fibrillation with RVR -  Continue aspirin. Not a warfarin candidate due to myelodysplastic syndrome.  -  Heart rate trended down with amiodarone infusion overnight  -  Cardiology transitioning to beta blocker by mouth today  -  If heart rate remains stable, at could transfer from step down to a telemetry unit later today  -  TSH is 1.37  Chronic kidney disease stage III, BUN and creatinine stable  Cellulitis of bilateral lower extremities. Resolving. -  Continue vancomycin day # 5 -  Transition to doxycycline at discharge  -  Appreciate wound care consultation   Right proximal femur fracture status post ORIF on 12/18 -  Appreciate orthopedics assistance -  If heart rate remains stable this afternoon, may start physical and outpatient therapy -  DVT prophylaxis per orthopedics -  Anticipate patient will likely need rehabilitation after hospitalization  Myelodysplastic syndrome  Patient usually follows Dr. Cyndie Chime. Getting intermittent Aranesp. Drop in Hgb noted. Probably due to hip fracture and blood loss during surgery. Monitor. No other overt bleeding noted.   Hypothyroidism , stable, continue Synthroid  Diet:  Healthy heart Access:  PIV IVF:  Off Proph:  Aspirin  Code Status: DO NOT RESUSCITATE Family Communication: Spoke to patient and her daughter Disposition Plan: Pending improvement in heart rate, physical therapy assessment   Consultants:  Cardiology Dr. Clifton James  Orthopedics Dr. Roda Shutters   Procedures:  XR femur  CXR  ECHO  Antibiotics:  Vancomycin 12/15 >>    HPI/Subjective:  Denies chest pain, shortness of breath, nausea, vomiting, dysuria, lightheadedness  Objective: Filed Vitals:   11/13/13 0500 11/13/13 0600 11/13/13 0700 11/13/13 0735  BP: 102/46 103/46 101/38   Pulse:      Temp:      TempSrc:      Resp: 19 20 19 18   Height:      Weight: 72.2 kg (159 lb 2.8 oz)     SpO2: 99% 97% 97% 97%    Intake/Output Summary (Last 24 hours) at 11/13/13 0821 Last data filed at 11/13/13 0600  Gross per 24 hour  Intake 2778.3 ml  Output   1765 ml  Net 1013.3 ml   Filed Weights   11/12/13 0411 11/12/13 1700 11/13/13 0500  Weight: 70.3 kg (154 lb 15.7 oz) 69.9 kg (154 lb 1.6 oz) 72.2 kg (159 lb 2.8 oz)    Exam:   General:  Caucasian female, No acute distress, nasal cannula in place  HEENT:  NCAT, MMM  Cardiovascular:  IRRR heart rate in the 80s, nl S1, S2, 3/6 systolic murmur at the left sternal border, 2+ pulses, warm extremities  Respiratory:  Decreased rales in the bilateral axilla, no wheezes or rhonchi, no increased WOB  Abdomen:   NABS, soft, NT/ND  MSK:   Normal tone and bulk, trace LEE, left hip dressing with trace amount of blood, drain has small amount of blood, no hematoma, minimal ecchymoses  Neuro:  Grossly intact  Data Reviewed: Basic Metabolic Panel:  Recent Labs Lab 11/10/13 0407 11/11/13 0354 11/12/13 0400 11/12/13 1628 11/13/13 0358  NA 136 135 134* 137 135  K 3.9 4.0  4.0 4.2 4.9  CL 94* 95* 91* 97 96  CO2 35* 35* 36* 35* 32  GLUCOSE 100* 135* 100* 108* 143*  BUN 22 31* 28* 24* 28*  CREATININE 1.43* 1.71* 1.49* 1.31* 1.32*  CALCIUM 8.7 8.9 8.5 8.6 8.4  MG  --  2.0  --   --   --    Liver Function Tests: No results found for this basename: AST, ALT, ALKPHOS, BILITOT, PROT, ALBUMIN,  in the last 168 hours No results found for this basename: LIPASE, AMYLASE,  in the last 168 hours No results found for this basename: AMMONIA,  in the last 168 hours CBC:  Recent Labs Lab 11/09/13 1423  11/09/13 1722 11/11/13 0354 11/12/13 0400 11/12/13 1628 11/13/13 0358  WBC 7.9 8.6 7.4 7.0 10.3 5.9  NEUTROABS 6.4 7.1  --   --   --   --   HGB 10.2* 10.2* 8.6* 8.5* 10.3* 9.0*  HCT 32.0* 31.6* 26.9* 26.7* 32.0* 28.3*  MCV 95.8 95.2 96.1 96.0 96.1 95.9  PLT 240 224 184 190 209 185   Cardiac Enzymes:  Recent Labs Lab 11/09/13 2258 11/10/13 0407 11/12/13 1628 11/12/13 2032 11/13/13 0358  TROPONINI <0.30 <0.30 <0.30 <0.30 <0.30   BNP (last 3 results)  Recent Labs  11/09/13 1721  PROBNP 5281.0*   CBG: No results found for this basename: GLUCAP,  in the last 168 hours  Recent Results (from the past 240 hour(s))  CULTURE, BLOOD (ROUTINE X 2)     Status: None   Collection Time    11/09/13  5:11 PM      Result Value Range Status   Specimen Description BLOOD LEFT ARM   Final   Special Requests BOTTLES DRAWN AEROBIC ONLY 3CC   Final   Culture  Setup Time     Final   Value: 11/10/2013 00:18     Performed at [facility]   Culture     Final   Value:        BLOOD CULTURE RECEIVED NO GROWTH TO DATE CULTURE WILL BE HELD FOR 5 DAYS BEFORE ISSUING A FINAL NEGATIVE REPORT     Performed at [facility]   Report Status PENDING   Incomplete  CULTURE, BLOOD (ROUTINE X 2)     Status: None   Collection Time    11/09/13  5:25 PM      Result Value Range Status   Specimen Description BLOOD RIGHT HAND   Final   Special Requests BOTTLES DRAWN AEROBIC ONLY 3CC   Final   Culture  Setup Time     Final   Value: 11/10/2013 00:18     Performed at [facility]   Culture     Final   Value:        BLOOD CULTURE RECEIVED NO GROWTH TO DATE CULTURE WILL BE HELD FOR 5 DAYS BEFORE ISSUING A FINAL NEGATIVE REPORT     Performed at [facility]   Report Status PENDING   Incomplete  SURGICAL PCR SCREEN     Status: None   Collection Time    11/11/13  3:39 PM      Result Value Range Status   MRSA, PCR NEGATIVE  NEGATIVE Final   Staphylococcus aureus NEGATIVE  NEGATIVE  Final   Comment:            The Xpert SA Assay (FDA     approved for NASAL specimens     in patients over 21 years of  age),     is one component of     a comprehensive surveillance     program.  Test performance has     been validated by [facility] for patients greater     than or equal to 41 year old.     It is not intended     to diagnose infection nor to     guide or monitor treatment.     Studies: Dg Femur Left  11/12/2013   CLINICAL DATA:  Fracture fixation  EXAM: LEFT FEMUR - 2 VIEW  COMPARISON:  11/09/2013  FINDINGS: Femoral fracture at the level of the distal femoral stem has been fixed with a long lateral plate and multiple screws. Three cerclage wires are present around the proximal femur. Fracture alignment in satisfactory position. Left hip hemiarthroplasty again noted.  IMPRESSION: Plate and screw fixation femur fracture.   Electronically Signed   By: Marlan Palau M.D.   On: 11/12/2013 16:02   Dg Chest Port 1 View  11/13/2013   CLINICAL DATA:  Congestive heart failure.  EXAM: PORTABLE CHEST - 1 VIEW  COMPARISON:  Chest x-ray 11/12/2013.  Chest x-ray 09/06/2012.  FINDINGS: Cardiomegaly with pulmonary vascular prominence present. Diffuse interstitial prominence with basilar and right upper lobe alveolar infiltrates present. These findings are most consistent with congestive heart failure and pulmonary edema. Underlying pneumonia cannot be excluded. Right-sided pleural effusion is present with probable fluid in the fissures. No pneumothorax. No acute osseous abnormality.  IMPRESSION: Findings most consistent with congestive heart failure and pulmonary edema. Right pleural effusion. No significant interim change noted from prior exam. Underlying pneumonia particularly in the lung bases and right upper lobe cannot be excluded.   Electronically Signed   By: Maisie Fus  Register   On: 11/13/2013 07:22   Dg Chest Port 1 View  11/12/2013   CLINICAL DATA:  Postoperative study,  patient with tachycardia and dyspnea abnormal chest exam  EXAM: PORTABLE CHEST - 1 VIEW  COMPARISON:  Preoperative study dated November 09, 2013.  FINDINGS: The lungs are well-expanded. The interstitial markings remain increased and are somewhat more conspicuous on the right peripherally. The right hemidiaphragm is obscured and there is partial obscuration of the left hemidiaphragm. The cardiopericardial silhouette is enlarged. The pulmonary vascularity is indistinct. There is a moderate-sized hiatal hernia -partially intrathoracic stomach which is not new.  IMPRESSION: The findings are consistent with congestive heart failure with pulmonary interstitial edema. Developing atelectasis or pneumonia in the right lung and at the left lung base is suspected. Continued follow-up films would be useful.   Electronically Signed   By: David  [place]   On: 11/12/2013 16:40   Dg Femur Left Port  11/12/2013   CLINICAL DATA:  Left femur fracture fixation.  EXAM: PORTABLE LEFT FEMUR - 2 VIEW  FINDINGS: There is a long plate, multiple screws and cerclage wires transfixing the periprosthetic femur fracture. No complicating features are demonstrated.  IMPRESSION: Long plate with screws and cerclage wires transfixing the periprosthetic femur fracture.   Electronically Signed   By: Loralie Champagne M.D.   On: 11/12/2013 16:39   Dg C-arm 61-120 Min-no Report  11/12/2013   CLINICAL DATA: left hip fracture   C-ARM 61-120 MINUTES  Fluoroscopy was utilized by the requesting physician.  No radiographic  interpretation.     Scheduled Meds: . antiseptic oral rinse  15 mL Mouth Rinse q12n4p  . aspirin EC  325 mg Oral BID  . chlorhexidine  15 mL Mouth Rinse BID  . levothyroxine  125 mcg Oral QAC breakfast  . metoprolol tartrate  25 mg Oral Q8H  . senna  1 tablet Oral BID  . sodium chloride  3 mL Intravenous Q12H  . vancomycin  1,000 mg Intravenous Q48H   Continuous Infusions: . diltiazem (CARDIZEM) infusion Stopped (11/12/13  2100)    Active Problems:   HYPOTHYROIDISM   MDS (myelodysplastic syndrome)   Hip fracture   Acute diastolic CHF (congestive heart failure)   Chronic atrial fibrillation   RBBB   Cellulitis and abscess of leg    Time spent: 30 min    Copper Kirtley  [facility] Pager [phone]. If 7PM-7AM, please contact night-coverage at [email or web address], password [facility] 11/13/2013, 8:21 AM  LOS: 4 days

## 2013-11-14 DIAGNOSIS — J9600 Acute respiratory failure, unspecified whether with hypoxia or hypercapnia: Secondary | ICD-10-CM

## 2013-11-14 DIAGNOSIS — S72009A Fracture of unspecified part of neck of unspecified femur, initial encounter for closed fracture: Secondary | ICD-10-CM | POA: Diagnosis not present

## 2013-11-14 DIAGNOSIS — I5031 Acute diastolic (congestive) heart failure: Secondary | ICD-10-CM | POA: Diagnosis not present

## 2013-11-14 DIAGNOSIS — I4891 Unspecified atrial fibrillation: Secondary | ICD-10-CM | POA: Diagnosis not present

## 2013-11-14 LAB — CBC
HCT: 26.3 % — ABNORMAL LOW (ref 36.0–46.0)
Hemoglobin: 8.7 g/dL — ABNORMAL LOW (ref 12.0–15.0)
RBC: 2.79 MIL/uL — ABNORMAL LOW (ref 3.87–5.11)
RDW: 17.9 % — ABNORMAL HIGH (ref 11.5–15.5)

## 2013-11-14 LAB — BASIC METABOLIC PANEL
BUN: 29 mg/dL — ABNORMAL HIGH (ref 6–23)
CO2: 33 mEq/L — ABNORMAL HIGH (ref 19–32)
Chloride: 93 mEq/L — ABNORMAL LOW (ref 96–112)
Glucose, Bld: 108 mg/dL — ABNORMAL HIGH (ref 70–99)
Potassium: 4.7 mEq/L (ref 3.5–5.1)
Sodium: 132 mEq/L — ABNORMAL LOW (ref 135–145)

## 2013-11-14 MED ORDER — ENOXAPARIN SODIUM 40 MG/0.4ML ~~LOC~~ SOLN: 40.0000 mg | Freq: Every day | SUBCUTANEOUS | Status: DC

## 2013-11-14 MED ORDER — ENSURE COMPLETE PO LIQD
237.0000 mL | Freq: Two times a day (BID) | ORAL | Status: DC
  Administered 2013-11-14: 237 mL via ORAL

## 2013-11-14 MED ORDER — FUROSEMIDE 40 MG PO TABS
40.0000 mg | ORAL_TABLET | Freq: Every day | ORAL | Status: DC
  Administered 2013-11-14: 11:00:00 40 mg via ORAL
  Filled 2013-11-14 (×2): qty 1

## 2013-11-14 MED ORDER — ACETAMINOPHEN 325 MG PO TABS
650.0000 mg | ORAL_TABLET | Freq: Four times a day (QID) | ORAL | Status: DC
  Administered 2013-11-14 – 2013-11-18 (×15): 650 mg via ORAL
  Filled 2013-11-14 (×21): qty 2

## 2013-11-14 MED ORDER — HYDROCODONE-ACETAMINOPHEN 5-325 MG PO TABS: 1.0000 | ORAL_TABLET | Freq: Four times a day (QID) | ORAL | Status: DC | PRN

## 2013-11-14 NOTE — Progress Notes (Signed)
Patient's BP 86/31.  Dr. Malachi Bonds notified.  Order parameters entered for metoprolol.   Metoprolol held.  Will continue to monitor patient.

## 2013-11-14 NOTE — Progress Notes (Signed)
Physical Therapy Treatment Patient Details Name: SYBLE PICCO MRN: [id number] DOB: Jan 05, 1920 Today's Date: 11/14/2013 Time: 1022-1049 PT Time Calculation (min): 27 min  PT Assessment / Plan / Recommendation  History of Present Illness s/p ORIF L periprosthetic fx; Postoperatively, the patient went into A. fib and RVR with heart rate in the 150s. Anesthesia was at the bedside and gave her diltiazem 1 mg IV x3. The patient remained asymptomatic, her blood pressure remained stable in the low 100 systolic.    PT Comments   POD # 2 L ORIF 2nd fall/fx.  Pt alert and aware of her current situation.  Pt aware she is no weight on L LE (TTWB).  Assisted pt from supine to EOB required increased time due to pain and 75% VC's on proper tech.  Pt required + 2 assist to pivot 1/4 turn from bed to recliner to pt's R.  Unable to attempt standing with RW due to low performance level with transfer and limited WBing.  Positioned in recliner to comfort and applied ICE packs to L hip as it is notably swallon.  HOYER pad in recliner and instructed nursing to use lift to assist pt back to bed.   Follow Up Recommendations  SNF ([facility])     Does the patient have the potential to tolerate intense rehabilitation     Barriers to Discharge        Equipment Recommendations       Recommendations for Other Services    Frequency Min 3X/week   Progress towards PT Goals Progress towards PT goals: Progressing toward goals  Plan      Precautions / Restrictions Precautions Precautions: Fall Restrictions Weight Bearing Restrictions: Yes LLE Weight Bearing: Touchdown weight bearing    Pertinent Vitals/Pain C/o 8/10 pain with activity Pre medicated ICE applied    Mobility  Bed Mobility Bed Mobility: Supine to Sit;Sitting - Scoot to [place] of Bed;Sit to Supine;Scooting to [facility] Supine to Sit: 1: +2 Total assist Supine to Sit: Patient Percentage: 30% Details for Bed Mobility Assistance: cues for technique, +2 for  UB, LB,lines and safety Transfers Transfers: Stand Pivot Transfers Stand Pivot Transfers: 1: +2 Total assist Stand Pivot Transfers: Patient Percentage: 10% Details for Transfer Assistance: 1/4 turn to pt's R from bed to chair required total assist + 2.  Instructed pt on TTWB. Ambulation/Gait Ambulation/Gait Assistance Details: Unable to attempt 2nd low performance with stand pivot transfer.     Exercises  l Hip  TE's AAROM 10 reps ankle pumps 10 reps knee presses 10 reps heel slides 10 reps SAQ's 10 reps ABD Followed by ICE     PT Goals (current goals can now be found in the care plan section)    Visit Information  Last PT Received On: 11/14/13 Assistance Needed: +2 History of Present Illness: s/p ORIF L periprosthetic fx; Postoperatively, the patient went into A. fib and RVR with heart rate in the 150s. Anesthesia was at the bedside and gave her diltiazem 1 mg IV x3. The patient remained asymptomatic, her blood pressure remained stable in the low 100 systolic.     Subjective Data      Cognition       Balance     End of Session PT - End of Session Equipment Utilized During Treatment: Gait belt Activity Tolerance: Patient limited by fatigue;Patient limited by pain Patient left: in chair;with call bell/phone within reach;with family/visitor present   Felecia Shelling  PTA [facility]  Acute  Rehab Pager  [phone]  

## 2013-11-14 NOTE — Progress Notes (Signed)
Patient oxygen saturation is 93% on 1L [place].

## 2013-11-14 NOTE — Progress Notes (Addendum)
[facility] PROGRESS NOTE  Gina Wilkinson:[id number] DOB: [birth year] DOA: 11/09/2013 PCP: Cassell Clement, MD  Assessment/Plan  Acute hypoxic respiratory failure due to acute on Chronic diastolic CHF with pulmonary edema on chest x-ray -  Agree with restarting lasix -  Check electrolytes in AM -  Cardiology following -  Wean oxygen as tolerated -  No fevers to suggest pneumonia  Atrial fibrillation with RVR, HR in the 90s -  Continue aspirin. Not a warfarin candidate due to myelodysplastic syndrome.  -  Continue beta blocker -  TSH is 1.37  Chronic kidney disease stage III, BUN and creatinine stable  Cellulitis of bilateral lower extremities. Resolving. -  Continue vancomycin day # 6/7 -  Appreciate wound care consultation  -  Blood cultures no growth to date  Right proximal femur fracture status post ORIF on 12/18 -  Appreciate orthopedics assistance -  DVT prophylaxis per orthopedics -  Anticipate SNF for rehabilitation  Myelodysplastic syndrome  Patient usually follows Dr. Cyndie Chime. Getting intermittent Aranesp.  Hgb approximately stable with only slight decline yesterday to today -  Repeat in AM  Hypothyroidism , stable, continue Synthroid  Hyponatremia, likely secondary to acute CHF exacerbation - Restart Lasix  Poor appetite postoperatively (moderate protein cal malnutrition), liberalize diet and add supplements.  Diet:  Regular Access:  PIV IVF:  Off Proph:  Aspirin  Code Status: DO NOT RESUSCITATE Family Communication: Spoke to patient and her daughter Disposition Plan:  Discharge to SNF on Monday when bed available.     Consultants:  Cardiology Dr. Clifton James  Orthopedics Dr. Roda Shutters   Procedures:  XR femur  CXR  ECHO  Antibiotics:  Vancomycin 12/15 >>   HPI/Subjective:  Denies chest pain, shortness of breath, nausea, vomiting, dysuria, lightheadedness  Objective: Filed Vitals:   11/13/13 2100 11/14/13 0000 11/14/13 0500  11/14/13 0700  BP: 97/48  105/46   Pulse: 89  86   Temp: 98 F (36.7 C)  98.5 F (36.9 C)   TempSrc: Oral  Oral   Resp: 16 16 18    Height:      Weight:    72.122 kg (159 lb)  SpO2: 93% 93% 95%     Intake/Output Summary (Last 24 hours) at 11/14/13 1117 Last data filed at 11/14/13 0500  Gross per 24 hour  Intake    360 ml  Output    550 ml  Net   -190 ml   Filed Weights   11/12/13 1700 11/13/13 0500 11/14/13 0700  Weight: 69.9 kg (154 lb 1.6 oz) 72.2 kg (159 lb 2.8 oz) 72.122 kg (159 lb)    Exam:   General:  Caucasian female, No acute distress, nasal cannula in place  HEENT:  NCAT, MMM  Cardiovascular:  IRRR heart rate in the 90s, nl S1, S2, 3/6 systolic murmur at the left sternal border, 2+ pulses, warm extremities  Respiratory:  Rales bilateral bases to the mid-back, no wheezes or rhonchi, no increased WOB  Abdomen:   NABS, soft, NT/ND  MSK:   Normal tone and bulk, trace LEE, left hip dressing with trace amount of blood, drain has small amount of blood, no hematoma, minimal ecchymoses  Neuro:  Grossly intact  Data Reviewed: Basic Metabolic Panel:  Recent Labs Lab 11/10/13 0407 11/11/13 0354 11/12/13 0400 11/12/13 1628 11/13/13 0358 11/13/13 2016 11/14/13 0520  NA 136 135 134* 137 135  --  132*  K 3.9 4.0 4.0 4.2 4.9  --  4.7  CL 94* 95* 91*  97 96  --  93*  CO2 35* 35* 36* 35* 32  --  33*  GLUCOSE 100* 135* 100* 108* 143*  --  108*  BUN 22 31* 28* 24* 28*  --  29*  CREATININE 1.43* 1.71* 1.49* 1.31* 1.32*  --  1.31*  CALCIUM 8.7 8.9 8.5 8.6 8.4  --  8.5  MG  --  2.0  --   --   --  1.9  --    Liver Function Tests: No results found for this basename: AST, ALT, ALKPHOS, BILITOT, PROT, ALBUMIN,  in the last 168 hours No results found for this basename: LIPASE, AMYLASE,  in the last 168 hours No results found for this basename: AMMONIA,  in the last 168 hours CBC:  Recent Labs Lab 11/09/13 1423 11/09/13 1722 11/11/13 0354 11/12/13 0400  11/12/13 1628 11/13/13 0358 11/14/13 0520  WBC 7.9 8.6 7.4 7.0 10.3 5.9 7.5  NEUTROABS 6.4 7.1  --   --   --   --   --   HGB 10.2* 10.2* 8.6* 8.5* 10.3* 9.0* 8.7*  HCT 32.0* 31.6* 26.9* 26.7* 32.0* 28.3* 26.3*  MCV 95.8 95.2 96.1 96.0 96.1 95.9 94.3  PLT 240 224 184 190 209 185 164   Cardiac Enzymes:  Recent Labs Lab 11/09/13 2258 11/10/13 0407 11/12/13 1628 11/12/13 2032 11/13/13 0358  TROPONINI <0.30 <0.30 <0.30 <0.30 <0.30   BNP (last 3 results)  Recent Labs  11/09/13 1721  PROBNP 5281.0*   CBG: No results found for this basename: GLUCAP,  in the last 168 hours  Recent Results (from the past 240 hour(s))  CULTURE, BLOOD (ROUTINE X 2)     Status: None   Collection Time    11/09/13  5:11 PM      Result Value Range Status   Specimen Description BLOOD LEFT ARM   Final   Special Requests BOTTLES DRAWN AEROBIC ONLY 3CC   Final   Culture  Setup Time     Final   Value: 11/10/2013 00:18     Performed at [facility]   Culture     Final   Value:        BLOOD CULTURE RECEIVED NO GROWTH TO DATE CULTURE WILL BE HELD FOR 5 DAYS BEFORE ISSUING A FINAL NEGATIVE REPORT     Performed at [facility]   Report Status PENDING   Incomplete  CULTURE, BLOOD (ROUTINE X 2)     Status: None   Collection Time    11/09/13  5:25 PM      Result Value Range Status   Specimen Description BLOOD RIGHT HAND   Final   Special Requests BOTTLES DRAWN AEROBIC ONLY 3CC   Final   Culture  Setup Time     Final   Value: 11/10/2013 00:18     Performed at [facility]   Culture     Final   Value:        BLOOD CULTURE RECEIVED NO GROWTH TO DATE CULTURE WILL BE HELD FOR 5 DAYS BEFORE ISSUING A FINAL NEGATIVE REPORT     Performed at [facility]   Report Status PENDING   Incomplete  SURGICAL PCR SCREEN     Status: None   Collection Time    11/11/13  3:39 PM      Result Value Range Status   MRSA, PCR NEGATIVE  NEGATIVE Final   Staphylococcus aureus NEGATIVE  NEGATIVE  Final   Comment:  The Xpert SA Assay (FDA     approved for NASAL specimens     in patients over 3 years of age),     is one component of     a comprehensive surveillance     program.  Test performance has     been validated by [employer] for patients greater     than or equal to 14 year old.     It is not intended     to diagnose infection nor to     guide or monitor treatment.     Studies: Dg Femur Left  11/12/2013   CLINICAL DATA:  Fracture fixation  EXAM: LEFT FEMUR - 2 VIEW  COMPARISON:  11/09/2013  FINDINGS: Femoral fracture at the level of the distal femoral stem has been fixed with a long lateral plate and multiple screws. Three cerclage wires are present around the proximal femur. Fracture alignment in satisfactory position. Left hip hemiarthroplasty again noted.  IMPRESSION: Plate and screw fixation femur fracture.   Electronically Signed   By: Marlan Palau M.D.   On: 11/12/2013 16:02   Dg Chest Port 1 View  11/13/2013   CLINICAL DATA:  Congestive heart failure.  EXAM: PORTABLE CHEST - 1 VIEW  COMPARISON:  Chest x-ray 11/12/2013.  Chest x-ray 09/06/2012.  FINDINGS: Cardiomegaly with pulmonary vascular prominence present. Diffuse interstitial prominence with basilar and right upper lobe alveolar infiltrates present. These findings are most consistent with congestive heart failure and pulmonary edema. Underlying pneumonia cannot be excluded. Right-sided pleural effusion is present with probable fluid in the fissures. No pneumothorax. No acute osseous abnormality.  IMPRESSION: Findings most consistent with congestive heart failure and pulmonary edema. Right pleural effusion. No significant interim change noted from prior exam. Underlying pneumonia particularly in the lung bases and right upper lobe cannot be excluded.   Electronically Signed   By: Maisie Fus  Register   On: 11/13/2013 07:22   Dg Chest Port 1 View  11/12/2013   CLINICAL DATA:  Postoperative study,  patient with tachycardia and dyspnea abnormal chest exam  EXAM: PORTABLE CHEST - 1 VIEW  COMPARISON:  Preoperative study dated November 09, 2013.  FINDINGS: The lungs are well-expanded. The interstitial markings remain increased and are somewhat more conspicuous on the right peripherally. The right hemidiaphragm is obscured and there is partial obscuration of the left hemidiaphragm. The cardiopericardial silhouette is enlarged. The pulmonary vascularity is indistinct. There is a moderate-sized hiatal hernia -partially intrathoracic stomach which is not new.  IMPRESSION: The findings are consistent with congestive heart failure with pulmonary interstitial edema. Developing atelectasis or pneumonia in the right lung and at the left lung base is suspected. Continued follow-up films would be useful.   Electronically Signed   By: David  [place]   On: 11/12/2013 16:40   Dg Femur Left Port  11/12/2013   CLINICAL DATA:  Left femur fracture fixation.  EXAM: PORTABLE LEFT FEMUR - 2 VIEW  FINDINGS: There is a long plate, multiple screws and cerclage wires transfixing the periprosthetic femur fracture. No complicating features are demonstrated.  IMPRESSION: Long plate with screws and cerclage wires transfixing the periprosthetic femur fracture.   Electronically Signed   By: Loralie Champagne M.D.   On: 11/12/2013 16:39   Dg C-arm 61-120 Min-no Report  11/12/2013   CLINICAL DATA: left hip fracture   C-ARM 61-120 MINUTES  Fluoroscopy was utilized by the requesting physician.  No radiographic  interpretation.     Scheduled Meds: .  acetaminophen  650 mg Oral Q6H  . antiseptic oral rinse  15 mL Mouth Rinse q12n4p  . aspirin EC  325 mg Oral BID  . chlorhexidine  15 mL Mouth Rinse BID  . furosemide  40 mg Oral Daily  . levothyroxine  125 mcg Oral QAC breakfast  . metoprolol tartrate  25 mg Oral Q8H  . senna  1 tablet Oral BID  . sodium chloride  3 mL Intravenous Q12H  . vancomycin  1,000 mg Intravenous Q48H    Continuous Infusions:    Active Problems:   HYPOTHYROIDISM   ATRIAL FIBRILLATION WITH RAPID VENTRICULAR RESPONSE   MDS (myelodysplastic syndrome)   Hip fracture   Acute diastolic CHF (congestive heart failure)   Chronic atrial fibrillation   RBBB   Cellulitis and abscess of leg   Acute respiratory failure with hypoxia    Time spent: 30 min    Mak Bonny, [facility]  [facility] Pager [phone]. If 7PM-7AM, please contact night-coverage at [email or web address], password [facility] 11/14/2013, 11:17 AM  LOS: 5 days

## 2013-11-14 NOTE — Progress Notes (Signed)
Patient had 200 ml of UOP from 7-3p.  Pt NSL with poor PO intake but did receive 40 mg of PO lasix this AM.  Dr. Malachi Bonds notified.  Will continue to monitor.

## 2013-11-14 NOTE — Progress Notes (Signed)
   Subjective:  Patient reports pain as mild.  Slow with PT 2/2 medical issues.  Objective:   VITALS:   Filed Vitals:   11/13/13 2100 11/14/13 0000 11/14/13 0500 11/14/13 0700  BP: 97/48  105/46   Pulse: 89  86   Temp: 98 F (36.7 C)  98.5 F (36.9 C)   TempSrc: Oral  Oral   Resp: 16 16 18    Height:      Weight:    72.122 kg (159 lb)  SpO2: 93% 93% 95%     Neurologically intact Neurovascular intact Sensation intact distally Intact pulses distally Dorsiflexion/Plantar flexion intact Incision: dressing C/D/I and no drainage No cellulitis present Compartment soft   Lab Results  Component Value Date   WBC 7.5 11/14/2013   HGB 8.7* 11/14/2013   HCT 26.3* 11/14/2013   MCV 94.3 11/14/2013   PLT 164 11/14/2013     Assessment/Plan: 2 Days Post-Op   Problem List Items Addressed This Visit     Cardiovascular and Mediastinum   ATRIAL FIBRILLATION WITH RAPID VENTRICULAR RESPONSE   Relevant Medications      furosemide (LASIX) 40 MG tablet      lisinopril (PRINIVIL,ZESTRIL) 10 MG tablet      heparin injection 5,000 Units (Completed)      aspirin EC tablet 325 mg      diltiazem (CARDIZEM) 1 mg/mL load via infusion 3 mg (Completed)      amiodarone (NEXTERONE) 1.8 mg/mL load via infusion 150 mg (Completed)      metoprolol tartrate (LOPRESSOR) tablet 25 mg      amiodarone (NEXTERONE PREMIX) 360 mg/200 mL dextrose 360 MG/200ML IV infusion (Completed)      aspirin EC tablet      furosemide (LASIX) tablet 40 mg      enoxaparin (LOVENOX) 40 MG/0.4ML injection   Acute diastolic CHF (congestive heart failure)   Chronic atrial fibrillation   RBBB     Musculoskeletal and Integument   Hip fracture   Relevant Orders      Touch down weight bearing     Other   Cellulitis and abscess of leg    Other Visit Diagnoses   Closed left hip fracture, initial encounter    -  Primary    Relevant Orders       Touch down weight bearing    Elevated serum creatinine        Cellulitis  of lower leg           Up with PT/OT when able DVT ppx - SCDs, ambulation, aspirin BID TDWB left lower extremity Pain control HVAC removed    Cheral Almas 11/14/2013, 9:59 AM [phone]

## 2013-11-14 NOTE — Progress Notes (Signed)
Patient Name: Gina Wilkinson Date of Encounter: 11/14/2013     Active Problems:   HYPOTHYROIDISM   ATRIAL FIBRILLATION WITH RAPID VENTRICULAR RESPONSE   MDS (myelodysplastic syndrome)   Hip fracture   Acute diastolic CHF (congestive heart failure)   Chronic atrial fibrillation   RBBB   Cellulitis and abscess of leg   Acute respiratory failure with hypoxia    SUBJECTIVE  Denies any chest pain or dyspnea.  CURRENT MEDS . antiseptic oral rinse  15 mL Mouth Rinse q12n4p  . aspirin EC  325 mg Oral BID  . chlorhexidine  15 mL Mouth Rinse BID  . levothyroxine  125 mcg Oral QAC breakfast  . metoprolol tartrate  25 mg Oral Q8H  . senna  1 tablet Oral BID  . sodium chloride  3 mL Intravenous Q12H  . vancomycin  1,000 mg Intravenous Q48H    OBJECTIVE  Filed Vitals:   11/13/13 2100 11/14/13 0000 11/14/13 0500 11/14/13 0700  BP: 97/48  105/46   Pulse: 89  86   Temp: 98 F (36.7 C)  98.5 F (36.9 C)   TempSrc: Oral  Oral   Resp: 16 16 18    Height:      Weight:    159 lb (72.122 kg)  SpO2: 93% 93% 95%     Intake/Output Summary (Last 24 hours) at 11/14/13 0728 Last data filed at 11/14/13 0500  Gross per 24 hour  Intake    360 ml  Output    550 ml  Net   -190 ml   Filed Weights   11/12/13 1700 11/13/13 0500 11/14/13 0700  Weight: 154 lb 1.6 oz (69.9 kg) 159 lb 2.8 oz (72.2 kg) 159 lb (72.122 kg)    PHYSICAL EXAM  General: Pleasant, NAD. Neuro: Alert and oriented X 3. Moves all extremities spontaneously. Psych: Normal affect. HEENT:  Normal  Neck: Supple without bruits. JVD mildly elevated. Lungs:  Bilateral rales. Heart: Irregular rhythm with atrial fib. Gr 2/6 systolic murmur at base. Abdomen: Soft, non-tender, non-distended, BS + x 4.  Extremities: No clubbing, cyanosis or edema. DP/PT/Radials 2+ and equal bilaterally.  Accessory Clinical Findings  CBC  Recent Labs  11/13/13 0358 11/14/13 0520  WBC 5.9 7.5  HGB 9.0* 8.7*  HCT 28.3* 26.3*  MCV  95.9 94.3  PLT 185 164   Basic Metabolic Panel  Recent Labs  11/13/13 0358 11/13/13 2016 11/14/13 0520  NA 135  --  132*  K 4.9  --  4.7  CL 96  --  93*  CO2 32  --  33*  GLUCOSE 143*  --  108*  BUN 28*  --  29*  CREATININE 1.32*  --  1.31*  CALCIUM 8.4  --  8.5  MG  --  1.9  --    Liver Function Tests No results found for this basename: AST, ALT, ALKPHOS, BILITOT, PROT, ALBUMIN,  in the last 72 hours No results found for this basename: LIPASE, AMYLASE,  in the last 72 hours Cardiac Enzymes  Recent Labs  11/12/13 1628 11/12/13 2032 11/13/13 0358  TROPONINI <0.30 <0.30 <0.30   BNP No components found with this basename: POCBNP,  D-Dimer No results found for this basename: DDIMER,  in the last 72 hours Hemoglobin A1C No results found for this basename: HGBA1C,  in the last 72 hours Fasting Lipid Panel No results found for this basename: CHOL, HDL, LDLCALC, TRIG, CHOLHDL, LDLDIRECT,  in the last 72 hours Thyroid Function Tests No  results found for this basename: TSH, T4TOTAL, FREET3, T3FREE, THYROIDAB,  in the last 72 hours  TELE  Atrial fibrillation with frequent PVCs  ECG     Radiology/Studies  Dg Chest 1 View  11/09/2013   CLINICAL DATA:  Left hip fracture. Recent shortness of breath and coughing. A preop for hip surgery.  EXAM: CHEST - 1 VIEW  COMPARISON:  Two-view chest 09/06/2012. CT chest with contrast 03/16/2009.  FINDINGS: The heart is enlarged. Edematous changes are superimposed on chronic interstitial coarsening. A large hiatal hernia is present. Chronic prominence of the right hila is stable. Atherosclerotic calcifications are present at the aortic arch.  IMPRESSION: 1. New interstitial airspace disease representing edema superimposed on chronic change. 2. Stable cardiomegaly. 3. Stable large hiatal hernia.   Electronically Signed   By: Gennette Pac M.D.   On: 11/09/2013 14:53   Dg Hip Complete Left  11/09/2013   CLINICAL DATA:  Fall, pain.   EXAM: LEFT HIP - COMPLETE 2+ VIEW  COMPARISON:  None.  FINDINGS: There is a periprosthetic proximal femur fracture along the medial and distal aspect of the left total hip replacement femoral component. Slight angulation anteriorly.  IMPRESSION: Left periprosthetic proximal femur fracture.   Electronically Signed   By: Davonna Belling M.D.   On: 11/09/2013 13:32   Dg Femur Left  11/12/2013   CLINICAL DATA:  Fracture fixation  EXAM: LEFT FEMUR - 2 VIEW  COMPARISON:  11/09/2013  FINDINGS: Femoral fracture at the level of the distal femoral stem has been fixed with a long lateral plate and multiple screws. Three cerclage wires are present around the proximal femur. Fracture alignment in satisfactory position. Left hip hemiarthroplasty again noted.  IMPRESSION: Plate and screw fixation femur fracture.   Electronically Signed   By: Marlan Palau M.D.   On: 11/12/2013 16:02   Dg Chest Port 1 View  11/13/2013   CLINICAL DATA:  Congestive heart failure.  EXAM: PORTABLE CHEST - 1 VIEW  COMPARISON:  Chest x-ray 11/12/2013.  Chest x-ray 09/06/2012.  FINDINGS: Cardiomegaly with pulmonary vascular prominence present. Diffuse interstitial prominence with basilar and right upper lobe alveolar infiltrates present. These findings are most consistent with congestive heart failure and pulmonary edema. Underlying pneumonia cannot be excluded. Right-sided pleural effusion is present with probable fluid in the fissures. No pneumothorax. No acute osseous abnormality.  IMPRESSION: Findings most consistent with congestive heart failure and pulmonary edema. Right pleural effusion. No significant interim change noted from prior exam. Underlying pneumonia particularly in the lung bases and right upper lobe cannot be excluded.   Electronically Signed   By: Maisie Fus  Register   On: 11/13/2013 07:22   Dg Chest Port 1 View  11/12/2013   CLINICAL DATA:  Postoperative study, patient with tachycardia and dyspnea abnormal chest exam  EXAM:  PORTABLE CHEST - 1 VIEW  COMPARISON:  Preoperative study dated November 09, 2013.  FINDINGS: The lungs are well-expanded. The interstitial markings remain increased and are somewhat more conspicuous on the right peripherally. The right hemidiaphragm is obscured and there is partial obscuration of the left hemidiaphragm. The cardiopericardial silhouette is enlarged. The pulmonary vascularity is indistinct. There is a moderate-sized hiatal hernia -partially intrathoracic stomach which is not new.  IMPRESSION: The findings are consistent with congestive heart failure with pulmonary interstitial edema. Developing atelectasis or pneumonia in the right lung and at the left lung base is suspected. Continued follow-up films would be useful.   Electronically Signed   By: David  [place]  On: 11/12/2013 16:40   Dg Femur Left Port  11/12/2013   CLINICAL DATA:  Left femur fracture fixation.  EXAM: PORTABLE LEFT FEMUR - 2 VIEW  FINDINGS: There is a long plate, multiple screws and cerclage wires transfixing the periprosthetic femur fracture. No complicating features are demonstrated.  IMPRESSION: Long plate with screws and cerclage wires transfixing the periprosthetic femur fracture.   Electronically Signed   By: Loralie Champagne M.D.   On: 11/12/2013 16:39   Dg C-arm 61-120 Min-no Report  11/12/2013   CLINICAL DATA: left hip fracture   C-ARM 61-120 MINUTES  Fluoroscopy was utilized by the requesting physician.  No radiographic  interpretation.   2D echo: - Left ventricle: The cavity size was normal. There was moderate concentric hypertrophy. Systolic function was normal. The estimated ejection fraction was in the range of 60% to 65%. Wall motion was normal; there were no regional wall motion abnormalities. - Ventricular septum: Septal motion showed abnormal function and dyssynergy. - Aortic valve: Trileaflet; mildly thickened, moderately calcified leaflets. Transvalvular velocity was increased. There was mild to  moderate stenosis. Valve area: 1cm^2(VTI). Valve area: 1.05cm^2 (Vmax). - Mitral valve: Moderately fibrotic annulus. Normal thickness, moderately calcified leaflets . Mild regurgitation. - Left atrium: The atrium was severely dilated. - Right ventricle: The cavity size was mildly dilated. Wall thickness was normal. - Right atrium: The atrium was severely dilated. - Tricuspid valve: Moderate regurgitation. - Pulmonary arteries: Systolic pressure was moderately to severely increased. PA peak pressure: 73mm Hg (S). - Pericardium, extracardiac: A trivial pericardial effusion was identified.   ASSESSMENT AND PLAN 79 yo female with history of diastolic CHF, MDS and permanent atrial fibrillation admitted after mechanical fall resulting in left proximal femur fracture and needs surgery. She was noted to be volume overloaded on exam when admitted. Normal EF and moderate-severe pulmonary HTN on echo. Diuresed well with several doses of Lasix. Now s/p left femur repair. Pt developed atrial fibrillation with RVR post surgery.  1. Acute diastolic CHF: Preserved EF on echo but moderate to severe pulmonary HTN, moderate LVH. Weight is up 5 lb and chest xray shows significant changes suggestive of CHF. 2. Chronic persistent atrial fibrillation: She has been off metoprolol due to low blood pressure during hospitalization. She is on Toprol XL 100 mg daily at home.  Not anticoagulated due to fall risk and h/o MDS. BP is still soft. Will not increase Toprol yet.  3. Aortic valve stenosis: Mild to moderate by echo   Plan: Still volume overloaded. Will restart home dose of lasix today.  Signed, Cassell Clement  MD

## 2013-11-15 DIAGNOSIS — J9600 Acute respiratory failure, unspecified whether with hypoxia or hypercapnia: Secondary | ICD-10-CM | POA: Diagnosis not present

## 2013-11-15 DIAGNOSIS — S72009A Fracture of unspecified part of neck of unspecified femur, initial encounter for closed fracture: Secondary | ICD-10-CM | POA: Diagnosis not present

## 2013-11-15 DIAGNOSIS — I4891 Unspecified atrial fibrillation: Secondary | ICD-10-CM | POA: Diagnosis not present

## 2013-11-15 DIAGNOSIS — I5031 Acute diastolic (congestive) heart failure: Secondary | ICD-10-CM | POA: Diagnosis not present

## 2013-11-15 LAB — BASIC METABOLIC PANEL
CO2: 34 mEq/L — ABNORMAL HIGH (ref 19–32)
Chloride: 91 mEq/L — ABNORMAL LOW (ref 96–112)
GFR calc non Af Amer: 30 mL/min — ABNORMAL LOW (ref >90)
Potassium: 4.8 mEq/L (ref 3.5–5.1)
Sodium: 129 mEq/L — ABNORMAL LOW (ref 135–145)

## 2013-11-15 LAB — CBC
MCHC: 33.3 g/dL (ref 30.0–36.0)
Platelets: 199 10*3/uL (ref 150–400)
RBC: 2.63 MIL/uL — ABNORMAL LOW (ref 3.87–5.11)
RDW: 17.7 % — ABNORMAL HIGH (ref 11.5–15.5)
WBC: 7.3 10*3/uL (ref 4.0–10.5)

## 2013-11-15 MED ORDER — METOPROLOL TARTRATE 12.5 MG HALF TABLET
12.5000 mg | ORAL_TABLET | Freq: Three times a day (TID) | ORAL | Status: DC
  Administered 2013-11-16 – 2013-11-17 (×3): 12.5 mg via ORAL
  Filled 2013-11-15 (×8): qty 1

## 2013-11-15 MED ORDER — FUROSEMIDE 10 MG/ML IJ SOLN
40.0000 mg | Freq: Once | INTRAMUSCULAR | Status: AC
  Administered 2013-11-15: 08:00:00 40 mg via INTRAVENOUS
  Filled 2013-11-15: qty 4

## 2013-11-15 MED ORDER — SODIUM CHLORIDE 0.9 % IV BOLUS (SEPSIS)
250.0000 mL | Freq: Once | INTRAVENOUS | Status: AC
  Administered 2013-11-15: 250 mL via INTRAVENOUS

## 2013-11-15 MED ORDER — DOCUSATE SODIUM 100 MG PO CAPS
100.0000 mg | ORAL_CAPSULE | Freq: Two times a day (BID) | ORAL | Status: DC
  Administered 2013-11-15 – 2013-11-18 (×7): 100 mg via ORAL
  Filled 2013-11-15 (×8): qty 1

## 2013-11-15 MED ORDER — DARBEPOETIN ALFA-POLYSORBATE 300 MCG/0.6ML IJ SOLN
300.0000 ug | Freq: Once | INTRAMUSCULAR | Status: AC
  Administered 2013-11-15: 16:00:00 300 ug via SUBCUTANEOUS
  Filled 2013-11-15: qty 0.6

## 2013-11-15 NOTE — Progress Notes (Signed)
Pt noted to have 3.21s pause on monitor.  Pt BP 79/40.  Dr. Malachi Bonds notified; 250 ml bolus and adjustments to metoprolol made.  Patient resting comfortably in chair no s/s.   Will continue to monitor patient.

## 2013-11-15 NOTE — Progress Notes (Signed)
Patient BP now 90/35 after administering bolus.  Patient O2 sats on room air are 88.  Placed back on 1L, sats at 93%.  Will continue to monitor.

## 2013-11-15 NOTE — Progress Notes (Addendum)
[facility] PROGRESS NOTE  Gina Wilkinson:[id number] DOB: Jan 27, 1920 DOA: 11/09/2013 PCP: Cassell Clement, MD  Assessment/Plan  Acute hypoxic respiratory failure due to acute on Chronic diastolic CHF with pulmonary edema on chest x-ray.  Poor diuresis yesterday with oral lasix.   -  Lasix 40mg  IV once today -  Check electrolytes in AM -  Cardiology following -  Wean oxygen as tolerated -  No fevers to suggest pneumonia  Atrial fibrillation with RVR, HR in the 90s -  Continue aspirin. Not a warfarin candidate due to myelodysplastic syndrome.  -  Continue beta blocker -  TSH is 1.37  Chronic kidney disease stage III, BUN and creatinine rising slightly possible due to progressive heart failure vs. Dehydration.  Cellulitis of bilateral lower extremities. Resolved -  Continue vancomycin day # 7/7 -  Appreciate wound care consultation  -  Blood cultures no growth to date  Right proximal femur fracture status post ORIF on 12/18 -  Appreciate orthopedics assistance -  DVT prophylaxis per orthopedics -  Anticipate SNF for rehabilitation  Myelodysplastic syndrome  Patient usually follows Dr. Cyndie Chime. Getting intermittent Aranesp.  Hgb approximately stable with only slight decline yesterday to today -  Check iron studies, folate -  B12 wnl a month ago -  Give aranesp injection x 1  Hypothyroidism , stable, continue Synthroid  Hyponatremia, likely secondary to acute CHF exacerbation -  Lasix x 1  Poor appetite postoperatively (moderate protein cal malnutrition), liberalize diet and add supplements. -  Nutrition consultation  Diet:  Regular Access:  PIV IVF:  Off Proph:  Aspirin  Code Status: DO NOT RESUSCITATE Family Communication: Spoke to patient alone Disposition Plan:  Discharge to SNF possible Monday if bed available     Consultants:  Cardiology Dr. Clifton James  Orthopedics Dr. Roda Shutters   Procedures:  XR  femur  CXR  ECHO  Antibiotics:  Vancomycin 12/15 >>   HPI/Subjective:  Denies chest pain, shortness of breath, nausea, vomiting, dysuria, lightheadedness  Objective: Filed Vitals:   11/15/13 0000 11/15/13 0400 11/15/13 0415 11/15/13 1330  BP:   96/44 80/36  Pulse:   52 74  Temp:   98.1 F (36.7 C) 97.9 F (36.6 C)  TempSrc:   Oral Oral  Resp: 16 18 16 18   Height:      Weight:   72.5 kg (159 lb 13.3 oz)   SpO2:   93% 96%    Intake/Output Summary (Last 24 hours) at 11/15/13 1343 Last data filed at 11/15/13 1333  Gross per 24 hour  Intake    440 ml  Output   1150 ml  Net   -710 ml   Filed Weights   11/13/13 0500 11/14/13 0700 11/15/13 0415  Weight: 72.2 kg (159 lb 2.8 oz) 72.122 kg (159 lb) 72.5 kg (159 lb 13.3 oz)    Exam:   General:  Caucasian female, No acute distress, nasal cannula in place  HEENT:  NCAT, MMM  Cardiovascular:  IRRR heart rate in the 90s, nl S1, S2, 3/6 systolic murmur at the left sternal border, 2+ pulses, warm extremities  Respiratory:  Rales bilateral bases to the mid-back, less prominent today, no wheezes or rhonchi, no increased WOB  Abdomen:   NABS, soft, NT/ND  MSK:   Normal tone and bulk, trace LEE, left hip dressing with trace amount of blood, drain has small amount of blood, no hematoma, minimal ecchymoses  Neuro:  Grossly intact  Data Reviewed: Basic Metabolic Panel:  Recent  Labs Lab 11/10/13 0407 11/11/13 0354 11/12/13 0400 11/12/13 1628 11/13/13 0358 11/13/13 2016 11/14/13 0520 11/15/13 0620  NA 136 135 134* 137 135  --  132* 129*  K 3.9 4.0 4.0 4.2 4.9  --  4.7 4.8  CL 94* 95* 91* 97 96  --  93* 91*  CO2 35* 35* 36* 35* 32  --  33* 34*  GLUCOSE 100* 135* 100* 108* 143*  --  108* 101*  BUN 22 31* 28* 24* 28*  --  29* 35*  CREATININE 1.43* 1.71* 1.49* 1.31* 1.32*  --  1.31* 1.47*  CALCIUM 8.7 8.9 8.5 8.6 8.4  --  8.5 8.5  MG  --  2.0  --   --   --  1.9  --   --    Liver Function Tests: No results found for  this basename: AST, ALT, ALKPHOS, BILITOT, PROT, ALBUMIN,  in the last 168 hours No results found for this basename: LIPASE, AMYLASE,  in the last 168 hours No results found for this basename: AMMONIA,  in the last 168 hours CBC:  Recent Labs Lab 11/09/13 1423 11/09/13 1722  11/12/13 0400 11/12/13 1628 11/13/13 0358 11/14/13 0520 11/15/13 0620  WBC 7.9 8.6  < > 7.0 10.3 5.9 7.5 7.3  NEUTROABS 6.4 7.1  --   --   --   --   --   --   HGB 10.2* 10.2*  < > 8.5* 10.3* 9.0* 8.7* 8.2*  HCT 32.0* 31.6*  < > 26.7* 32.0* 28.3* 26.3* 24.6*  MCV 95.8 95.2  < > 96.0 96.1 95.9 94.3 93.5  PLT 240 224  < > 190 209 185 164 199  < > = values in this interval not displayed. Cardiac Enzymes:  Recent Labs Lab 11/09/13 2258 11/10/13 0407 11/12/13 1628 11/12/13 2032 11/13/13 0358  TROPONINI <0.30 <0.30 <0.30 <0.30 <0.30   BNP (last 3 results)  Recent Labs  11/09/13 1721  PROBNP 5281.0*   CBG: No results found for this basename: GLUCAP,  in the last 168 hours  Recent Results (from the past 240 hour(s))  CULTURE, BLOOD (ROUTINE X 2)     Status: None   Collection Time    11/09/13  5:11 PM      Result Value Range Status   Specimen Description BLOOD LEFT ARM   Final   Special Requests BOTTLES DRAWN AEROBIC ONLY 3CC   Final   Culture  Setup Time     Final   Value: 11/10/2013 00:18     Performed at [facility]   Culture     Final   Value:        BLOOD CULTURE RECEIVED NO GROWTH TO DATE CULTURE WILL BE HELD FOR 5 DAYS BEFORE ISSUING A FINAL NEGATIVE REPORT     Performed at [facility]   Report Status PENDING   Incomplete  CULTURE, BLOOD (ROUTINE X 2)     Status: None   Collection Time    11/09/13  5:25 PM      Result Value Range Status   Specimen Description BLOOD RIGHT HAND   Final   Special Requests BOTTLES DRAWN AEROBIC ONLY 3CC   Final   Culture  Setup Time     Final   Value: 11/10/2013 00:18     Performed at [facility]   Culture     Final   Value:         BLOOD CULTURE RECEIVED NO GROWTH TO DATE  CULTURE WILL BE HELD FOR 5 DAYS BEFORE ISSUING A FINAL NEGATIVE REPORT     Performed at [facility]   Report Status PENDING   Incomplete  SURGICAL PCR SCREEN     Status: None   Collection Time    11/11/13  3:39 PM      Result Value Range Status   MRSA, PCR NEGATIVE  NEGATIVE Final   Staphylococcus aureus NEGATIVE  NEGATIVE Final   Comment:            The Xpert SA Assay (FDA     approved for NASAL specimens     in patients over 76 years of age),     is one component of     a comprehensive surveillance     program.  Test performance has     been validated by [employer] for patients greater     than or equal to 47 year old.     It is not intended     to diagnose infection nor to     guide or monitor treatment.     Studies: No results found.  Scheduled Meds: . acetaminophen  650 mg Oral Q6H  . antiseptic oral rinse  15 mL Mouth Rinse q12n4p  . aspirin EC  325 mg Oral BID  . chlorhexidine  15 mL Mouth Rinse BID  . feeding supplement (ENSURE COMPLETE)  237 mL Oral BID BM  . levothyroxine  125 mcg Oral QAC breakfast  . metoprolol tartrate  25 mg Oral Q8H  . senna  1 tablet Oral BID  . sodium chloride  3 mL Intravenous Q12H  . vancomycin  1,000 mg Intravenous Q48H   Continuous Infusions:    Active Problems:   HYPOTHYROIDISM   ATRIAL FIBRILLATION WITH RAPID VENTRICULAR RESPONSE   MDS (myelodysplastic syndrome)   Hip fracture   Acute diastolic CHF (congestive heart failure)   Chronic atrial fibrillation   RBBB   Cellulitis and abscess of leg   Acute respiratory failure with hypoxia    Time spent: 30 min    Lexxus Underhill, [facility]  [facility] Pager [phone]. If 7PM-7AM, please contact night-coverage at [email or web address], password [facility] 11/15/2013, 1:43 PM  LOS: 6 days

## 2013-11-15 NOTE — Progress Notes (Signed)
Subjective: 3 Days Post-Op Procedure(s) (LRB): OPEN REDUCTION INTERNAL FIXATION (ORIF) LEFT HIP PERIPROSTHETIC FRACTURE, (Left) Patient reports pain as mild.    Objective: Vital signs in last 24 hours: Temp:  [97.7 F (36.5 C)-98.1 F (36.7 C)] 98.1 F (36.7 C) (12/21 0415) Pulse Rate:  [52-92] 52 (12/21 0415) Resp:  [14-18] 16 (12/21 0415) BP: (86-96)/(31-44) 96/44 mmHg (12/21 0415) SpO2:  [93 %-100 %] 93 % (12/21 0415) Weight:  [72.5 kg (159 lb 13.3 oz)] 72.5 kg (159 lb 13.3 oz) (12/21 0415)  Intake/Output from previous day: 12/20 0701 - 12/21 0700 In: 380 [P.O.:180; IV Piggyback:200] Out: 600 [Urine:600] Intake/Output this shift:     Recent Labs  11/12/13 1628 11/13/13 0358 11/14/13 0520 11/15/13 0620  HGB 10.3* 9.0* 8.7* 8.2*    Recent Labs  11/14/13 0520 11/15/13 0620  WBC 7.5 7.3  RBC 2.79* 2.63*  HCT 26.3* 24.6*  PLT 164 199    Recent Labs  11/14/13 0520 11/15/13 0620  NA 132* 129*  K 4.7 4.8  CL 93* 91*  CO2 33* 34*  BUN 29* 35*  CREATININE 1.31* 1.47*  GLUCOSE 108* 101*  CALCIUM 8.5 8.5   No results found for this basename: LABPT, INR,  in the last 72 hours  Neurologically intact Compartment soft dressing some old blood  Assessment/Plan: 3 Days Post-Op Procedure(s) (LRB): OPEN REDUCTION INTERNAL FIXATION (ORIF) LEFT HIP PERIPROSTHETIC FRACTURE, (Left) Up with therapy,  Dressing change.   Sodium 129 and Creatinine has increased from 1.31 to 1.47. Post op xrays look good.   Gina Wilkinson C 11/15/2013, 10:50 AM

## 2013-11-15 NOTE — Progress Notes (Signed)
Patient Name: Gina Wilkinson Date of Encounter: 11/15/2013     Active Problems:   HYPOTHYROIDISM   ATRIAL FIBRILLATION WITH RAPID VENTRICULAR RESPONSE   MDS (myelodysplastic syndrome)   Hip fracture   Acute diastolic CHF (congestive heart failure)   Chronic atrial fibrillation   RBBB   Cellulitis and abscess of leg   Acute respiratory failure with hypoxia    SUBJECTIVE  Patient denies chest pain or shortness of breath.  Still weak.  CURRENT MEDS . acetaminophen  650 mg Oral Q6H  . antiseptic oral rinse  15 mL Mouth Rinse q12n4p  . aspirin EC  325 mg Oral BID  . chlorhexidine  15 mL Mouth Rinse BID  . feeding supplement (ENSURE COMPLETE)  237 mL Oral BID BM  . furosemide  40 mg Intravenous Once  . levothyroxine  125 mcg Oral QAC breakfast  . metoprolol tartrate  25 mg Oral Q8H  . senna  1 tablet Oral BID  . sodium chloride  3 mL Intravenous Q12H  . vancomycin  1,000 mg Intravenous Q48H    OBJECTIVE  Filed Vitals:   11/14/13 2115 11/15/13 0000 11/15/13 0400 11/15/13 0415  BP: 95/44   96/44  Pulse: 80   52  Temp: 98.1 F (36.7 C)   98.1 F (36.7 C)  TempSrc: Oral   Oral  Resp: 14 16 18 16   Height:      Weight:    159 lb 13.3 oz (72.5 kg)  SpO2: 100%   93%    Intake/Output Summary (Last 24 hours) at 11/15/13 0730 Last data filed at 11/15/13 0600  Gross per 24 hour  Intake    380 ml  Output    600 ml  Net   -220 ml   Filed Weights   11/13/13 0500 11/14/13 0700 11/15/13 0415  Weight: 159 lb 2.8 oz (72.2 kg) 159 lb (72.122 kg) 159 lb 13.3 oz (72.5 kg)    PHYSICAL EXAM  General: Pleasant, NAD. Neuro: Alert and oriented X 3. Moves all extremities spontaneously. Psych: Normal affect. HEENT:  Normal  Neck: Supple without bruits.  Jugular venous pressure is elevated Lungs:  Bilateral inspiratory rales Heart: RRR no s3, s4, and there is a grade 2/6 systolic murmur at apex and base.  No diastolic murmur Abdomen: Soft, non-tender, non-distended, BS + x  4.  Extremities: No clubbing, cyanosis or edema. DP/PT/Radials 2+ and equal bilaterally.  Accessory Clinical Findings  CBC  Recent Labs  11/14/13 0520 11/15/13 0620  WBC 7.5 7.3  HGB 8.7* 8.2*  HCT 26.3* 24.6*  MCV 94.3 93.5  PLT 164 199   Basic Metabolic Panel  Recent Labs  11/13/13 2016 11/14/13 0520 11/15/13 0620  NA  --  132* 129*  K  --  4.7 4.8  CL  --  93* 91*  CO2  --  33* 34*  GLUCOSE  --  108* 101*  BUN  --  29* 35*  CREATININE  --  1.31* 1.47*  CALCIUM  --  8.5 8.5  MG 1.9  --   --    Liver Function Tests No results found for this basename: AST, ALT, ALKPHOS, BILITOT, PROT, ALBUMIN,  in the last 72 hours No results found for this basename: LIPASE, AMYLASE,  in the last 72 hours Cardiac Enzymes  Recent Labs  11/12/13 1628 11/12/13 2032 11/13/13 0358  TROPONINI <0.30 <0.30 <0.30   BNP No components found with this basename: POCBNP,  D-Dimer No results found for this  basename: DDIMER,  in the last 72 hours Hemoglobin A1C No results found for this basename: HGBA1C,  in the last 72 hours Fasting Lipid Panel No results found for this basename: CHOL, HDL, LDLCALC, TRIG, CHOLHDL, LDLDIRECT,  in the last 72 hours Thyroid Function Tests No results found for this basename: TSH, T4TOTAL, FREET3, T3FREE, THYROIDAB,  in the last 72 hours  TELE  Atrial fibrillation with controlled ventricular response  ECG   Radiology/Studies  Dg Chest 1 View  11/09/2013   CLINICAL DATA:  Left hip fracture. Recent shortness of breath and coughing. A preop for hip surgery.  EXAM: CHEST - 1 VIEW  COMPARISON:  Two-view chest 09/06/2012. CT chest with contrast 03/16/2009.  FINDINGS: The heart is enlarged. Edematous changes are superimposed on chronic interstitial coarsening. A large hiatal hernia is present. Chronic prominence of the right hila is stable. Atherosclerotic calcifications are present at the aortic arch.  IMPRESSION: 1. New interstitial airspace disease  representing edema superimposed on chronic change. 2. Stable cardiomegaly. 3. Stable large hiatal hernia.   Electronically Signed   By: Gennette Pac M.D.   On: 11/09/2013 14:53   Dg Hip Complete Left  11/09/2013   CLINICAL DATA:  Fall, pain.  EXAM: LEFT HIP - COMPLETE 2+ VIEW  COMPARISON:  None.  FINDINGS: There is a periprosthetic proximal femur fracture along the medial and distal aspect of the left total hip replacement femoral component. Slight angulation anteriorly.  IMPRESSION: Left periprosthetic proximal femur fracture.   Electronically Signed   By: Davonna Belling M.D.   On: 11/09/2013 13:32   Dg Femur Left  11/12/2013   CLINICAL DATA:  Fracture fixation  EXAM: LEFT FEMUR - 2 VIEW  COMPARISON:  11/09/2013  FINDINGS: Femoral fracture at the level of the distal femoral stem has been fixed with a long lateral plate and multiple screws. Three cerclage wires are present around the proximal femur. Fracture alignment in satisfactory position. Left hip hemiarthroplasty again noted.  IMPRESSION: Plate and screw fixation femur fracture.   Electronically Signed   By: Marlan Palau M.D.   On: 11/12/2013 16:02   Dg Chest Port 1 View  11/13/2013   CLINICAL DATA:  Congestive heart failure.  EXAM: PORTABLE CHEST - 1 VIEW  COMPARISON:  Chest x-ray 11/12/2013.  Chest x-ray 09/06/2012.  FINDINGS: Cardiomegaly with pulmonary vascular prominence present. Diffuse interstitial prominence with basilar and right upper lobe alveolar infiltrates present. These findings are most consistent with congestive heart failure and pulmonary edema. Underlying pneumonia cannot be excluded. Right-sided pleural effusion is present with probable fluid in the fissures. No pneumothorax. No acute osseous abnormality.  IMPRESSION: Findings most consistent with congestive heart failure and pulmonary edema. Right pleural effusion. No significant interim change noted from prior exam. Underlying pneumonia particularly in the lung bases and  right upper lobe cannot be excluded.   Electronically Signed   By: Maisie Fus  Register   On: 11/13/2013 07:22   Dg Chest Port 1 View  11/12/2013   CLINICAL DATA:  Postoperative study, patient with tachycardia and dyspnea abnormal chest exam  EXAM: PORTABLE CHEST - 1 VIEW  COMPARISON:  Preoperative study dated November 09, 2013.  FINDINGS: The lungs are well-expanded. The interstitial markings remain increased and are somewhat more conspicuous on the right peripherally. The right hemidiaphragm is obscured and there is partial obscuration of the left hemidiaphragm. The cardiopericardial silhouette is enlarged. The pulmonary vascularity is indistinct. There is a moderate-sized hiatal hernia -partially intrathoracic stomach which is not new.  IMPRESSION: The findings are consistent with congestive heart failure with pulmonary interstitial edema. Developing atelectasis or pneumonia in the right lung and at the left lung base is suspected. Continued follow-up films would be useful.   Electronically Signed   By: David  [place]   On: 11/12/2013 16:40   Dg Femur Left Port  11/12/2013   CLINICAL DATA:  Left femur fracture fixation.  EXAM: PORTABLE LEFT FEMUR - 2 VIEW  FINDINGS: There is a long plate, multiple screws and cerclage wires transfixing the periprosthetic femur fracture. No complicating features are demonstrated.  IMPRESSION: Long plate with screws and cerclage wires transfixing the periprosthetic femur fracture.   Electronically Signed   By: Loralie Champagne M.D.   On: 11/12/2013 16:39   Dg C-arm 61-120 Min-no Report  11/12/2013   CLINICAL DATA: left hip fracture   C-ARM 61-120 MINUTES  Fluoroscopy was utilized by the requesting physician.  No radiographic  interpretation.     ASSESSMENT AND PLAN 79 yo female with history of diastolic CHF, MDS and permanent atrial fibrillation admitted after mechanical fall resulting in left proximal femur fracture and needs surgery. She was noted to be volume overloaded  on exam when admitted. Normal EF and moderate-severe pulmonary HTN on echo. Diuresed well with several doses of Lasix. Now s/p left femur repair. Pt developed atrial fibrillation with RVR post surgery.  1. Acute diastolic CHF: Preserved EF on echo but moderate to severe pulmonary HTN, moderate LVH.  Her chest xray shows significant changes suggestive of CHF.  2. Chronic persistent atrial fibrillation: Rate is adequately controlled at present.  PVCs have decreased since yesterday.  Blood pressure is still soft so unable to increase beta blocker yet back to home dose 3. Aortic valve stenosis: Mild to moderate by echo   Plan: She did not have much response to oral Lasix yesterday.  Agree with switch to IV Lasix today.  She still has pulmonary vascular congestion with rales.  Signed, Cassell Clement  MD

## 2013-11-15 NOTE — Progress Notes (Signed)
Patient BP 80/36.  Dr. Malachi Bonds notified.  Patient asymptomatic; alert, oriented comfortable in chair.  Order placed for consult to dietician.  Patient UOP totals 550 ml so far today. Will continue to monitor.

## 2013-11-16 ENCOUNTER — Inpatient Hospital Stay (HOSPITAL_COMMUNITY): Payer: Medicare Other

## 2013-11-16 DIAGNOSIS — I2789 Other specified pulmonary heart diseases: Secondary | ICD-10-CM | POA: Diagnosis not present

## 2013-11-16 DIAGNOSIS — J9600 Acute respiratory failure, unspecified whether with hypoxia or hypercapnia: Secondary | ICD-10-CM | POA: Diagnosis not present

## 2013-11-16 DIAGNOSIS — I5031 Acute diastolic (congestive) heart failure: Secondary | ICD-10-CM | POA: Diagnosis not present

## 2013-11-16 DIAGNOSIS — I509 Heart failure, unspecified: Secondary | ICD-10-CM | POA: Diagnosis not present

## 2013-11-16 DIAGNOSIS — J90 Pleural effusion, not elsewhere classified: Secondary | ICD-10-CM | POA: Diagnosis not present

## 2013-11-16 DIAGNOSIS — J811 Chronic pulmonary edema: Secondary | ICD-10-CM | POA: Diagnosis not present

## 2013-11-16 DIAGNOSIS — IMO0002 Reserved for concepts with insufficient information to code with codable children: Secondary | ICD-10-CM | POA: Diagnosis not present

## 2013-11-16 DIAGNOSIS — S72009A Fracture of unspecified part of neck of unspecified femur, initial encounter for closed fracture: Secondary | ICD-10-CM | POA: Diagnosis not present

## 2013-11-16 DIAGNOSIS — I4891 Unspecified atrial fibrillation: Secondary | ICD-10-CM | POA: Diagnosis not present

## 2013-11-16 LAB — CULTURE, BLOOD (ROUTINE X 2): Culture: NO GROWTH

## 2013-11-16 LAB — BASIC METABOLIC PANEL
CO2: 33 mEq/L — ABNORMAL HIGH (ref 19–32)
Glucose, Bld: 95 mg/dL (ref 70–99)
Potassium: 4.6 mEq/L (ref 3.5–5.1)
Sodium: 134 mEq/L — ABNORMAL LOW (ref 135–145)

## 2013-11-16 LAB — IRON AND TIBC
Saturation Ratios: 15 % — ABNORMAL LOW (ref 20–55)
TIBC: 179 ug/dL — ABNORMAL LOW (ref 250–470)

## 2013-11-16 LAB — FERRITIN: Ferritin: 752 ng/mL — ABNORMAL HIGH (ref 10–291)

## 2013-11-16 LAB — TRANSFERRIN: Transferrin: 146 mg/dL — ABNORMAL LOW (ref 200–360)

## 2013-11-16 MED ORDER — FUROSEMIDE 40 MG PO TABS
40.0000 mg | ORAL_TABLET | Freq: Every day | ORAL | Status: DC
  Administered 2013-11-16 – 2013-11-18 (×3): 40 mg via ORAL
  Filled 2013-11-16 (×3): qty 1

## 2013-11-16 NOTE — Care Management Note (Addendum)
    Page 1 of 2   11/18/2013     2:13:05 PM   CARE MANAGEMENT NOTE 11/18/2013  Patient:  Gina Wilkinson, Gina Wilkinson   Account Number:  [id number]  Date Initiated:  11/10/2013  Documentation initiated by:  DAVIS,RHONDA  Subjective/Objective Assessment:   pt sustained fx hip s/p fall.  chf-being stablized for surg due to hypotension     Action/Plan:   surgical intervention when chf stablized   Anticipated DC Date:  11/20/2013   Anticipated DC Plan:  SKILLED NURSING FACILITY  In-house referral  Clinical Social Worker      DC Planning Services  CM consult      [facility] Choice  NA   Choice offered to / List presented to:  NA   DME arranged  NA      DME agency  NA     HH arranged  NA      HH agency  NA   Status of service:  In process, will continue to follow Medicare Important Message given?  YES (If response is "NO", the following Medicare IM given date fields will be blank) Date Medicare IM given:  11/17/2013 Date Additional Medicare IM given:  11/17/2013  Discharge Disposition:    Per UR Regulation:  Reviewed for med. necessity/level of care/duration of stay  If discussed at Long Length of Stay Meetings, dates discussed:   11/17/2013    Comments:  11/18/13 Gina Haubner RN,BSN NCM [phone] D/C PLAN SNF WHEN MEDICALLY STABLE.  11/16/13 Gina Birkel RN,BSN NCM [phone] RESP FAILURE,PLEURAL EFFUSION,PULMONARY EDEMA,AFIB RVR.D/C PLAN SNF.  11/13/13 Gina Gardin RN,BSN NCM [phone] TRANSFER FROM SDU.[facility] PLAN SNF.  [id number] Earlene Plater, RN, BSN, [place] [phone] Chart Reviewed for discharge and hospital needs. Discharge needs at time of review:  None present will follow for needs. Review of patient progress due on [id number].   Rhonda Davis,RN,BSN,CCM

## 2013-11-16 NOTE — Progress Notes (Signed)
Physical Therapy Treatment Patient Details Name: Gina Wilkinson MRN: [id number] DOB: [birth year] Today's Date: 11/16/2013 Time: 0865-7846 PT Time Calculation (min): 25 min  PT Assessment / Plan / Recommendation  History of Present Illness s/p ORIF L periprosthetic fx; Postoperatively, the patient went into A. fib and RVR with heart rate in the 150s. Anesthesia was at the bedside and gave her diltiazem 1 mg IV x3. The patient remained asymptomatic, her blood pressure remained stable in the low 100 systolic.    PT Comments   Assisted pt OOB to recliner.  Pt required + 2 assist.  Performed hip TE's then applied ICE.  Follow Up Recommendations  SNF     Does the patient have the potential to tolerate intense rehabilitation     Barriers to Discharge        Equipment Recommendations       Recommendations for Other Services    Frequency Min 3X/week   Progress towards PT Goals Progress towards PT goals: Progressing toward goals  Plan      Precautions / Restrictions Precautions Precautions: Fall Restrictions Weight Bearing Restrictions: Yes LLE Weight Bearing: Touchdown weight bearing    Pertinent Vitals/Pain C/o L hip pain during act but unable to rate    Mobility  Bed Mobility Bed Mobility: Supine to Sit;Sitting - Scoot to Edge of Bed Supine to Sit: 1: +2 Total assist Sitting - Scoot to Edge of Bed: 1: +1 Total assist Details for Bed Mobility Assistance: cues for technique, +2 for UB, LB,lines and safety Transfers Transfers: Sit to Stand;Stand to Sit Sit to Stand: 1: +2 Total assist;From bed Sit to Stand: Patient Percentage: 40% Stand to Sit: 1: +2 Total assist;To chair/3-in-1 Stand to Sit: Patient Percentage: 40% Details for Transfer Assistance: pt more able to stand with + 2 assist and TTWB at 75% VC's.  Pt stood x 25 seconds then took a few side steps "hops" from bed to recliner.   Ambulation/Gait Ambulation/Gait Assistance: 1: +2 Total assist Ambulation/Gait: Patient  Percentage: 40% Ambulation Distance (Feet): 1 Feet Ambulation/Gait Assistance Details: a few side steps from bed to recliner with MAX VC's on TTWB L LE. Gait Pattern: Step-to pattern    Exercises   Hip  TE's AAROM 10 reps ankle pumps 10 reps knee presses 10 reps heel slides 10 reps SAQ's 10 reps ABD Followed by ICE    PT Goals (current goals can now be found in the care plan section)    Visit Information  Last PT Received On: 11/16/13 Assistance Needed: +2 History of Present Illness: s/p ORIF L periprosthetic fx; Postoperatively, the patient went into A. fib and RVR with heart rate in the 150s. Anesthesia was at the bedside and gave her diltiazem 1 mg IV x3. The patient remained asymptomatic, her blood pressure remained stable in the low 100 systolic.     Subjective Data      Cognition       Balance     End of Session PT - End of Session Equipment Utilized During Treatment: Gait belt Activity Tolerance: Patient limited by fatigue;Patient limited by pain Patient left: in chair;with call bell/phone within reach;with family/visitor present   Felecia Shelling  PTA [facility]  Acute  Rehab Pager      [phone]

## 2013-11-16 NOTE — Progress Notes (Signed)
[facility] PROGRESS NOTE  Gina Wilkinson BMW:[id number] DOB: [birth year] DOA: 11/09/2013 PCP: Cassell Clement, MD  Assessment/Plan  Acute hypoxic respiratory failure due to acute on Chronic diastolic CHF with pulmonary edema on chest x-ray.   -  Hypotensive after lasix 40mg  IV once yesterday -  Resume home lasix dose today per cardiology -  Still with pleural effusions and pulmonary edema on CXR and persistent respiratory failure -  Appreciate Cardiology assistance -  Wean oxygen as tolerated -  No fevers to suggest pneumonia  Atrial fibrillation with RVR, HR in the 90s -  Continue aspirin. Not a warfarin candidate due to myelodysplastic syndrome.  -  Beta blocker reduced yesterday due to hypotension -  Trend HR on telemetry today and if HR remaining stable, possible d/c tomorrow to SNF -  TSH is 1.37  Chronic kidney disease stage III, BUN and creatinine stable.   Cellulitis of bilateral lower extremities. Resolved.  Completed 7 days of vancomycin on 12/21 -  Appreciate wound care consultation  -  Blood cultures no growth to date  Right proximal femur fracture status post ORIF on 12/18 -  Appreciate orthopedics assistance -  DVT prophylaxis per orthopedics -  Anticipate SNF for rehabilitation  Myelodysplastic syndrome  Patient usually follows Dr. Cyndie Chime. Getting intermittent Aranesp.  Hgb approximately stable with only slight decline yesterday to today -  Check iron studies, folate pending -  B12 wnl a month ago -  Give aranesp injection x 1  Hypothyroidism , stable, continue Synthroid  Hyponatremia, likely secondary to acute CHF exacerbation and improved with diuresis  Poor appetite postoperatively (moderate protein cal malnutrition), liberalize diet and add supplements. -  Nutrition consultation  Diet:  Regular Access:  PIV IVF:  Off Proph:  Aspirin  Code Status: DO NOT RESUSCITATE Family Communication: Spoke to patient alone Disposition Plan:   Monitor on telemetry for 24 hours on reduced metoprolol dose to ensure no a-fib with RVR and trend BP.    Consultants:  Cardiology Dr. Clifton James  Orthopedics Dr. Roda Shutters   Procedures:  XR femur  CXR  ECHO  Antibiotics:  Vancomycin 12/15 >>   HPI/Subjective:  Denies chest pain, shortness of breath, nausea, vomiting, dysuria, lightheadedness  Objective: Filed Vitals:   11/15/13 2050 11/16/13 0430 11/16/13 0651 11/16/13 0940  BP: 84/50 96/40 100/48   Pulse:  76 84   Temp:  97.8 F (36.6 C)    TempSrc:  Oral    Resp:  18    Height:      Weight:  72.1 kg (158 lb 15.2 oz)    SpO2:  100%  86%    Intake/Output Summary (Last 24 hours) at 11/16/13 0958 Last data filed at 11/16/13 2725  Gross per 24 hour  Intake    720 ml  Output   1600 ml  Net   -880 ml   Filed Weights   11/14/13 0700 11/15/13 0415 11/16/13 0430  Weight: 72.122 kg (159 lb) 72.5 kg (159 lb 13.3 oz) 72.1 kg (158 lb 15.2 oz)    Exam:   General:  Caucasian female, No acute distress, nasal cannula in place  HEENT:  NCAT, MMM  Cardiovascular:  IRRR heart rate in the 90s, nl S1, S2, 3/6 systolic murmur at the left sternal border, 2+ pulses, warm extremities  Respiratory:  Rales bilateral bases to the mid-back, less prominent today, no wheezes or rhonchi, no increased WOB  Abdomen:   NABS, soft, NT/ND  MSK:   Normal tone  and bulk, trace LEE, left hip dressing with trace amount of blood, drain has small amount of blood, no hematoma, minimal ecchymoses  Neuro:  Grossly intact  Data Reviewed: Basic Metabolic Panel:  Recent Labs Lab 11/10/13 0407 11/11/13 0354  11/12/13 1628 11/13/13 0358 11/13/13 2016 11/14/13 0520 11/15/13 0620 11/16/13 0455  NA 136 135  < > 137 135  --  132* 129* 134*  K 3.9 4.0  < > 4.2 4.9  --  4.7 4.8 4.6  CL 94* 95*  < > 97 96  --  93* 91* 95*  CO2 35* 35*  < > 35* 32  --  33* 34* 33*  GLUCOSE 100* 135*  < > 108* 143*  --  108* 101* 95  BUN 22 31*  < > 24* 28*  --   29* 35* 35*  CREATININE 1.43* 1.71*  < > 1.31* 1.32*  --  1.31* 1.47* 1.47*  CALCIUM 8.7 8.9  < > 8.6 8.4  --  8.5 8.5 8.4  MG  --  2.0  --   --   --  1.9  --   --   --   < > = values in this interval not displayed. Liver Function Tests: No results found for this basename: AST, ALT, ALKPHOS, BILITOT, PROT, ALBUMIN,  in the last 168 hours No results found for this basename: LIPASE, AMYLASE,  in the last 168 hours No results found for this basename: AMMONIA,  in the last 168 hours CBC:  Recent Labs Lab 11/09/13 1423 11/09/13 1722  11/12/13 0400 11/12/13 1628 11/13/13 0358 11/14/13 0520 11/15/13 0620  WBC 7.9 8.6  < > 7.0 10.3 5.9 7.5 7.3  NEUTROABS 6.4 7.1  --   --   --   --   --   --   HGB 10.2* 10.2*  < > 8.5* 10.3* 9.0* 8.7* 8.2*  HCT 32.0* 31.6*  < > 26.7* 32.0* 28.3* 26.3* 24.6*  MCV 95.8 95.2  < > 96.0 96.1 95.9 94.3 93.5  PLT 240 224  < > 190 209 185 164 199  < > = values in this interval not displayed. Cardiac Enzymes:  Recent Labs Lab 11/09/13 2258 11/10/13 0407 11/12/13 1628 11/12/13 2032 11/13/13 0358  TROPONINI <0.30 <0.30 <0.30 <0.30 <0.30   BNP (last 3 results)  Recent Labs  11/09/13 1721  PROBNP 5281.0*   CBG: No results found for this basename: GLUCAP,  in the last 168 hours  Recent Results (from the past 240 hour(s))  CULTURE, BLOOD (ROUTINE X 2)     Status: None   Collection Time    11/09/13  5:11 PM      Result Value Range Status   Specimen Description BLOOD LEFT ARM   Final   Special Requests BOTTLES DRAWN AEROBIC ONLY 3CC   Final   Culture  Setup Time     Final   Value: 11/10/2013 00:18     Performed at [facility]   Culture     Final   Value: NO GROWTH 5 DAYS     Performed at [facility]   Report Status 11/16/2013 FINAL   Final  CULTURE, BLOOD (ROUTINE X 2)     Status: None   Collection Time    11/09/13  5:25 PM      Result Value Range Status   Specimen Description BLOOD RIGHT HAND   Final   Special Requests  BOTTLES DRAWN AEROBIC ONLY 3CC   Final  Culture  Setup Time     Final   Value: 11/10/2013 00:18     Performed at [facility]   Culture     Final   Value: NO GROWTH 5 DAYS     Performed at [facility]   Report Status 11/16/2013 FINAL   Final  SURGICAL PCR SCREEN     Status: None   Collection Time    11/11/13  3:39 PM      Result Value Range Status   MRSA, PCR NEGATIVE  NEGATIVE Final   Staphylococcus aureus NEGATIVE  NEGATIVE Final   Comment:            The Xpert SA Assay (FDA     approved for NASAL specimens     in patients over 24 years of age),     is one component of     a comprehensive surveillance     program.  Test performance has     been validated by [employer] for patients greater     than or equal to 41 year old.     It is not intended     to diagnose infection nor to     guide or monitor treatment.     Studies: Dg Chest Port 1 View  11/16/2013   CLINICAL DATA:  Heart failure.  EXAM: PORTABLE CHEST - 1 VIEW  COMPARISON:  11/13/2013.  FINDINGS: Persistent severe cardiomegaly. Mild pulmonary venous congestion. Previously identified pulmonary interstitial edema and pleural effusions have improved slightly. No new infiltrates noted. No evidence of pneumothorax. No acute osseous abnormality .  IMPRESSION: 1. Severe cardiomegaly. 2. Mild improvement of pulmonary interstitial edema and pleural effusions. Persistent interstitial edema and pleural effusions remain.   Electronically Signed   By: Maisie Fus  Register   On: 11/16/2013 07:30    Scheduled Meds: . acetaminophen  650 mg Oral Q6H  . antiseptic oral rinse  15 mL Mouth Rinse q12n4p  . aspirin EC  325 mg Oral BID  . chlorhexidine  15 mL Mouth Rinse BID  . docusate sodium  100 mg Oral BID  . feeding supplement (ENSURE COMPLETE)  237 mL Oral BID BM  . furosemide  40 mg Oral Daily  . levothyroxine  125 mcg Oral QAC breakfast  . metoprolol tartrate  12.5 mg Oral Q8H  . senna  1 tablet Oral BID  .  sodium chloride  3 mL Intravenous Q12H   Continuous Infusions:    Active Problems:   HYPOTHYROIDISM   ATRIAL FIBRILLATION WITH RAPID VENTRICULAR RESPONSE   MDS (myelodysplastic syndrome)   Hip fracture   Acute diastolic CHF (congestive heart failure)   Chronic atrial fibrillation   RBBB   Cellulitis and abscess of leg   Acute respiratory failure with hypoxia    Time spent: 30 min    Taino Maertens, [facility]  [facility] Pager [phone]. If 7PM-7AM, please contact night-coverage at [email or web address], password [facility] 11/16/2013, 9:58 AM  LOS: 7 days

## 2013-11-16 NOTE — Progress Notes (Signed)
NUTRITION FOLLOW UP  Intervention:   Discontinue Ensure Complete Continue Magic Cup once daily, provides 290 kcal and 9 grams protein  Nutrition Dx:   Predicted suboptimal energy intake related to hospitalization planned surgery as evidenced by pt's chart and family report that pt is eating less than usual; discontinued, pt eating well  Goal:   Pt to meet >/= 90% of their estimated nutrition needs; being met  Monitor:   PO intake; 75-100% of most meals Weight; 1 lb wt increase since admission Labs; High BUN, low GFR, high creatinine, low sodium, low hemoglobin  Assessment:   79 year old female presented after a mechanical fall at her home 12/15.  Pt underwent ORIF on 12/18.  Pt states that her appetite is good and she is eating much better now that her diet has been switched from heart healthy to regular. Pt reports eating 3 meals daily and finishing most of her meals. Per nursing notes pt is eating 75-100% of meals the past 2-3 days. Pt states she is not drinking the Ensure supplements but, she is eating the Magic Cup ice cream supplement daily.   Height: Ht Readings from Last 1 Encounters:  11/09/13 5\' 6"  (1.676 m)    Weight Status:   Wt Readings from Last 1 Encounters:  11/16/13 158 lb 15.2 oz (72.1 kg)    Re-estimated needs:  Kcal: 1560-1750 Protein: 75-85 grams Fluid: 1.8 L/day  Skin: +1 LLE edema, +1 perineal edema; left hip incision  Diet Order: General   Intake/Output Summary (Last 24 hours) at 11/16/13 1117 Last data filed at 11/16/13 1024  Gross per 24 hour  Intake    720 ml  Output   2020 ml  Net  -1300 ml    Last BM: 12/17   Labs:   Recent Labs Lab 11/10/13 0407 11/11/13 0354  11/13/13 2016 11/14/13 0520 11/15/13 0620 11/16/13 0455  NA 136 135  < >  --  132* 129* 134*  K 3.9 4.0  < >  --  4.7 4.8 4.6  CL 94* 95*  < >  --  93* 91* 95*  CO2 35* 35*  < >  --  33* 34* 33*  BUN 22 31*  < >  --  29* 35* 35*  CREATININE 1.43* 1.71*  < >  --   1.31* 1.47* 1.47*  CALCIUM 8.7 8.9  < >  --  8.5 8.5 8.4  MG  --  2.0  --  1.9  --   --   --   GLUCOSE 100* 135*  < >  --  108* 101* 95  < > = values in this interval not displayed.  CBG (last 3)  No results found for this basename: GLUCAP,  in the last 72 hours  Scheduled Meds: . acetaminophen  650 mg Oral Q6H  . antiseptic oral rinse  15 mL Mouth Rinse q12n4p  . aspirin EC  325 mg Oral BID  . chlorhexidine  15 mL Mouth Rinse BID  . docusate sodium  100 mg Oral BID  . feeding supplement (ENSURE COMPLETE)  237 mL Oral BID BM  . furosemide  40 mg Oral Daily  . levothyroxine  125 mcg Oral QAC breakfast  . metoprolol tartrate  12.5 mg Oral Q8H  . senna  1 tablet Oral BID  . sodium chloride  3 mL Intravenous Q12H    Continuous Infusions:   Ian Malkin RD, LDN Inpatient Clinical Dietitian Pager: [phone] After Hours Pager: [phone]

## 2013-11-16 NOTE — Progress Notes (Signed)
Patient transferred to [address] [place]. Received report from CSW [employer]. Patient for snf at [facility] home when medically stable. Willis Holquin C. Esker Dever MSW, LCSW [phone]

## 2013-11-16 NOTE — Progress Notes (Signed)
Patient working with physical therapy and oxygen removed during that time. Once patient was sitting in the chair, oxygen saturation checked on room air. Patient desats to 86% on room air. Oxygen at 1L applied via nasal cannula. Will reassess and continue to monitor.

## 2013-11-16 NOTE — Progress Notes (Signed)
Patient Name: Gina Wilkinson Date of Encounter: 11/16/2013     Active Problems:   HYPOTHYROIDISM   ATRIAL FIBRILLATION WITH RAPID VENTRICULAR RESPONSE   MDS (myelodysplastic syndrome)   Hip fracture   Acute diastolic CHF (congestive heart failure)   Chronic atrial fibrillation   RBBB   Cellulitis and abscess of leg   Acute respiratory failure with hypoxia    SUBJECTIVE  Patient denies chest pain or shortness of breath.  Still weak.  CURRENT MEDS . acetaminophen  650 mg Oral Q6H  . antiseptic oral rinse  15 mL Mouth Rinse q12n4p  . aspirin EC  325 mg Oral BID  . chlorhexidine  15 mL Mouth Rinse BID  . docusate sodium  100 mg Oral BID  . feeding supplement (ENSURE COMPLETE)  237 mL Oral BID BM  . levothyroxine  125 mcg Oral QAC breakfast  . metoprolol tartrate  12.5 mg Oral Q8H  . senna  1 tablet Oral BID  . sodium chloride  3 mL Intravenous Q12H    OBJECTIVE  Filed Vitals:   11/15/13 2036 11/15/13 2050 11/16/13 0430 11/16/13 0651  BP: 87/44 84/50 96/40  100/48  Pulse: 83  76 84  Temp: 97.8 F (36.6 C)  97.8 F (36.6 C)   TempSrc: Oral  Oral   Resp: 18  18   Height:      Weight:   158 lb 15.2 oz (72.1 kg)   SpO2: 94%  100%     Intake/Output Summary (Last 24 hours) at 11/16/13 0700 Last data filed at 11/16/13 0434  Gross per 24 hour  Intake    480 ml  Output   1600 ml  Net  -1120 ml   Filed Weights   11/14/13 0700 11/15/13 0415 11/16/13 0430  Weight: 159 lb (72.122 kg) 159 lb 13.3 oz (72.5 kg) 158 lb 15.2 oz (72.1 kg)    PHYSICAL EXAM  General: Pleasant, NAD. Neuro: Alert and oriented X 3. Moves all extremities spontaneously. Psych: Normal affect. HEENT:  Normal  Neck: Supple without bruits.  Jugular venous pressure is elevated Lungs:  Bilateral inspiratory rales Heart: RRR no s3, s4,   2/6 systolic murmur at apex and base.  No diastolic murmur Abdomen: Soft, non-tender, non-distended, BS + x 4.  Extremities: No clubbing, cyanosis or edema.  DP/PT/Radials 2+ and equal bilaterally.   Intake/Output Summary (Last 24 hours) at 11/16/13 0700 Last data filed at 11/16/13 0434  Gross per 24 hour  Intake    480 ml  Output   1600 ml  Net  -1120 ml    Accessory Clinical Findings  CBC  Recent Labs  11/14/13 0520 11/15/13 0620  WBC 7.5 7.3  HGB 8.7* 8.2*  HCT 26.3* 24.6*  MCV 94.3 93.5  PLT 164 199   Basic Metabolic Panel  Recent Labs  11/13/13 2016  11/15/13 0620 11/16/13 0455  NA  --   < > 129* 134*  K  --   < > 4.8 4.6  CL  --   < > 91* 95*  CO2  --   < > 34* 33*  GLUCOSE  --   < > 101* 95  BUN  --   < > 35* 35*  CREATININE  --   < > 1.47* 1.47*  CALCIUM  --   < > 8.5 8.4  MG 1.9  --   --   --   < > = values in this interval not displayed. Liver Function Tests No  results found for this basename: AST, ALT, ALKPHOS, BILITOT, PROT, ALBUMIN,  in the last 72 hours No results found for this basename: LIPASE, AMYLASE,  in the last 72 hours Cardiac Enzymes No results found for this basename: CKTOTAL, CKMB, CKMBINDEX, TROPONINI,  in the last 72 hours BNP No components found with this basename: POCBNP,  D-Dimer No results found for this basename: DDIMER,  in the last 72 hours Hemoglobin A1C No results found for this basename: HGBA1C,  in the last 72 hours Fasting Lipid Panel No results found for this basename: CHOL, HDL, LDLCALC, TRIG, CHOLHDL, LDLDIRECT,  in the last 72 hours Thyroid Function Tests No results found for this basename: TSH, T4TOTAL, FREET3, T3FREE, THYROIDAB,  in the last 72 hours  TELE  11/16/13:  Atrial fibrillation with controlled ventricular response - 78 , occasional PVCs  ECG   Radiology/Studies    Dg Chest Port 1 View  07-Dec-2013   CLINICAL DATA:  Congestive heart failure.  EXAM: PORTABLE CHEST - 1 VIEW  COMPARISON:  Chest x-ray 11/12/2013.  Chest x-ray 09/06/2012.  FINDINGS: Cardiomegaly with pulmonary vascular prominence present. Diffuse interstitial prominence with basilar and  right upper lobe alveolar infiltrates present. These findings are most consistent with congestive heart failure and pulmonary edema. Underlying pneumonia cannot be excluded. Right-sided pleural effusion is present with probable fluid in the fissures. No pneumothorax. No acute osseous abnormality.  IMPRESSION: Findings most consistent with congestive heart failure and pulmonary edema. Right pleural effusion. No significant interim change noted from prior exam. Underlying pneumonia particularly in the lung bases and right upper lobe cannot be excluded.   Electronically Signed   By: Maisie Fus  Register   On: December 07, 2013 07:22   Dg Chest Port 1 View  11/12/2013   CLINICAL DATA:  Postoperative study, patient with tachycardia and dyspnea abnormal chest exam  EXAM: PORTABLE CHEST - 1 VIEW  COMPARISON:  Preoperative study dated November 09, 2013.  FINDINGS: The lungs are well-expanded. The interstitial markings remain increased and are somewhat more conspicuous on the right peripherally. The right hemidiaphragm is obscured and there is partial obscuration of the left hemidiaphragm. The cardiopericardial silhouette is enlarged. The pulmonary vascularity is indistinct. There is a moderate-sized hiatal hernia -partially intrathoracic stomach which is not new.  IMPRESSION: The findings are consistent with congestive heart failure with pulmonary interstitial edema. Developing atelectasis or pneumonia in the right lung and at the left lung base is suspected. Continued follow-up films would be useful.   Electronically Signed   By: David  [place]   On: 11/12/2013 16:40    ASSESSMENT AND PLAN 79 yo female with history of diastolic CHF, MDS and permanent atrial fibrillation admitted after mechanical fall resulting in left proximal femur fracture     She was noted to be volume overloaded on exam when admitted. Normal EF and moderate-severe pulmonary HTN on echo. Diuresed well with several doses of Lasix. Now s/p left femur repair.  Pt developed atrial fibrillation with RVR post surgery.   1. Acute diastolic CHF: Preserved EF on echo but moderate to severe pulmonary HTN, moderate LVH.  Her chest xray shows significant changes suggestive of CHF.   Her PA pressure estimate at echo is 73.  Continue diuresis as tolerated ( creatinine is starting to increase slightly)   Her BP is a bit soft today.   Her weight is at her pre-op weight - I'm not sure how much more vigorously we can diurese her.    Breathing is OK for now.  Will resume her home dose of lasix 40 mg  and follow.  2. Chronic persistent atrial fibrillation: Rate is adequately controlled at present.   Blood pressure is still soft so unable to increase beta blocker yet back to home dose  3. Aortic valve stenosis: Mild to moderate by echo     Alvia Grove., MD, [facility] 11/16/2013, 7:09 AM Office - [phone] Pager [phone]

## 2013-11-16 NOTE — Progress Notes (Signed)
Subjective:  Patient reports pain as mild.  Slow with PT 2/2 medical issues.  Objective:   VITALS:   Filed Vitals:   11/15/13 2036 11/15/13 2050 11/16/13 0430 11/16/13 0651  BP: 87/44 84/50 96/40  100/48  Pulse: 83  76 84  Temp: 97.8 F (36.6 C)  97.8 F (36.6 C)   TempSrc: Oral  Oral   Resp: 18  18   Height:      Weight:   72.1 kg (158 lb 15.2 oz)   SpO2: 94%  100%     Neurologically intact Neurovascular intact Sensation intact distally Intact pulses distally Dorsiflexion/Plantar flexion intact Incision: dressing C/D/I and no drainage No cellulitis present Compartment soft   Lab Results  Component Value Date   WBC 7.3 11/15/2013   HGB 8.2* 11/15/2013   HCT 24.6* 11/15/2013   MCV 93.5 11/15/2013   PLT 199 11/15/2013     Assessment/Plan: 4 Days Post-Op   Problem List Items Addressed This Visit     Cardiovascular and Mediastinum   ATRIAL FIBRILLATION WITH RAPID VENTRICULAR RESPONSE   Relevant Medications      furosemide (LASIX) 40 MG tablet      lisinopril (PRINIVIL,ZESTRIL) 10 MG tablet      heparin injection 5,000 Units (Completed)      aspirin EC tablet 325 mg      diltiazem (CARDIZEM) 1 mg/mL load via infusion 3 mg (Completed)      amiodarone (NEXTERONE) 1.8 mg/mL load via infusion 150 mg (Completed)      amiodarone (NEXTERONE PREMIX) 360 mg/200 mL dextrose 360 MG/200ML IV infusion (Completed)      aspirin EC tablet      furosemide (LASIX) injection 40 mg (Completed)      metoprolol tartrate (LOPRESSOR) tablet 12.5 mg      furosemide (LASIX) tablet 40 mg (Start on 11/16/2013 10:00 AM)   Acute diastolic CHF (congestive heart failure)   Chronic atrial fibrillation   RBBB     Respiratory   Acute respiratory failure with hypoxia     Musculoskeletal and Integument   Hip fracture   Relevant Orders      Touch down weight bearing     Other   Cellulitis and abscess of leg    Other Visit Diagnoses   Closed left hip fracture, initial encounter    -   Primary    Relevant Orders       Touch down weight bearing    Elevated serum creatinine        Cellulitis of lower leg        Pulmonary hypertension           Up with PT/OT when able DVT ppx - SCDs, ambulation, aspirin BID TDWB left lower extremity Pain control Medical management per cards and hospitalist - appreciate assistance    Cheral Almas 11/16/2013, 7:57 AM [phone]

## 2013-11-17 DIAGNOSIS — I5031 Acute diastolic (congestive) heart failure: Secondary | ICD-10-CM | POA: Diagnosis not present

## 2013-11-17 DIAGNOSIS — I2789 Other specified pulmonary heart diseases: Secondary | ICD-10-CM | POA: Diagnosis not present

## 2013-11-17 DIAGNOSIS — L02419 Cutaneous abscess of limb, unspecified: Secondary | ICD-10-CM | POA: Diagnosis not present

## 2013-11-17 DIAGNOSIS — I4891 Unspecified atrial fibrillation: Secondary | ICD-10-CM | POA: Diagnosis not present

## 2013-11-17 DIAGNOSIS — S72009A Fracture of unspecified part of neck of unspecified femur, initial encounter for closed fracture: Secondary | ICD-10-CM | POA: Diagnosis not present

## 2013-11-17 LAB — BASIC METABOLIC PANEL
BUN: 36 mg/dL — ABNORMAL HIGH (ref 6–23)
Calcium: 8.6 mg/dL (ref 8.4–10.5)
Chloride: 96 mEq/L (ref 96–112)
Creatinine, Ser: 1.37 mg/dL — ABNORMAL HIGH (ref 0.50–1.10)
GFR calc Af Amer: 38 mL/min — ABNORMAL LOW (ref >90)
GFR calc non Af Amer: 33 mL/min — ABNORMAL LOW (ref >90)

## 2013-11-17 MED ORDER — METOPROLOL TARTRATE 12.5 MG HALF TABLET
12.5000 mg | ORAL_TABLET | Freq: Two times a day (BID) | ORAL | Status: DC
  Administered 2013-11-17 – 2013-11-18 (×2): 12.5 mg via ORAL
  Filled 2013-11-17 (×3): qty 1

## 2013-11-17 MED ORDER — ACETAMINOPHEN 325 MG PO TABS: 650.0000 mg | ORAL_TABLET | Freq: Four times a day (QID) | ORAL | Status: DC | PRN

## 2013-11-17 MED ORDER — DSS 100 MG PO CAPS: 100.0000 mg | ORAL_CAPSULE | Freq: Two times a day (BID) | ORAL | Status: DC

## 2013-11-17 MED ORDER — ACETAMINOPHEN 325 MG PO TABS: 650.0000 mg | ORAL_TABLET | Freq: Four times a day (QID) | ORAL | Status: DC

## 2013-11-17 MED ORDER — SENNA 8.6 MG PO TABS: 1.0000 | ORAL_TABLET | Freq: Two times a day (BID) | ORAL | Status: DC

## 2013-11-17 MED ORDER — METOPROLOL TARTRATE 12.5 MG HALF TABLET: 12.5000 mg | ORAL_TABLET | Freq: Two times a day (BID) | ORAL | Status: DC

## 2013-11-17 MED ORDER — POLYETHYLENE GLYCOL 3350 17 G PO PACK: 17.0000 g | PACK | Freq: Every day | ORAL | Status: DC | PRN

## 2013-11-17 NOTE — Progress Notes (Signed)
Physical Therapy Treatment Patient Details Name: Gina Wilkinson MRN: [id number] DOB: October 30, 1920 Today's Date: 11/17/2013 Time: 7253-6644 PT Time Calculation (min): 25 min  PT Assessment / Plan / Recommendation  History of Present Illness s/p ORIF L periprosthetic fx; Postoperatively, the patient went into A. fib and RVR with heart rate in the 150s. Anesthesia was at the bedside and gave her diltiazem 1 mg IV x3. The patient remained asymptomatic, her blood pressure remained stable in the low 100 systolic.    PT Comments   POD # 5 L ORIF 2nd fall/Fx.  Pt OOB in recliner with daughter in room.  Performed TE's L LE then assisted out of recliner to [facility] to void then assisted with amb limited distance 4 feet.  Positioned in recliner then applied ICE.  Pt progressing slowly and plans to D/C to SNF for Rehab.   Follow Up Recommendations  SNF [facility])     Does the patient have the potential to tolerate intense rehabilitation     Barriers to Discharge        Equipment Recommendations       Recommendations for Other Services    Frequency Min 3X/week   Progress towards PT Goals Progress towards PT goals: Progressing toward goals  Plan      Precautions / Restrictions Precautions Precautions: Fall Restrictions Weight Bearing Restrictions: Yes LLE Weight Bearing: Touchdown weight bearing    Pertinent Vitals/Pain C/o "some" pain ICE applied Pre medicated    Mobility  Bed Mobility Bed Mobility: Not assessed Details for Bed Mobility Assistance: Pt OOB in recliner Transfers Transfers: Sit to Stand;Stand to Sit Sit to Stand: 1: +2 Total assist;From toilet;From chair/3-in-1 Sit to Stand: Patient Percentage: 40% Stand to Sit: 1: +2 Total assist;To chair/3-in-1;To toilet Stand to Sit: Patient Percentage: 40% Details for Transfer Assistance: + 2 assist with 75% VC's on proper tech and hand placment each time performed.  Instructed on TTWB and turn completion prior to sit. Assisted  from recliner to [facility] then to standing for amb.  Ambulation/Gait Ambulation/Gait Assistance: 1: +2 Total assist Ambulation/Gait: Patient Percentage: 40% Ambulation Distance (Feet): 4 Feet Assistive device: Rolling walker Ambulation/Gait Assistance Details: 75% VC's on proper sequencing and to increase upper extremity support on RW to maintain TTWB thru L LE.   Gait Pattern: Step-to pattern Gait velocity: decreased    Exercises   Hip  TE's 10 reps ankle pumps 10 reps knee presses 10 reps heel slides 10 reps SAQ's 10 reps ABD Followed by ICE    PT Goals (current goals can now be found in the care plan section)    Visit Information  Last PT Received On: 11/17/13 Assistance Needed: +2 History of Present Illness: s/p ORIF L periprosthetic fx; Postoperatively, the patient went into A. fib and RVR with heart rate in the 150s. Anesthesia was at the bedside and gave her diltiazem 1 mg IV x3. The patient remained asymptomatic, her blood pressure remained stable in the low 100 systolic.     Subjective Data      Cognition       Balance     End of Session PT - End of Session Equipment Utilized During Treatment: Gait belt Activity Tolerance: Patient limited by fatigue;Patient limited by pain Patient left: in chair;with call bell/phone within reach;with family/visitor present   Felecia Shelling  PTA [facility]  Acute  Rehab Pager      [phone]

## 2013-11-17 NOTE — Discharge Summary (Signed)
Physician Discharge Summary  SONDA COPPENS ZOX:[id number] DOB: February 08, 1920 DOA: 11/09/2013  PCP: Cassell Clement, MD  Admit date: 11/09/2013 Discharge date: pending  Recommendations for Outpatient Follow-up:  1. Transfer to SNF for ongoing PT/OT  2. Follow up with orthopedics in 2 weeks for follow up of left hip repair 3. Follow up with cardiology in 2 weeks for follow up diastolic heart failure, either previous cardiologist at [facility] in [place] or [facility] Dr. Ladona Ridgel.   4. Weekly BMP to be sent to PCP for monitoring of potassium and creatinine while on diuretics 5. Okay to remove surgical dressing 12/26.   6. M-W-F dressing changes with calcium alginate dressing topped with soft silicone foam dressing to RLE wound.   Discharge Diagnoses:  Active Problems:   HYPOTHYROIDISM   ATRIAL FIBRILLATION WITH RAPID VENTRICULAR RESPONSE   MDS (myelodysplastic syndrome)   Hip fracture   Acute diastolic CHF (congestive heart failure)   Chronic atrial fibrillation   RBBB   Cellulitis and abscess of leg   Acute respiratory failure with hypoxia   Discharge Condition: stable, improved  Diet recommendation: regular until eating well, then consider transitioning to low sodium  Wt Readings from Last 3 Encounters:  11/17/13 73.4 kg (161 lb 13.1 oz)  11/17/13 73.4 kg (161 lb 13.1 oz)  08/07/13 66.951 kg (147 lb 9.6 oz)    History of present illness:   79 year old female presented after a mechanical fall at her home today. Her daughter tried to call her on the telephone, but there was no answer. A result, another daughter came to check on her and found the patient on the bathroom floor. There was no syncope. The patient has mild cognitive impairment, and this history is supplemented by the patient's daughter at the bedside. X-ray of the left hip in ED showed a left periprosthetic proximal femur fracture. Orthopedics, Dr. Cleophas Dunker was called.   However, further history reveals that  the patient has had increasing lower extremity edema and shortness of breath for approximately 6 days prior to this admission. The patient went to see her primary care doctor on 11/03/2013. The patient was placed on furosemide 40 mg daily. There has been some improvement of the patient's dyspnea and lower extremity edema. Echocardiogram was obtained on 11/05/2013. It showed E. of 60-65%, mild to moderate aortic stenosis, and moderate to severe pulmonary hypertension. The patient has had improvement in her breathing and lower extremity edema. However, she continues to have some dyspnea. She denies any chest pain, fevers, chills, coughing, hemoptysis, nausea, vomiting, diarrhea, dysuria and hematuria, abdominal pain. In addition, the patient has been on cephalexin for approximately 5 days because of the chronic right lower extremity pretibial ulceration that has not been healing well. There was concern for infection.   In the emergency department, the patient was given fluids at 125 cc an hour. The patient was hemodynamically stable without any respiratory distress. At the time of my evaluation and dictation, and serum sodium, potassium, and chloride are pending. Serum creatinine 1.6. CBC showed hemoglobin 10.2.   Hospital Course:   Acute hypoxic respiratory failure due to acute on Chronic diastolic CHF with pulmonary edema on chest x-ray.  At the time of admission, she was volume overloaded and mildly hypotensive.  Her echocardiogram demonstrated an ejection fraction of 60-65% with moderate to severe pulmonary hypertension. Cardiology was consult in and her metoprolol was discontinued due to low blood pressure. She was gently diuresis with Lasix. She was placed on a fluid  restriction. Her troponins were negative and her telemetry remained in atrial fibrillation with controlled rate. Her heart failure gradually improved.  On December 18 she was taken for fixation of her right proximal femur fracture, and  postoperatively she developed atrial fibrillation with RVR and acute diastolic heart failure. The next several days she had high do this beta blocker which was gradually tapered, and as her blood pressure tolerated she was restarted on Lasix. Lasix 40 mg IV cause her to become hypotensive to the high 70s systolic tissue is transitioning back to oral Lasix and had very slow diuresis over several days. During this time she had acute hypoxic respiratory failure and, per cardiology it is felt that she will likely have ongoing hypoxic respiratory failure due to her heart failure for the indefinite future.  Her chest x-ray demonstrated bilateral pleural effusions and some pulmonary edema which were marginally better by the time of discharge.  She will need weekly BNP done to check her electrolytes and her creatinine which can be sent to her primary doctor or the supervising physician. She will need cardiology followup in 2-3 weeks.  Right proximal femur fracture status post ORIF on 12/18.  Initially tolerated her procedure well and had a drain left in place postoperatively which was removed a few days later. She did not have significant swelling, ecchymoses, or hematoma formation.  She was advised to do touchdown weightbearing on the left leg, and she will need outpatient followup with orthopedic surgery in approximately 2-3 weeks. She had very slow progression with physical and occupational therapy due to inability his place weight on her left leg and weakness from her prolonged hospitalization and diastolic heart failure. She was advised to go to a skilled nursing facility for ongoing rehabilitation per physical and occupational therapy.  She will continue aspirin for DVT prophylaxis.  Atrial fibrillation with RVR post-operatively with HR in the 140s to 150s, asymptomatic except for the development of recurrent acute diastolic heart failure.  Metoprolol restarted TID, however, due to hypotension, her dose was  gradually decreased.  Her HR has generally stayed in the 90s.  Will monitor HR and BP on current dose of metoprolol and lasix and if both remain stable x 24 hours, plan to discharge to SNF on these doses and with close follow up.  TSH 1.37.  Continue aspirin. Not a warfarin candidate due to myelodysplastic syndrome.   Chronic kidney disease stage III, BUN and creatinine stable.   Cellulitis of bilateral lower extremities. Resolved. Completed 7 days of vancomycin on 12/21.  She was seen by wound care who recommended M-W-F dressing changes with calcium alginate dressing topped with soft silicone foam dressing to RLE wound.  Please monitor for improvement and could taper to twice weekly dressing changes at that time.  Blood cultures no growth to date.    Myelodysplastic syndrome Patient usually follows Dr. Cyndie Chime. Getting intermittent Aranesp intermittently as outpatient. Hgb trended down slightly during admission, likely due to anemia of chronic disease and some blood loss.  Given aranesp once on 12/21.  Iron studies consistent with chronic disease, folate pending, vitamin B12 wnl a month ago.    Hypothyroidism , stable, continue Synthroid   Hyponatremia, likely secondary to acute CHF exacerbation and improved with diuresis   Poor appetite postoperatively (moderate protein cal malnutrition), liberalize diet and add supplements.  Nutrition consulted and recommended magic cup once daily in addition to regular diet (possible with low sodium)   Consultants:  Cardiology Dr. Clifton James  Orthopedics  Dr. Roda Shutters Procedures:  XR femur  CXR  ECHO Antibiotics:  Vancomycin 12/15 >> 12/21  Discharge Exam: Filed Vitals:   11/17/13 0455  BP: 98/41  Pulse: 72  Temp: 98.2 F (36.8 C)  Resp: 16   Filed Vitals:   11/16/13 1341 11/16/13 1356 11/16/13 2008 11/17/13 0455  BP: 95/40 89/49 88/42  98/41  Pulse: 76 75 80 72  Temp:  97.4 F (36.3 C) 98.1 F (36.7 C) 98.2 F (36.8 C)  TempSrc:  Oral  Oral Oral  Resp:  16 16 16   Height:      Weight:    73.4 kg (161 lb 13.1 oz)  SpO2:  96% 95% 94%    General: Caucasian female, No acute distress, nasal cannula in place  HEENT: NCAT, MMM  Cardiovascular: IRRR heart rate in the 90s, nl S1, S2, 3/6 systolic murmur at the left sternal border, 2+ pulses, warm extremities Respiratory:  Faint rales at bases, no wheezes or rhonchi, no increased WOB  Abdomen: NABS, soft, NT/ND  MSK: Normal tone and bulk, trace LEE, left hip dressing with trace amount of blood, no hematoma, minimal ecchymoses  Neuro: Grossly intact   Discharge Instructions      Discharge Orders   Future Appointments Provider Department Dept Phone   11/27/2013 11:00 AM [facility] [place] [facility] [phone]   11/27/2013 11:30 AM [facility] Nurse [place] [facility] [phone]   12/07/2013 10:15 AM Lenn Sink, DPM [facility] [phone]   12/25/2013 3:15 PM [facility] [place] [facility] [phone]   12/25/2013 3:45 PM [facility] Nurse [place] [facility] [phone]   01/22/2014 10:00 AM [facility] [place] [facility] [phone]   01/22/2014 10:30 AM [facility] Nurse [place] [facility] [phone]   02/19/2014 10:15 AM [facility] [place] [facility] [phone]   02/19/2014 10:45 AM Rana Snare, NP [place] [facility] [phone]   02/19/2014 11:15 AM [facility] [phone]   Future Orders Complete By Expires   Touch down weight bearing  As directed    Questions:     Laterality:  left   Extremity:  Lower   Touch down weight bearing  As directed    Questions:     Laterality:     Extremity:         Medication List    STOP taking these medications        aspirin 81 MG tablet  Replaced by:  aspirin EC 325 MG tablet     lisinopril 10 MG tablet  Commonly known as:  PRINIVIL,ZESTRIL     metoprolol succinate 100 MG 24 hr tablet  Commonly known as:  TOPROL-XL      TAKE these medications       acetaminophen 325 MG tablet  Commonly known as:  TYLENOL  Take 2 tablets (650 mg total) by mouth every 6 (six) hours as needed for mild pain, moderate pain, fever or headache.     aspirin EC 325 MG tablet  Take 1 tablet (325 mg total) by mouth 2 (two) times daily.     DSS 100 MG Caps  Take 100 mg by mouth 2 (two) times daily.     furosemide 40 MG tablet  Commonly known as:  LASIX  Take 40 mg by mouth daily.  HYDROcodone-acetaminophen 5-325 MG per tablet  Commonly known as:  NORCO  Take 1-2 tablets by mouth every 6 (six) hours as needed.     levothyroxine 125 MCG tablet  Commonly known as:  SYNTHROID, LEVOTHROID  Take 125 mcg by mouth daily.     metoprolol tartrate 12.5 mg Tabs tablet  Commonly known as:  LOPRESSOR  Take 0.5 tablets (12.5 mg total) by mouth 2 (two) times daily.     polyethylene glycol packet  Commonly known as:  MIRALAX / GLYCOLAX  Take 17 g by mouth daily as needed for mild constipation.     senna 8.6 MG Tabs tablet  Commonly known as:  SENOKOT  Take 1 tablet (8.6 mg total) by mouth 2 (two) times daily.       Follow-up Information   Follow up with Cheral Almas, MD On 11/27/2013.   Specialty:  Orthopedic Surgery   Contact information:   [address] [place] [address] [phone]       Follow up with Cassell Clement, MD. Schedule an appointment as soon as possible for a visit in 2 weeks.   Specialty:  Cardiology   Contact information:   [address] [place] [address] [phone]        The results of significant diagnostics from this hospitalization (including imaging, microbiology, ancillary and laboratory) are listed below for reference.    Significant  Diagnostic Studies: Dg Chest 1 View  11/09/2013   CLINICAL DATA:  Left hip fracture. Recent shortness of breath and coughing. A preop for hip surgery.  EXAM: CHEST - 1 VIEW  COMPARISON:  Two-view chest 09/06/2012. CT chest with contrast 03/16/2009.  FINDINGS: The heart is enlarged. Edematous changes are superimposed on chronic interstitial coarsening. A large hiatal hernia is present. Chronic prominence of the right hila is stable. Atherosclerotic calcifications are present at the aortic arch.  IMPRESSION: 1. New interstitial airspace disease representing edema superimposed on chronic change. 2. Stable cardiomegaly. 3. Stable large hiatal hernia.   Electronically Signed   By: Gennette Pac M.D.   On: 11/09/2013 14:53   Dg Hip Complete Left  11/09/2013   CLINICAL DATA:  Fall, pain.  EXAM: LEFT HIP - COMPLETE 2+ VIEW  COMPARISON:  None.  FINDINGS: There is a periprosthetic proximal femur fracture along the medial and distal aspect of the left total hip replacement femoral component. Slight angulation anteriorly.  IMPRESSION: Left periprosthetic proximal femur fracture.   Electronically Signed   By: Davonna Belling M.D.   On: 11/09/2013 13:32   Dg Femur Left  11/12/2013   CLINICAL DATA:  Fracture fixation  EXAM: LEFT FEMUR - 2 VIEW  COMPARISON:  11/09/2013  FINDINGS: Femoral fracture at the level of the distal femoral stem has been fixed with a long lateral plate and multiple screws. Three cerclage wires are present around the proximal femur. Fracture alignment in satisfactory position. Left hip hemiarthroplasty again noted.  IMPRESSION: Plate and screw fixation femur fracture.   Electronically Signed   By: Marlan Palau M.D.   On: 11/12/2013 16:02   Dg Chest Port 1 View  11/16/2013   CLINICAL DATA:  Heart failure.  EXAM: PORTABLE CHEST - 1 VIEW  COMPARISON:  11/13/2013.  FINDINGS: Persistent severe cardiomegaly. Mild pulmonary venous congestion. Previously identified pulmonary interstitial edema and  pleural effusions have improved slightly. No new infiltrates noted. No evidence of pneumothorax. No acute osseous abnormality .  IMPRESSION: 1. Severe cardiomegaly. 2. Mild improvement of pulmonary  interstitial edema and pleural effusions. Persistent interstitial edema and pleural effusions remain.   Electronically Signed   By: Maisie Fus  Register   On: 11/16/2013 07:30   Dg Chest Port 1 View  11/13/2013   CLINICAL DATA:  Congestive heart failure.  EXAM: PORTABLE CHEST - 1 VIEW  COMPARISON:  Chest x-ray 11/12/2013.  Chest x-ray 09/06/2012.  FINDINGS: Cardiomegaly with pulmonary vascular prominence present. Diffuse interstitial prominence with basilar and right upper lobe alveolar infiltrates present. These findings are most consistent with congestive heart failure and pulmonary edema. Underlying pneumonia cannot be excluded. Right-sided pleural effusion is present with probable fluid in the fissures. No pneumothorax. No acute osseous abnormality.  IMPRESSION: Findings most consistent with congestive heart failure and pulmonary edema. Right pleural effusion. No significant interim change noted from prior exam. Underlying pneumonia particularly in the lung bases and right upper lobe cannot be excluded.   Electronically Signed   By: Maisie Fus  Register   On: 11/13/2013 07:22   Dg Chest Port 1 View  11/12/2013   CLINICAL DATA:  Postoperative study, patient with tachycardia and dyspnea abnormal chest exam  EXAM: PORTABLE CHEST - 1 VIEW  COMPARISON:  Preoperative study dated November 09, 2013.  FINDINGS: The lungs are well-expanded. The interstitial markings remain increased and are somewhat more conspicuous on the right peripherally. The right hemidiaphragm is obscured and there is partial obscuration of the left hemidiaphragm. The cardiopericardial silhouette is enlarged. The pulmonary vascularity is indistinct. There is a moderate-sized hiatal hernia -partially intrathoracic stomach which is not new.  IMPRESSION: The  findings are consistent with congestive heart failure with pulmonary interstitial edema. Developing atelectasis or pneumonia in the right lung and at the left lung base is suspected. Continued follow-up films would be useful.   Electronically Signed   By: David  [place]   On: 11/12/2013 16:40   Dg Femur Left Port  11/12/2013   CLINICAL DATA:  Left femur fracture fixation.  EXAM: PORTABLE LEFT FEMUR - 2 VIEW  FINDINGS: There is a long plate, multiple screws and cerclage wires transfixing the periprosthetic femur fracture. No complicating features are demonstrated.  IMPRESSION: Long plate with screws and cerclage wires transfixing the periprosthetic femur fracture.   Electronically Signed   By: Loralie Champagne M.D.   On: 11/12/2013 16:39   Dg C-arm 61-120 Min-no Report  11/12/2013   CLINICAL DATA: left hip fracture   C-ARM 61-120 MINUTES  Fluoroscopy was utilized by the requesting physician.  No radiographic  interpretation.     Microbiology: Recent Results (from the past 240 hour(s))  CULTURE, BLOOD (ROUTINE X 2)     Status: None   Collection Time    11/09/13  5:11 PM      Result Value Range Status   Specimen Description BLOOD LEFT ARM   Final   Special Requests BOTTLES DRAWN AEROBIC ONLY 3CC   Final   Culture  Setup Time     Final   Value: 11/10/2013 00:18     Performed at [facility]   Culture     Final   Value: NO GROWTH 5 DAYS     Performed at [facility]   Report Status 11/16/2013 FINAL   Final  CULTURE, BLOOD (ROUTINE X 2)     Status: None   Collection Time    11/09/13  5:25 PM      Result Value Range Status   Specimen Description BLOOD RIGHT HAND   Final   Special Requests BOTTLES DRAWN AEROBIC  ONLY 3CC   Final   Culture  Setup Time     Final   Value: 11/10/2013 00:18     Performed at [facility]   Culture     Final   Value: NO GROWTH 5 DAYS     Performed at [facility]   Report Status 11/16/2013 FINAL   Final  SURGICAL PCR SCREEN      Status: None   Collection Time    11/11/13  3:39 PM      Result Value Range Status   MRSA, PCR NEGATIVE  NEGATIVE Final   Staphylococcus aureus NEGATIVE  NEGATIVE Final   Comment:            The Xpert SA Assay (FDA     approved for NASAL specimens     in patients over 73 years of age),     is one component of     a comprehensive surveillance     program.  Test performance has     been validated by [facility] for patients greater     than or equal to 52 year old.     It is not intended     to diagnose infection nor to     guide or monitor treatment.     Labs: Basic Metabolic Panel:  Recent Labs Lab 11/11/13 0354  11/13/13 0358 11/13/13 2016 11/14/13 0520 11/15/13 0620 11/16/13 0455 11/17/13 0453  NA 135  < > 135  --  132* 129* 134* 134*  K 4.0  < > 4.9  --  4.7 4.8 4.6 4.4  CL 95*  < > 96  --  93* 91* 95* 96  CO2 35*  < > 32  --  33* 34* 33* 30  GLUCOSE 135*  < > 143*  --  108* 101* 95 92  BUN 31*  < > 28*  --  29* 35* 35* 36*  CREATININE 1.71*  < > 1.32*  --  1.31* 1.47* 1.47* 1.37*  CALCIUM 8.9  < > 8.4  --  8.5 8.5 8.4 8.6  MG 2.0  --   --  1.9  --   --   --   --   < > = values in this interval not displayed. Liver Function Tests: No results found for this basename: AST, ALT, ALKPHOS, BILITOT, PROT, ALBUMIN,  in the last 168 hours No results found for this basename: LIPASE, AMYLASE,  in the last 168 hours No results found for this basename: AMMONIA,  in the last 168 hours CBC:  Recent Labs Lab 11/12/13 0400 11/12/13 1628 11/13/13 0358 11/14/13 0520 11/15/13 0620  WBC 7.0 10.3 5.9 7.5 7.3  HGB 8.5* 10.3* 9.0* 8.7* 8.2*  HCT 26.7* 32.0* 28.3* 26.3* 24.6*  MCV 96.0 96.1 95.9 94.3 93.5  PLT 190 209 185 164 199   Cardiac Enzymes:  Recent Labs Lab 11/12/13 1628 11/12/13 2032 11/13/13 0358  TROPONINI <0.30 <0.30 <0.30   BNP: BNP (last 3 results)  Recent Labs  11/09/13 1721  PROBNP 5281.0*   CBG: No results found for this basename:  GLUCAP,  in the last 168 hours  Time coordinating discharge: 45 minutes  Signed:  Evangela Heffler  [facility] 11/17/2013, 11:55 AM

## 2013-11-17 NOTE — Progress Notes (Signed)
Patient Name: Gina Wilkinson Date of Encounter: 11/17/2013     Active Problems:   HYPOTHYROIDISM   ATRIAL FIBRILLATION WITH RAPID VENTRICULAR RESPONSE   MDS (myelodysplastic syndrome)   Hip fracture   Acute diastolic CHF (congestive heart failure)   Chronic atrial fibrillation   RBBB   Cellulitis and abscess of leg   Acute respiratory failure with hypoxia    SUBJECTIVE  Patient denies chest pain or shortness of breath.  Still weak.  CURRENT MEDS . acetaminophen  650 mg Oral Q6H  . antiseptic oral rinse  15 mL Mouth Rinse q12n4p  . aspirin EC  325 mg Oral BID  . chlorhexidine  15 mL Mouth Rinse BID  . docusate sodium  100 mg Oral BID  . furosemide  40 mg Oral Daily  . levothyroxine  125 mcg Oral QAC breakfast  . metoprolol tartrate  12.5 mg Oral BID  . senna  1 tablet Oral BID  . sodium chloride  3 mL Intravenous Q12H    OBJECTIVE  Filed Vitals:   11/16/13 1341 11/16/13 1356 11/16/13 2008 11/17/13 0455  BP: 95/40 89/49 88/42  98/41  Pulse: 76 75 80 72  Temp:  97.4 F (36.3 C) 98.1 F (36.7 C) 98.2 F (36.8 C)  TempSrc:  Oral Oral Oral  Resp:  16 16 16   Height:      Weight:    161 lb 13.1 oz (73.4 kg)  SpO2:  96% 95% 94%    Intake/Output Summary (Last 24 hours) at 11/17/13 0643 Last data filed at 11/17/13 0230  Gross per 24 hour  Intake    483 ml  Output   1735 ml  Net  -1252 ml   Filed Weights   11/15/13 0415 11/16/13 0430 11/17/13 0455  Weight: 159 lb 13.3 oz (72.5 kg) 158 lb 15.2 oz (72.1 kg) 161 lb 13.1 oz (73.4 kg)    PHYSICAL EXAM  General: Pleasant, NAD.  HEENT:  Normal  Neck: Supple without bruits.  Jugular venous pressure is elevated Lungs:  Bilateral inspiratory rales Heart: RRR no s3, s4,   2/6 systolic murmur at apex and base.  No diastolic murmur Abdomen: Soft, non-tender, non-distended, BS + x 4.  Extremities: No clubbing, cyanosis or edema. DP/PT/Radials 2+ and equal bilaterally. Neuro: Alert and oriented X 3. Moves all  extremities spontaneously. Psych: Normal affect.   Intake/Output Summary (Last 24 hours) at 11/17/13 0643 Last data filed at 11/17/13 0230  Gross per 24 hour  Intake    483 ml  Output   1735 ml  Net  -1252 ml    Accessory Clinical Findings  CBC  Recent Labs  11/15/13 0620  WBC 7.3  HGB 8.2*  HCT 24.6*  MCV 93.5  PLT 199   Basic Metabolic Panel  Recent Labs  11/15/13 0620 11/16/13 0455  NA 129* 134*  K 4.8 4.6  CL 91* 95*  CO2 34* 33*  GLUCOSE 101* 95  BUN 35* 35*  CREATININE 1.47* 1.47*  CALCIUM 8.5 8.4   Liver Function Tests No results found for this basename: AST, ALT, ALKPHOS, BILITOT, PROT, ALBUMIN,  in the last 72 hours No results found for this basename: LIPASE, AMYLASE,  in the last 72 hours Cardiac Enzymes No results found for this basename: CKTOTAL, CKMB, CKMBINDEX, TROPONINI,  in the last 72 hours BNP No components found with this basename: POCBNP,  D-Dimer No results found for this basename: DDIMER,  in the last 72 hours Hemoglobin A1C No results  found for this basename: HGBA1C,  in the last 72 hours Fasting Lipid Panel No results found for this basename: CHOL, HDL, LDLCALC, TRIG, CHOLHDL, LDLDIRECT,  in the last 72 hours Thyroid Function Tests No results found for this basename: TSH, T4TOTAL, FREET3, T3FREE, THYROIDAB,  in the last 72 hours  TELE  11/16/13:  Atrial fibrillation with controlled ventricular response - 78 , occasional PVCs  ECG   Radiology/Studies    Dg Chest Port 1 View  12-01-2013   CLINICAL DATA:  Congestive heart failure.  EXAM: PORTABLE CHEST - 1 VIEW  COMPARISON:  Chest x-ray 11/12/2013.  Chest x-ray 09/06/2012.  FINDINGS: Cardiomegaly with pulmonary vascular prominence present. Diffuse interstitial prominence with basilar and right upper lobe alveolar infiltrates present. These findings are most consistent with congestive heart failure and pulmonary edema. Underlying pneumonia cannot be excluded. Right-sided pleural  effusion is present with probable fluid in the fissures. No pneumothorax. No acute osseous abnormality.  IMPRESSION: Findings most consistent with congestive heart failure and pulmonary edema. Right pleural effusion. No significant interim change noted from prior exam. Underlying pneumonia particularly in the lung bases and right upper lobe cannot be excluded.   Electronically Signed   By: Maisie Fus  Register   On: 01-Dec-2013 07:22   Dg Chest Port 1 View  11/12/2013   CLINICAL DATA:  Postoperative study, patient with tachycardia and dyspnea abnormal chest exam  EXAM: PORTABLE CHEST - 1 VIEW  COMPARISON:  Preoperative study dated November 09, 2013.  FINDINGS: The lungs are well-expanded. The interstitial markings remain increased and are somewhat more conspicuous on the right peripherally. The right hemidiaphragm is obscured and there is partial obscuration of the left hemidiaphragm. The cardiopericardial silhouette is enlarged. The pulmonary vascularity is indistinct. There is a moderate-sized hiatal hernia -partially intrathoracic stomach which is not new.  IMPRESSION: The findings are consistent with congestive heart failure with pulmonary interstitial edema. Developing atelectasis or pneumonia in the right lung and at the left lung base is suspected. Continued follow-up films would be useful.   Electronically Signed   By: David  [place]   On: 11/12/2013 16:40    ASSESSMENT AND PLAN 79 yo female with history of diastolic CHF, MDS and permanent atrial fibrillation admitted after mechanical fall resulting in left proximal femur fracture     She was noted to be volume overloaded on exam when admitted. Normal EF and moderate-severe pulmonary HTN on echo. Diuresed well with several doses of Lasix. Now s/p left femur repair. Pt developed atrial fibrillation with RVR post surgery.   1. Acute diastolic CHF: Preserved EF on echo but moderate to severe pulmonary HTN, moderate LVH.  Her chest xray shows significant  changes suggestive of CHF.   Her PA pressure estimate at echo is 73.  Continue diuresis as tolerated ( creatinine is starting to increase slightly)   She has continued to do well on PO lasix.  Very comfortable lying supine in bed.    2. Chronic persistent atrial fibrillation:  She has not been on anticoagulation because of her risk of falling and her myelodysplastic disease.  Rate is adequately controlled at present.   Continue current meds.  3. Aortic valve stenosis: Mild to moderate by echo     Alvia Grove., MD, [facility] 11/17/2013, 6:43 AM Office - [phone] Pager [phone]

## 2013-11-17 NOTE — Progress Notes (Signed)
Patient still not medically stable for discharge. Will continue to follow for discharge to [facility] home.  Anamika Kueker C. Chaslyn Eisen MSW, LCSW [phone]

## 2013-11-18 DIAGNOSIS — I4891 Unspecified atrial fibrillation: Secondary | ICD-10-CM | POA: Diagnosis not present

## 2013-11-18 DIAGNOSIS — S7290XD Unspecified fracture of unspecified femur, subsequent encounter for closed fracture with routine healing: Secondary | ICD-10-CM | POA: Diagnosis not present

## 2013-11-18 DIAGNOSIS — I359 Nonrheumatic aortic valve disorder, unspecified: Secondary | ICD-10-CM | POA: Diagnosis not present

## 2013-11-18 DIAGNOSIS — Z5189 Encounter for other specified aftercare: Secondary | ICD-10-CM | POA: Diagnosis not present

## 2013-11-18 DIAGNOSIS — J9600 Acute respiratory failure, unspecified whether with hypoxia or hypercapnia: Secondary | ICD-10-CM | POA: Diagnosis not present

## 2013-11-18 DIAGNOSIS — I4510 Unspecified right bundle-branch block: Secondary | ICD-10-CM | POA: Diagnosis not present

## 2013-11-18 DIAGNOSIS — S72009D Fracture of unspecified part of neck of unspecified femur, subsequent encounter for closed fracture with routine healing: Secondary | ICD-10-CM | POA: Diagnosis not present

## 2013-11-18 DIAGNOSIS — S72009A Fracture of unspecified part of neck of unspecified femur, initial encounter for closed fracture: Secondary | ICD-10-CM | POA: Diagnosis not present

## 2013-11-18 DIAGNOSIS — I5032 Chronic diastolic (congestive) heart failure: Secondary | ICD-10-CM | POA: Diagnosis not present

## 2013-11-18 DIAGNOSIS — I509 Heart failure, unspecified: Secondary | ICD-10-CM | POA: Diagnosis not present

## 2013-11-18 DIAGNOSIS — Z9181 History of falling: Secondary | ICD-10-CM | POA: Diagnosis not present

## 2013-11-18 DIAGNOSIS — D469 Myelodysplastic syndrome, unspecified: Secondary | ICD-10-CM | POA: Diagnosis not present

## 2013-11-18 DIAGNOSIS — M25559 Pain in unspecified hip: Secondary | ICD-10-CM | POA: Diagnosis not present

## 2013-11-18 DIAGNOSIS — M6281 Muscle weakness (generalized): Secondary | ICD-10-CM | POA: Diagnosis not present

## 2013-11-18 DIAGNOSIS — S72309B Unspecified fracture of shaft of unspecified femur, initial encounter for open fracture type I or II: Secondary | ICD-10-CM | POA: Diagnosis not present

## 2013-11-18 DIAGNOSIS — R269 Unspecified abnormalities of gait and mobility: Secondary | ICD-10-CM | POA: Diagnosis not present

## 2013-11-18 DIAGNOSIS — R059 Cough, unspecified: Secondary | ICD-10-CM | POA: Diagnosis not present

## 2013-11-18 DIAGNOSIS — E8779 Other fluid overload: Secondary | ICD-10-CM | POA: Diagnosis not present

## 2013-11-18 DIAGNOSIS — D649 Anemia, unspecified: Secondary | ICD-10-CM | POA: Diagnosis not present

## 2013-11-18 DIAGNOSIS — I5031 Acute diastolic (congestive) heart failure: Secondary | ICD-10-CM | POA: Diagnosis not present

## 2013-11-18 DIAGNOSIS — I2789 Other specified pulmonary heart diseases: Secondary | ICD-10-CM | POA: Diagnosis not present

## 2013-11-18 DIAGNOSIS — I10 Essential (primary) hypertension: Secondary | ICD-10-CM | POA: Diagnosis not present

## 2013-11-18 LAB — BASIC METABOLIC PANEL
Calcium: 8.9 mg/dL (ref 8.4–10.5)
Chloride: 96 mEq/L (ref 96–112)
Creatinine, Ser: 1.33 mg/dL — ABNORMAL HIGH (ref 0.50–1.10)
GFR calc Af Amer: 39 mL/min — ABNORMAL LOW (ref >90)
Glucose, Bld: 98 mg/dL (ref 70–99)
Potassium: 4.3 mEq/L (ref 3.5–5.1)
Sodium: 136 mEq/L (ref 135–145)

## 2013-11-18 MED ORDER — SENNOSIDES-DOCUSATE SODIUM 8.6-50 MG PO TABS: 2.0000 | ORAL_TABLET | Freq: Two times a day (BID) | ORAL | Status: DC

## 2013-11-18 MED ORDER — FLEET ENEMA 7-19 GM/118ML RE ENEM
1.0000 | ENEMA | Freq: Once | RECTAL | Status: AC
  Administered 2013-11-18: 1 via RECTAL
  Filled 2013-11-18: qty 1

## 2013-11-18 NOTE — Progress Notes (Signed)
Patient is set to discharge to [facility] today. Patient & daughter, [name] at bedside aware. Discharge packet in [place]. RN, Kendal Hymen aware & will call [facility] (ph#: [phone]) for transport when ready.   Clinical Social Work Department CLINICAL SOCIAL WORK PLACEMENT NOTE 11/18/2013  Patient:  Gina Wilkinson, Gina Wilkinson  Account Number:  [id number] Admit date:  11/09/2013  Clinical Social Worker:  Orpah Greek  Date/time:  11/11/2013 03:38 PM  Clinical Social Work is seeking post-discharge placement for this patient at the following level of care:   SKILLED NURSING   (*CSW will update this form in Epic as items are completed)   11/11/2013  Patient/family provided with [facility] Department of Clinical Social Work's list of facilities offering this level of care within the geographic area requested by the patient (or if unable, by the patient's family).  11/11/2013  Patient/family informed of their freedom to choose among providers that offer the needed level of care, that participate in Medicare, Medicaid or managed care program needed by the patient, have an available bed and are willing to accept the patient.  11/11/2013  Patient/family informed of [facility]' ownership interest in [facility], as well as of the fact that they are under no obligation to receive care at this facility.  PASARR submitted to [employer] on 11/11/2013 PASARR number received from [employer] on 11/11/2013  FL2 transmitted to all facilities in geographic area requested by pt/family on  11/11/2013 FL2 transmitted to all facilities within larger geographic area on   Patient informed that his/her managed care company has contracts with or will negotiate with  certain facilities, including the following:     Patient/family informed of bed offers received:  11/11/2013 Patient chooses bed at [facility] Physician recommends and patient chooses bed at    Patient to be transferred to [facility] on  11/18/2013 Patient to be transferred to facility by [facility]  The following physician request were entered in Epic:   Additional Comments:   Unice Bailey, LCSW [facility] Clinical Social Worker cell #: [phone]

## 2013-11-18 NOTE — Discharge Summary (Signed)
Physician Discharge Summary  Gina Wilkinson:[id number] DOB: 1920-11-06 DOA: 11/09/2013  PCP: Gina Clement, MD  Admit date: 11/09/2013 Discharge date: pending  Recommendations for Outpatient Follow-up:  1. Transfer to SNF for ongoing PT/OT  2. Follow up with orthopedics in 2 weeks for follow up of left hip repair 3. Follow up with cardiology in 2 weeks for follow up diastolic heart failure, either previous cardiologist at [facility] in [place] or [facility] Dr. Ladona Ridgel.   4. Weekly BMP to be sent to PCP for monitoring of potassium and creatinine while on diuretics 5. Okay to remove surgical dressing 12/26.   6. M-W-F dressing changes with calcium alginate dressing topped with soft silicone foam dressing to RLE wound.   Discharge Diagnoses:  Active Problems:   HYPOTHYROIDISM   ATRIAL FIBRILLATION WITH RAPID VENTRICULAR RESPONSE   MDS (myelodysplastic syndrome)   Hip fracture   Acute diastolic CHF (congestive heart failure)   Chronic atrial fibrillation   RBBB   Cellulitis and abscess of leg   Acute respiratory failure with hypoxia   Discharge Condition: stable, improved  Diet recommendation: regular until eating well, then consider transitioning to low sodium  Wt Readings from Last 3 Encounters:  11/18/13 73.7 kg (162 lb 7.7 oz)  11/18/13 73.7 kg (162 lb 7.7 oz)  08/07/13 66.951 kg (147 lb 9.6 oz)    History of present illness:   79 year old female presented after a mechanical fall at her home today. Her daughter tried to call her on the telephone, but there was no answer. A result, another daughter came to check on her and found the patient on the bathroom floor. There was no syncope. The patient has mild cognitive impairment, and this history is supplemented by the patient's daughter at the bedside. X-ray of the left hip in ED showed a left periprosthetic proximal femur fracture. Orthopedics, Dr. Cleophas Wilkinson was called.   However, further history reveals that  the patient has had increasing lower extremity edema and shortness of breath for approximately 6 days prior to this admission. The patient went to see her primary care doctor on 11/03/2013. The patient was placed on furosemide 40 mg daily. There has been some improvement of the patient's dyspnea and lower extremity edema. Echocardiogram was obtained on 11/05/2013. It showed E. of 60-65%, mild to moderate aortic stenosis, and moderate to severe pulmonary hypertension. The patient has had improvement in her breathing and lower extremity edema. However, she continues to have some dyspnea. She denies any chest pain, fevers, chills, coughing, hemoptysis, nausea, vomiting, diarrhea, dysuria and hematuria, abdominal pain. In addition, the patient has been on cephalexin for approximately 5 days because of the chronic right lower extremity pretibial ulceration that has not been healing well. There was concern for infection.   In the emergency department, the patient was given fluids at 125 cc an hour. The patient was hemodynamically stable without any respiratory distress. At the time of my evaluation and dictation, and serum sodium, potassium, and chloride are pending. Serum creatinine 1.6. CBC showed hemoglobin 10.2.   Hospital Course:   Acute hypoxic respiratory failure due to acute on Chronic diastolic CHF with pulmonary edema on chest x-ray.  At the time of admission, she was volume overloaded and mildly hypotensive.  Her echocardiogram demonstrated an ejection fraction of 60-65% with moderate to severe pulmonary hypertension. Cardiology was consult in and her metoprolol was discontinued due to low blood pressure. She was gently diuresis with Lasix. She was placed on a fluid  restriction. Her troponins were negative and her telemetry remained in atrial fibrillation with controlled rate. Her heart failure gradually improved.  On December 18 she was taken for fixation of her right proximal femur fracture, and  postoperatively she developed atrial fibrillation with RVR and acute diastolic heart failure. The next several days she had high do this beta blocker which was gradually tapered, and as her blood pressure tolerated she was restarted on Lasix. Lasix 40 mg IV cause her to become hypotensive to the high 70s systolic tissue is transitioning back to oral Lasix and had very slow diuresis over several days. During this time she had acute hypoxic respiratory failure and, per cardiology it is felt that she will likely have ongoing hypoxic respiratory failure due to her heart failure for the indefinite future.  Her chest x-ray demonstrated bilateral pleural effusions and some pulmonary edema which were marginally better by the time of discharge.  She will need weekly BNP done to check her electrolytes and her creatinine which can be sent to her primary doctor or the supervising physician. She will need cardiology followup in 2-3 weeks.  Right proximal femur fracture status post ORIF on 12/18.  Initially tolerated her procedure well and had a drain left in place postoperatively which was removed a few days later. She did not have significant swelling, ecchymoses, or hematoma formation.  She was advised to do touchdown weightbearing on the left leg, and she will need outpatient followup with orthopedic surgery in approximately 2-3 weeks. She had very slow progression with physical and occupational therapy due to inability his place weight on her left leg and weakness from her prolonged hospitalization and diastolic heart failure. She was advised to go to a skilled nursing facility for ongoing rehabilitation per physical and occupational therapy.  She will continue aspirin for DVT prophylaxis.  Atrial fibrillation with RVR post-operatively with HR in the 140s to 150s, asymptomatic except for the development of recurrent acute diastolic heart failure.  Metoprolol restarted TID, however, due to hypotension, her dose was  gradually decreased.  Her HR has generally stayed in the 90s.  Will monitor HR and BP on current dose of metoprolol and lasix and if both remain stable x 24 hours, plan to discharge to SNF on these doses and with close follow up.  TSH 1.37.  Continue aspirin. Not a warfarin candidate due to myelodysplastic syndrome.   Chronic kidney disease stage III, BUN and creatinine stable.   Cellulitis of bilateral lower extremities. Resolved. Completed 7 days of vancomycin on 12/21.  She was seen by wound care who recommended M-W-F dressing changes with calcium alginate dressing topped with soft silicone foam dressing to RLE wound.  Please monitor for improvement and could taper to twice weekly dressing changes at that time.  Blood cultures no growth to date.    Myelodysplastic syndrome Patient usually follows Dr. Cyndie Chime. Getting intermittent Aranesp intermittently as outpatient. Hgb trended down slightly during admission, likely due to anemia of chronic disease and some blood loss.  Given aranesp once on 12/21.  Iron studies consistent with chronic disease, folate pending, vitamin B12 wnl a month ago.    Hypothyroidism , stable, continue Synthroid   Hyponatremia, likely secondary to acute CHF exacerbation and improved with diuresis   Poor appetite postoperatively (moderate protein cal malnutrition), liberalize diet and add supplements.  Nutrition consulted and recommended magic cup once daily in addition to regular diet (possible with low sodium)  Constipation: started her on senna docusate and sorbitol,  miralax. We will also give her a dose of enema, if she does not have a bowel movement today.    Consultants:  Cardiology Dr. Clifton James  Orthopedics Dr. Roda Shutters Procedures:  XR femur  CXR  ECHO Antibiotics:  Vancomycin 12/15 >> 12/21  Discharge Exam: Filed Vitals:   11/18/13 1007  BP: 115/54  Pulse: 98  Temp:   Resp:    Filed Vitals:   11/17/13 2144 11/18/13 0613 11/18/13 0753 11/18/13  1007  BP: 101/44 101/52  115/54  Pulse: 94 95  98  Temp: 97.8 F (36.6 C) 97.7 F (36.5 C)    TempSrc: Oral Oral    Resp: 18 18 16    Height:      Weight:  73.7 kg (162 lb 7.7 oz)    SpO2: 94% 96% 96%     General: Caucasian female, No acute distress, nasal cannula in place  HEENT: NCAT, MMM  Cardiovascular: IRRR heart rate in the 90s, nl S1, S2, 3/6 systolic murmur at the left sternal border, 2+ pulses, warm extremities Respiratory:  Faint rales at bases, no wheezes or rhonchi, no increased WOB  Abdomen: NABS, soft, NT/ND  MSK: Normal tone and bulk, trace LEE, left hip dressing with trace amount of blood, no hematoma, minimal ecchymoses  Neuro: Grossly intact   Discharge Instructions      Discharge Orders   Future Appointments Provider Department Dept Phone   11/27/2013 11:00 AM [facility] [place] [facility] [phone]   11/27/2013 11:30 AM [facility] In[facility] Nurse [place] [facility] [phone]   12/07/2013 10:15 AM Lenn Sink, DPM [facility] [phone]   12/25/2013 3:15 PM [facility] [place] [facility] [phone]   12/25/2013 3:45 PM [facility] Nurse [place] [facility] [phone]   01/22/2014 10:00 AM [facility] [place] [facility] [phone]   01/22/2014 10:30 AM [facility] Nurse [place] [facility] [phone]   02/19/2014 10:15 AM [facility] [phone]   02/19/2014 10:45 AM Rana Snare, NP  [facility] [phone]   02/19/2014 11:15 AM [facility] Nurse  [facility] [phone]   Future Orders Complete By Expires   Touch down weight bearing  As directed    Questions:     Laterality:  left   Extremity:  Lower   Touch down weight bearing  As directed     Questions:     Laterality:     Extremity:         Medication List    STOP taking these medications       aspirin 81 MG tablet  Replaced by:  aspirin EC 325 MG tablet     lisinopril 10 MG tablet  Commonly known as:  PRINIVIL,ZESTRIL     metoprolol succinate 100 MG 24 hr tablet  Commonly known as:  TOPROL-XL      TAKE these medications       acetaminophen 325 MG tablet  Commonly known as:  TYLENOL  Take 2 tablets (650 mg total) by mouth every 6 (six) hours as needed for mild pain, moderate pain, fever or headache.     aspirin EC 325 MG tablet  Take 1 tablet (325 mg total) by mouth 2 (two) times daily.     DSS 100 MG Caps  Take 100 mg by mouth 2 (two)  times daily.     furosemide 40 MG tablet  Commonly known as:  LASIX  Take 40 mg by mouth daily.     HYDROcodone-acetaminophen 5-325 MG per tablet  Commonly known as:  NORCO  Take 1-2 tablets by mouth every 6 (six) hours as needed.     levothyroxine 125 MCG tablet  Commonly known as:  SYNTHROID, LEVOTHROID  Take 125 mcg by mouth daily.     metoprolol tartrate 12.5 mg Tabs tablet  Commonly known as:  LOPRESSOR  Take 0.5 tablets (12.5 mg total) by mouth 2 (two) times daily.     polyethylene glycol packet  Commonly known as:  MIRALAX / GLYCOLAX  Take 17 g by mouth daily as needed for mild constipation.     senna 8.6 MG Tabs tablet  Commonly known as:  SENOKOT  Take 1 tablet (8.6 mg total) by mouth 2 (two) times daily.       Follow-up Information   Follow up with Cheral Almas, MD On 11/27/2013.   Specialty:  Orthopedic Surgery   Contact information:   [address] [place] [address] [phone]       Follow up with Gina Clement, MD. Schedule an appointment as soon as possible for a visit in 2 weeks.   Specialty:  Cardiology   Contact information:   [address] [place] [address] [phone]        The results of significant diagnostics from this  hospitalization (including imaging, microbiology, ancillary and laboratory) are listed below for reference.    Significant Diagnostic Studies: Dg Chest 1 View  11/09/2013   CLINICAL DATA:  Left hip fracture. Recent shortness of breath and coughing. A preop for hip surgery.  EXAM: CHEST - 1 VIEW  COMPARISON:  Two-view chest 09/06/2012. CT chest with contrast 03/16/2009.  FINDINGS: The heart is enlarged. Edematous changes are superimposed on chronic interstitial coarsening. A large hiatal hernia is present. Chronic prominence of the right hila is stable. Atherosclerotic calcifications are present at the aortic arch.  IMPRESSION: 1. New interstitial airspace disease representing edema superimposed on chronic change. 2. Stable cardiomegaly. 3. Stable large hiatal hernia.   Electronically Signed   By: Gennette Pac M.D.   On: 11/09/2013 14:53   Dg Hip Complete Left  11/09/2013   CLINICAL DATA:  Fall, pain.  EXAM: LEFT HIP - COMPLETE 2+ VIEW  COMPARISON:  None.  FINDINGS: There is a periprosthetic proximal femur fracture along the medial and distal aspect of the left total hip replacement femoral component. Slight angulation anteriorly.  IMPRESSION: Left periprosthetic proximal femur fracture.   Electronically Signed   By: Davonna Belling M.D.   On: 11/09/2013 13:32   Dg Femur Left  11/12/2013   CLINICAL DATA:  Fracture fixation  EXAM: LEFT FEMUR - 2 VIEW  COMPARISON:  11/09/2013  FINDINGS: Femoral fracture at the level of the distal femoral stem has been fixed with a long lateral plate and multiple screws. Three cerclage wires are present around the proximal femur. Fracture alignment in satisfactory position. Left hip hemiarthroplasty again noted.  IMPRESSION: Plate and screw fixation femur fracture.   Electronically Signed   By: Marlan Palau M.D.   On: 11/12/2013 16:02   Dg Chest Port 1 View  11/16/2013   CLINICAL DATA:  Heart failure.  EXAM: PORTABLE CHEST - 1 VIEW  COMPARISON:  11/13/2013.  FINDINGS:  Persistent severe cardiomegaly. Mild pulmonary venous congestion. Previously identified pulmonary interstitial edema and pleural  effusions have improved slightly. No new infiltrates noted. No evidence of pneumothorax. No acute osseous abnormality .  IMPRESSION: 1. Severe cardiomegaly. 2. Mild improvement of pulmonary interstitial edema and pleural effusions. Persistent interstitial edema and pleural effusions remain.   Electronically Signed   By: Maisie Fus  Register   On: 11/16/2013 07:30   Dg Chest Port 1 View  11/13/2013   CLINICAL DATA:  Congestive heart failure.  EXAM: PORTABLE CHEST - 1 VIEW  COMPARISON:  Chest x-ray 11/12/2013.  Chest x-ray 09/06/2012.  FINDINGS: Cardiomegaly with pulmonary vascular prominence present. Diffuse interstitial prominence with basilar and right upper lobe alveolar infiltrates present. These findings are most consistent with congestive heart failure and pulmonary edema. Underlying pneumonia cannot be excluded. Right-sided pleural effusion is present with probable fluid in the fissures. No pneumothorax. No acute osseous abnormality.  IMPRESSION: Findings most consistent with congestive heart failure and pulmonary edema. Right pleural effusion. No significant interim change noted from prior exam. Underlying pneumonia particularly in the lung bases and right upper lobe cannot be excluded.   Electronically Signed   By: Maisie Fus  Register   On: 11/13/2013 07:22   Dg Chest Port 1 View  11/12/2013   CLINICAL DATA:  Postoperative study, patient with tachycardia and dyspnea abnormal chest exam  EXAM: PORTABLE CHEST - 1 VIEW  COMPARISON:  Preoperative study dated November 09, 2013.  FINDINGS: The lungs are well-expanded. The interstitial markings remain increased and are somewhat more conspicuous on the right peripherally. The right hemidiaphragm is obscured and there is partial obscuration of the left hemidiaphragm. The cardiopericardial silhouette is enlarged. The pulmonary vascularity  is indistinct. There is a moderate-sized hiatal hernia -partially intrathoracic stomach which is not new.  IMPRESSION: The findings are consistent with congestive heart failure with pulmonary interstitial edema. Developing atelectasis or pneumonia in the right lung and at the left lung base is suspected. Continued follow-up films would be useful.   Electronically Signed   By: David  [place]   On: 11/12/2013 16:40   Dg Femur Left Port  11/12/2013   CLINICAL DATA:  Left femur fracture fixation.  EXAM: PORTABLE LEFT FEMUR - 2 VIEW  FINDINGS: There is a long plate, multiple screws and cerclage wires transfixing the periprosthetic femur fracture. No complicating features are demonstrated.  IMPRESSION: Long plate with screws and cerclage wires transfixing the periprosthetic femur fracture.   Electronically Signed   By: Loralie Champagne M.D.   On: 11/12/2013 16:39   Dg C-arm 61-120 Min-no Report  11/12/2013   CLINICAL DATA: left hip fracture   C-ARM 61-120 MINUTES  Fluoroscopy was utilized by the requesting physician.  No radiographic  interpretation.     Microbiology: Recent Results (from the past 240 hour(s))  CULTURE, BLOOD (ROUTINE X 2)     Status: None   Collection Time    11/09/13  5:11 PM      Result Value Range Status   Specimen Description BLOOD LEFT ARM   Final   Special Requests BOTTLES DRAWN AEROBIC ONLY 3CC   Final   Culture  Setup Time     Final   Value: 11/10/2013 00:18     Performed at [facility]   Culture     Final   Value: NO GROWTH 5 DAYS     Performed at [facility]   Report Status 11/16/2013 FINAL   Final  CULTURE, BLOOD (ROUTINE X 2)     Status: None   Collection Time    11/09/13  5:25  PM      Result Value Range Status   Specimen Description BLOOD RIGHT HAND   Final   Special Requests BOTTLES DRAWN AEROBIC ONLY 3CC   Final   Culture  Setup Time     Final   Value: 11/10/2013 00:18     Performed at [facility]   Culture     Final   Value: NO  GROWTH 5 DAYS     Performed at [facility]   Report Status 11/16/2013 FINAL   Final  SURGICAL PCR SCREEN     Status: None   Collection Time    11/11/13  3:39 PM      Result Value Range Status   MRSA, PCR NEGATIVE  NEGATIVE Final   Staphylococcus aureus NEGATIVE  NEGATIVE Final   Comment:            The Xpert SA Assay (FDA     approved for NASAL specimens     in patients over 70 years of age),     is one component of     a comprehensive surveillance     program.  Test performance has     been validated by [facility] for patients greater     than or equal to 59 year old.     It is not intended     to diagnose infection nor to     guide or monitor treatment.     Labs: Basic Metabolic Panel:  Recent Labs Lab 11/13/13 2016 11/14/13 0520 11/15/13 0620 11/16/13 0455 11/17/13 0453 11/18/13 0453  NA  --  132* 129* 134* 134* 136  K  --  4.7 4.8 4.6 4.4 4.3  CL  --  93* 91* 95* 96 96  CO2  --  33* 34* 33* 30 33*  GLUCOSE  --  108* 101* 95 92 98  BUN  --  29* 35* 35* 36* 33*  CREATININE  --  1.31* 1.47* 1.47* 1.37* 1.33*  CALCIUM  --  8.5 8.5 8.4 8.6 8.9  MG 1.9  --   --   --   --   --    Liver Function Tests: No results found for this basename: AST, ALT, ALKPHOS, BILITOT, PROT, ALBUMIN,  in the last 168 hours No results found for this basename: LIPASE, AMYLASE,  in the last 168 hours No results found for this basename: AMMONIA,  in the last 168 hours CBC:  Recent Labs Lab 11/12/13 0400 11/12/13 1628 11/13/13 0358 11/14/13 0520 11/15/13 0620  WBC 7.0 10.3 5.9 7.5 7.3  HGB 8.5* 10.3* 9.0* 8.7* 8.2*  HCT 26.7* 32.0* 28.3* 26.3* 24.6*  MCV 96.0 96.1 95.9 94.3 93.5  PLT 190 209 185 164 199   Cardiac Enzymes:  Recent Labs Lab 11/12/13 1628 11/12/13 2032 11/13/13 0358  TROPONINI <0.30 <0.30 <0.30   BNP: BNP (last 3 results)  Recent Labs  11/09/13 1721  PROBNP 5281.0*   CBG: No results found for this basename: GLUCAP,  in the last 168  hours  Time coordinating discharge: 45 minutes  Signed:  Brittlyn Cloe  [facility] 11/18/2013, 10:27 AM

## 2013-11-18 NOTE — Progress Notes (Signed)
Patient Name: Gina Wilkinson Date of Encounter: 11/18/2013     Active Problems:   HYPOTHYROIDISM   ATRIAL FIBRILLATION WITH RAPID VENTRICULAR RESPONSE   MDS (myelodysplastic syndrome)   Hip fracture   Acute diastolic CHF (congestive heart failure)   Chronic atrial fibrillation   RBBB   Cellulitis and abscess of leg   Acute respiratory failure with hypoxia    SUBJECTIVE  Patient denies chest pain or shortness of breath.  Still weak.  Continues to diurese.  Labs are stable    CURRENT MEDS . acetaminophen  650 mg Oral Q6H  . antiseptic oral rinse  15 mL Mouth Rinse q12n4p  . aspirin EC  325 mg Oral BID  . chlorhexidine  15 mL Mouth Rinse BID  . docusate sodium  100 mg Oral BID  . furosemide  40 mg Oral Daily  . levothyroxine  125 mcg Oral QAC breakfast  . metoprolol tartrate  12.5 mg Oral BID  . senna  1 tablet Oral BID  . sodium chloride  3 mL Intravenous Q12H    OBJECTIVE  Filed Vitals:   11/17/13 0455 11/17/13 1300 11/17/13 2144 11/18/13 0613  BP: 98/41 98/44 101/44 101/52  Pulse: 72 74 94 95  Temp: 98.2 F (36.8 C) 96.5 F (35.8 C) 97.8 F (36.6 C) 97.7 F (36.5 C)  TempSrc: Oral Axillary Oral Oral  Resp: 16 18 18 18   Height:      Weight: 161 lb 13.1 oz (73.4 kg)   162 lb 7.7 oz (73.7 kg)  SpO2: 94% 96% 94% 96%    Intake/Output Summary (Last 24 hours) at 11/18/13 0709 Last data filed at 11/18/13 0500  Gross per 24 hour  Intake    303 ml  Output   1400 ml  Net  -1097 ml   Filed Weights   11/16/13 0430 11/17/13 0455 11/18/13 0613  Weight: 158 lb 15.2 oz (72.1 kg) 161 lb 13.1 oz (73.4 kg) 162 lb 7.7 oz (73.7 kg)    PHYSICAL EXAM  General: Pleasant, NAD.  HEENT:  Normal  Neck: Supple without bruits.  Jugular venous pressure iappears to be about normal Lungs:  clear Heart: RRR no s3, s4,   2/6 systolic murmur at apex and base.  No diastolic murmur Abdomen: Soft, non-tender, non-distended, BS + x 4.  Extremities: No clubbing, cyanosis or  edema. DP/PT/Radials 2+ and equal bilaterally. Neuro: Alert and oriented X 3. Moves all extremities spontaneously. Psych: Normal affect.   Intake/Output Summary (Last 24 hours) at 11/18/13 0709 Last data filed at 11/18/13 0500  Gross per 24 hour  Intake    303 ml  Output   1400 ml  Net  -1097 ml    Accessory Clinical Findings  CBC No results found for this basename: WBC, NEUTROABS, HGB, HCT, MCV, PLT,  in the last 72 hours Basic Metabolic Panel  Recent Labs  11/17/13 0453 11/18/13 0453  NA 134* 136  K 4.4 4.3  CL 96 96  CO2 30 33*  GLUCOSE 92 98  BUN 36* 33*  CREATININE 1.37* 1.33*  CALCIUM 8.6 8.9   Liver Function Tests No results found for this basename: AST, ALT, ALKPHOS, BILITOT, PROT, ALBUMIN,  in the last 72 hours No results found for this basename: LIPASE, AMYLASE,  in the last 72 hours Cardiac Enzymes No results found for this basename: CKTOTAL, CKMB, CKMBINDEX, TROPONINI,  in the last 72 hours BNP No components found with this basename: POCBNP,  D-Dimer No results found  for this basename: DDIMER,  in the last 72 hours Hemoglobin A1C No results found for this basename: HGBA1C,  in the last 72 hours Fasting Lipid Panel No results found for this basename: CHOL, HDL, LDLCALC, TRIG, CHOLHDL, LDLDIRECT,  in the last 72 hours Thyroid Function Tests No results found for this basename: TSH, T4TOTAL, FREET3, T3FREE, THYROIDAB,  in the last 72 hours  TELE  11/16/13:  Atrial fibrillation with controlled ventricular response - 78, occasional PVCs  ECG   Radiology/Studies    Dg Chest Port 1 View  Nov 17, 2013   CLINICAL DATA:  Congestive heart failure.  EXAM: PORTABLE CHEST - 1 VIEW  COMPARISON:  Chest x-ray 11/12/2013.  Chest x-ray 09/06/2012.  FINDINGS: Cardiomegaly with pulmonary vascular prominence present. Diffuse interstitial prominence with basilar and right upper lobe alveolar infiltrates present. These findings are most consistent with congestive heart  failure and pulmonary edema. Underlying pneumonia cannot be excluded. Right-sided pleural effusion is present with probable fluid in the fissures. No pneumothorax. No acute osseous abnormality.  IMPRESSION: Findings most consistent with congestive heart failure and pulmonary edema. Right pleural effusion. No significant interim change noted from prior exam. Underlying pneumonia particularly in the lung bases and right upper lobe cannot be excluded.   Electronically Signed   By: Maisie Fus  Register   On: 2013/11/17 07:22   Dg Chest Port 1 View  11/12/2013   CLINICAL DATA:  Postoperative study, patient with tachycardia and dyspnea abnormal chest exam  EXAM: PORTABLE CHEST - 1 VIEW  COMPARISON:  Preoperative study dated November 09, 2013.  FINDINGS: The lungs are well-expanded. The interstitial markings remain increased and are somewhat more conspicuous on the right peripherally. The right hemidiaphragm is obscured and there is partial obscuration of the left hemidiaphragm. The cardiopericardial silhouette is enlarged. The pulmonary vascularity is indistinct. There is a moderate-sized hiatal hernia -partially intrathoracic stomach which is not new.  IMPRESSION: The findings are consistent with congestive heart failure with pulmonary interstitial edema. Developing atelectasis or pneumonia in the right lung and at the left lung base is suspected. Continued follow-up films would be useful.   Electronically Signed   By: David  [place]   On: 11/12/2013 16:40    ASSESSMENT AND PLAN 79 yo female with history of diastolic CHF, MDS and permanent atrial fibrillation admitted after mechanical fall resulting in left proximal femur fracture     She was noted to be volume overloaded on exam when admitted. Normal EF and moderate-severe pulmonary HTN on echo. Diuresed well with several doses of Lasix. Now s/p left femur repair. Pt developed atrial fibrillation with RVR post surgery.   1. Acute diastolic CHF: Preserved EF on echo  but moderate to severe pulmonary HTN, moderate LVH.   She has continued to diurese - BUN and Cr are stable.  She will need to be followed at the SNF.     She has continued to do well on PO lasix.  Very comfortable lying supine in bed.    2. Chronic persistent atrial fibrillation:  She has not been on anticoagulation because of her risk of falling and her myelodysplastic disease.  Rate is adequately controlled at present.   Continue current meds.  3. Aortic valve stenosis: Mild to moderate by echo     Vesta Mixer, Montez Hageman., MD, [name] 11/18/2013, 7:09 AM Office - [phone] Pager [phone]

## 2013-11-18 NOTE — Progress Notes (Signed)
After mirilax, sorbitol,senna, and enema, pt had a large BM.  Report called to [facility] home and [facility] aware of transfer

## 2013-11-25 ENCOUNTER — Encounter: Payer: Self-pay | Admitting: Oncology

## 2013-11-25 ENCOUNTER — Other Ambulatory Visit: Payer: Self-pay | Admitting: Oncology

## 2013-11-25 ENCOUNTER — Telehealth: Payer: Self-pay | Admitting: *Deleted

## 2013-11-25 DIAGNOSIS — D638 Anemia in other chronic diseases classified elsewhere: Secondary | ICD-10-CM

## 2013-11-25 NOTE — Telephone Encounter (Signed)
Called & spoke with pt's daughter, [name] & informed that we received labs from [facility] & pt needs blood transfusion per Dr Cyndie Chime.  Discussed with Maggie RN & pt needs to be here fri am @ 7:30 am for T&CM & receive at least 1 unit of blood & will try for two if we have a cancellation.  She will try to arrange transportation with [facility].

## 2013-11-25 NOTE — Progress Notes (Signed)
Elderly lady with chronic anemia due to MDS on chronic aranesp support.  Lab faxed to [place] today after 5 PM:  Hb 7.7; was 8/2 on 11/15/13 & she received aranesp. I am setting her up for blood in our office on Friday 11/27/13. I left message on her home phone that count was low.  She should be taken to hospital if any significant chest pain or dyspnea.  We will call her with time to come in for blood on Friday.

## 2013-11-26 DIAGNOSIS — R059 Cough, unspecified: Secondary | ICD-10-CM | POA: Diagnosis not present

## 2013-11-26 DIAGNOSIS — R05 Cough: Secondary | ICD-10-CM | POA: Diagnosis not present

## 2013-11-27 ENCOUNTER — Ambulatory Visit (HOSPITAL_BASED_OUTPATIENT_CLINIC_OR_DEPARTMENT_OTHER): Payer: Medicare Other

## 2013-11-27 ENCOUNTER — Other Ambulatory Visit: Payer: Medicare Other

## 2013-11-27 ENCOUNTER — Other Ambulatory Visit: Payer: Self-pay | Admitting: *Deleted

## 2013-11-27 ENCOUNTER — Ambulatory Visit (HOSPITAL_COMMUNITY)
Admission: RE | Admit: 2013-11-27 | Discharge: 2013-11-27 | Disposition: A | Discharge status: Home / Self Care | Payer: No Typology Code available for payment source | Source: Ambulatory Visit | Attending: Oncology | Admitting: Oncology

## 2013-11-27 ENCOUNTER — Ambulatory Visit: Payer: Medicare Other

## 2013-11-27 VITALS — BP 129/67 | HR 74 | Temp 98.0°F

## 2013-11-27 DIAGNOSIS — D638 Anemia in other chronic diseases classified elsewhere: Secondary | ICD-10-CM

## 2013-11-27 DIAGNOSIS — D469 Myelodysplastic syndrome, unspecified: Secondary | ICD-10-CM

## 2013-11-27 DIAGNOSIS — D649 Anemia, unspecified: Secondary | ICD-10-CM

## 2013-11-27 LAB — HOLD TUBE, BLOOD BANK

## 2013-11-27 LAB — PREPARE RBC (CROSSMATCH)

## 2013-11-27 MED ORDER — FUROSEMIDE 10 MG/ML IJ SOLN
20.0000 mg | Freq: Once | INTRAMUSCULAR | Status: AC
  Administered 2013-11-27: 20 mg via INTRAVENOUS

## 2013-11-27 NOTE — Patient Instructions (Signed)
Blood Transfusion Information WHAT IS A BLOOD TRANSFUSION? A transfusion is the replacement of blood or some of its parts. Blood is made up of multiple cells which provide different functions.  Red blood cells carry oxygen and are used for blood loss replacement.  White blood cells fight against infection.  Platelets control bleeding.  Plasma helps clot blood.  Other blood products are available for specialized needs, such as hemophilia or other clotting disorders. BEFORE THE TRANSFUSION  Who gives blood for transfusions?   You may be able to donate blood to be used at a later date on yourself (autologous donation).  Relatives can be asked to donate blood. This is generally not any safer than if you have received blood from a stranger. The same precautions are taken to ensure safety when a relative's blood is donated.  Healthy volunteers who are fully evaluated to make sure their blood is safe. This is blood bank blood. Transfusion therapy is the safest it has ever been in the practice of medicine. Before blood is taken from a donor, a complete history is taken to make sure that person has no history of diseases nor engages in risky social behavior (examples are intravenous drug use or sexual activity with multiple partners). The donor's travel history is screened to minimize risk of transmitting infections, such as malaria. The donated blood is tested for signs of infectious diseases, such as HIV and hepatitis. The blood is then tested to be sure it is compatible with you in order to minimize the chance of a transfusion reaction. If you or a relative donates blood, this is often done in anticipation of surgery and is not appropriate for emergency situations. It takes many days to process the donated blood. RISKS AND COMPLICATIONS Although transfusion therapy is very safe and saves many lives, the main dangers of transfusion include:   Getting an infectious disease.  Developing a  transfusion reaction. This is an allergic reaction to something in the blood you were given. Every precaution is taken to prevent this. The decision to have a blood transfusion has been considered carefully by your caregiver before blood is given. Blood is not given unless the benefits outweigh the risks. AFTER THE TRANSFUSION  Right after receiving a blood transfusion, you will usually feel much better and more energetic. This is especially true if your red blood cells have gotten low (anemic). The transfusion raises the level of the red blood cells which carry oxygen, and this usually causes an energy increase.  The nurse administering the transfusion will monitor you carefully for complications. HOME CARE INSTRUCTIONS  No special instructions are needed after a transfusion. You may find your energy is better. Speak with your caregiver about any limitations on activity for underlying diseases you may have. SEEK MEDICAL CARE IF:   Your condition is not improving after your transfusion.  You develop redness or irritation at the intravenous (IV) site. SEEK IMMEDIATE MEDICAL CARE IF:  Any of the following symptoms occur over the next 12 hours:  Shaking chills.  You have a temperature by mouth above 102 F (38.9 C), not controlled by medicine.  Chest, back, or muscle pain.  People around you feel you are not acting correctly or are confused.  Shortness of breath or difficulty breathing.  Dizziness and fainting.  You get a rash or develop hives.  You have a decrease in urine output.  Your urine turns a dark color or changes to pink, red, or brown. Any of the following   symptoms occur over the next 10 days:  You have a temperature by mouth above 102 F (38.9 C), not controlled by medicine.  Shortness of breath.  Weakness after normal activity.  The white part of the eye turns yellow (jaundice).  You have a decrease in the amount of urine or are urinating less often.  Your  urine turns a dark color or changes to pink, red, or brown. Document Released: 11/09/2000 Document Revised: 02/04/2012 Document Reviewed: 06/28/2008 ExitCare Patient Information 2014 [employer].  

## 2013-11-28 LAB — TYPE AND SCREEN
ABO/RH(D): A POS
Antibody Screen: POSITIVE
DAT, IGG: NEGATIVE
UNIT DIVISION: 0
Unit division: 0

## 2013-11-30 DIAGNOSIS — S72009A Fracture of unspecified part of neck of unspecified femur, initial encounter for closed fracture: Secondary | ICD-10-CM | POA: Diagnosis not present

## 2013-11-30 DIAGNOSIS — S72309B Unspecified fracture of shaft of unspecified femur, initial encounter for open fracture type I or II: Secondary | ICD-10-CM | POA: Diagnosis not present

## 2013-12-04 ENCOUNTER — Encounter: Payer: Medicare Other | Admitting: Physician Assistant

## 2013-12-07 ENCOUNTER — Ambulatory Visit: Payer: PRIVATE HEALTH INSURANCE | Admitting: Podiatry

## 2013-12-08 ENCOUNTER — Encounter: Payer: Self-pay | Admitting: Physician Assistant

## 2013-12-08 ENCOUNTER — Ambulatory Visit (INDEPENDENT_AMBULATORY_CARE_PROVIDER_SITE_OTHER): Payer: Medicare Other | Admitting: Physician Assistant

## 2013-12-08 VITALS — BP 120/70 | HR 79 | Ht 66.0 in | Wt 150.0 lb

## 2013-12-08 DIAGNOSIS — I4891 Unspecified atrial fibrillation: Secondary | ICD-10-CM

## 2013-12-08 DIAGNOSIS — D469 Myelodysplastic syndrome, unspecified: Secondary | ICD-10-CM | POA: Diagnosis not present

## 2013-12-08 DIAGNOSIS — I359 Nonrheumatic aortic valve disorder, unspecified: Secondary | ICD-10-CM

## 2013-12-08 DIAGNOSIS — I5032 Chronic diastolic (congestive) heart failure: Secondary | ICD-10-CM | POA: Diagnosis not present

## 2013-12-08 DIAGNOSIS — I272 Pulmonary hypertension, unspecified: Secondary | ICD-10-CM | POA: Insufficient documentation

## 2013-12-08 DIAGNOSIS — I350 Nonrheumatic aortic (valve) stenosis: Secondary | ICD-10-CM

## 2013-12-08 DIAGNOSIS — I2789 Other specified pulmonary heart diseases: Secondary | ICD-10-CM

## 2013-12-08 DIAGNOSIS — I10 Essential (primary) hypertension: Secondary | ICD-10-CM

## 2013-12-08 NOTE — Progress Notes (Signed)
[address] [place], [place]  [address] Phone: [phone] Fax:  [phone]  Date:  12/08/2013   ID:  Gina Wilkinson, DOB [birth year], MRN [id number]  PCP:  Darlin Coco, MD  Cardiologist:  Dr. Cristopher Peru     History of Present Illness: Gina Wilkinson is a 80 y.o. female with a hx of myelodysplastic syndrome, permanent AFib (not on anticoagulation due to MDS), HTN, hypothyroidism, diastolic CHF.  She was admitted last month for a mechanical fall resulting in L femur fracture.  She was volume overloaded and cardiology followed her. Cardiac markers were normal.  She was diuresed prior to surgery.  Echo (11/05/2013): Moderate LVH, EF 60-65%, normal wall motion, mild to moderate aortic stenosis (mean gradient 15 mmHg), mild MR, severe BAE, mild RVE, moderate TR, PASP 73 (moderate to severe pulmonary hypertension), trivial effusion.  She ultimately underwent repair of her left femur.  She developed RVR in the setting of atrial fibrillation and worsening volume overload postoperatively.  Her blood pressure did remain soft limiting the use of her beta blocker for rate control.  She was d/c to SNF.   She is staying at [facility].  She is here with her 2 daughters.  She is wearing continuous O2.  She is working with PT.  She denies chest pain, dyspnea, orthopnea, PND, edema.  No syncope or near syncope. She did have a blood transfusion 2 weeks ago (Hgb had dropped < 8).    Recent Labs: 09/04/2013: ALT 10  11/09/2013: Pro B Natriuretic peptide (BNP) 5281.0*  11/11/2013: TSH 1.372  11/15/2013: Hemoglobin 8.2*  11/18/2013: Creatinine 1.33*; Potassium 4.3   Wt Readings from Last 3 Encounters:  12/08/13 150 lb (68.04 kg)  11/18/13 162 lb 7.7 oz (73.7 kg)  11/18/13 162 lb 7.7 oz (73.7 kg)     Past Medical History  Diagnosis Date  . MDS (myelodysplastic syndrome) 10/05/2011  . Pneumonia   . Thyroid disease   . Hypertension   . Atrial fibrillation dx Mar. 2010     Current Outpatient Prescriptions  Medication Sig Dispense Refill  . acetaminophen (TYLENOL) 325 MG tablet Take 2 tablets (650 mg total) by mouth every 6 (six) hours as needed for mild pain, moderate pain, fever or headache.      Marland Kitchen aspirin EC 325 MG tablet Take 1 tablet (325 mg total) by mouth 2 (two) times daily.  84 tablet  0  . docusate sodium 100 MG CAPS Take 100 mg by mouth 2 (two) times daily.  10 capsule  0  . furosemide (LASIX) 40 MG tablet Take 40 mg by mouth daily.      Marland Kitchen HYDROcodone-acetaminophen (NORCO) 5-325 MG per tablet Take 1-2 tablets by mouth every 6 (six) hours as needed.  90 tablet  0  . levothyroxine (SYNTHROID, LEVOTHROID) 125 MCG tablet Take 125 mcg by mouth daily.      . metoprolol tartrate (LOPRESSOR) 12.5 mg TABS tablet Take 0.5 tablets (12.5 mg total) by mouth 2 (two) times daily.  60 tablet  0  . polyethylene glycol (MIRALAX / GLYCOLAX) packet Take 17 g by mouth daily as needed for mild constipation.  14 each  0  . senna (SENOKOT) 8.6 MG TABS tablet Take 1 tablet (8.6 mg total) by mouth 2 (two) times daily.  120 each  0   No current facility-administered medications for this visit.    Allergies:   Levaquin; Penicillins; and Sulfonamide derivatives   Social History:  The patient  reports that she has never smoked. She has never used smokeless tobacco. She reports that she does not drink alcohol or use illicit drugs.   Family History:  [name] family history is not on file.   ROS:  Please see the history of present illness.   No melena, hematochezia, hematuria.   All other systems reviewed and negative.   PHYSICAL EXAM: VS:  BP 120/70  Pulse 79  Ht 5\' 6"  (1.676 m)  Wt 150 lb (68.04 kg)  BMI 24.22 kg/m2 Well nourished, well developed, in no acute distress HEENT: normal Neck: no JVDat 90 degrees Cardiac:  normal S1, S2; irregularly irregular rhythm; 2/6 systolic murmur RUSB Lungs:  Decreased breath sounds bilaterally, faint exp wheezing, no rhonchi  or rales Abd: soft, nontender, no hepatomegaly Ext: trace bilateral LE edema Skin: warm and dry Neuro:  CNs 2-12 intact, no focal abnormalities noted  EKG:  AFib, HR 79, RAD, RBBB, no change from prior tracings     ASSESSMENT AND PLAN:  1. Chronic Diastolic CHF:  Volume stable. Check f/u BMET today.  Continue current dose of Lasix.  2. Pulmonary HTN:  She is fairly asymptomatic. Continue O2.   3. Atrial Fibrillation:  Rate controlled.  She is not a candidate for anticoagulation due to MDS.  Continue ASA.   4. Aortic Stenosis:  Mild to moderate by recent echo.  Consider follow up echo in 6-12 months.   5. Left Hip Fracture, s/p Repair:  She is recovering nicely at [facility].   6. Hypertension:  Controlled.  7. Myelodysplastic Syndrome:  Continue follow up with oncology.   8. Disposition:  F/u with Dr. Cristopher Peru in 2 mos.    Signed, Richardson Dopp, PA-C  12/08/2013 4:05 PM

## 2013-12-08 NOTE — Patient Instructions (Signed)
LAB WORK TODAY, BMET, CBC W/DIFF  PLEASE FOLLOW UP WITH DR. Lovena Le 02/18/14 @ 2:30  NO CHANGES WERE MADE TODAY WITH YOUR MEDCIATIONS

## 2013-12-09 ENCOUNTER — Telehealth: Payer: Self-pay | Admitting: *Deleted

## 2013-12-09 LAB — CBC
HCT: 30.2 % — ABNORMAL LOW (ref 36.0–46.0)
Hemoglobin: 10 g/dL — ABNORMAL LOW (ref 12.0–15.0)
MCHC: 33.3 g/dL (ref 30.0–36.0)
MCV: 95.2 fl (ref 78.0–100.0)
Platelets: 268 10*3/uL (ref 150.0–400.0)
RBC: 3.17 Mil/uL — ABNORMAL LOW (ref 3.87–5.11)
RDW: 19.6 % — ABNORMAL HIGH (ref 11.5–14.6)
WBC: 6 10*3/uL (ref 4.5–10.5)

## 2013-12-09 LAB — BASIC METABOLIC PANEL
BUN: 23 mg/dL (ref 6–23)
CO2: 33 meq/L — AB (ref 19–32)
CREATININE: 1.1 mg/dL (ref 0.4–1.2)
Calcium: 9.1 mg/dL (ref 8.4–10.5)
Chloride: 102 mEq/L (ref 96–112)
GFR: 47.86 mL/min — ABNORMAL LOW (ref >60.00)
GLUCOSE: 92 mg/dL (ref 70–99)
Potassium: 4 mEq/L (ref 3.5–5.1)
Sodium: 141 mEq/L (ref 135–145)

## 2013-12-09 NOTE — Telephone Encounter (Signed)
Returning your call. Please call patients daughter [name] @ [phone].

## 2013-12-09 NOTE — Telephone Encounter (Signed)
lmptcb for lab results 

## 2013-12-10 ENCOUNTER — Telehealth: Payer: Self-pay | Admitting: Physician Assistant

## 2013-12-10 NOTE — Telephone Encounter (Signed)
daughter [name] notified about pt's lab results with verbal understanding

## 2013-12-10 NOTE — Telephone Encounter (Signed)
lmptcb for lab results 

## 2013-12-10 NOTE — Telephone Encounter (Signed)
New message  Please send lab results over to [facility] Fax: [phone].

## 2013-12-13 DIAGNOSIS — S72009D Fracture of unspecified part of neck of unspecified femur, subsequent encounter for closed fracture with routine healing: Secondary | ICD-10-CM | POA: Diagnosis not present

## 2013-12-13 DIAGNOSIS — I509 Heart failure, unspecified: Secondary | ICD-10-CM | POA: Diagnosis not present

## 2013-12-13 DIAGNOSIS — D649 Anemia, unspecified: Secondary | ICD-10-CM | POA: Diagnosis not present

## 2013-12-25 ENCOUNTER — Other Ambulatory Visit (HOSPITAL_BASED_OUTPATIENT_CLINIC_OR_DEPARTMENT_OTHER): Payer: Medicare Other

## 2013-12-25 ENCOUNTER — Ambulatory Visit: Payer: Medicare Other

## 2013-12-25 ENCOUNTER — Ambulatory Visit (HOSPITAL_BASED_OUTPATIENT_CLINIC_OR_DEPARTMENT_OTHER): Payer: Medicare Other

## 2013-12-25 VITALS — BP 110/54 | HR 82 | Temp 98.1°F

## 2013-12-25 DIAGNOSIS — D469 Myelodysplastic syndrome, unspecified: Secondary | ICD-10-CM

## 2013-12-25 LAB — CBC WITH DIFFERENTIAL/PLATELET
BASO%: 0.3 % (ref 0.0–2.0)
BASOS ABS: 0 10*3/uL (ref 0.0–0.1)
EOS ABS: 0.2 10*3/uL (ref 0.0–0.5)
EOS%: 7.2 % — ABNORMAL HIGH (ref 0.0–7.0)
HEMATOCRIT: 27.5 % — AB (ref 34.8–46.6)
HEMOGLOBIN: 8.7 g/dL — AB (ref 11.6–15.9)
LYMPH#: 0.5 10*3/uL — AB (ref 0.9–3.3)
LYMPH%: 16.2 % (ref 14.0–49.7)
MCH: 29.4 pg (ref 25.1–34.0)
MCHC: 31.6 g/dL (ref 31.5–36.0)
MCV: 92.9 fL (ref 79.5–101.0)
MONO#: 0.4 10*3/uL (ref 0.1–0.9)
MONO%: 11.1 % (ref 0.0–14.0)
NEUT%: 65.2 % (ref 38.4–76.8)
NEUTROS ABS: 2.2 10*3/uL (ref 1.5–6.5)
PLATELETS: 188 10*3/uL (ref 145–400)
RBC: 2.96 10*6/uL — ABNORMAL LOW (ref 3.70–5.45)
RDW: 18.1 % — ABNORMAL HIGH (ref 11.2–14.5)
WBC: 3.3 10*3/uL — AB (ref 3.9–10.3)
nRBC: 0 % (ref 0–0)

## 2013-12-25 MED ORDER — DARBEPOETIN ALFA-POLYSORBATE 300 MCG/0.6ML IJ SOLN
300.0000 ug | Freq: Once | INTRAMUSCULAR | Status: AC
  Administered 2013-12-25: 300 ug via SUBCUTANEOUS
  Filled 2013-12-25: qty 0.6

## 2013-12-27 DIAGNOSIS — I509 Heart failure, unspecified: Secondary | ICD-10-CM | POA: Diagnosis not present

## 2013-12-27 DIAGNOSIS — I4891 Unspecified atrial fibrillation: Secondary | ICD-10-CM | POA: Diagnosis not present

## 2013-12-27 DIAGNOSIS — D649 Anemia, unspecified: Secondary | ICD-10-CM | POA: Diagnosis not present

## 2013-12-27 DIAGNOSIS — M6281 Muscle weakness (generalized): Secondary | ICD-10-CM | POA: Diagnosis not present

## 2013-12-27 DIAGNOSIS — S7290XD Unspecified fracture of unspecified femur, subsequent encounter for closed fracture with routine healing: Secondary | ICD-10-CM | POA: Diagnosis not present

## 2013-12-27 DIAGNOSIS — I10 Essential (primary) hypertension: Secondary | ICD-10-CM | POA: Diagnosis not present

## 2013-12-27 DIAGNOSIS — E8779 Other fluid overload: Secondary | ICD-10-CM | POA: Diagnosis not present

## 2013-12-27 DIAGNOSIS — Z5189 Encounter for other specified aftercare: Secondary | ICD-10-CM | POA: Diagnosis not present

## 2013-12-27 DIAGNOSIS — Z9181 History of falling: Secondary | ICD-10-CM | POA: Diagnosis not present

## 2013-12-28 DIAGNOSIS — S72009A Fracture of unspecified part of neck of unspecified femur, initial encounter for closed fracture: Secondary | ICD-10-CM | POA: Diagnosis not present

## 2013-12-28 DIAGNOSIS — S72309B Unspecified fracture of shaft of unspecified femur, initial encounter for open fracture type I or II: Secondary | ICD-10-CM | POA: Diagnosis not present

## 2013-12-31 DIAGNOSIS — I509 Heart failure, unspecified: Secondary | ICD-10-CM | POA: Diagnosis not present

## 2013-12-31 DIAGNOSIS — D649 Anemia, unspecified: Secondary | ICD-10-CM | POA: Diagnosis not present

## 2014-01-14 ENCOUNTER — Telehealth: Payer: Self-pay | Admitting: Internal Medicine

## 2014-01-14 NOTE — Telephone Encounter (Signed)
New message     Need a new presc [facility] for metoprolol.  It should be her new dosage since her last hosp stay.  We have not called in this presc before because it was presc by a doctor at  [facility] by whoever saw her.  Also refill lisinopril and furosemide.  They want Dr Lovena Le to refer her to a primary care doctor within the [place] office

## 2014-01-15 ENCOUNTER — Other Ambulatory Visit: Payer: Self-pay

## 2014-01-15 ENCOUNTER — Other Ambulatory Visit: Payer: Self-pay | Admitting: *Deleted

## 2014-01-15 MED ORDER — METOPROLOL TARTRATE 12.5 MG HALF TABLET: 12.5000 mg | ORAL_TABLET | Freq: Two times a day (BID) | ORAL | Status: DC

## 2014-01-15 MED ORDER — METOPROLOL TARTRATE 25 MG PO TABS: 12.5000 mg | ORAL_TABLET | Freq: Two times a day (BID) | ORAL | Status: DC

## 2014-01-15 MED ORDER — FUROSEMIDE 40 MG PO TABS: 40.0000 mg | ORAL_TABLET | Freq: Every day | ORAL | Status: DC

## 2014-01-18 NOTE — Telephone Encounter (Signed)
Gina Wilkinson please review this.

## 2014-01-21 ENCOUNTER — Telehealth: Payer: Self-pay | Admitting: Oncology

## 2014-01-21 NOTE — Telephone Encounter (Signed)
Pt called r/s lab and injection to 01/28/14

## 2014-01-21 NOTE — Telephone Encounter (Signed)
Pt's daughter called and wants to r/s appt due to weather , call pt back left message

## 2014-01-22 ENCOUNTER — Ambulatory Visit: Payer: Medicare Other

## 2014-01-22 ENCOUNTER — Other Ambulatory Visit: Payer: Medicare Other

## 2014-01-25 ENCOUNTER — Ambulatory Visit (HOSPITAL_BASED_OUTPATIENT_CLINIC_OR_DEPARTMENT_OTHER): Payer: Medicare Other

## 2014-01-25 ENCOUNTER — Other Ambulatory Visit (HOSPITAL_BASED_OUTPATIENT_CLINIC_OR_DEPARTMENT_OTHER): Payer: Medicare Other

## 2014-01-25 ENCOUNTER — Encounter (INDEPENDENT_AMBULATORY_CARE_PROVIDER_SITE_OTHER): Payer: Self-pay

## 2014-01-25 VITALS — BP 115/55 | HR 70 | Temp 98.2°F

## 2014-01-25 DIAGNOSIS — D469 Myelodysplastic syndrome, unspecified: Secondary | ICD-10-CM

## 2014-01-25 DIAGNOSIS — D4620 Refractory anemia with excess of blasts, unspecified: Secondary | ICD-10-CM

## 2014-01-25 DIAGNOSIS — D649 Anemia, unspecified: Secondary | ICD-10-CM

## 2014-01-25 DIAGNOSIS — S7290XD Unspecified fracture of unspecified femur, subsequent encounter for closed fracture with routine healing: Secondary | ICD-10-CM | POA: Diagnosis not present

## 2014-01-25 DIAGNOSIS — I5031 Acute diastolic (congestive) heart failure: Secondary | ICD-10-CM | POA: Diagnosis not present

## 2014-01-25 DIAGNOSIS — I2789 Other specified pulmonary heart diseases: Secondary | ICD-10-CM | POA: Diagnosis not present

## 2014-01-25 DIAGNOSIS — I4891 Unspecified atrial fibrillation: Secondary | ICD-10-CM | POA: Diagnosis not present

## 2014-01-25 DIAGNOSIS — I509 Heart failure, unspecified: Secondary | ICD-10-CM | POA: Diagnosis not present

## 2014-01-25 DIAGNOSIS — Z87440 Personal history of urinary (tract) infections: Secondary | ICD-10-CM | POA: Diagnosis not present

## 2014-01-25 LAB — CBC WITH DIFFERENTIAL/PLATELET
BASO%: 2 % (ref 0.0–2.0)
Basophils Absolute: 0.1 10*3/uL (ref 0.0–0.1)
EOS ABS: 0.5 10*3/uL (ref 0.0–0.5)
EOS%: 7.7 % — ABNORMAL HIGH (ref 0.0–7.0)
HEMATOCRIT: 26.8 % — AB (ref 34.8–46.6)
HEMOGLOBIN: 8.7 g/dL — AB (ref 11.6–15.9)
LYMPH%: 9.3 % — ABNORMAL LOW (ref 14.0–49.7)
MCH: 29.7 pg (ref 25.1–34.0)
MCHC: 32.6 g/dL (ref 31.5–36.0)
MCV: 91.2 fL (ref 79.5–101.0)
MONO#: 0.5 10*3/uL (ref 0.1–0.9)
MONO%: 7.7 % (ref 0.0–14.0)
NEUT%: 73.3 % (ref 38.4–76.8)
NEUTROS ABS: 5.1 10*3/uL (ref 1.5–6.5)
PLATELETS: 415 10*3/uL — AB (ref 145–400)
RBC: 2.94 10*6/uL — ABNORMAL LOW (ref 3.70–5.45)
RDW: 19.1 % — AB (ref 11.2–14.5)
WBC: 6.9 10*3/uL (ref 3.9–10.3)
lymph#: 0.6 10*3/uL — ABNORMAL LOW (ref 0.9–3.3)

## 2014-01-25 MED ORDER — DARBEPOETIN ALFA-POLYSORBATE 300 MCG/0.6ML IJ SOLN
300.0000 ug | Freq: Once | INTRAMUSCULAR | Status: AC
  Administered 2014-01-25: 300 ug via SUBCUTANEOUS
  Filled 2014-01-25: qty 0.6

## 2014-01-25 NOTE — Patient Instructions (Signed)
Darbepoetin Alfa injection What is this medicine? DARBEPOETIN ALFA (dar be POE e tin AL fa) helps your body make more red blood cells. It is used to treat anemia caused by chronic kidney failure and chemotherapy. This medicine may be used for other purposes; ask your health care provider or pharmacist if you have questions. COMMON BRAND NAME(S): Aranesp What should I tell my health care provider before I take this medicine? They need to know if you have any of these conditions: -blood clotting disorders or history of blood clots -cancer patient not on chemotherapy -cystic fibrosis -heart disease, such as angina, heart failure, or a history of a heart attack -hemoglobin level of 12 g/dL or greater -high blood pressure -low levels of folate, iron, or vitamin B12 -seizures -an unusual or allergic reaction to darbepoetin, erythropoietin, albumin, hamster proteins, latex, other medicines, foods, dyes, or preservatives -pregnant or trying to get pregnant -breast-feeding How should I use this medicine? This medicine is for injection into a vein or under the skin. It is usually given by a health care professional in a hospital or clinic setting. If you get this medicine at home, you will be taught how to prepare and give this medicine. Do not shake the solution before you withdraw a dose. Use exactly as directed. Take your medicine at regular intervals. Do not take your medicine more often than directed. It is important that you put your used needles and syringes in a special sharps container. Do not put them in a trash can. If you do not have a sharps container, call your pharmacist or healthcare provider to get one. Talk to your pediatrician regarding the use of this medicine in children. While this medicine may be used in children as young as 1 year for selected conditions, precautions do apply. Overdosage: If you think you have taken too much of this medicine contact a poison control center or  emergency room at once. NOTE: This medicine is only for you. Do not share this medicine with others. What if I miss a dose? If you miss a dose, take it as soon as you can. If it is almost time for your next dose, take only that dose. Do not take double or extra doses. What may interact with this medicine? Do not take this medicine with any of the following medications: -epoetin alfa This list may not describe all possible interactions. Give your health care provider a list of all the medicines, herbs, non-prescription drugs, or dietary supplements you use. Also tell them if you smoke, drink alcohol, or use illegal drugs. Some items may interact with your medicine. What should I watch for while using this medicine? Visit your prescriber or health care professional for regular checks on your progress and for the needed blood tests and blood pressure measurements. It is especially important for the doctor to make sure your hemoglobin level is in the desired range, to limit the risk of potential side effects and to give you the best benefit. Keep all appointments for any recommended tests. Check your blood pressure as directed. Ask your doctor what your blood pressure should be and when you should contact him or her. As your body makes more red blood cells, you may need to take iron, folic acid, or vitamin B supplements. Ask your doctor or health care provider which products are right for you. If you have kidney disease continue dietary restrictions, even though this medication can make you feel better. Talk with your doctor or health   care professional about the foods you eat and the vitamins that you take. What side effects may I notice from receiving this medicine? Side effects that you should report to your doctor or health care professional as soon as possible: -allergic reactions like skin rash, itching or hives, swelling of the face, lips, or tongue -breathing problems -changes in vision -chest  pain -confusion, trouble speaking or understanding -feeling faint or lightheaded, falls -high blood pressure -muscle aches or pains -pain, swelling, warmth in the leg -rapid weight gain -severe headaches -sudden numbness or weakness of the face, arm or leg -trouble walking, dizziness, loss of balance or coordination -seizures (convulsions) -swelling of the ankles, feet, hands -unusually weak or tired Side effects that usually do not require medical attention (report to your doctor or health care professional if they continue or are bothersome): -diarrhea -fever, chills (flu-like symptoms) -headaches -nausea, vomiting -redness, stinging, or swelling at site where injected This list may not describe all possible side effects. Call your doctor for medical advice about side effects. You may report side effects to FDA at 1-800-FDA-1088. Where should I keep my medicine? Keep out of the reach of children. Store in a refrigerator between 2 and 8 degrees C (36 and 46 degrees F). Do not freeze. Do not shake. Throw away any unused portion if using a single-dose vial. Throw away any unused medicine after the expiration date. NOTE: This sheet is a summary. It may not cover all possible information. If you have questions about this medicine, talk to your doctor, pharmacist, or health care provider.  2014, Elsevier/Gold Standard. (2008-10-26 10:23:57)  

## 2014-01-25 NOTE — Telephone Encounter (Signed)
Follow up     Status of referral to PCP.      daughter just got back in town realize that her mother weaning herself off her nasal canal.  Need to discuss

## 2014-01-26 NOTE — Telephone Encounter (Signed)
Left message for daughter to call me back in regards to her message.  Dr Lovena Le recommended Dr Lajean Manes

## 2014-01-29 ENCOUNTER — Telehealth: Payer: Self-pay | Admitting: *Deleted

## 2014-01-29 NOTE — Telephone Encounter (Signed)
Message copied by Jesse Fall on Fri Jan 29, 2014  8:52 AM ------      Message from: Annia Belt      Created: Mon Jan 25, 2014  8:26 PM       Call pt: red blood count low but no change from 1 month ago ------

## 2014-01-29 NOTE — Telephone Encounter (Signed)
[name], daughter, called back & was informed of pt's labs & no new orders for now.  Pt is at home now.  Daughter gave this # to contact her [phone].

## 2014-02-01 NOTE — Telephone Encounter (Signed)
Spoke with daughter and gave her the recommendations

## 2014-02-02 ENCOUNTER — Encounter: Payer: Self-pay | Admitting: Internal Medicine

## 2014-02-02 ENCOUNTER — Ambulatory Visit (INDEPENDENT_AMBULATORY_CARE_PROVIDER_SITE_OTHER): Payer: Medicare Other | Admitting: Internal Medicine

## 2014-02-02 VITALS — BP 118/72 | HR 64 | Ht 66.0 in | Wt 137.4 lb

## 2014-02-02 DIAGNOSIS — I10 Essential (primary) hypertension: Secondary | ICD-10-CM | POA: Diagnosis not present

## 2014-02-02 DIAGNOSIS — I5032 Chronic diastolic (congestive) heart failure: Secondary | ICD-10-CM | POA: Diagnosis not present

## 2014-02-02 DIAGNOSIS — I4891 Unspecified atrial fibrillation: Secondary | ICD-10-CM | POA: Diagnosis not present

## 2014-02-02 NOTE — Assessment & Plan Note (Signed)
Her symptoms are class 2. She will continue her current meds. I have asked her to stop her oxygen but to have it available if the oxygen saturation drops. They have a home pulse oximeter. She will try and maintain a low sodium diet.

## 2014-02-02 NOTE — Assessment & Plan Note (Signed)
Her blood pressure is well controlled. No change in medical therapy.

## 2014-02-02 NOTE — Patient Instructions (Signed)
Your physician wants you to follow-up in: 6 months with Dr Knox Saliva will receive a reminder letter in the mail two months in advance. If you don't receive a letter, please call our office to schedule the follow-up appointment.   You have been referred to [facility] office for Primary Care--to establish  With any MD

## 2014-02-02 NOTE — Assessment & Plan Note (Signed)
Her ventricular rate is well controlled. I would continue her currentmeds.

## 2014-02-02 NOTE — Progress Notes (Signed)
HPI [name] is referred today by Richardson Dopp for ongoing evaluation and management of atrial fibrillation. She is a very pleasant elderly woman with a h/o atrial fibrillation with a controlled VR, HTN, and diastolic CHF. She has been on chronic home oxygen due to desaturation prior to discharge from the hospital. She carries a diagnosis of pulmonary HTN. She denies chest pain or sob, though she admits to a fairly sedentary lifestyle. No peripheral edema while taking diuretic therapy. She has not had syncope.  She is not thought to be a candidate for coumadin due to chronic anemia from myelodysplastic disease. Allergies  Allergen Reactions  . Levaquin [Levofloxacin] Anaphylaxis  . Penicillins Anaphylaxis  . Sulfonamide Derivatives Anaphylaxis     Current Outpatient Prescriptions  Medication Sig Dispense Refill  . aspirin EC 325 MG tablet Take 1 tablet (325 mg total) by mouth 2 (two) times daily.  84 tablet  0  . furosemide (LASIX) 40 MG tablet Take 1 tablet (40 mg total) by mouth daily.  30 tablet  6  . levothyroxine (SYNTHROID, LEVOTHROID) 125 MCG tablet Take 125 mcg by mouth daily.      . metoprolol tartrate (LOPRESSOR) 25 MG tablet Take 0.5 tablets (12.5 mg total) by mouth 2 (two) times daily.  30 tablet  3  . NON FORMULARY Oxygen 2 Liters      . senna (SENOKOT) 8.6 MG TABS tablet Take 1 tablet (8.6 mg total) by mouth 2 (two) times daily.  120 each  0   No current facility-administered medications for this visit.     Past Medical History  Diagnosis Date  . MDS (myelodysplastic syndrome) 10/05/2011  . Pneumonia   . Thyroid disease   . Hypertension   . Atrial fibrillation dx Mar. 2010  . RBBB   . Pulmonary hypertension   . Chronic systolic heart failure   . Hypothyroidism   . Aortic stenosis   . Acute respiratory failure with hypoxia     ROS:   All systems reviewed and negative except as noted in the HPI.   Past Surgical History  Procedure Laterality Date  .  Orif femur fracture  2001    left  . Appendectomy    . Orif periprosthetic fracture Left 11/12/2013    Procedure: OPEN REDUCTION INTERNAL FIXATION (ORIF) LEFT HIP PERIPROSTHETIC FRACTURE,;  Surgeon: Marianna Payment, MD;  Location: [facility];  Service: Orthopedics;  Laterality: Left;     No family history on file.   History   Social History  . Marital Status: Widowed    Spouse Name: N/A    Number of Children: N/A  . Years of Education: N/A   Occupational History  . Not on file.   Social History Main Topics  . Smoking status: Never Smoker   . Smokeless tobacco: Never Used  . Alcohol Use: No  . Drug Use: No  . Sexual Activity: Not on file   Other Topics Concern  . Not on file   Social History Narrative  . No narrative on file     BP 118/72  Pulse 64  Ht 5\' 6"  (1.676 m)  Wt 137 lb 6.4 oz (62.324 kg)  BMI 22.19 kg/m2  Physical Exam: Pulse Ox 96% on room air Well appearing elderly woman, NAD HEENT: Unremarkable Neck:  No JVD, no thyromegally Back:  No CVA tenderness Lungs:  Clear with no wheezes, rales or rhonchi HEART:  IRegular rate rhythm, no murmurs, no rubs, no clicks, S2  is split. Abd:  soft, positive bowel sounds, no organomegally, no rebound, no guarding Ext:  2 plus pulses, no edema, no cyanosis, no clubbing Skin:  No rashes no nodules Neuro:  CN II through XII intact, motor grossly intact  EKG - atrial fib with a controlled VR and RBBB Assess/Plan:

## 2014-02-05 ENCOUNTER — Ambulatory Visit: Payer: Medicare Other | Admitting: Nurse Practitioner

## 2014-02-08 DIAGNOSIS — S72009A Fracture of unspecified part of neck of unspecified femur, initial encounter for closed fracture: Secondary | ICD-10-CM | POA: Diagnosis not present

## 2014-02-08 DIAGNOSIS — S72309B Unspecified fracture of shaft of unspecified femur, initial encounter for open fracture type I or II: Secondary | ICD-10-CM | POA: Diagnosis not present

## 2014-02-16 DIAGNOSIS — M79609 Pain in unspecified limb: Secondary | ICD-10-CM | POA: Diagnosis not present

## 2014-02-16 DIAGNOSIS — R209 Unspecified disturbances of skin sensation: Secondary | ICD-10-CM | POA: Diagnosis not present

## 2014-02-18 ENCOUNTER — Ambulatory Visit: Payer: Medicare Other | Admitting: Internal Medicine

## 2014-02-19 ENCOUNTER — Telehealth: Payer: Self-pay | Admitting: Hematology and Oncology

## 2014-02-19 ENCOUNTER — Encounter: Payer: Self-pay | Admitting: Hematology and Oncology

## 2014-02-19 ENCOUNTER — Ambulatory Visit (HOSPITAL_BASED_OUTPATIENT_CLINIC_OR_DEPARTMENT_OTHER): Payer: Medicare Other | Admitting: Hematology and Oncology

## 2014-02-19 ENCOUNTER — Other Ambulatory Visit (HOSPITAL_BASED_OUTPATIENT_CLINIC_OR_DEPARTMENT_OTHER): Payer: Medicare Other

## 2014-02-19 ENCOUNTER — Ambulatory Visit (HOSPITAL_BASED_OUTPATIENT_CLINIC_OR_DEPARTMENT_OTHER): Payer: Medicare Other

## 2014-02-19 ENCOUNTER — Ambulatory Visit: Payer: Medicare Other

## 2014-02-19 VITALS — BP 122/57 | HR 81 | Temp 97.6°F | Resp 18 | Ht 66.0 in | Wt 136.2 lb

## 2014-02-19 DIAGNOSIS — I509 Heart failure, unspecified: Secondary | ICD-10-CM

## 2014-02-19 DIAGNOSIS — D6489 Other specified anemias: Secondary | ICD-10-CM

## 2014-02-19 DIAGNOSIS — D4620 Refractory anemia with excess of blasts, unspecified: Secondary | ICD-10-CM

## 2014-02-19 DIAGNOSIS — D638 Anemia in other chronic diseases classified elsewhere: Secondary | ICD-10-CM

## 2014-02-19 DIAGNOSIS — D469 Myelodysplastic syndrome, unspecified: Secondary | ICD-10-CM

## 2014-02-19 LAB — CBC WITH DIFFERENTIAL/PLATELET
BASO%: 2.1 % — AB (ref 0.0–2.0)
Basophils Absolute: 0.1 10*3/uL (ref 0.0–0.1)
EOS%: 7.4 % — ABNORMAL HIGH (ref 0.0–7.0)
Eosinophils Absolute: 0.4 10*3/uL (ref 0.0–0.5)
HEMATOCRIT: 28.2 % — AB (ref 34.8–46.6)
HGB: 9.2 g/dL — ABNORMAL LOW (ref 11.6–15.9)
LYMPH%: 13.2 % — AB (ref 14.0–49.7)
MCH: 30.3 pg (ref 25.1–34.0)
MCHC: 32.5 g/dL (ref 31.5–36.0)
MCV: 93.2 fL (ref 79.5–101.0)
MONO#: 0.5 10*3/uL (ref 0.1–0.9)
MONO%: 10.3 % (ref 0.0–14.0)
NEUT#: 3.6 10*3/uL (ref 1.5–6.5)
NEUT%: 67 % (ref 38.4–76.8)
PLATELETS: 268 10*3/uL (ref 145–400)
RBC: 3.03 10*6/uL — AB (ref 3.70–5.45)
RDW: 21.4 % — ABNORMAL HIGH (ref 11.2–14.5)
WBC: 5.3 10*3/uL (ref 3.9–10.3)
lymph#: 0.7 10*3/uL — ABNORMAL LOW (ref 0.9–3.3)

## 2014-02-19 MED ORDER — DARBEPOETIN ALFA-POLYSORBATE 300 MCG/0.6ML IJ SOLN
300.0000 ug | Freq: Once | INTRAMUSCULAR | Status: AC
  Administered 2014-02-19: 300 ug via SUBCUTANEOUS
  Filled 2014-02-19: qty 0.6

## 2014-02-19 NOTE — Telephone Encounter (Signed)
gve the pt's dtr the April-sept 2015 appt calendar.

## 2014-02-19 NOTE — Progress Notes (Signed)
[place] OFFICE PROGRESS NOTE  Patient Care Team: Darlin Coco, MD as PCP - General (Cardiology)  DIAGNOSIS: Low-grade myelodysplastic syndrome with anemia, on going treatment with erythropoietin stimulating agents  SUMMARY OF ONCOLOGIC HISTORY: This pleasant lady was diagnosed with a low-grade myelodysplastic syndrome characterized by isolated normochromic anemia.  Bone marrow done 02/01/2009 was hypercellular for age. Mild dyserythropoiesis. 3% blasts. Normal cytogenetics. No ring sideroblasts. Hemoglobin 9.8 at that time with platelet count 367,000. Ferritin 329. Erythropoietin 69.9. She has had a durable response to intermittent erythropoietin injections currently on Aranesp 300 mcg every 4 weeks. We have been able to maintain hemoglobin at 10 g or above. In December 2014, the patient had hip fracture requiring surgery and blood transfusion.  INTERVAL HISTORY: Gina Wilkinson 80 y.o. female returns for further followup. She has been placed on oxygen therapy since her recent hospitalization. She saw her cardiologist recently who felt that oxygen may not be needed long-term. She denies any signs and symptoms of anemia such as chest pain, shortness of breath or dizziness. The patient denies any recent signs or symptoms of bleeding such as spontaneous epistaxis, hematuria or hematochezia.  I have reviewed the past medical history, past surgical history, social history and family history with the patient and they are unchanged from previous note.  ALLERGIES:  is allergic to levaquin; penicillins; and sulfonamide derivatives.  MEDICATIONS:  Current Outpatient Prescriptions  Medication Sig Dispense Refill  . aspirin 81 MG tablet Take 81 mg by mouth daily.      Marland Kitchen levothyroxine (SYNTHROID, LEVOTHROID) 125 MCG tablet Take 125 mcg by mouth daily.      . metoprolol tartrate (LOPRESSOR) 25 MG tablet Take 0.5 tablets (12.5 mg total) by mouth 2 (two) times daily.  30 tablet  3   . NON FORMULARY Oxygen 2 Liters      . senna (SENOKOT) 8.6 MG TABS tablet Take 1 tablet (8.6 mg total) by mouth 2 (two) times daily.  120 each  0  . furosemide (LASIX) 40 MG tablet Take 1 tablet (40 mg total) by mouth daily.  30 tablet  6   No current facility-administered medications for this visit.   Facility-Administered Medications Ordered in Other Visits  Medication Dose Route Frequency Provider Last Rate Last Dose  . darbepoetin (ARANESP) injection 300 mcg  300 mcg Subcutaneous Once Annia Belt, MD        REVIEW OF SYSTEMS:   Constitutional: Denies fevers, chills or abnormal weight loss Eyes: Denies blurriness of vision Ears, nose, mouth, throat, and face: Denies mucositis or sore throat Respiratory: Denies cough, dyspnea or wheezes Cardiovascular: Denies palpitation, chest discomfort or lower extremity swelling Gastrointestinal:  Denies nausea, heartburn or change in bowel habits Skin: Denies abnormal skin rashes Lymphatics: Denies new lymphadenopathy or easy bruising Neurological:Denies numbness, tingling or new weaknesses Behavioral/Psych: Mood is stable, no new changes  All other systems were reviewed with the patient and are negative.  PHYSICAL EXAMINATION: ECOG PERFORMANCE STATUS: 1 - Symptomatic but completely ambulatory  Filed Vitals:   02/19/14 1032  BP: 122/57  Pulse: 81  Temp: 97.6 F (36.4 C)  Resp: 18   Filed Weights   02/19/14 1032  Weight: 136 lb 3.2 oz (61.78 kg)    GENERAL:alert, no distress and comfortable. She looked elderly, thin and mildly cachectic SKIN: skin color, texture, turgor are normal, no rashes or significant lesions. Extensive bruises are noted. EYES: normal, Conjunctiva are pink and non-injected, sclera clear OROPHARYNX:no exudate, no erythema and  lips, buccal mucosa, and tongue normal  NECK: supple, thyroid normal size, non-tender, without nodularity LYMPH:  no palpable lymphadenopathy in the cervical, axillary or  inguinal LUNGS: clear to auscultation and percussion with normal breathing effort HEART: regular rate & rhythm and no murmurs and no lower extremity edema ABDOMEN:abdomen soft, non-tender and normal bowel sounds Musculoskeletal:no cyanosis of digits and no clubbing  NEURO: alert & oriented x 3 with fluent speech, no focal motor/sensory deficits  LABORATORY DATA:  I have reviewed the data as listed    Component Value Date/Time   NA 141 12/08/2013 1611   NA 142 09/04/2013 1507   K 4.0 12/08/2013 1611   K 4.0 09/04/2013 1507   CL 102 12/08/2013 1611   CL 105 07/25/2012 1005   CO2 33* 12/08/2013 1611   CO2 27 09/04/2013 1507   GLUCOSE 92 12/08/2013 1611   GLUCOSE 131 09/04/2013 1507   GLUCOSE 142* 07/25/2012 1005   BUN 23 12/08/2013 1611   BUN 20.0 09/04/2013 1507   CREATININE 1.1 12/08/2013 1611   CREATININE 1.0 09/04/2013 1507   CREATININE 1.10 03/15/2009 1014   CALCIUM 9.1 12/08/2013 1611   CALCIUM 9.4 09/04/2013 1507   PROT 7.2 09/04/2013 1507   PROT 7.4 09/06/2012 1250   ALBUMIN 3.5 09/04/2013 1507   ALBUMIN 3.7 09/06/2012 1250   AST 18 09/04/2013 1507   AST 25 09/06/2012 1250   ALT 10 09/04/2013 1507   ALT 14 09/06/2012 1250   ALKPHOS 66 09/04/2013 1507   ALKPHOS 64 09/06/2012 1250   BILITOT 1.38* 09/04/2013 1507   BILITOT 1.2 09/06/2012 1250   GFRNONAA 34* 11/18/2013 0453   GFRAA 39* 11/18/2013 0453    No results found for this basename: SPEP,  UPEP,   kappa and lambda light chains    Lab Results  Component Value Date   WBC 5.3 02/19/2014   NEUTROABS 3.6 02/19/2014   HGB 9.2* 02/19/2014   HCT 28.2* 02/19/2014   MCV 93.2 02/19/2014   PLT 268 02/19/2014      Chemistry      Component Value Date/Time   NA 141 12/08/2013 1611   NA 142 09/04/2013 1507   K 4.0 12/08/2013 1611   K 4.0 09/04/2013 1507   CL 102 12/08/2013 1611   CL 105 07/25/2012 1005   CO2 33* 12/08/2013 1611   CO2 27 09/04/2013 1507   BUN 23 12/08/2013 1611   BUN 20.0 09/04/2013 1507   CREATININE 1.1  12/08/2013 1611   CREATININE 1.0 09/04/2013 1507   CREATININE 1.10 03/15/2009 1014      Component Value Date/Time   CALCIUM 9.1 12/08/2013 1611   CALCIUM 9.4 09/04/2013 1507   ALKPHOS 66 09/04/2013 1507   ALKPHOS 64 09/06/2012 1250   AST 18 09/04/2013 1507   AST 25 09/06/2012 1250   ALT 10 09/04/2013 1507   ALT 14 09/06/2012 1250   BILITOT 1.38* 09/04/2013 1507   BILITOT 1.2 09/06/2012 1250     ASSESSMENT & PLAN:  #1 low-grade myelodysplastic syndrome #2 chronic anemia This is likely anemia of chronic disease. The patient denies recent history of bleeding such as epistaxis, hematuria or hematochezia. She is asymptomatic from the anemia. We will observe for now.  She does not require transfusion now.  Her hemoglobin is improving. I recommend we continue giving her intermittent Aranesp injection 300 mcg every 4 weeks and I will see her in 6 months. #3 chronic heart failure, history of atrial fibrillation and pulmonary hypertension the patient is  not on anticoagulation therapy. She is currently on oxygen therapy. She will continue her cardiac medications as directed by her cardiologist.  Orders Placed This Encounter  Procedures  . CBC & Diff and Retic    Standing Status: Standing     Number of Occurrences: 9     Standing Expiration Date: 02/20/2015   All questions were answered. The patient knows to call the clinic with any problems, questions or concerns. No barriers to learning was detected. I spent 15 minutes counseling the patient face to face. The total time spent in the appointment was 20 minutes and more than 50% was on counseling and review of test results     [facility], [place], MD 02/19/2014 10:52 AM

## 2014-02-26 DIAGNOSIS — R269 Unspecified abnormalities of gait and mobility: Secondary | ICD-10-CM | POA: Diagnosis not present

## 2014-02-26 DIAGNOSIS — R262 Difficulty in walking, not elsewhere classified: Secondary | ICD-10-CM | POA: Diagnosis not present

## 2014-02-26 DIAGNOSIS — M6281 Muscle weakness (generalized): Secondary | ICD-10-CM | POA: Diagnosis not present

## 2014-03-01 ENCOUNTER — Telehealth: Payer: Self-pay | Admitting: Hematology and Oncology

## 2014-03-01 DIAGNOSIS — R262 Difficulty in walking, not elsewhere classified: Secondary | ICD-10-CM | POA: Diagnosis not present

## 2014-03-01 DIAGNOSIS — M6281 Muscle weakness (generalized): Secondary | ICD-10-CM | POA: Diagnosis not present

## 2014-03-01 DIAGNOSIS — R269 Unspecified abnormalities of gait and mobility: Secondary | ICD-10-CM | POA: Diagnosis not present

## 2014-03-01 NOTE — Telephone Encounter (Signed)
pt dtr called to r/s 6/26 appt to 6/25 - dtr has new d/t

## 2014-03-03 DIAGNOSIS — M6281 Muscle weakness (generalized): Secondary | ICD-10-CM | POA: Diagnosis not present

## 2014-03-03 DIAGNOSIS — R262 Difficulty in walking, not elsewhere classified: Secondary | ICD-10-CM | POA: Diagnosis not present

## 2014-03-03 DIAGNOSIS — R269 Unspecified abnormalities of gait and mobility: Secondary | ICD-10-CM | POA: Diagnosis not present

## 2014-03-08 DIAGNOSIS — M6281 Muscle weakness (generalized): Secondary | ICD-10-CM | POA: Diagnosis not present

## 2014-03-08 DIAGNOSIS — R269 Unspecified abnormalities of gait and mobility: Secondary | ICD-10-CM | POA: Diagnosis not present

## 2014-03-08 DIAGNOSIS — R262 Difficulty in walking, not elsewhere classified: Secondary | ICD-10-CM | POA: Diagnosis not present

## 2014-03-10 DIAGNOSIS — R269 Unspecified abnormalities of gait and mobility: Secondary | ICD-10-CM | POA: Diagnosis not present

## 2014-03-10 DIAGNOSIS — R262 Difficulty in walking, not elsewhere classified: Secondary | ICD-10-CM | POA: Diagnosis not present

## 2014-03-10 DIAGNOSIS — M6281 Muscle weakness (generalized): Secondary | ICD-10-CM | POA: Diagnosis not present

## 2014-03-15 DIAGNOSIS — M6281 Muscle weakness (generalized): Secondary | ICD-10-CM | POA: Diagnosis not present

## 2014-03-15 DIAGNOSIS — R269 Unspecified abnormalities of gait and mobility: Secondary | ICD-10-CM | POA: Diagnosis not present

## 2014-03-15 DIAGNOSIS — R262 Difficulty in walking, not elsewhere classified: Secondary | ICD-10-CM | POA: Diagnosis not present

## 2014-03-17 DIAGNOSIS — R262 Difficulty in walking, not elsewhere classified: Secondary | ICD-10-CM | POA: Diagnosis not present

## 2014-03-17 DIAGNOSIS — R269 Unspecified abnormalities of gait and mobility: Secondary | ICD-10-CM | POA: Diagnosis not present

## 2014-03-17 DIAGNOSIS — M6281 Muscle weakness (generalized): Secondary | ICD-10-CM | POA: Diagnosis not present

## 2014-03-19 ENCOUNTER — Ambulatory Visit (HOSPITAL_BASED_OUTPATIENT_CLINIC_OR_DEPARTMENT_OTHER): Payer: Medicare Other

## 2014-03-19 ENCOUNTER — Other Ambulatory Visit (HOSPITAL_BASED_OUTPATIENT_CLINIC_OR_DEPARTMENT_OTHER): Payer: Medicare Other

## 2014-03-19 VITALS — BP 117/70 | HR 56 | Temp 97.5°F

## 2014-03-19 DIAGNOSIS — D469 Myelodysplastic syndrome, unspecified: Secondary | ICD-10-CM

## 2014-03-19 LAB — CBC & DIFF AND RETIC
BASO%: 1.3 % (ref 0.0–2.0)
Basophils Absolute: 0.1 10*3/uL (ref 0.0–0.1)
EOS ABS: 0.3 10*3/uL (ref 0.0–0.5)
EOS%: 5 % (ref 0.0–7.0)
HCT: 30 % — ABNORMAL LOW (ref 34.8–46.6)
HEMOGLOBIN: 9.7 g/dL — AB (ref 11.6–15.9)
IMMATURE RETIC FRACT: 2.2 % (ref 1.60–10.00)
LYMPH#: 0.9 10*3/uL (ref 0.9–3.3)
LYMPH%: 16.8 % (ref 14.0–49.7)
MCH: 30.2 pg (ref 25.1–34.0)
MCHC: 32.3 g/dL (ref 31.5–36.0)
MCV: 93.5 fL (ref 79.5–101.0)
MONO#: 0.6 10*3/uL (ref 0.1–0.9)
MONO%: 11.1 % (ref 0.0–14.0)
NEUT%: 65.8 % (ref 38.4–76.8)
NEUTROS ABS: 3.5 10*3/uL (ref 1.5–6.5)
Platelets: 229 10*3/uL (ref 145–400)
RBC: 3.21 10*6/uL — AB (ref 3.70–5.45)
RDW: 17.8 % — AB (ref 11.2–14.5)
RETIC %: 1.26 % (ref 0.70–2.10)
RETIC CT ABS: 40.45 10*3/uL (ref 33.70–90.70)
WBC: 5.2 10*3/uL (ref 3.9–10.3)

## 2014-03-19 MED ORDER — DARBEPOETIN ALFA-POLYSORBATE 300 MCG/0.6ML IJ SOLN
300.0000 ug | Freq: Once | INTRAMUSCULAR | Status: AC
  Administered 2014-03-19: 300 ug via SUBCUTANEOUS
  Filled 2014-03-19: qty 0.6

## 2014-03-24 DIAGNOSIS — M6281 Muscle weakness (generalized): Secondary | ICD-10-CM | POA: Diagnosis not present

## 2014-03-24 DIAGNOSIS — R262 Difficulty in walking, not elsewhere classified: Secondary | ICD-10-CM | POA: Diagnosis not present

## 2014-03-24 DIAGNOSIS — R269 Unspecified abnormalities of gait and mobility: Secondary | ICD-10-CM | POA: Diagnosis not present

## 2014-03-26 DIAGNOSIS — M6281 Muscle weakness (generalized): Secondary | ICD-10-CM | POA: Diagnosis not present

## 2014-03-26 DIAGNOSIS — R269 Unspecified abnormalities of gait and mobility: Secondary | ICD-10-CM | POA: Diagnosis not present

## 2014-03-26 DIAGNOSIS — R262 Difficulty in walking, not elsewhere classified: Secondary | ICD-10-CM | POA: Diagnosis not present

## 2014-04-16 ENCOUNTER — Other Ambulatory Visit: Payer: Self-pay | Admitting: Hematology and Oncology

## 2014-04-16 ENCOUNTER — Ambulatory Visit (HOSPITAL_BASED_OUTPATIENT_CLINIC_OR_DEPARTMENT_OTHER): Payer: Medicare Other

## 2014-04-16 ENCOUNTER — Other Ambulatory Visit (HOSPITAL_BASED_OUTPATIENT_CLINIC_OR_DEPARTMENT_OTHER): Payer: Medicare Other

## 2014-04-16 VITALS — BP 135/45 | HR 69 | Temp 97.4°F

## 2014-04-16 DIAGNOSIS — D649 Anemia, unspecified: Secondary | ICD-10-CM

## 2014-04-16 DIAGNOSIS — D469 Myelodysplastic syndrome, unspecified: Secondary | ICD-10-CM

## 2014-04-16 LAB — CBC & DIFF AND RETIC
BASO%: 1.1 % (ref 0.0–2.0)
Basophils Absolute: 0.1 10*3/uL (ref 0.0–0.1)
EOS%: 6.6 % (ref 0.0–7.0)
Eosinophils Absolute: 0.4 10*3/uL (ref 0.0–0.5)
HCT: 28.4 % — ABNORMAL LOW (ref 34.8–46.6)
HGB: 9 g/dL — ABNORMAL LOW (ref 11.6–15.9)
Immature Retic Fract: 5.7 % (ref 1.60–10.00)
LYMPH#: 1 10*3/uL (ref 0.9–3.3)
LYMPH%: 16.8 % (ref 14.0–49.7)
MCH: 30 pg (ref 25.1–34.0)
MCHC: 31.7 g/dL (ref 31.5–36.0)
MCV: 94.7 fL (ref 79.5–101.0)
MONO#: 0.6 10*3/uL (ref 0.1–0.9)
MONO%: 8.9 % (ref 0.0–14.0)
NEUT%: 66.6 % (ref 38.4–76.8)
NEUTROS ABS: 4.1 10*3/uL (ref 1.5–6.5)
NRBC: 0 % (ref 0–0)
Platelets: 224 10*3/uL (ref 145–400)
RBC: 3 10*6/uL — AB (ref 3.70–5.45)
RDW: 18.3 % — ABNORMAL HIGH (ref 11.2–14.5)
RETIC CT ABS: 62.1 10*3/uL (ref 33.70–90.70)
Retic %: 2.07 % (ref 0.70–2.10)
WBC: 6.2 10*3/uL (ref 3.9–10.3)

## 2014-04-16 MED ORDER — DARBEPOETIN ALFA-POLYSORBATE 500 MCG/ML IJ SOLN
500.0000 ug | Freq: Once | INTRAMUSCULAR | Status: AC
  Administered 2014-04-16: 500 ug via SUBCUTANEOUS
  Filled 2014-04-16: qty 1

## 2014-04-16 MED ORDER — DARBEPOETIN ALFA-POLYSORBATE 300 MCG/0.6ML IJ SOLN
300.0000 ug | Freq: Once | INTRAMUSCULAR | Status: DC
  Filled 2014-04-16: qty 0.6

## 2014-04-16 NOTE — Patient Instructions (Signed)
Darbepoetin Alfa injection What is this medicine? DARBEPOETIN ALFA (dar be POE e tin AL fa) helps your body make more red blood cells. It is used to treat anemia caused by chronic kidney failure and chemotherapy. This medicine may be used for other purposes; ask your health care provider or pharmacist if you have questions. COMMON BRAND NAME(S): Aranesp What should I tell my health care provider before I take this medicine? They need to know if you have any of these conditions: -blood clotting disorders or history of blood clots -cancer patient not on chemotherapy -cystic fibrosis -heart disease, such as angina, heart failure, or a history of a heart attack -hemoglobin level of 12 g/dL or greater -high blood pressure -low levels of folate, iron, or vitamin B12 -seizures -an unusual or allergic reaction to darbepoetin, erythropoietin, albumin, hamster proteins, latex, other medicines, foods, dyes, or preservatives -pregnant or trying to get pregnant -breast-feeding How should I use this medicine? This medicine is for injection into a vein or under the skin. It is usually given by a health care professional in a hospital or clinic setting. If you get this medicine at home, you will be taught how to prepare and give this medicine. Do not shake the solution before you withdraw a dose. Use exactly as directed. Take your medicine at regular intervals. Do not take your medicine more often than directed. It is important that you put your used needles and syringes in a special sharps container. Do not put them in a trash can. If you do not have a sharps container, call your pharmacist or healthcare provider to get one. Talk to your pediatrician regarding the use of this medicine in children. While this medicine may be used in children as young as 1 year for selected conditions, precautions do apply. Overdosage: If you think you have taken too much of this medicine contact a poison control center or  emergency room at once. NOTE: This medicine is only for you. Do not share this medicine with others. What if I miss a dose? If you miss a dose, take it as soon as you can. If it is almost time for your next dose, take only that dose. Do not take double or extra doses. What may interact with this medicine? Do not take this medicine with any of the following medications: -epoetin alfa This list may not describe all possible interactions. Give your health care provider a list of all the medicines, herbs, non-prescription drugs, or dietary supplements you use. Also tell them if you smoke, drink alcohol, or use illegal drugs. Some items may interact with your medicine. What should I watch for while using this medicine? Visit your prescriber or health care professional for regular checks on your progress and for the needed blood tests and blood pressure measurements. It is especially important for the doctor to make sure your hemoglobin level is in the desired range, to limit the risk of potential side effects and to give you the best benefit. Keep all appointments for any recommended tests. Check your blood pressure as directed. Ask your doctor what your blood pressure should be and when you should contact him or her. As your body makes more red blood cells, you may need to take iron, folic acid, or vitamin B supplements. Ask your doctor or health care provider which products are right for you. If you have kidney disease continue dietary restrictions, even though this medication can make you feel better. Talk with your doctor or health   care professional about the foods you eat and the vitamins that you take. What side effects may I notice from receiving this medicine? Side effects that you should report to your doctor or health care professional as soon as possible: -allergic reactions like skin rash, itching or hives, swelling of the face, lips, or tongue -breathing problems -changes in vision -chest  pain -confusion, trouble speaking or understanding -feeling faint or lightheaded, falls -high blood pressure -muscle aches or pains -pain, swelling, warmth in the leg -rapid weight gain -severe headaches -sudden numbness or weakness of the face, arm or leg -trouble walking, dizziness, loss of balance or coordination -seizures (convulsions) -swelling of the ankles, feet, hands -unusually weak or tired Side effects that usually do not require medical attention (report to your doctor or health care professional if they continue or are bothersome): -diarrhea -fever, chills (flu-like symptoms) -headaches -nausea, vomiting -redness, stinging, or swelling at site where injected This list may not describe all possible side effects. Call your doctor for medical advice about side effects. You may report side effects to FDA at 1-800-FDA-1088. Where should I keep my medicine? Keep out of the reach of children. Store in a refrigerator between 2 and 8 degrees C (36 and 46 degrees F). Do not freeze. Do not shake. Throw away any unused portion if using a single-dose vial. Throw away any unused medicine after the expiration date. NOTE: This sheet is a summary. It may not cover all possible information. If you have questions about this medicine, talk to your doctor, pharmacist, or health care provider.  2014, Elsevier/Gold Standard. (2008-10-26 10:23:57)  

## 2014-04-26 ENCOUNTER — Telehealth: Payer: Self-pay | Admitting: *Deleted

## 2014-04-26 NOTE — Telephone Encounter (Signed)
Should be every 4 weeks, pls see if scheduling can fix it

## 2014-04-26 NOTE — Telephone Encounter (Signed)
Dau asks if it is supposed to be 5 weeks between pt's lab/injection appts?  States pts next lab/injection on 6/25 is 5 weeks from her last one on 5/22.  States she thought it was supposed to be every 4 weeks.

## 2014-04-28 ENCOUNTER — Telehealth: Payer: Self-pay | Admitting: *Deleted

## 2014-04-28 ENCOUNTER — Telehealth: Payer: Self-pay | Admitting: Hematology and Oncology

## 2014-04-28 NOTE — Telephone Encounter (Signed)
Daughter called about pt's lab/injections are not scheduled every 4 weeks as ordered. Next one on 6/26 is 5 wks from last one.  Per Dr. Alvy Bimler it is supposed to be every 4 weeks.  Sent POF to scheduling to adjust.  Informed dau of new order sent to scheduler and next appt should be on 6/19.  She verbalized understanding.

## 2014-04-28 NOTE — Telephone Encounter (Signed)
s.wl pt daught and r/s all appt...done per pof

## 2014-05-14 ENCOUNTER — Ambulatory Visit (HOSPITAL_BASED_OUTPATIENT_CLINIC_OR_DEPARTMENT_OTHER): Payer: Medicare Other

## 2014-05-14 ENCOUNTER — Other Ambulatory Visit (HOSPITAL_BASED_OUTPATIENT_CLINIC_OR_DEPARTMENT_OTHER): Payer: Medicare Other

## 2014-05-14 VITALS — BP 116/54 | HR 74 | Temp 98.2°F

## 2014-05-14 DIAGNOSIS — D469 Myelodysplastic syndrome, unspecified: Secondary | ICD-10-CM

## 2014-05-14 DIAGNOSIS — D649 Anemia, unspecified: Secondary | ICD-10-CM

## 2014-05-14 LAB — CBC & DIFF AND RETIC
BASO%: 1 % (ref 0.0–2.0)
Basophils Absolute: 0.1 10*3/uL (ref 0.0–0.1)
EOS ABS: 0.4 10*3/uL (ref 0.0–0.5)
EOS%: 9 % — AB (ref 0.0–7.0)
HCT: 30.5 % — ABNORMAL LOW (ref 34.8–46.6)
HGB: 9.9 g/dL — ABNORMAL LOW (ref 11.6–15.9)
Immature Retic Fract: 2.1 % (ref 1.60–10.00)
LYMPH%: 20.5 % (ref 14.0–49.7)
MCH: 30.5 pg (ref 25.1–34.0)
MCHC: 32.5 g/dL (ref 31.5–36.0)
MCV: 93.8 fL (ref 79.5–101.0)
MONO#: 0.4 10*3/uL (ref 0.1–0.9)
MONO%: 8.8 % (ref 0.0–14.0)
NEUT#: 2.9 10*3/uL (ref 1.5–6.5)
NEUT%: 60.7 % (ref 38.4–76.8)
PLATELETS: 205 10*3/uL (ref 145–400)
RBC: 3.25 10*6/uL — ABNORMAL LOW (ref 3.70–5.45)
RDW: 16.7 % — ABNORMAL HIGH (ref 11.2–14.5)
RETIC %: 1.16 % (ref 0.70–2.10)
Retic Ct Abs: 37.7 10*3/uL (ref 33.70–90.70)
WBC: 4.8 10*3/uL (ref 3.9–10.3)
lymph#: 1 10*3/uL (ref 0.9–3.3)

## 2014-05-14 MED ORDER — DARBEPOETIN ALFA-POLYSORBATE 300 MCG/0.6ML IJ SOLN
500.0000 ug | Freq: Once | INTRAMUSCULAR | Status: AC
  Administered 2014-05-14: 500 ug via SUBCUTANEOUS
  Filled 2014-05-14: qty 1.2

## 2014-05-20 ENCOUNTER — Ambulatory Visit: Payer: Medicare Other

## 2014-05-20 ENCOUNTER — Other Ambulatory Visit: Payer: Medicare Other

## 2014-05-21 ENCOUNTER — Other Ambulatory Visit: Payer: Medicare Other

## 2014-05-21 ENCOUNTER — Ambulatory Visit: Payer: Medicare Other

## 2014-06-01 DIAGNOSIS — S72309B Unspecified fracture of shaft of unspecified femur, initial encounter for open fracture type I or II: Secondary | ICD-10-CM | POA: Diagnosis not present

## 2014-06-11 ENCOUNTER — Other Ambulatory Visit (HOSPITAL_BASED_OUTPATIENT_CLINIC_OR_DEPARTMENT_OTHER): Payer: Medicare Other

## 2014-06-11 ENCOUNTER — Ambulatory Visit: Payer: Medicare Other

## 2014-06-11 DIAGNOSIS — D469 Myelodysplastic syndrome, unspecified: Secondary | ICD-10-CM

## 2014-06-11 DIAGNOSIS — D649 Anemia, unspecified: Secondary | ICD-10-CM

## 2014-06-11 LAB — CBC & DIFF AND RETIC
BASO%: 1.2 % (ref 0.0–2.0)
Basophils Absolute: 0.1 10*3/uL (ref 0.0–0.1)
EOS ABS: 0.3 10*3/uL (ref 0.0–0.5)
EOS%: 6.5 % (ref 0.0–7.0)
HEMATOCRIT: 31.6 % — AB (ref 34.8–46.6)
HGB: 10.2 g/dL — ABNORMAL LOW (ref 11.6–15.9)
Immature Retic Fract: 3.6 % (ref 1.60–10.00)
LYMPH%: 16.8 % (ref 14.0–49.7)
MCH: 30.1 pg (ref 25.1–34.0)
MCHC: 32.3 g/dL (ref 31.5–36.0)
MCV: 93.2 fL (ref 79.5–101.0)
MONO#: 0.4 10*3/uL (ref 0.1–0.9)
MONO%: 9.1 % (ref 0.0–14.0)
NEUT%: 66.4 % (ref 38.4–76.8)
NEUTROS ABS: 2.9 10*3/uL (ref 1.5–6.5)
PLATELETS: 185 10*3/uL (ref 145–400)
RBC: 3.39 10*6/uL — ABNORMAL LOW (ref 3.70–5.45)
RDW: 16.6 % — AB (ref 11.2–14.5)
RETIC CT ABS: 30.85 10*3/uL — AB (ref 33.70–90.70)
Retic %: 0.91 % (ref 0.70–2.10)
WBC: 4.3 10*3/uL (ref 3.9–10.3)
lymph#: 0.7 10*3/uL — ABNORMAL LOW (ref 0.9–3.3)

## 2014-06-11 MED ORDER — DARBEPOETIN ALFA-POLYSORBATE 300 MCG/0.6ML IJ SOLN: 500.0000 ug | Freq: Once | INTRAMUSCULAR | Status: DC

## 2014-06-11 NOTE — Progress Notes (Signed)
HGB 10.2 today  Will hold Aranesp and schedule next lab and appointment for 3 weeks.

## 2014-06-14 ENCOUNTER — Other Ambulatory Visit: Payer: Self-pay

## 2014-06-14 MED ORDER — METOPROLOL TARTRATE 25 MG PO TABS: 12.5000 mg | ORAL_TABLET | Freq: Two times a day (BID) | ORAL | Status: DC

## 2014-06-18 ENCOUNTER — Other Ambulatory Visit: Payer: Medicare Other

## 2014-06-18 ENCOUNTER — Ambulatory Visit: Payer: Medicare Other

## 2014-07-02 ENCOUNTER — Ambulatory Visit: Payer: Medicare Other

## 2014-07-02 ENCOUNTER — Other Ambulatory Visit (HOSPITAL_BASED_OUTPATIENT_CLINIC_OR_DEPARTMENT_OTHER): Payer: Medicare Other

## 2014-07-02 DIAGNOSIS — D469 Myelodysplastic syndrome, unspecified: Secondary | ICD-10-CM

## 2014-07-02 DIAGNOSIS — D649 Anemia, unspecified: Secondary | ICD-10-CM

## 2014-07-02 LAB — CBC & DIFF AND RETIC
BASO%: 0.5 % (ref 0.0–2.0)
BASOS ABS: 0 10*3/uL (ref 0.0–0.1)
EOS%: 6 % (ref 0.0–7.0)
Eosinophils Absolute: 0.3 10*3/uL (ref 0.0–0.5)
HEMATOCRIT: 31.4 % — AB (ref 34.8–46.6)
HEMOGLOBIN: 10 g/dL — AB (ref 11.6–15.9)
IMMATURE RETIC FRACT: 3.9 % (ref 1.60–10.00)
LYMPH%: 20.9 % (ref 14.0–49.7)
MCH: 29.9 pg (ref 25.1–34.0)
MCHC: 31.8 g/dL (ref 31.5–36.0)
MCV: 93.7 fL (ref 79.5–101.0)
MONO#: 0.5 10*3/uL (ref 0.1–0.9)
MONO%: 8.4 % (ref 0.0–14.0)
NEUT%: 64.2 % (ref 38.4–76.8)
NEUTROS ABS: 3.5 10*3/uL (ref 1.5–6.5)
Platelets: 204 10*3/uL (ref 145–400)
RBC: 3.35 10*6/uL — ABNORMAL LOW (ref 3.70–5.45)
RDW: 16.8 % — ABNORMAL HIGH (ref 11.2–14.5)
RETIC CT ABS: 49.58 10*3/uL (ref 33.70–90.70)
Retic %: 1.48 % (ref 0.70–2.10)
WBC: 5.5 10*3/uL (ref 3.9–10.3)
lymph#: 1.1 10*3/uL (ref 0.9–3.3)

## 2014-07-02 MED ORDER — DARBEPOETIN ALFA-POLYSORBATE 500 MCG/ML IJ SOLN
500.0000 ug | Freq: Once | INTRAMUSCULAR | Status: DC
  Filled 2014-07-02: qty 1

## 2014-07-09 ENCOUNTER — Other Ambulatory Visit: Payer: Medicare Other

## 2014-07-09 ENCOUNTER — Ambulatory Visit: Payer: Medicare Other

## 2014-07-23 ENCOUNTER — Ambulatory Visit: Payer: Medicare Other

## 2014-07-23 ENCOUNTER — Other Ambulatory Visit: Payer: Medicare Other

## 2014-07-27 ENCOUNTER — Telehealth: Payer: Self-pay | Admitting: Hematology and Oncology

## 2014-07-27 ENCOUNTER — Encounter: Payer: Self-pay | Admitting: Hematology and Oncology

## 2014-07-27 ENCOUNTER — Ambulatory Visit (HOSPITAL_BASED_OUTPATIENT_CLINIC_OR_DEPARTMENT_OTHER): Payer: Medicare Other

## 2014-07-27 ENCOUNTER — Other Ambulatory Visit (HOSPITAL_BASED_OUTPATIENT_CLINIC_OR_DEPARTMENT_OTHER): Payer: Medicare Other

## 2014-07-27 ENCOUNTER — Ambulatory Visit (HOSPITAL_BASED_OUTPATIENT_CLINIC_OR_DEPARTMENT_OTHER): Payer: Medicare Other | Admitting: Hematology and Oncology

## 2014-07-27 VITALS — BP 108/54 | HR 75 | Temp 98.1°F | Resp 18 | Ht 66.0 in | Wt 139.5 lb

## 2014-07-27 DIAGNOSIS — D469 Myelodysplastic syndrome, unspecified: Secondary | ICD-10-CM | POA: Diagnosis not present

## 2014-07-27 DIAGNOSIS — I10 Essential (primary) hypertension: Secondary | ICD-10-CM

## 2014-07-27 DIAGNOSIS — D649 Anemia, unspecified: Secondary | ICD-10-CM

## 2014-07-27 DIAGNOSIS — D539 Nutritional anemia, unspecified: Secondary | ICD-10-CM

## 2014-07-27 HISTORY — DX: Anemia, unspecified: D64.9

## 2014-07-27 LAB — CBC & DIFF AND RETIC
BASO%: 0.7 % (ref 0.0–2.0)
BASOS ABS: 0 10*3/uL (ref 0.0–0.1)
EOS ABS: 0.4 10*3/uL (ref 0.0–0.5)
EOS%: 7.2 % — ABNORMAL HIGH (ref 0.0–7.0)
HEMATOCRIT: 30.3 % — AB (ref 34.8–46.6)
HEMOGLOBIN: 9.8 g/dL — AB (ref 11.6–15.9)
IMMATURE RETIC FRACT: 5.5 % (ref 1.60–10.00)
LYMPH#: 1 10*3/uL (ref 0.9–3.3)
LYMPH%: 17.1 % (ref 14.0–49.7)
MCH: 30.3 pg (ref 25.1–34.0)
MCHC: 32.3 g/dL (ref 31.5–36.0)
MCV: 93.8 fL (ref 79.5–101.0)
MONO#: 0.5 10*3/uL (ref 0.1–0.9)
MONO%: 9.5 % (ref 0.0–14.0)
NEUT%: 65.5 % (ref 38.4–76.8)
NEUTROS ABS: 3.7 10*3/uL (ref 1.5–6.5)
Platelets: 235 10*3/uL (ref 145–400)
RBC: 3.23 10*6/uL — ABNORMAL LOW (ref 3.70–5.45)
RDW: 16.9 % — AB (ref 11.2–14.5)
RETIC CT ABS: 68.48 10*3/uL (ref 33.70–90.70)
Retic %: 2.12 % — ABNORMAL HIGH (ref 0.70–2.10)
WBC: 5.7 10*3/uL (ref 3.9–10.3)

## 2014-07-27 MED ORDER — DARBEPOETIN ALFA-POLYSORBATE 500 MCG/ML IJ SOLN
500.0000 ug | Freq: Once | INTRAMUSCULAR | Status: AC
  Administered 2014-07-27: 500 ug via SUBCUTANEOUS
  Filled 2014-07-27: qty 1

## 2014-07-27 NOTE — Assessment & Plan Note (Signed)
She has minimum response to Darbopoeitin 300 mcg. Since we increased to 500 mcg, she has skipped 2 doses. I will lengthen her appointment to every 4 weeks and will continue on darbepoetin and 500 mcg to keep hemoglobin greater than 10 g. At the end of the year, I would recheck serum vitamin B12 and iron studies. I will see her back in 6 with history, physical examination and blood work.

## 2014-07-27 NOTE — Progress Notes (Signed)
[place] OFFICE PROGRESS NOTE  Patient Care Team: Darlin Coco, MD as PCP - General (Cardiology)  SUMMARY OF ONCOLOGIC HISTORY: This pleasant lady was diagnosed with a low-grade myelodysplastic syndrome characterized by isolated normochromic anemia.  Bone marrow done 02/01/2009 was hypercellular for age. Mild dyserythropoiesis. 3% blasts. Normal cytogenetics. No ring sideroblasts. Hemoglobin 9.8 at that time with platelet count 367,000. Ferritin 329. Erythropoietin 69.9. She has had a durable response to intermittent erythropoietin injections currently on Aranesp 300 mcg every 4 weeks. We have been able to maintain hemoglobin at 10 g or above. In December 2014, the patient had hip fracture requiring surgery and blood transfusion. In May 2015, we increased Darbopoetin to 500 mcg every 4 weeks. INTERVAL HISTORY: Please see below for problem oriented charting. She feels well. She has excellent energy level. Denies any headaches. She denies recent infection.  REVIEW OF SYSTEMS:   Constitutional: Denies fevers, chills or abnormal weight loss Eyes: Denies blurriness of vision Ears, nose, mouth, throat, and face: Denies mucositis or sore throat Respiratory: Denies cough, dyspnea or wheezes Cardiovascular: Denies palpitation, chest discomfort or lower extremity swelling Gastrointestinal:  Denies nausea, heartburn or change in bowel habits Skin: Denies abnormal skin rashes Lymphatics: Denies new lymphadenopathy or easy bruising Neurological:Denies numbness, tingling or new weaknesses Behavioral/Psych: Mood is stable, no new changes  All other systems were reviewed with the patient and are negative.  I have reviewed the past medical history, past surgical history, social history and family history with the patient and they are unchanged from previous note.  ALLERGIES:  is allergic to levaquin; penicillins; and sulfonamide derivatives.  MEDICATIONS:  Current Outpatient  Prescriptions  Medication Sig Dispense Refill  . aspirin 81 MG tablet Take 81 mg by mouth daily.      . furosemide (LASIX) 40 MG tablet Take 1 tablet (40 mg total) by mouth daily.  30 tablet  6  . levothyroxine (SYNTHROID, LEVOTHROID) 125 MCG tablet Take 125 mcg by mouth daily.      . metoprolol tartrate (LOPRESSOR) 25 MG tablet Take 0.5 tablets (12.5 mg total) by mouth 2 (two) times daily.  30 tablet  3  . NON FORMULARY Oxygen 2 Liters      . senna (SENOKOT) 8.6 MG TABS tablet Take 1 tablet (8.6 mg total) by mouth 2 (two) times daily.  120 each  0   No current facility-administered medications for this visit.   Facility-Administered Medications Ordered in Other Visits  Medication Dose Route Frequency Provider Last Rate Last Dose  . darbepoetin alfa-polysorbate (ARANESP) injection 500 mcg  500 mcg Subcutaneous Once Heath Lark, MD        PHYSICAL EXAMINATION: ECOG PERFORMANCE STATUS: 0 - Asymptomatic  Filed Vitals:   07/27/14 1309  BP: 108/54  Pulse: 75  Temp: 98.1 F (36.7 C)  Resp: 18   Filed Weights   07/27/14 1309  Weight: 139 lb 8 oz (63.277 kg)    GENERAL:alert, no distress and comfortable SKIN: skin color, texture, turgor are normal, no rashes or significant lesions EYES: normal, Conjunctiva are pink and non-injected, sclera clear Musculoskeletal:no cyanosis of digits and no clubbing  NEURO: alert & oriented x 3 with fluent speech, no focal motor/sensory deficits  LABORATORY DATA:  I have reviewed the data as listed    Component Value Date/Time   NA 141 12/08/2013 1611   NA 142 09/04/2013 1507   K 4.0 12/08/2013 1611   K 4.0 09/04/2013 1507   CL 102 12/08/2013 1611  CL 105 07/25/2012 1005   CO2 33* 12/08/2013 1611   CO2 27 09/04/2013 1507   GLUCOSE 92 12/08/2013 1611   GLUCOSE 131 09/04/2013 1507   GLUCOSE 142* 07/25/2012 1005   BUN 23 12/08/2013 1611   BUN 20.0 09/04/2013 1507   CREATININE 1.1 12/08/2013 1611   CREATININE 1.0 09/04/2013 1507   CREATININE 1.10  03/15/2009 1014   CALCIUM 9.1 12/08/2013 1611   CALCIUM 9.4 09/04/2013 1507   PROT 7.2 09/04/2013 1507   PROT 7.4 09/06/2012 1250   ALBUMIN 3.5 09/04/2013 1507   ALBUMIN 3.7 09/06/2012 1250   AST 18 09/04/2013 1507   AST 25 09/06/2012 1250   ALT 10 09/04/2013 1507   ALT 14 09/06/2012 1250   ALKPHOS 66 09/04/2013 1507   ALKPHOS 64 09/06/2012 1250   BILITOT 1.38* 09/04/2013 1507   BILITOT 1.2 09/06/2012 1250   GFRNONAA 34* 11/18/2013 0453   GFRAA 39* 11/18/2013 0453    No results found for this basename: SPEP,  UPEP,   kappa and lambda light chains    Lab Results  Component Value Date   WBC 5.7 07/27/2014   NEUTROABS 3.7 07/27/2014   HGB 9.8* 07/27/2014   HCT 30.3* 07/27/2014   MCV 93.8 07/27/2014   PLT 235 07/27/2014      Chemistry      Component Value Date/Time   NA 141 12/08/2013 1611   NA 142 09/04/2013 1507   K 4.0 12/08/2013 1611   K 4.0 09/04/2013 1507   CL 102 12/08/2013 1611   CL 105 07/25/2012 1005   CO2 33* 12/08/2013 1611   CO2 27 09/04/2013 1507   BUN 23 12/08/2013 1611   BUN 20.0 09/04/2013 1507   CREATININE 1.1 12/08/2013 1611   CREATININE 1.0 09/04/2013 1507   CREATININE 1.10 03/15/2009 1014      Component Value Date/Time   CALCIUM 9.1 12/08/2013 1611   CALCIUM 9.4 09/04/2013 1507   ALKPHOS 66 09/04/2013 1507   ALKPHOS 64 09/06/2012 1250   AST 18 09/04/2013 1507   AST 25 09/06/2012 1250   ALT 10 09/04/2013 1507   ALT 14 09/06/2012 1250   BILITOT 1.38* 09/04/2013 1507   BILITOT 1.2 09/06/2012 1250      ASSESSMENT & PLAN:  MDS (myelodysplastic syndrome) She has minimum response to Darbopoeitin 300 mcg. Since we increased to 500 mcg, she has skipped 2 doses. I will lengthen her appointment to every 4 weeks and will continue on darbepoetin and 500 mcg to keep hemoglobin greater than 10 g. At the end of the year, I would recheck serum vitamin B12 and iron studies. I will see her back in 6 with history, physical examination and blood work.  Essential  hypertension Her blood pressure appears stable while on erythropoietin stimulating agents.    Orders Placed This Encounter  Procedures  . Comprehensive metabolic panel    Standing Status: Future     Number of Occurrences:      Standing Expiration Date: 08/31/2015  . Vitamin B12    Standing Status: Future     Number of Occurrences:      Standing Expiration Date: 08/31/2015  . Iron and TIBC    Standing Status: Future     Number of Occurrences:      Standing Expiration Date: 08/31/2015  . Ferritin    Standing Status: Future     Number of Occurrences:      Standing Expiration Date: 08/31/2015   All questions were answered. The patient knows  to call the clinic with any problems, questions or concerns. No barriers to learning was detected. I spent 15 minutes counseling the patient face to face. The total time spent in the appointment was 20 minutes and more than 50% was on counseling and review of test results     [facility], [place], MD 07/27/2014 1:32 PM

## 2014-07-27 NOTE — Assessment & Plan Note (Signed)
Her blood pressure appears stable while on erythropoietin stimulating agents.

## 2014-07-27 NOTE — Telephone Encounter (Signed)
gv adn printed appt sched and avs for pt for Sept thru March 2016

## 2014-08-05 ENCOUNTER — Ambulatory Visit: Payer: Medicare Other | Admitting: Hematology and Oncology

## 2014-08-05 ENCOUNTER — Other Ambulatory Visit: Payer: Medicare Other

## 2014-08-05 ENCOUNTER — Ambulatory Visit: Payer: Medicare Other

## 2014-08-16 ENCOUNTER — Other Ambulatory Visit: Payer: Medicare Other

## 2014-08-16 ENCOUNTER — Ambulatory Visit: Payer: Medicare Other

## 2014-08-17 ENCOUNTER — Encounter: Payer: Self-pay | Admitting: Internal Medicine

## 2014-08-17 ENCOUNTER — Telehealth: Payer: Self-pay | Admitting: Internal Medicine

## 2014-08-17 ENCOUNTER — Ambulatory Visit (INDEPENDENT_AMBULATORY_CARE_PROVIDER_SITE_OTHER): Payer: Medicare Other | Admitting: Internal Medicine

## 2014-08-17 VITALS — BP 156/80 | HR 74 | Ht 66.0 in | Wt 139.4 lb

## 2014-08-17 DIAGNOSIS — I4891 Unspecified atrial fibrillation: Secondary | ICD-10-CM | POA: Diagnosis not present

## 2014-08-17 DIAGNOSIS — I10 Essential (primary) hypertension: Secondary | ICD-10-CM

## 2014-08-17 DIAGNOSIS — I482 Chronic atrial fibrillation, unspecified: Secondary | ICD-10-CM

## 2014-08-17 NOTE — Telephone Encounter (Signed)
Will place labs at visit in case other things need to be checked. As long as she keeps visit on the 28th the labs we need can be drawn on the 29th at the cancer center. If she needs refill of synthroid prior to that visit we can send. If not we will wait on refill until after labs resulted.  Thanks, Dr. Doug Sou

## 2014-08-17 NOTE — Telephone Encounter (Signed)
I called the patient's daughter and told her that Dr. Doug Sou put the orders in to have the labs drawn at the cancer center. She will keep her appt with [place] on the 28th.

## 2014-08-17 NOTE — Assessment & Plan Note (Signed)
She has chronic atrial fibrillation but her ventricular rate appears to be well-controlled. No change in medical therapy.

## 2014-08-17 NOTE — Assessment & Plan Note (Signed)
Her blood pressure is well controlled. She'll continue her current medications. 

## 2014-08-17 NOTE — Telephone Encounter (Signed)
Patient is almost out of synthroid.  She has a visit 9/28 to establish care with Dr. Doug Sou.  She is requesting a refill to be sent to [facility] on [address].  She will be having a blood draw on Tuesday the 29th at the cancer clinic.  She would like to know if all lab work could be sent there so she can only have one stick.

## 2014-08-17 NOTE — Patient Instructions (Signed)
Your physician wants you to follow-up in: 12 months with Dr. Taylor. You will receive a reminder letter in the mail two months in advance. If you don't receive a letter, please call our office to schedule the follow-up appointment.    

## 2014-08-17 NOTE — Progress Notes (Signed)
HPI [name] returns today for followup. She is a very pleasant 80 year old woman with a history of atrial fibrillation, aortic stenosis, chronic anemia, and hypertension. In the interim, she has done well. No chest pain or sob. No peripheral edema on Lasix.   [name] returns today for followup. Allergies  Allergen Reactions  . Levaquin [Levofloxacin] Anaphylaxis  . Penicillins Anaphylaxis  . Sulfonamide Derivatives Anaphylaxis     Current Outpatient Prescriptions  Medication Sig Dispense Refill  . aspirin 81 MG tablet Take 81 mg by mouth daily.      . Cholecalciferol (VITAMIN D3) 3000 UNITS TABS Take 1 capsule by mouth daily.      . furosemide (LASIX) 40 MG tablet Take 1 tablet (40 mg total) by mouth daily.  30 tablet  6  . levothyroxine (SYNTHROID, LEVOTHROID) 125 MCG tablet Take 125 mcg by mouth daily.      . metoprolol tartrate (LOPRESSOR) 25 MG tablet Take 0.5 tablets (12.5 mg total) by mouth 2 (two) times daily.  30 tablet  3  . NON FORMULARY Oxygen 2 Liters every night and as needed      . senna (SENOKOT) 8.6 MG TABS tablet Take 1 tablet by mouth every other day.      . vitamin B-12 (CYANOCOBALAMIN) 250 MCG tablet Take 250 mcg by mouth daily.       No current facility-administered medications for this visit.     Past Medical History  Diagnosis Date  . MDS (myelodysplastic syndrome) 10/05/2011  . Pneumonia   . Thyroid disease   . Hypertension   . Atrial fibrillation dx Mar. 2010  . RBBB   . Pulmonary hypertension   . Chronic systolic heart failure   . Hypothyroidism   . Aortic stenosis   . Acute respiratory failure with hypoxia   . Anemia, unspecified 07/27/2014    ROS:   All systems reviewed and negative except as noted in the HPI.   Past Surgical History  Procedure Laterality Date  . Orif femur fracture  2001    left  . Appendectomy    . Orif periprosthetic fracture Left 11/12/2013    Procedure: OPEN REDUCTION INTERNAL FIXATION (ORIF) LEFT  HIP PERIPROSTHETIC FRACTURE,;  Surgeon: Marianna Payment, MD;  Location: [facility];  Service: Orthopedics;  Laterality: Left;     No family history on file.   History   Social History  . Marital Status: Widowed    Spouse Name: N/A    Number of Children: N/A  . Years of Education: N/A   Occupational History  . Not on file.   Social History Main Topics  . Smoking status: Never Smoker   . Smokeless tobacco: Never Used  . Alcohol Use: No  . Drug Use: No  . Sexual Activity: Not on file   Other Topics Concern  . Not on file   Social History Narrative  . No narrative on file     Ht 5\' 6"  (1.676 m)  Wt 139 lb 6.4 oz (63.231 kg)  BMI 22.51 kg/m2  Physical Exam:  Well appearing 80 year old woman, NAD HEENT: Unremarkable Neck:  No JVD, no thyromegall Back:  No CVA tenderness Lungs:  Clear with no wheezes, rales, or rhonchi. HEART:  IRegular rate rhythm, 2/6 systolic murmurs, no rubs, no clicks Abd:  soft, positive bowel sounds, no organomegally, no rebound, no guarding Ext:  2 plus pulses, no edema, no cyanosis, no clubbing Skin:  No rashes no nodules Neuro:  CN II through XII intact, motor grossly intact  EKG - atrial fibrillation with right bundle branch block, and evidence of right ventricular hypertrophy   Assess/Plan:

## 2014-08-17 NOTE — Telephone Encounter (Signed)
[name] daughter wants to know if you mind placing an order for lab work to check the patient's thyroid. She is having blood work at the [facility] and is trying to avoid multiple sticks due to bruising and she is also a very hard stick. She will need a refill on her thyroid medication soon and wants to know if the mcg's need to change before the next refill. Please advise, thanks.

## 2014-08-20 ENCOUNTER — Other Ambulatory Visit: Payer: Self-pay | Admitting: Internal Medicine

## 2014-08-20 ENCOUNTER — Ambulatory Visit: Payer: Medicare Other

## 2014-08-23 ENCOUNTER — Other Ambulatory Visit: Payer: Medicare Other

## 2014-08-23 ENCOUNTER — Ambulatory Visit: Payer: Medicare Other

## 2014-08-23 ENCOUNTER — Encounter: Payer: Self-pay | Admitting: Internal Medicine

## 2014-08-23 ENCOUNTER — Ambulatory Visit: Payer: Medicare Other | Admitting: Hematology and Oncology

## 2014-08-23 ENCOUNTER — Ambulatory Visit (INDEPENDENT_AMBULATORY_CARE_PROVIDER_SITE_OTHER): Payer: Medicare Other | Admitting: Internal Medicine

## 2014-08-23 ENCOUNTER — Other Ambulatory Visit: Payer: Self-pay | Admitting: Hematology and Oncology

## 2014-08-23 VITALS — BP 112/78 | HR 90 | Temp 97.9°F | Resp 16 | Ht 66.0 in | Wt 140.4 lb

## 2014-08-23 DIAGNOSIS — D469 Myelodysplastic syndrome, unspecified: Secondary | ICD-10-CM

## 2014-08-23 DIAGNOSIS — I482 Chronic atrial fibrillation, unspecified: Secondary | ICD-10-CM

## 2014-08-23 DIAGNOSIS — D649 Anemia, unspecified: Secondary | ICD-10-CM

## 2014-08-23 DIAGNOSIS — E039 Hypothyroidism, unspecified: Secondary | ICD-10-CM | POA: Diagnosis not present

## 2014-08-23 DIAGNOSIS — I4891 Unspecified atrial fibrillation: Secondary | ICD-10-CM

## 2014-08-23 DIAGNOSIS — I10 Essential (primary) hypertension: Secondary | ICD-10-CM

## 2014-08-23 DIAGNOSIS — I5032 Chronic diastolic (congestive) heart failure: Secondary | ICD-10-CM

## 2014-08-23 NOTE — Assessment & Plan Note (Signed)
She continues to follow with hematology and is stable at this time. They are working on increasing her doses. She takes B12 and iron daily.

## 2014-08-23 NOTE — Progress Notes (Signed)
   Subjective:    Patient ID: Gina Wilkinson, female    DOB: 1920/09/19, 80 y.o.   MRN: [id number]  HPI The patient is a 80 YO female who is coming in today to establish care. She has PMH of atrial fibrillation, mild diastolic CHF, hx femur fracture last year (now ambulating well with walker), hypothyroidism, MDS, pulm HTN (secondary to heart problems with aortic stenosis). She is doing well and denies any complaints. Her 2 daughters are with her today and help to provide some history. She does have some mild cognitive impairment per the daughters and is able to carry on a conversation well. She denies chest pain, SOB, abdominal pain, constipation, diarrhea. She denies leg swelling or syncope.   Review of Systems  Constitutional: Negative for diaphoresis, activity change, appetite change and fatigue.  HENT: Negative.   Eyes: Negative.   Respiratory: Negative for cough, chest tightness, shortness of breath and wheezing.   Cardiovascular: Negative for chest pain, palpitations and leg swelling.  Gastrointestinal: Negative for abdominal pain, diarrhea, constipation and abdominal distention.  Musculoskeletal: Positive for gait problem.       Uses walker well.  Neurological: Negative.       Objective:   Physical Exam  Constitutional: She appears well-developed and well-nourished. No distress.  HENT:  Head: Normocephalic and atraumatic.  Eyes: EOM are normal.  Neck: Normal range of motion.  Cardiovascular: Normal rate.   Murmur heard. Systolic murmur loud  Pulmonary/Chest: Effort normal. No respiratory distress. She has no wheezes. She has no rales.  Abdominal: Soft. Bowel sounds are normal. She exhibits no distension. There is no tenderness.  Neurological: She is alert.  Uses walker and gets around well.  Skin: Skin is warm and dry.   Filed Vitals:   08/23/14 1401  BP: 112/78  Pulse: 90  Temp: 97.9 F (36.6 C)  TempSrc: Oral  Resp: 16  Height: 5\' 6"  (1.676 m)  Weight: 140 lb 6.4  oz (63.685 kg)  SpO2: 90%      Assessment & Plan:  Declined all shots today including flu, pneumonia, shingles.

## 2014-08-23 NOTE — Assessment & Plan Note (Signed)
She continues with oxygen at night time and does well overall. No limitations to her stamina.

## 2014-08-23 NOTE — Assessment & Plan Note (Signed)
BP well controlled with lasix daily and metoprolol BID.

## 2014-08-23 NOTE — Assessment & Plan Note (Signed)
She does take aspirin 81 mg daily and metoprolol 12.5 mg BID.

## 2014-08-23 NOTE — Patient Instructions (Signed)
We will see you back in about 1 year for a check up. If you have any problems before then please feel free to call our office.   We will send in the blood tests to Dr. Alvy Bimler and have them draw the thyroid tests there tomorrow.   We are not going to change any of your medicines unless the thyroid levels need to be changed.

## 2014-08-23 NOTE — Progress Notes (Signed)
Pre visit review using our clinic review tool, if applicable. No additional management support is needed unless otherwise documented below in the visit note. 

## 2014-08-23 NOTE — Assessment & Plan Note (Signed)
Part of her MDS and monitored with hematology and gets aranesp as needed and transfusion as needed.

## 2014-08-24 ENCOUNTER — Other Ambulatory Visit (HOSPITAL_BASED_OUTPATIENT_CLINIC_OR_DEPARTMENT_OTHER): Payer: Medicare Other

## 2014-08-24 ENCOUNTER — Ambulatory Visit (HOSPITAL_BASED_OUTPATIENT_CLINIC_OR_DEPARTMENT_OTHER): Payer: Medicare Other

## 2014-08-24 VITALS — BP 142/68 | HR 65 | Temp 98.2°F

## 2014-08-24 DIAGNOSIS — I10 Essential (primary) hypertension: Secondary | ICD-10-CM | POA: Diagnosis not present

## 2014-08-24 DIAGNOSIS — E039 Hypothyroidism, unspecified: Secondary | ICD-10-CM

## 2014-08-24 DIAGNOSIS — D469 Myelodysplastic syndrome, unspecified: Secondary | ICD-10-CM

## 2014-08-24 LAB — CBC & DIFF AND RETIC
BASO%: 0.6 % (ref 0.0–2.0)
Basophils Absolute: 0 10*3/uL (ref 0.0–0.1)
EOS ABS: 0.3 10*3/uL (ref 0.0–0.5)
EOS%: 4.8 % (ref 0.0–7.0)
HCT: 30.2 % — ABNORMAL LOW (ref 34.8–46.6)
HGB: 9.7 g/dL — ABNORMAL LOW (ref 11.6–15.9)
IMMATURE RETIC FRACT: 3.2 % (ref 1.60–10.00)
LYMPH%: 16.3 % (ref 14.0–49.7)
MCH: 30.4 pg (ref 25.1–34.0)
MCHC: 32.1 g/dL (ref 31.5–36.0)
MCV: 94.7 fL (ref 79.5–101.0)
MONO#: 0.5 10*3/uL (ref 0.1–0.9)
MONO%: 10 % (ref 0.0–14.0)
NEUT%: 68.3 % (ref 38.4–76.8)
NEUTROS ABS: 3.6 10*3/uL (ref 1.5–6.5)
Platelets: 204 10*3/uL (ref 145–400)
RBC: 3.19 10*6/uL — ABNORMAL LOW (ref 3.70–5.45)
RDW: 16.5 % — ABNORMAL HIGH (ref 11.2–14.5)
Retic %: 1.2 % (ref 0.70–2.10)
Retic Ct Abs: 38.28 10*3/uL (ref 33.70–90.70)
WBC: 5.2 10*3/uL (ref 3.9–10.3)
lymph#: 0.9 10*3/uL (ref 0.9–3.3)

## 2014-08-24 LAB — T4, FREE: Free T4: 1.45 ng/dL (ref 0.80–1.80)

## 2014-08-24 MED ORDER — DARBEPOETIN ALFA-POLYSORBATE 300 MCG/0.6ML IJ SOLN
500.0000 ug | Freq: Once | INTRAMUSCULAR | Status: AC
  Administered 2014-08-24: 500 ug via SUBCUTANEOUS
  Filled 2014-08-24: qty 1.2

## 2014-08-25 ENCOUNTER — Telehealth: Payer: Self-pay | Admitting: *Deleted

## 2014-08-25 LAB — TSH CHCC: TSH: 0.038 m[IU]/L — AB (ref 0.308–3.960)

## 2014-08-25 NOTE — Telephone Encounter (Signed)
Message copied by Cathlean Cower on Wed Aug 25, 2014  9:56 AM ------      Message from: [facility], [place]: Wed Aug 25, 2014  9:23 AM      Regarding: thyroid test result       PLs call Dr. Meredith Mody office, her internist regarding abnormal thyroid function. She requested the thyroid test      ----- Message -----         From: [facility]         Sent: 08/24/2014   3:39 PM           To: Heath Lark, MD                   ------

## 2014-08-25 NOTE — Telephone Encounter (Signed)
Notified Jasmine w/ Dr. Yisroel Ramming of abnormal TSH.  She will send message to Dr. Yisroel Ramming.

## 2014-08-26 ENCOUNTER — Other Ambulatory Visit: Payer: Self-pay | Admitting: Internal Medicine

## 2014-08-26 DIAGNOSIS — E039 Hypothyroidism, unspecified: Secondary | ICD-10-CM

## 2014-08-26 MED ORDER — LEVOTHYROXINE SODIUM 112 MCG PO TABS: 112.0000 ug | ORAL_TABLET | Freq: Every day | ORAL | Status: DC

## 2014-08-26 NOTE — Progress Notes (Signed)
Left message for patient to call me back. 

## 2014-08-27 NOTE — Progress Notes (Signed)
Left message on machine informing patient to go pick up her new medication.

## 2014-08-30 ENCOUNTER — Telehealth: Payer: Self-pay | Admitting: Internal Medicine

## 2014-08-30 NOTE — Telephone Encounter (Signed)
I tried to reach Jessy Oto at home and on cell. I left a message for her to call me back.

## 2014-08-30 NOTE — Telephone Encounter (Signed)
[name] would like a call back in regards to meds that was called in for her mother last week.

## 2014-09-01 NOTE — Telephone Encounter (Signed)
Message copied by Alger Memos on Wed Sep 01, 2014 10:06 AM ------      Message from: Earnstine Regal      Created: Tue Aug 31, 2014  9:27 AM       Pt daughter "[name]" is returning your call      Call back [phone] ------

## 2014-09-03 ENCOUNTER — Other Ambulatory Visit: Payer: Medicare Other

## 2014-09-03 ENCOUNTER — Ambulatory Visit: Payer: Medicare Other

## 2014-09-09 ENCOUNTER — Ambulatory Visit: Payer: Medicare Other | Admitting: Internal Medicine

## 2014-09-17 ENCOUNTER — Ambulatory Visit: Payer: Medicare Other

## 2014-09-17 ENCOUNTER — Telehealth: Payer: Self-pay | Admitting: Internal Medicine

## 2014-09-17 NOTE — Telephone Encounter (Signed)
She already has that ordered to be done at our clinic. I cannot send labs to the cancer center can only ask her doctor there for it to be done. It will be much easier for me to see results if I place the order. Can she do her blood work here instead?

## 2014-09-17 NOTE — Telephone Encounter (Signed)
Patient is going Tuesday 10/27 to the cancer center.  She will have blood work there.  Daughter is requesting an order to be sent over to check tyroid to make sure new dosage is working.

## 2014-09-21 ENCOUNTER — Ambulatory Visit: Payer: Medicare Other

## 2014-09-21 ENCOUNTER — Other Ambulatory Visit (HOSPITAL_BASED_OUTPATIENT_CLINIC_OR_DEPARTMENT_OTHER): Payer: Medicare Other

## 2014-09-21 DIAGNOSIS — D469 Myelodysplastic syndrome, unspecified: Secondary | ICD-10-CM

## 2014-09-21 LAB — CBC & DIFF AND RETIC
BASO%: 0.9 % (ref 0.0–2.0)
Basophils Absolute: 0.1 10*3/uL (ref 0.0–0.1)
EOS%: 5.4 % (ref 0.0–7.0)
Eosinophils Absolute: 0.4 10*3/uL (ref 0.0–0.5)
HEMATOCRIT: 36.3 % (ref 34.8–46.6)
HGB: 11.8 g/dL (ref 11.6–15.9)
IMMATURE RETIC FRACT: 1.9 % (ref 1.60–10.00)
LYMPH#: 1.3 10*3/uL (ref 0.9–3.3)
LYMPH%: 19.5 % (ref 14.0–49.7)
MCH: 30.4 pg (ref 25.1–34.0)
MCHC: 32.5 g/dL (ref 31.5–36.0)
MCV: 93.6 fL (ref 79.5–101.0)
MONO#: 0.5 10*3/uL (ref 0.1–0.9)
MONO%: 7.4 % (ref 0.0–14.0)
NEUT%: 66.8 % (ref 38.4–76.8)
NEUTROS ABS: 4.3 10*3/uL (ref 1.5–6.5)
Platelets: 244 10*3/uL (ref 145–400)
RBC: 3.88 10*6/uL (ref 3.70–5.45)
RDW: 16.3 % — ABNORMAL HIGH (ref 11.2–14.5)
RETIC %: 1.08 % (ref 0.70–2.10)
Retic Ct Abs: 41.9 10*3/uL (ref 33.70–90.70)
WBC: 6.5 10*3/uL (ref 3.9–10.3)

## 2014-09-27 ENCOUNTER — Other Ambulatory Visit: Payer: Self-pay | Admitting: Nurse Practitioner

## 2014-10-01 ENCOUNTER — Other Ambulatory Visit: Payer: Medicare Other

## 2014-10-01 ENCOUNTER — Ambulatory Visit: Payer: Medicare Other

## 2014-10-05 ENCOUNTER — Other Ambulatory Visit: Payer: Self-pay | Admitting: Internal Medicine

## 2014-10-10 ENCOUNTER — Other Ambulatory Visit: Payer: Self-pay | Admitting: Internal Medicine

## 2014-10-11 IMAGING — CT CT HEAD W/O CM
2 series · 16 of 30 positions shown, 19 images · non-contrast
Comparison: No priors.

CLINICAL DATA: Altered mental status.  Possible head trauma.

CT HEAD WITHOUT CONTRAST
TECHNIQUE: Contiguous axial images were obtained from the base of
the skull through the vertex without contrast.

[Series 2: head w/o · axial · non-contrast · 0.48mm/px · z∈[+1044,+1164]mm · 9 of 30 slices shown, 12 images]
[im 3/30  brain]
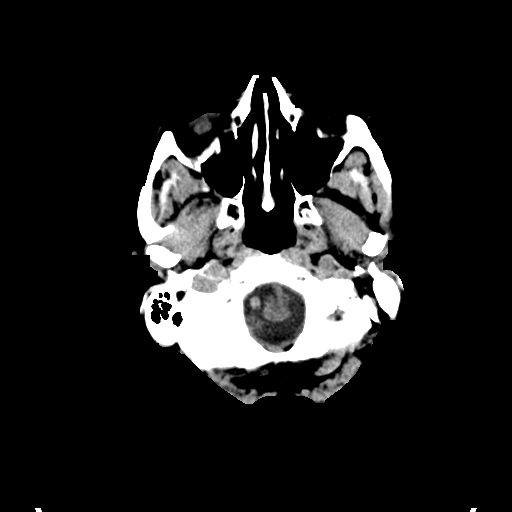
[im 3/30  bone]
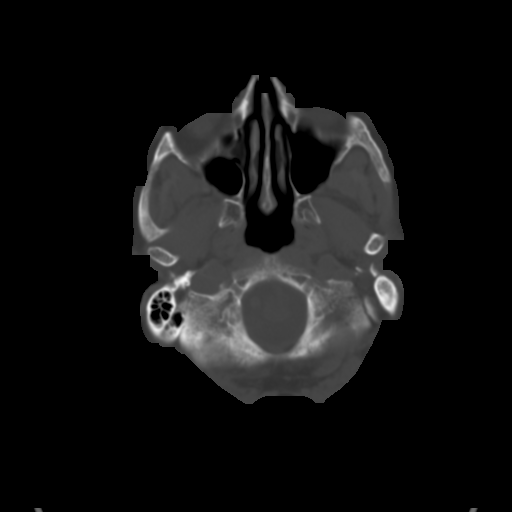
[im 6/30  brain]
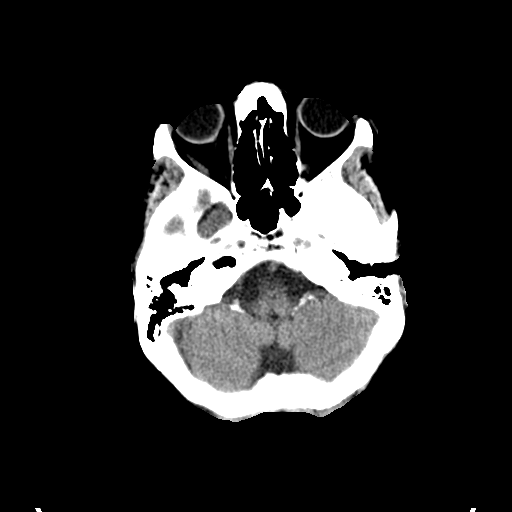
[im 9/30  brain]
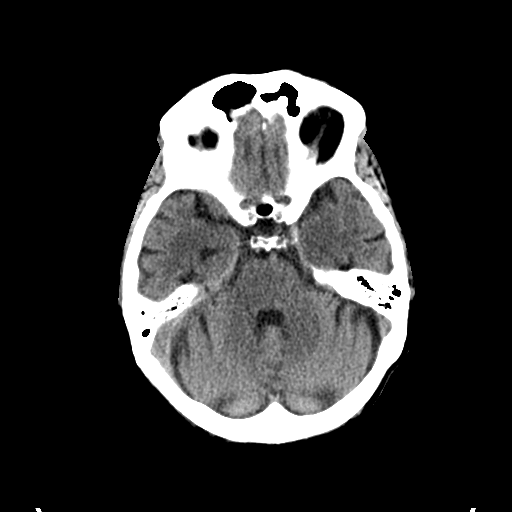
[im 12/30  brain]
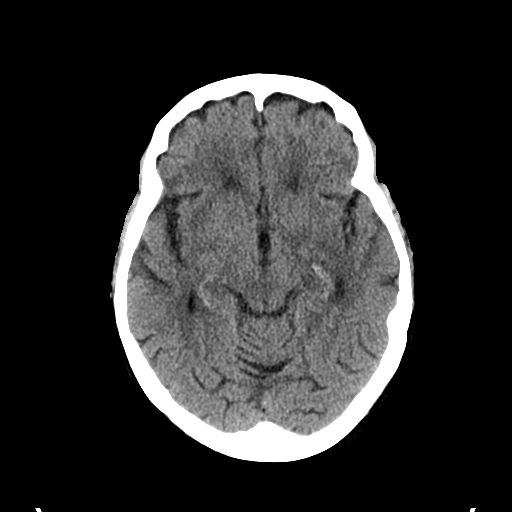
[im 15/30  brain]
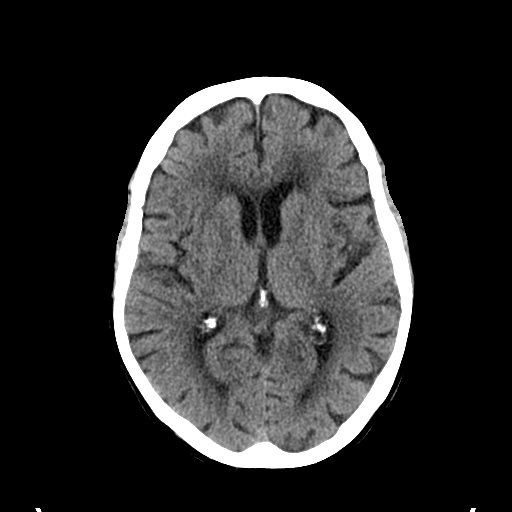
[im 15/30  bone]
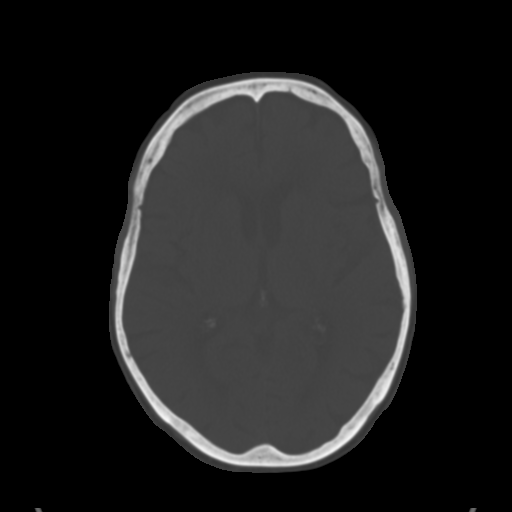
[im 18/30  brain]
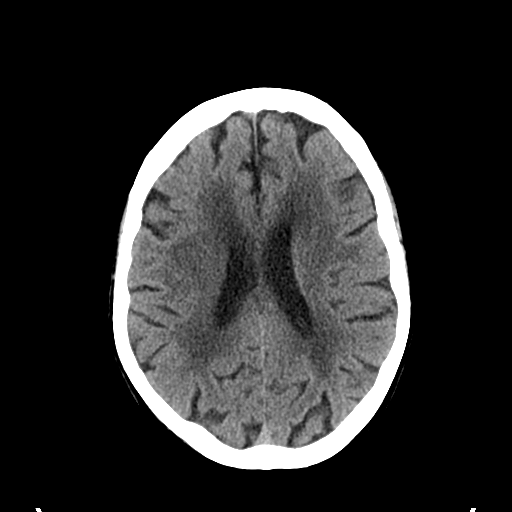
[im 21/30  brain]
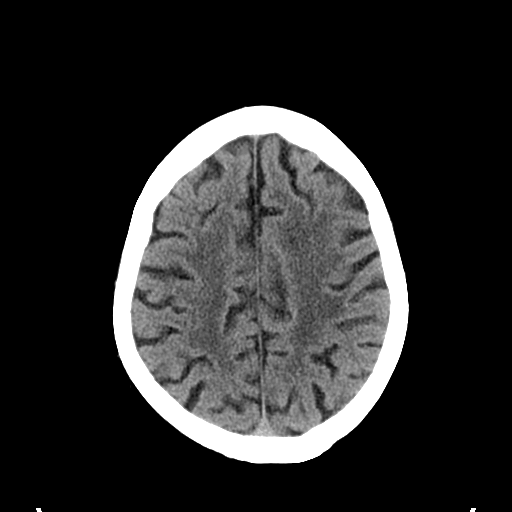
[im 24/30  brain]
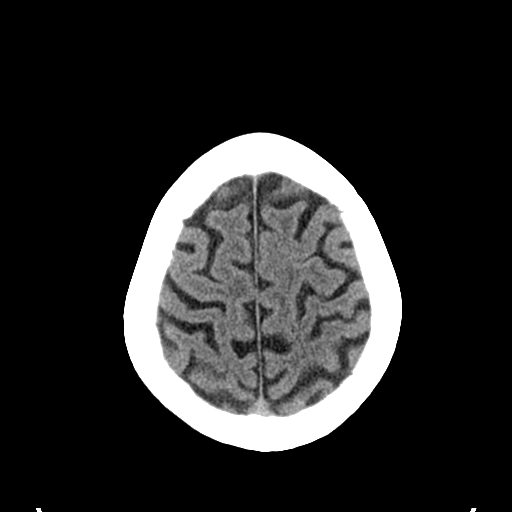
[im 27/30  brain]
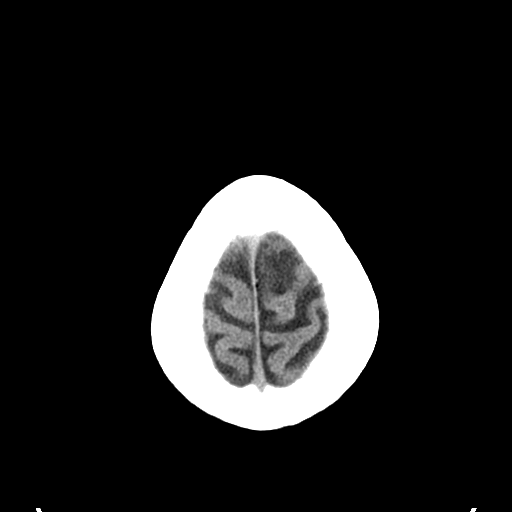
[im 27/30  bone]
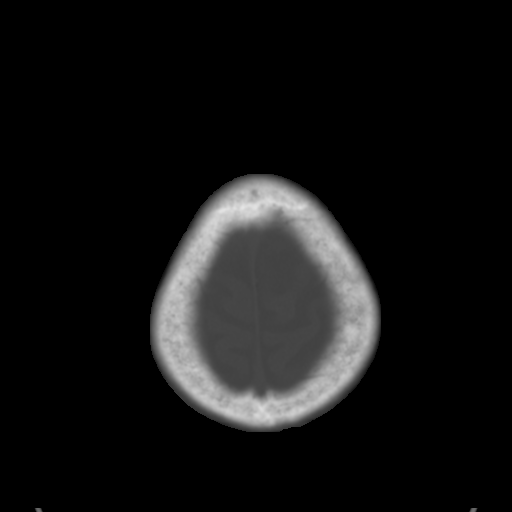

[Series 3: bone windows · axial · 0.48mm/px · z∈[+1049,+1145]mm · 7 of 49 slices shown]
[im 6/49  bone]
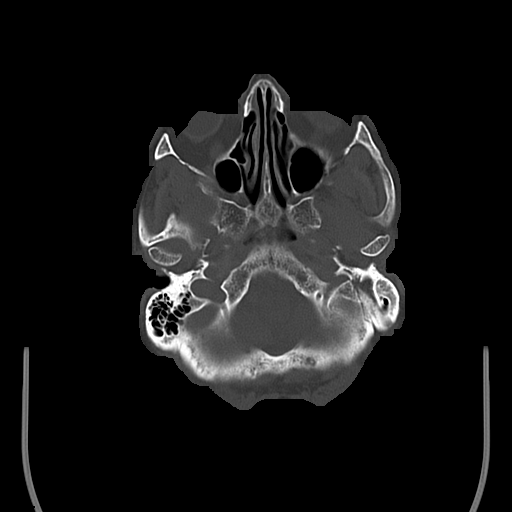
[im 11/49  bone]
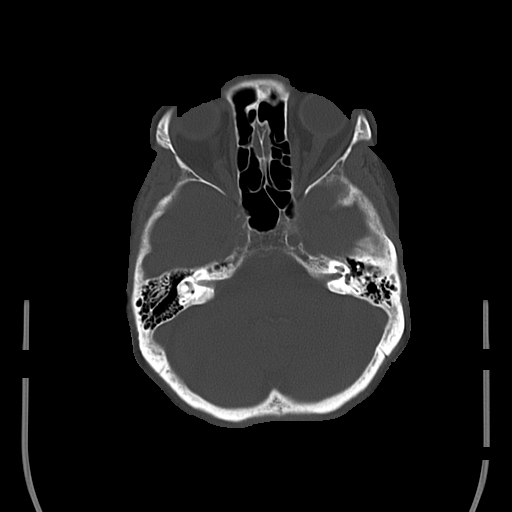
[im 17/49  bone]
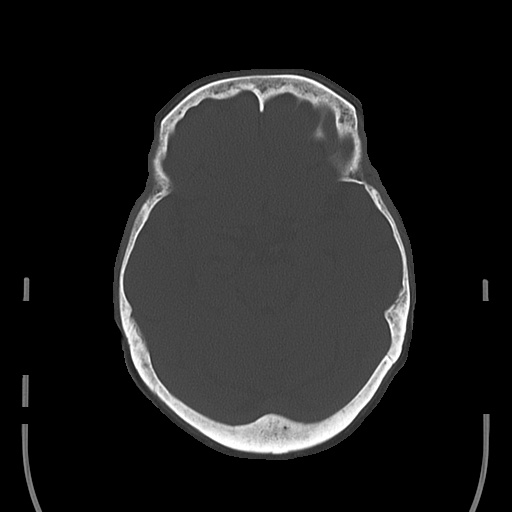
[im 22/49  bone]
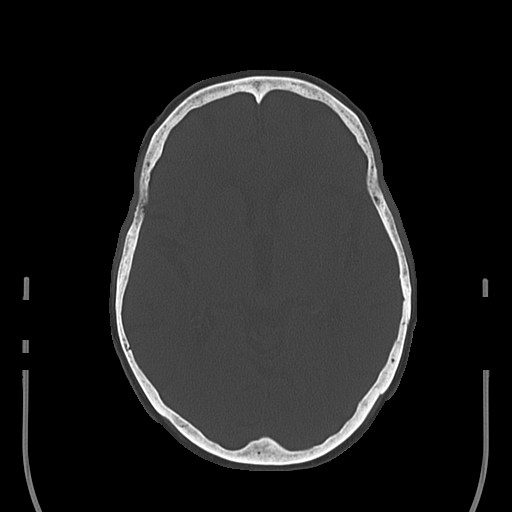
[im 27/49  bone]
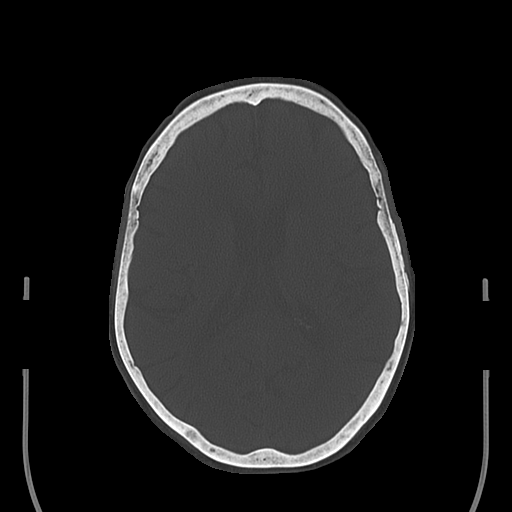
[im 33/49  bone]
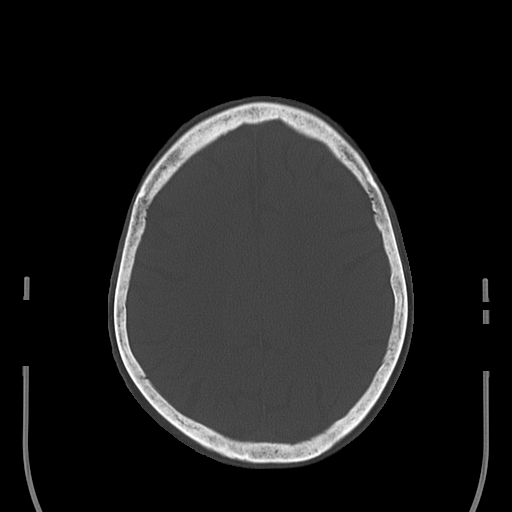
[im 38/49  bone]
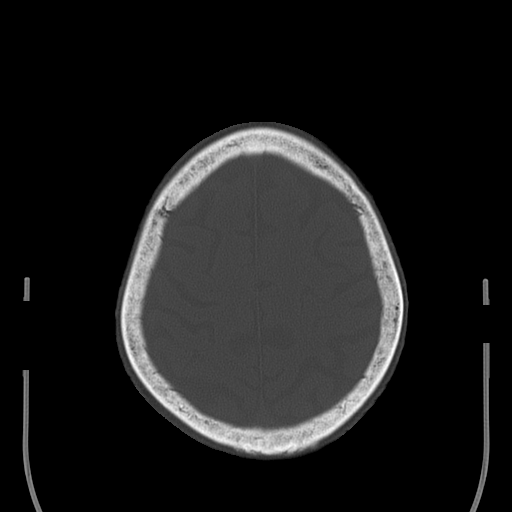

[16 of 30 positions shown; findings below may reference images not displayed]

FINDINGS: Mild cerebral and cerebellar atrophy is age appropriate.
There are extensive patchy and confluent areas of decreased
attenuation throughout the deep and periventricular white matter of
the cerebral hemispheres bilaterally, compatible with chronic
microvascular ischemic changes. No acute displaced skull fractures
are identified.  No acute intracranial abnormality.  Specifically,
no evidence of acute post-traumatic intracranial hemorrhage, no
definite regions of acute/subacute cerebral ischemia, no focal
mass, mass effect, hydrocephalus or abnormal intra or extra-axial
fluid collections.  The visualized paranasal sinuses and mastoids
are well pneumatized.
IMPRESSION: 1.  No acute displaced skull fractures or acute intracranial
abnormalities.
2.  Cerebral and cerebellar atrophy with extensive chronic
microvascular ischemic changes in the cerebral white matter, as
above.

## 2014-10-18 ENCOUNTER — Telehealth: Payer: Self-pay | Admitting: *Deleted

## 2014-10-18 ENCOUNTER — Telehealth: Payer: Self-pay | Admitting: Internal Medicine

## 2014-10-18 DIAGNOSIS — E038 Other specified hypothyroidism: Secondary | ICD-10-CM

## 2014-10-18 NOTE — Telephone Encounter (Signed)
Daughter states Dr. Doug Sou wants pt to have TSH and Free T4 w/ her labs tomorrow.

## 2014-10-18 NOTE — Telephone Encounter (Signed)
Informed pt's son in law of labs ordered for tomorrow.  He verbalized understanding.

## 2014-10-18 NOTE — Telephone Encounter (Signed)
Patient's daughter, [name], called stating the patient is going to the cancer center tomorrow for labs and would like Dr. Doug Sou to put orders in for TSH so they can recheck her level while she is there. CB# [phone]

## 2014-10-18 NOTE — Telephone Encounter (Signed)
Yes please

## 2014-10-18 NOTE — Telephone Encounter (Signed)
Spoke to Burr and informed her that the lab orders are in, but she needs to come here to get the labs drawn.

## 2014-10-19 ENCOUNTER — Other Ambulatory Visit (HOSPITAL_BASED_OUTPATIENT_CLINIC_OR_DEPARTMENT_OTHER): Payer: Medicare Other

## 2014-10-19 ENCOUNTER — Ambulatory Visit (HOSPITAL_BASED_OUTPATIENT_CLINIC_OR_DEPARTMENT_OTHER): Payer: Medicare Other

## 2014-10-19 DIAGNOSIS — D649 Anemia, unspecified: Secondary | ICD-10-CM

## 2014-10-19 DIAGNOSIS — E038 Other specified hypothyroidism: Secondary | ICD-10-CM | POA: Diagnosis not present

## 2014-10-19 DIAGNOSIS — D469 Myelodysplastic syndrome, unspecified: Secondary | ICD-10-CM

## 2014-10-19 DIAGNOSIS — E018 Other iodine-deficiency related thyroid disorders and allied conditions: Secondary | ICD-10-CM | POA: Diagnosis not present

## 2014-10-19 LAB — CBC & DIFF AND RETIC
BASO%: 0.3 % (ref 0.0–2.0)
Basophils Absolute: 0 10*3/uL (ref 0.0–0.1)
EOS%: 5.6 % (ref 0.0–7.0)
Eosinophils Absolute: 0.3 10*3/uL (ref 0.0–0.5)
HEMATOCRIT: 31.2 % — AB (ref 34.8–46.6)
HGB: 10 g/dL — ABNORMAL LOW (ref 11.6–15.9)
Immature Retic Fract: 4.8 % (ref 1.60–10.00)
LYMPH#: 1.1 10*3/uL (ref 0.9–3.3)
LYMPH%: 18.5 % (ref 14.0–49.7)
MCH: 29.9 pg (ref 25.1–34.0)
MCHC: 32.1 g/dL (ref 31.5–36.0)
MCV: 93.4 fL (ref 79.5–101.0)
MONO#: 0.7 10*3/uL (ref 0.1–0.9)
MONO%: 11 % (ref 0.0–14.0)
NEUT#: 4 10*3/uL (ref 1.5–6.5)
NEUT%: 64.6 % (ref 38.4–76.8)
PLATELETS: 267 10*3/uL (ref 145–400)
RBC: 3.34 10*6/uL — ABNORMAL LOW (ref 3.70–5.45)
RDW: 16.9 % — ABNORMAL HIGH (ref 11.2–14.5)
Retic %: 1.83 % (ref 0.70–2.10)
Retic Ct Abs: 61.12 10*3/uL (ref 33.70–90.70)
WBC: 6.1 10*3/uL (ref 3.9–10.3)
nRBC: 0 % (ref 0–0)

## 2014-10-19 LAB — T4, FREE: Free T4: 1.53 ng/dL (ref 0.80–1.80)

## 2014-10-19 MED ORDER — DARBEPOETIN ALFA-POLYSORBATE 300 MCG/0.6ML IJ SOLN
500.0000 ug | Freq: Once | INTRAMUSCULAR | Status: DC
  Administered 2014-10-19: 500 ug via SUBCUTANEOUS

## 2014-10-19 NOTE — Patient Instructions (Signed)
Darbepoetin Alfa injection What is this medicine? DARBEPOETIN ALFA (dar be POE e tin AL fa) helps your body make more red blood cells. It is used to treat anemia caused by chronic kidney failure and chemotherapy. This medicine may be used for other purposes; ask your health care provider or pharmacist if you have questions. COMMON BRAND NAME(S): Aranesp What should I tell my health care provider before I take this medicine? They need to know if you have any of these conditions: -blood clotting disorders or history of blood clots -cancer patient not on chemotherapy -cystic fibrosis -heart disease, such as angina, heart failure, or a history of a heart attack -hemoglobin level of 12 g/dL or greater -high blood pressure -low levels of folate, iron, or vitamin B12 -seizures -an unusual or allergic reaction to darbepoetin, erythropoietin, albumin, hamster proteins, latex, other medicines, foods, dyes, or preservatives -pregnant or trying to get pregnant -breast-feeding How should I use this medicine? This medicine is for injection into a vein or under the skin. It is usually given by a health care professional in a hospital or clinic setting. If you get this medicine at home, you will be taught how to prepare and give this medicine. Do not shake the solution before you withdraw a dose. Use exactly as directed. Take your medicine at regular intervals. Do not take your medicine more often than directed. It is important that you put your used needles and syringes in a special sharps container. Do not put them in a trash can. If you do not have a sharps container, call your pharmacist or healthcare provider to get one. Talk to your pediatrician regarding the use of this medicine in children. While this medicine may be used in children as young as 1 year for selected conditions, precautions do apply. Overdosage: If you think you have taken too much of this medicine contact a poison control center or  emergency room at once. NOTE: This medicine is only for you. Do not share this medicine with others. What if I miss a dose? If you miss a dose, take it as soon as you can. If it is almost time for your next dose, take only that dose. Do not take double or extra doses. What may interact with this medicine? Do not take this medicine with any of the following medications: -epoetin alfa This list may not describe all possible interactions. Give your health care provider a list of all the medicines, herbs, non-prescription drugs, or dietary supplements you use. Also tell them if you smoke, drink alcohol, or use illegal drugs. Some items may interact with your medicine. What should I watch for while using this medicine? Visit your prescriber or health care professional for regular checks on your progress and for the needed blood tests and blood pressure measurements. It is especially important for the doctor to make sure your hemoglobin level is in the desired range, to limit the risk of potential side effects and to give you the best benefit. Keep all appointments for any recommended tests. Check your blood pressure as directed. Ask your doctor what your blood pressure should be and when you should contact him or her. As your body makes more red blood cells, you may need to take iron, folic acid, or vitamin B supplements. Ask your doctor or health care provider which products are right for you. If you have kidney disease continue dietary restrictions, even though this medication can make you feel better. Talk with your doctor or health   care professional about the foods you eat and the vitamins that you take. What side effects may I notice from receiving this medicine? Side effects that you should report to your doctor or health care professional as soon as possible: -allergic reactions like skin rash, itching or hives, swelling of the face, lips, or tongue -breathing problems -changes in vision -chest  pain -confusion, trouble speaking or understanding -feeling faint or lightheaded, falls -high blood pressure -muscle aches or pains -pain, swelling, warmth in the leg -rapid weight gain -severe headaches -sudden numbness or weakness of the face, arm or leg -trouble walking, dizziness, loss of balance or coordination -seizures (convulsions) -swelling of the ankles, feet, hands -unusually weak or tired Side effects that usually do not require medical attention (report to your doctor or health care professional if they continue or are bothersome): -diarrhea -fever, chills (flu-like symptoms) -headaches -nausea, vomiting -redness, stinging, or swelling at site where injected This list may not describe all possible side effects. Call your doctor for medical advice about side effects. You may report side effects to FDA at 1-800-FDA-1088. Where should I keep my medicine? Keep out of the reach of children. Store in a refrigerator between 2 and 8 degrees C (36 and 46 degrees F). Do not freeze. Do not shake. Throw away any unused portion if using a single-dose vial. Throw away any unused medicine after the expiration date. NOTE: This sheet is a summary. It may not cover all possible information. If you have questions about this medicine, talk to your doctor, pharmacist, or health care provider.  2015, Elsevier/Gold Standard. (2008-10-26 10:23:57)  

## 2014-10-20 ENCOUNTER — Telehealth: Payer: Self-pay | Admitting: *Deleted

## 2014-10-20 LAB — TSH CHCC: TSH: 0.046 m[IU]/L — AB (ref 0.308–3.960)

## 2014-10-20 NOTE — Telephone Encounter (Signed)
Lab results routed to Dr. Doug Sou.

## 2014-10-20 NOTE — Telephone Encounter (Signed)
-----   Message from Heath Lark, MD sent at 10/20/2014  9:53 AM EST ----- Regarding: thyroid function I think his other doctor is requesting these results ----- Message -----    From: [facility]    Sent: 10/19/2014   3:31 PM      To: Heath Lark, MD

## 2014-10-22 ENCOUNTER — Ambulatory Visit: Payer: Medicare Other

## 2014-10-26 ENCOUNTER — Telehealth: Payer: Self-pay | Admitting: *Deleted

## 2014-10-26 NOTE — Telephone Encounter (Signed)
Left msg on triage stating she received TSH results off of mychart. Wanting to know is their anything changes to mom thyroid medication...Chryl Heck

## 2014-10-26 NOTE — Telephone Encounter (Signed)
I have reviewed her mother's laboratory results. At this time though will be no change to her thyroid dosing.  We will recheck those levels in about 3-4 months

## 2014-10-26 NOTE — Telephone Encounter (Signed)
Called pt daughter no answer LMOM with md response...Gina Wilkinson

## 2014-11-08 ENCOUNTER — Other Ambulatory Visit: Payer: Self-pay | Admitting: Internal Medicine

## 2014-11-16 ENCOUNTER — Other Ambulatory Visit: Payer: Medicare Other

## 2014-11-16 ENCOUNTER — Ambulatory Visit: Payer: Medicare Other

## 2014-12-02 DIAGNOSIS — M1612 Unilateral primary osteoarthritis, left hip: Secondary | ICD-10-CM | POA: Diagnosis not present

## 2014-12-14 ENCOUNTER — Ambulatory Visit: Payer: Medicare Other

## 2014-12-14 ENCOUNTER — Other Ambulatory Visit: Payer: Self-pay | Admitting: Hematology and Oncology

## 2014-12-14 ENCOUNTER — Other Ambulatory Visit (HOSPITAL_BASED_OUTPATIENT_CLINIC_OR_DEPARTMENT_OTHER): Payer: Medicare Other

## 2014-12-14 DIAGNOSIS — D469 Myelodysplastic syndrome, unspecified: Secondary | ICD-10-CM

## 2014-12-14 DIAGNOSIS — D539 Nutritional anemia, unspecified: Secondary | ICD-10-CM

## 2014-12-14 DIAGNOSIS — D649 Anemia, unspecified: Secondary | ICD-10-CM

## 2014-12-14 LAB — IRON AND TIBC CHCC
%SAT: 20 % — ABNORMAL LOW (ref 21–57)
Iron: 53 ug/dL (ref 41–142)
TIBC: 261 ug/dL (ref 236–444)
UIBC: 209 ug/dL (ref 120–384)

## 2014-12-14 LAB — FERRITIN CHCC: Ferritin: 992 ng/ml — ABNORMAL HIGH (ref 9–269)

## 2014-12-14 LAB — CBC WITH DIFFERENTIAL/PLATELET
BASO%: 1.4 % (ref 0.0–2.0)
Basophils Absolute: 0.1 10*3/uL (ref 0.0–0.1)
EOS%: 5.1 % (ref 0.0–7.0)
Eosinophils Absolute: 0.3 10*3/uL (ref 0.0–0.5)
HCT: 32.3 % — ABNORMAL LOW (ref 34.8–46.6)
HGB: 10.3 g/dL — ABNORMAL LOW (ref 11.6–15.9)
LYMPH%: 18.7 % (ref 14.0–49.7)
MCH: 29.7 pg (ref 25.1–34.0)
MCHC: 31.8 g/dL (ref 31.5–36.0)
MCV: 93.5 fL (ref 79.5–101.0)
MONO#: 0.5 10*3/uL (ref 0.1–0.9)
MONO%: 8.2 % (ref 0.0–14.0)
NEUT#: 4.4 10*3/uL (ref 1.5–6.5)
NEUT%: 66.6 % (ref 38.4–76.8)
Platelets: 234 10*3/uL (ref 145–400)
RBC: 3.46 10*6/uL — ABNORMAL LOW (ref 3.70–5.45)
RDW: 17.6 % — ABNORMAL HIGH (ref 11.2–14.5)
WBC: 6.6 10*3/uL (ref 3.9–10.3)
lymph#: 1.2 10*3/uL (ref 0.9–3.3)

## 2014-12-14 LAB — COMPREHENSIVE METABOLIC PANEL (CC13)
ALT: 8 U/L (ref 0–55)
ANION GAP: 10 meq/L (ref 3–11)
AST: 15 U/L (ref 5–34)
Albumin: 4 g/dL (ref 3.5–5.0)
Alkaline Phosphatase: 78 U/L (ref 40–150)
BUN: 37 mg/dL — ABNORMAL HIGH (ref 7.0–26.0)
CO2: 29 mEq/L (ref 22–29)
Calcium: 9.3 mg/dL (ref 8.4–10.4)
Chloride: 102 mEq/L (ref 98–109)
Creatinine: 1.6 mg/dL — ABNORMAL HIGH (ref 0.6–1.1)
EGFR: 27 mL/min/{1.73_m2} — AB (ref >90)
Glucose: 101 mg/dl (ref 70–140)
POTASSIUM: 4.5 meq/L (ref 3.5–5.1)
Sodium: 141 mEq/L (ref 136–145)
TOTAL PROTEIN: 7.7 g/dL (ref 6.4–8.3)
Total Bilirubin: 0.72 mg/dL (ref 0.20–1.20)

## 2014-12-14 LAB — VITAMIN B12: Vitamin B-12: >2000 pg/mL — ABNORMAL HIGH (ref 211–911)

## 2014-12-14 NOTE — Progress Notes (Signed)
Hgb: 10.3. Office note states to give aranesp to keep above 10g.  Patient aware.  No aranesp given. Results and schedule given to patient.

## 2014-12-24 ENCOUNTER — Other Ambulatory Visit: Payer: Self-pay | Admitting: Internal Medicine

## 2015-01-04 NOTE — Telephone Encounter (Signed)
Left msg on triage Monday afternoon @ 4:32 pharmacy has been trying to get mothers synthroid refill. Has fax office on several occassions. She on have 1 pill for tomorrow. Requesting refills today. Called daughter back (karen) inform her will send refill today...Gina Wilkinson

## 2015-01-07 ENCOUNTER — Telehealth: Payer: Self-pay

## 2015-01-07 NOTE — Telephone Encounter (Signed)
Pt decline flu vaccine

## 2015-01-10 ENCOUNTER — Other Ambulatory Visit: Payer: Self-pay | Admitting: Hematology and Oncology

## 2015-01-10 DIAGNOSIS — D469 Myelodysplastic syndrome, unspecified: Secondary | ICD-10-CM

## 2015-01-11 ENCOUNTER — Ambulatory Visit: Payer: Medicare Other

## 2015-01-11 ENCOUNTER — Other Ambulatory Visit (HOSPITAL_BASED_OUTPATIENT_CLINIC_OR_DEPARTMENT_OTHER): Payer: Medicare Other

## 2015-01-11 DIAGNOSIS — D469 Myelodysplastic syndrome, unspecified: Secondary | ICD-10-CM | POA: Diagnosis not present

## 2015-01-11 LAB — CBC WITH DIFFERENTIAL/PLATELET
BASO%: 0.6 % (ref 0.0–2.0)
Basophils Absolute: 0 10*3/uL (ref 0.0–0.1)
EOS ABS: 0.4 10*3/uL (ref 0.0–0.5)
EOS%: 6.3 % (ref 0.0–7.0)
HCT: 31.9 % — ABNORMAL LOW (ref 34.8–46.6)
HEMOGLOBIN: 10.4 g/dL — AB (ref 11.6–15.9)
LYMPH%: 20.5 % (ref 14.0–49.7)
MCH: 31.3 pg (ref 25.1–34.0)
MCHC: 32.6 g/dL (ref 31.5–36.0)
MCV: 96.1 fL (ref 79.5–101.0)
MONO#: 0.7 10*3/uL (ref 0.1–0.9)
MONO%: 11.3 % (ref 0.0–14.0)
NEUT#: 3.8 10*3/uL (ref 1.5–6.5)
NEUT%: 61.3 % (ref 38.4–76.8)
Platelets: 212 10*3/uL (ref 145–400)
RBC: 3.32 10*6/uL — AB (ref 3.70–5.45)
RDW: 16.4 % — AB (ref 11.2–14.5)
WBC: 6.2 10*3/uL (ref 3.9–10.3)
lymph#: 1.3 10*3/uL (ref 0.9–3.3)

## 2015-01-11 NOTE — Progress Notes (Signed)
[place] here for lab and possible injection.  HGB 10.4 today.  Will hold Aranesp due to HGB >10.0

## 2015-01-17 ENCOUNTER — Other Ambulatory Visit (INDEPENDENT_AMBULATORY_CARE_PROVIDER_SITE_OTHER): Payer: Medicare Other

## 2015-01-17 ENCOUNTER — Encounter: Payer: Self-pay | Admitting: Internal Medicine

## 2015-01-17 ENCOUNTER — Ambulatory Visit (INDEPENDENT_AMBULATORY_CARE_PROVIDER_SITE_OTHER): Payer: Medicare Other | Admitting: Internal Medicine

## 2015-01-17 VITALS — BP 110/64 | HR 76 | Temp 98.0°F | Resp 12 | Ht 72.0 in | Wt 141.4 lb

## 2015-01-17 DIAGNOSIS — E039 Hypothyroidism, unspecified: Secondary | ICD-10-CM

## 2015-01-17 DIAGNOSIS — I5032 Chronic diastolic (congestive) heart failure: Secondary | ICD-10-CM

## 2015-01-17 LAB — T4, FREE: Free T4: 1.4 ng/dL (ref 0.60–1.60)

## 2015-01-17 LAB — TSH: TSH: 0.08 u[IU]/mL — ABNORMAL LOW (ref 0.35–4.50)

## 2015-01-17 NOTE — Progress Notes (Signed)
Pre visit review using our clinic review tool, if applicable. No additional management support is needed unless otherwise documented below in the visit note. 

## 2015-01-17 NOTE — Patient Instructions (Signed)
We are going to recheck the kidney tests today. We are also going to check on the thyroid levels.   If you can try to drink at least 4 glasses of water per day that would be better for the kidneys.

## 2015-01-18 LAB — BASIC METABOLIC PANEL
BUN: 45 mg/dL — AB (ref 6–23)
CHLORIDE: 100 meq/L (ref 96–112)
CO2: 26 mEq/L (ref 19–32)
Calcium: 9.5 mg/dL (ref 8.4–10.5)
Creatinine, Ser: 1.66 mg/dL — ABNORMAL HIGH (ref 0.40–1.20)
GFR: 30.63 mL/min — ABNORMAL LOW (ref >60.00)
Glucose, Bld: 115 mg/dL — ABNORMAL HIGH (ref 70–99)
Potassium: 4.8 mEq/L (ref 3.5–5.1)
SODIUM: 137 meq/L (ref 135–145)

## 2015-01-18 NOTE — Assessment & Plan Note (Signed)
Recent lab work with kidney function decreased and Creatinine elevated. Recheck BMP today and if kidney function still less than normal will halve lasix dosing and return in 1 week for labs.

## 2015-01-18 NOTE — Progress Notes (Signed)
   Subjective:    Patient ID: Gina Wilkinson, female    DOB: 04-Jun-1920, 81 y.o.   MRN: [id number]  HPI The patient and her daughter are here to follow up on a lab result that her oncologist ordered about a month ago. They are concerned because her kidney function was less than before and she has problems with it before.   Review of Systems  Constitutional: Negative for diaphoresis, activity change, appetite change and fatigue.  HENT: Negative.   Eyes: Negative.   Respiratory: Negative for cough, chest tightness, shortness of breath and wheezing.   Cardiovascular: Negative for chest pain, palpitations and leg swelling.  Gastrointestinal: Negative for abdominal pain, diarrhea, constipation and abdominal distention.  Musculoskeletal: Positive for gait problem.       Uses walker well.  Neurological: Negative.       Objective:   Physical Exam  Constitutional: She appears well-developed and well-nourished. No distress.  HENT:  Head: Normocephalic and atraumatic.  Eyes: EOM are normal.  Neck: Normal range of motion.  Cardiovascular: Normal rate.   Murmur heard. Systolic murmur loud  Pulmonary/Chest: Effort normal. No respiratory distress. She has no wheezes. She has no rales.  Abdominal: Soft. Bowel sounds are normal. She exhibits no distension. There is no tenderness.  Neurological: She is alert.  Uses walker and gets around well.  Skin: Skin is warm and dry.   Filed Vitals:   01/17/15 1347  BP: 110/64  Pulse: 76  Temp: 98 F (36.7 C)  TempSrc: Oral  Resp: 12  Height: 6' (1.829 m)  Weight: 141 lb 6.4 oz (64.139 kg)  SpO2: 94%      Assessment & Plan:  Time spent on visit 15 minutes 50% of which was spent in face to face with the patient.

## 2015-01-19 ENCOUNTER — Other Ambulatory Visit: Payer: Self-pay | Admitting: Internal Medicine

## 2015-01-19 DIAGNOSIS — I5042 Chronic combined systolic (congestive) and diastolic (congestive) heart failure: Secondary | ICD-10-CM

## 2015-01-19 DIAGNOSIS — I5032 Chronic diastolic (congestive) heart failure: Secondary | ICD-10-CM

## 2015-01-20 ENCOUNTER — Other Ambulatory Visit: Payer: Self-pay | Admitting: Internal Medicine

## 2015-01-28 ENCOUNTER — Other Ambulatory Visit (INDEPENDENT_AMBULATORY_CARE_PROVIDER_SITE_OTHER): Payer: Medicare Other

## 2015-01-28 DIAGNOSIS — I5032 Chronic diastolic (congestive) heart failure: Secondary | ICD-10-CM

## 2015-01-28 LAB — BASIC METABOLIC PANEL
BUN: 38 mg/dL — ABNORMAL HIGH (ref 6–23)
CO2: 31 meq/L (ref 19–32)
Calcium: 9.8 mg/dL (ref 8.4–10.5)
Chloride: 101 mEq/L (ref 96–112)
Creatinine, Ser: 1.57 mg/dL — ABNORMAL HIGH (ref 0.40–1.20)
GFR: 32.67 mL/min — ABNORMAL LOW (ref >60.00)
GLUCOSE: 158 mg/dL — AB (ref 70–99)
Potassium: 4.8 mEq/L (ref 3.5–5.1)
SODIUM: 138 meq/L (ref 135–145)

## 2015-02-01 ENCOUNTER — Telehealth: Payer: Self-pay | Admitting: Internal Medicine

## 2015-02-01 NOTE — Telephone Encounter (Signed)
Patient's daughter would like the results of the patient's labs taken on 01/28/15.

## 2015-02-08 ENCOUNTER — Ambulatory Visit (HOSPITAL_BASED_OUTPATIENT_CLINIC_OR_DEPARTMENT_OTHER): Payer: Medicare Other | Admitting: Hematology and Oncology

## 2015-02-08 ENCOUNTER — Other Ambulatory Visit (HOSPITAL_BASED_OUTPATIENT_CLINIC_OR_DEPARTMENT_OTHER): Payer: Medicare Other

## 2015-02-08 ENCOUNTER — Telehealth: Payer: Self-pay | Admitting: Hematology and Oncology

## 2015-02-08 ENCOUNTER — Ambulatory Visit: Payer: Medicare Other

## 2015-02-08 VITALS — BP 129/65 | HR 64 | Temp 97.8°F | Resp 18 | Ht 72.0 in | Wt 142.2 lb

## 2015-02-08 DIAGNOSIS — D4620 Refractory anemia with excess of blasts, unspecified: Secondary | ICD-10-CM

## 2015-02-08 DIAGNOSIS — D469 Myelodysplastic syndrome, unspecified: Secondary | ICD-10-CM

## 2015-02-08 LAB — CBC WITH DIFFERENTIAL/PLATELET
BASO%: 1 % (ref 0.0–2.0)
BASOS ABS: 0.1 10*3/uL (ref 0.0–0.1)
EOS%: 6.6 % (ref 0.0–7.0)
Eosinophils Absolute: 0.4 10*3/uL (ref 0.0–0.5)
HEMATOCRIT: 31.8 % — AB (ref 34.8–46.6)
HEMOGLOBIN: 10.4 g/dL — AB (ref 11.6–15.9)
LYMPH%: 21 % (ref 14.0–49.7)
MCH: 31.8 pg (ref 25.1–34.0)
MCHC: 32.7 g/dL (ref 31.5–36.0)
MCV: 97.2 fL (ref 79.5–101.0)
MONO#: 0.8 10*3/uL (ref 0.1–0.9)
MONO%: 12.8 % (ref 0.0–14.0)
NEUT#: 3.7 10*3/uL (ref 1.5–6.5)
NEUT%: 58.6 % (ref 38.4–76.8)
PLATELETS: 211 10*3/uL (ref 145–400)
RBC: 3.27 10*6/uL — ABNORMAL LOW (ref 3.70–5.45)
RDW: 16.4 % — ABNORMAL HIGH (ref 11.2–14.5)
WBC: 6.2 10*3/uL (ref 3.9–10.3)
lymph#: 1.3 10*3/uL (ref 0.9–3.3)

## 2015-02-08 NOTE — Progress Notes (Signed)
[facility] OFFICE PROGRESS NOTE  Patient Care Team: Olga Millers, MD as PCP - General (Internal Medicine)  SUMMARY OF ONCOLOGIC HISTORY:  This pleasant lady was diagnosed with a low-grade myelodysplastic syndrome characterized by isolated normochromic anemia.  Bone marrow done 02/01/2009 was hypercellular for age. Mild dyserythropoiesis. 3% blasts. Normal cytogenetics. No ring sideroblasts. Hemoglobin 9.8 at that time with platelet count 367,000. Ferritin 329. Erythropoietin 69.9. She has had a durable response to intermittent erythropoietin injections currently on Aranesp 300 mcg every 4 weeks. We have been able to maintain hemoglobin at 10 g or above. In December 2014, the patient had hip fracture requiring surgery and blood transfusion. In May 2015, we increased Darbopoetin to 500 mcg every 4 weeks. The last dose of injection was November 2015  INTERVAL HISTORY: Please see below for problem oriented charting. She feels well. Denies any symptoms of anemia. The patient denies any recent signs or symptoms of bleeding such as spontaneous epistaxis, hematuria or hematochezia.   REVIEW OF SYSTEMS:   Constitutional: Denies fevers, chills or abnormal weight loss Eyes: Denies blurriness of vision Ears, nose, mouth, throat, and face: Denies mucositis or sore throat Respiratory: Denies cough, dyspnea or wheezes Cardiovascular: Denies palpitation, chest discomfort or lower extremity swelling Gastrointestinal:  Denies nausea, heartburn or change in bowel habits Skin: Denies abnormal skin rashes Lymphatics: Denies new lymphadenopathy or easy bruising Neurological:Denies numbness, tingling or new weaknesses Behavioral/Psych: Mood is stable, no new changes  All other systems were reviewed with the patient and are negative.  I have reviewed the past medical history, past surgical history, social history and family history with the patient and they are unchanged from previous  note.  ALLERGIES:  is allergic to levaquin; penicillins; and sulfonamide derivatives.  MEDICATIONS:  Current Outpatient Prescriptions  Medication Sig Dispense Refill  . aspirin 81 MG tablet Take 81 mg by mouth daily.    . Cholecalciferol (VITAMIN D3) 3000 UNITS TABS Take 1 capsule by mouth daily.    . ferrous sulfate 325 (65 FE) MG tablet Take 325 mg by mouth daily with breakfast.    . furosemide (LASIX) 40 MG tablet TAKE 1 TABLET BY MOUTH ONCE DAILY 30 tablet 6  . metoprolol tartrate (LOPRESSOR) 25 MG tablet TAKE 1/2 TABLET BY MOUTH TWICE A DAY 30 tablet 3  . NON FORMULARY Oxygen 2 Liters every night and as needed    . senna (SENOKOT) 8.6 MG TABS tablet Take 1 tablet by mouth every other day.    Marland Kitchen SYNTHROID 112 MCG tablet TAKE 1 TABLET (112 MCG TOTAL) BY MOUTH DAILY. CANNOT BE GENERIC. MUST BE SYNTHROID. 30 tablet 5  . vitamin B-12 (CYANOCOBALAMIN) 250 MCG tablet Take 250 mcg by mouth daily.     No current facility-administered medications for this visit.    PHYSICAL EXAMINATION: ECOG PERFORMANCE STATUS: 0 - Asymptomatic  Filed Vitals:   02/08/15 1519  BP: 129/65  Pulse: 64  Temp: 97.8 F (36.6 C)  Resp: 18   Filed Weights   02/08/15 1519  Weight: 142 lb 3.2 oz (64.501 kg)    GENERAL:alert, no distress and comfortable SKIN: skin color, texture, turgor are normal, no rashes or significant lesions EYES: normal, Conjunctiva are pink and non-injected, sclera clear  Musculoskeletal:no cyanosis of digits and no clubbing  NEURO: alert & oriented x 3 with fluent speech, no focal motor/sensory deficits  LABORATORY DATA:  I have reviewed the data as listed    Component Value Date/Time   NA 138  01/28/2015 1520   NA 141 12/14/2014 1508   K 4.8 01/28/2015 1520   K 4.5 12/14/2014 1508   CL 101 01/28/2015 1520   CL 105 07/25/2012 1005   CO2 31 01/28/2015 1520   CO2 29 12/14/2014 1508   GLUCOSE 158* 01/28/2015 1520   GLUCOSE 101 12/14/2014 1508   GLUCOSE 142* 07/25/2012 1005    BUN 38* 01/28/2015 1520   BUN 37.0* 12/14/2014 1508   CREATININE 1.57* 01/28/2015 1520   CREATININE 1.6* 12/14/2014 1508   CREATININE 1.10 03/15/2009 1014   CALCIUM 9.8 01/28/2015 1520   CALCIUM 9.3 12/14/2014 1508   PROT 7.7 12/14/2014 1508   PROT 7.4 09/06/2012 1250   ALBUMIN 4.0 12/14/2014 1508   ALBUMIN 3.7 09/06/2012 1250   AST 15 12/14/2014 1508   AST 25 09/06/2012 1250   ALT 8 12/14/2014 1508   ALT 14 09/06/2012 1250   ALKPHOS 78 12/14/2014 1508   ALKPHOS 64 09/06/2012 1250   BILITOT 0.72 12/14/2014 1508   BILITOT 1.2 09/06/2012 1250   GFRNONAA 34* 11/18/2013 0453   GFRAA 39* 11/18/2013 0453    No results found for: SPEP, UPEP  Lab Results  Component Value Date   WBC 6.2 02/08/2015   NEUTROABS 3.7 02/08/2015   HGB 10.4* 02/08/2015   HCT 31.8* 02/08/2015   MCV 97.2 02/08/2015   PLT 211 02/08/2015      Chemistry      Component Value Date/Time   NA 138 01/28/2015 1520   NA 141 12/14/2014 1508   K 4.8 01/28/2015 1520   K 4.5 12/14/2014 1508   CL 101 01/28/2015 1520   CL 105 07/25/2012 1005   CO2 31 01/28/2015 1520   CO2 29 12/14/2014 1508   BUN 38* 01/28/2015 1520   BUN 37.0* 12/14/2014 1508   CREATININE 1.57* 01/28/2015 1520   CREATININE 1.6* 12/14/2014 1508   CREATININE 1.10 03/15/2009 1014      Component Value Date/Time   CALCIUM 9.8 01/28/2015 1520   CALCIUM 9.3 12/14/2014 1508   ALKPHOS 78 12/14/2014 1508   ALKPHOS 64 09/06/2012 1250   AST 15 12/14/2014 1508   AST 25 09/06/2012 1250   ALT 8 12/14/2014 1508   ALT 14 09/06/2012 1250   BILITOT 0.72 12/14/2014 1508   BILITOT 1.2 09/06/2012 1250    ASSESSMENT & PLAN:  MDS (myelodysplastic syndrome) She has minimum response to Darbopoeitin 300 mcg. Since we increased to 500 mcg she has been doing well and has not received any further injections since November 2015 I will lengthen her appointment to every 3 months. In the meantime, I recommend she reduce iron supplement due to risk of iron  overload.    Orders Placed This Encounter  Procedures  . Erythropoietin    Standing Status: Future     Number of Occurrences:      Standing Expiration Date: 03/14/2016   All questions were answered. The patient knows to call the clinic with any problems, questions or concerns. No barriers to learning was detected. I spent 15 minutes counseling the patient face to face. The total time spent in the appointment was 20 minutes and more than 50% was on counseling and review of test results     [facility], [place], MD 02/08/2015 4:51 PM

## 2015-02-08 NOTE — Telephone Encounter (Signed)
left voicemail for pt regarding to June appt....mailed patient appt sched/avs and letter °

## 2015-02-08 NOTE — Assessment & Plan Note (Signed)
She has minimum response to Darbopoeitin 300 mcg. Since we increased to 500 mcg she has been doing well and has not received any further injections since November 2015 I will lengthen her appointment to every 3 months. In the meantime, I recommend she reduce iron supplement due to risk of iron overload.

## 2015-03-10 ENCOUNTER — Encounter: Payer: Self-pay | Admitting: Internal Medicine

## 2015-03-10 ENCOUNTER — Other Ambulatory Visit (INDEPENDENT_AMBULATORY_CARE_PROVIDER_SITE_OTHER): Payer: Medicare Other

## 2015-03-10 DIAGNOSIS — E039 Hypothyroidism, unspecified: Secondary | ICD-10-CM | POA: Diagnosis not present

## 2015-03-10 LAB — T4, FREE: Free T4: 1.56 ng/dL (ref 0.60–1.60)

## 2015-03-10 LAB — TSH: TSH: 0.05 u[IU]/mL — ABNORMAL LOW (ref 0.35–4.50)

## 2015-03-11 ENCOUNTER — Encounter: Payer: Self-pay | Admitting: Internal Medicine

## 2015-03-21 ENCOUNTER — Ambulatory Visit (INDEPENDENT_AMBULATORY_CARE_PROVIDER_SITE_OTHER): Payer: Medicare Other | Admitting: Family Medicine

## 2015-03-21 ENCOUNTER — Encounter: Payer: Self-pay | Admitting: Family Medicine

## 2015-03-21 VITALS — BP 102/65 | HR 58 | Temp 98.4°F | Ht 72.0 in | Wt 140.0 lb

## 2015-03-21 DIAGNOSIS — I10 Essential (primary) hypertension: Secondary | ICD-10-CM

## 2015-03-21 DIAGNOSIS — E038 Other specified hypothyroidism: Secondary | ICD-10-CM | POA: Diagnosis not present

## 2015-03-21 DIAGNOSIS — I5032 Chronic diastolic (congestive) heart failure: Secondary | ICD-10-CM | POA: Diagnosis not present

## 2015-03-21 LAB — BASIC METABOLIC PANEL
BUN: 34 mg/dL — AB (ref 6–23)
CALCIUM: 9.9 mg/dL (ref 8.4–10.5)
CO2: 27 mEq/L (ref 19–32)
CREATININE: 1.54 mg/dL — AB (ref 0.50–1.10)
Chloride: 101 mEq/L (ref 96–112)
Glucose, Bld: 99 mg/dL (ref 70–99)
Potassium: 4.8 mEq/L (ref 3.5–5.3)
SODIUM: 140 meq/L (ref 135–145)

## 2015-03-21 MED ORDER — FUROSEMIDE 40 MG PO TABS: 20.0000 mg | ORAL_TABLET | Freq: Every day | ORAL | Status: DC

## 2015-03-21 NOTE — Assessment & Plan Note (Signed)
Well controlled on Metoprolol 25 mg BID and Lasix 20 mg daily - no changes in current regimen

## 2015-03-21 NOTE — Patient Instructions (Signed)
Great to meet you all today!  Continue on Synthroid 185mcg. We will re-check the thyroid hormone levels in 3 months.  Continue Furosemide 20mg . We will check the BMP today and Dr. Ree Kida will call with the results.  See you for a follow-up in 3 months, and call or send a message on MyChart with any questions or concerns.

## 2015-03-21 NOTE — Assessment & Plan Note (Addendum)
Currently taking 112 mcg synthroid and denies fatigue, constipation, and dry skin. - continue 147mcg synthroid - re-check TSH at 3 month follow-up visit   Gina Wilkinson, MS3  I agree with the medical student assessment and plan. Recent labs showed normal T4 and slightly low TSH suggesting that Synthroid dose is too high. Discussed results with patient and family. They were hesitant to make changes in Synthroid dose as patient is asymptomatic. -will not change Synthroid dose at this time however will recheck in 3-6 months.

## 2015-03-21 NOTE — Assessment & Plan Note (Addendum)
Currently taking 20mg  furosemide once daily. No leg swelling or shortness of breath. BMP on 01/28/2015 indicated elevated creatinine and BUN. - recheck BMP today - continue 20mg  furosemide  Dois Davenport, MS3  I agree with the medical student assessment and plan. No signs/symtpoms of volume overload.  -continue Lasix 20 mg daily -recheck BMP to monitor Cr  Dossie Arbour MD

## 2015-03-21 NOTE — Progress Notes (Signed)
Subjective:     Patient ID: Gina Wilkinson, female   DOB: 16-May-1920, 81 y.o.   MRN: [id number] Written by: Dois Davenport, MS3  HPI Gina Wilkinson is a 81 year old female who presents today for a new patient visit with her two daughters, Gina Wilkinson and Gina Wilkinson. She denies any acute concerns and reports feeling well. She has previously been cared for at [facility].   Hypothyroid: She has been on varying doses Synthroid for many years. In the fall, her dose was decreased from 125 mcg to 112 mcg. She denies constipation/diarrhea, fatigue, weight gain, sweating, or increased agitation.   Atrial Fibrillation: She is currently taking Metoprolol. She denies palpitations, light-headedness, and shortness of breath. She is followed by cardiologist Cristopher Peru. She reports being on full anticoagulation at one point in time however this was stopped due to anemia.   CHF: She is currently taking 20mg  Lasix, decreased from 40mg  in the past few months due to increased creatinine. She denies leg swelling and shortness of breath.  Myelodysplastic Syndrome: Closely followed by Dr. Alvy Bimler at [facility]. Currently receiving Aranasp 532mcg injections, last administered in November of 2015 with a follow-up appointment on May 12, 2015.   Bruised Toenail: The home-health aide noticed that the toenail on the 4th toe of her left foot was purple. She denies any associated pain or numbness. No fevers/chills/redness of toe.   Social History: She lives alone in her house with 24/7 assistance from a Engineer, drilling.  Reviewed new patient form. PMH/PSH/Medications/Allergies/Social history reviewed and updated in Epic.    Review of Systems See HPI.     Objective:   Physical Exam BP 102/65 mmHg  Pulse 58  Temp(Src) 98.4 F (36.9 C) (Oral)  Ht 6' (1.829 m)  Wt 140 lb (63.504 kg)  BMI 18.98 kg/m2 Gen: pleasant well-appearing female in NAD HEENT: PERRLA, EOMI. No submandibular or cervical LAD. No  thyromegaly or thyroid nodules. Clear TMs. MMM. Normal nasal mucosa. CV: RRR, no MRG, nl S1 and S2. 3/6 systolic ejection murmur heard over the right upper sternal border. No leg edema. Pulmonary: Lungs CTA bilaterally. No crackles or wheezes. Abdominal: Normoactive bowel sounds. Soft and non-tender throughout. Midline sub-umbilical well-healed scar (previous appendectomy). Foot Exam: Fourth toenail on left foot dark, small hematoma present. Marked hallux valgus of bilateral feet. Normal DP pulses bilaterally.  MSK: uses wheeled walker with ambulation     Assessment:     Please see Problem List.    Plan:     Please see Problem List.     I personally saw and evaluated the patient. I have reviewed the medical student note and agree with the documentation. I personally completed the physical exam. Additions to the medical student note are made in blue.   Dossie Arbour MD

## 2015-03-22 ENCOUNTER — Ambulatory Visit: Payer: Medicare Other | Admitting: Internal Medicine

## 2015-03-23 ENCOUNTER — Telehealth: Payer: Self-pay | Admitting: Family Medicine

## 2015-03-23 DIAGNOSIS — R7989 Other specified abnormal findings of blood chemistry: Secondary | ICD-10-CM

## 2015-03-23 NOTE — Telephone Encounter (Signed)
Spoke to patient's daughter [name] about lab results. Cr still elevated however slightly improved. Continue lasix at 20 mg daily.

## 2015-03-24 ENCOUNTER — Telehealth: Payer: Self-pay | Admitting: Family Medicine

## 2015-03-24 NOTE — Telephone Encounter (Signed)
Daughter called and would like Dr. Ree Kida  To release the lab results to My Chart so that she can look at them. jw

## 2015-03-25 NOTE — Telephone Encounter (Signed)
Results released into Mychart.

## 2015-04-05 ENCOUNTER — Telehealth: Payer: Self-pay | Admitting: Hematology and Oncology

## 2015-04-05 NOTE — Telephone Encounter (Signed)
pt called and confirmed to moved 6.16 appt to earlier time...done....pt aware of new d.t

## 2015-04-05 NOTE — Telephone Encounter (Signed)
returned call and lvm to call back to r/s appt.Marland KitchenMarland Kitchen

## 2015-04-11 ENCOUNTER — Ambulatory Visit (HOSPITAL_BASED_OUTPATIENT_CLINIC_OR_DEPARTMENT_OTHER): Payer: Medicare Other

## 2015-04-11 ENCOUNTER — Telehealth: Payer: Self-pay | Admitting: *Deleted

## 2015-04-11 ENCOUNTER — Other Ambulatory Visit: Payer: Self-pay | Admitting: *Deleted

## 2015-04-11 DIAGNOSIS — D469 Myelodysplastic syndrome, unspecified: Secondary | ICD-10-CM

## 2015-04-11 LAB — CBC WITH DIFFERENTIAL/PLATELET
BASO%: 1.2 % (ref 0.0–2.0)
BASOS ABS: 0.1 10*3/uL (ref 0.0–0.1)
EOS%: 5 % (ref 0.0–7.0)
Eosinophils Absolute: 0.3 10*3/uL (ref 0.0–0.5)
HCT: 31.5 % — ABNORMAL LOW (ref 34.8–46.6)
HEMOGLOBIN: 10.4 g/dL — AB (ref 11.6–15.9)
LYMPH%: 14 % (ref 14.0–49.7)
MCH: 31.2 pg (ref 25.1–34.0)
MCHC: 33.1 g/dL (ref 31.5–36.0)
MCV: 94.1 fL (ref 79.5–101.0)
MONO#: 0.5 10*3/uL (ref 0.1–0.9)
MONO%: 10.1 % (ref 0.0–14.0)
NEUT#: 3.7 10*3/uL (ref 1.5–6.5)
NEUT%: 69.7 % (ref 38.4–76.8)
Platelets: 215 10*3/uL (ref 145–400)
RBC: 3.35 10*6/uL — ABNORMAL LOW (ref 3.70–5.45)
RDW: 16.3 % — ABNORMAL HIGH (ref 11.2–14.5)
WBC: 5.4 10*3/uL (ref 3.9–10.3)
lymph#: 0.8 10*3/uL — ABNORMAL LOW (ref 0.9–3.3)

## 2015-04-11 NOTE — Telephone Encounter (Signed)
OK 

## 2015-04-11 NOTE — Telephone Encounter (Signed)
Informed daughter of CBC results stable.  She had gotten results earlier today and verbalized understanding.  They plan to keep appt in June as scheduled.

## 2015-04-11 NOTE — Telephone Encounter (Signed)
-----   Message from Heath Lark, MD sent at 04/11/2015 11:53 AM EDT ----- Regarding: no change in CBC   ----- Message -----    From: [facility]    Sent: 04/11/2015  11:47 AM      To: Heath Lark, MD

## 2015-04-11 NOTE — Telephone Encounter (Signed)
TC from daughter, [name]. She states that her mother has started looking very pale the last few days and [name] would like to have CBC checked. Last CBC was in March. Her next appt with Dr. Alvy Bimler is 05/12/15.  Last aranesp injection was in November.  Scheduled patient for CBC this morning.

## 2015-04-13 LAB — ERYTHROPOIETIN: ERYTHROPOIETIN: 56.1 m[IU]/mL — AB (ref 2.6–18.5)

## 2015-04-22 ENCOUNTER — Encounter: Payer: Self-pay | Admitting: Family Medicine

## 2015-05-11 ENCOUNTER — Other Ambulatory Visit: Payer: Self-pay | Admitting: Internal Medicine

## 2015-05-12 ENCOUNTER — Other Ambulatory Visit (HOSPITAL_BASED_OUTPATIENT_CLINIC_OR_DEPARTMENT_OTHER): Payer: Medicare Other

## 2015-05-12 ENCOUNTER — Encounter: Payer: Self-pay | Admitting: Hematology and Oncology

## 2015-05-12 ENCOUNTER — Ambulatory Visit (HOSPITAL_BASED_OUTPATIENT_CLINIC_OR_DEPARTMENT_OTHER): Payer: Medicare Other | Admitting: Hematology and Oncology

## 2015-05-12 ENCOUNTER — Other Ambulatory Visit: Payer: Medicare Other

## 2015-05-12 ENCOUNTER — Ambulatory Visit: Payer: Medicare Other

## 2015-05-12 ENCOUNTER — Other Ambulatory Visit: Payer: Self-pay

## 2015-05-12 ENCOUNTER — Ambulatory Visit: Payer: Medicare Other | Admitting: Hematology and Oncology

## 2015-05-12 VITALS — BP 124/39 | HR 75 | Temp 98.1°F | Resp 18 | Wt 140.8 lb

## 2015-05-12 DIAGNOSIS — E038 Other specified hypothyroidism: Secondary | ICD-10-CM

## 2015-05-12 DIAGNOSIS — D469 Myelodysplastic syndrome, unspecified: Secondary | ICD-10-CM

## 2015-05-12 LAB — CBC WITH DIFFERENTIAL/PLATELET
BASO%: 1 % (ref 0.0–2.0)
Basophils Absolute: 0.1 10*3/uL (ref 0.0–0.1)
EOS%: 4.2 % (ref 0.0–7.0)
Eosinophils Absolute: 0.3 10*3/uL (ref 0.0–0.5)
HCT: 33.3 % — ABNORMAL LOW (ref 34.8–46.6)
HGB: 10.6 g/dL — ABNORMAL LOW (ref 11.6–15.9)
LYMPH#: 1.3 10*3/uL (ref 0.9–3.3)
LYMPH%: 18.4 % (ref 14.0–49.7)
MCH: 30.5 pg (ref 25.1–34.0)
MCHC: 31.8 g/dL (ref 31.5–36.0)
MCV: 96 fL (ref 79.5–101.0)
MONO#: 0.8 10*3/uL (ref 0.1–0.9)
MONO%: 10.5 % (ref 0.0–14.0)
NEUT#: 4.8 10*3/uL (ref 1.5–6.5)
NEUT%: 65.9 % (ref 38.4–76.8)
NRBC: 0 % (ref 0–0)
Platelets: 235 10*3/uL (ref 145–400)
RBC: 3.47 10*6/uL — AB (ref 3.70–5.45)
RDW: 15.6 % — AB (ref 11.2–14.5)
WBC: 7.2 10*3/uL (ref 3.9–10.3)

## 2015-05-12 MED ORDER — FUROSEMIDE 40 MG PO TABS: 40.0000 mg | ORAL_TABLET | Freq: Every day | ORAL | Status: DC

## 2015-05-12 MED ORDER — SYNTHROID 112 MCG PO TABS: ORAL_TABLET | ORAL | Status: DC

## 2015-05-12 NOTE — Assessment & Plan Note (Addendum)
She carried a diagnosis of myelodysplastic syndrome in the past based on a bone marrow biopsy in 2010. She had been receiving darbepoetin for a while but has not needed any further injections since November 2015. It is not clear to me that she indeed had myelodysplastic syndrome. Recommend CBC twice a year and observation only. To save her some time and travel time, I am willing to have her get her blood count monitoring through her primary care provider and I will see her back again whenever her hemoglobin dropped to less than 10 g. She does not require any injections today.

## 2015-05-12 NOTE — Progress Notes (Signed)
[facility] OFFICE PROGRESS NOTE  Patient Care Team: Lupita Dawn, MD as PCP - General (Family Medicine) Evans Lance, MD as Consulting Physician (Cardiology) Heath Lark, MD as Consulting Physician (Hematology and Oncology)  SUMMARY OF ONCOLOGIC HISTORY: This pleasant lady was diagnosed with a low-grade myelodysplastic syndrome characterized by isolated normochromic anemia.  Bone marrow done 02/01/2009 was hypercellular for age. Mild dyserythropoiesis. 3% blasts. Normal cytogenetics. No ring sideroblasts. Hemoglobin 9.8 at that time with platelet count 367,000. Ferritin 329. Erythropoietin 69.9. She has had a durable response to intermittent erythropoietin injections currently on Aranesp 300 mcg every 4 weeks. In December 2014, the patient had hip fracture requiring surgery and blood transfusion. In May 2015, we increased Darbopoetin to 500 mcg every 4 weeks. The last dose of injection was November 2015  INTERVAL HISTORY: Please see below for problem oriented charting. She feels well. Denies any symptoms of anemia. The patient denies any recent signs or symptoms of bleeding such as spontaneous epistaxis, hematuria or hematochezia.  REVIEW OF SYSTEMS:   Constitutional: Denies fevers, chills or abnormal weight loss Eyes: Denies blurriness of vision Ears, nose, mouth, throat, and face: Denies mucositis or sore throat Respiratory: Denies cough, dyspnea or wheezes Cardiovascular: Denies palpitation, chest discomfort or lower extremity swelling Gastrointestinal:  Denies nausea, heartburn or change in bowel habits Skin: Denies abnormal skin rashes Lymphatics: Denies new lymphadenopathy or easy bruising Neurological:Denies numbness, tingling or new weaknesses Behavioral/Psych: Mood is stable, no new changes  All other systems were reviewed with the patient and are negative.  I have reviewed the past medical history, past surgical history, social history and family history with  the patient and they are unchanged from previous note.  ALLERGIES:  is allergic to levaquin; penicillins; and sulfonamide derivatives.  MEDICATIONS:  Current Outpatient Prescriptions  Medication Sig Dispense Refill  . aspirin 81 MG tablet Take 81 mg by mouth daily.    . Cholecalciferol (VITAMIN D3) 3000 UNITS TABS Take 1 capsule by mouth daily.    . Darbepoetin Alfa (ARANESP) 500 MCG/ML SOSY injection Inject 500 mcg into the skin once.    . ferrous sulfate 325 (65 FE) MG tablet Take 325 mg by mouth 2 (two) times a week.     . furosemide (LASIX) 40 MG tablet Take 0.5 tablets (20 mg total) by mouth daily. 30 tablet 6  . metoprolol tartrate (LOPRESSOR) 25 MG tablet TAKE 1/2 TABLET BY MOUTH TWICE A DAY 30 tablet 3  . NON FORMULARY Oxygen 2 Liters every night and as needed    . senna (SENOKOT) 8.6 MG TABS tablet Take 1 tablet by mouth every other day.    Marland Kitchen SYNTHROID 112 MCG tablet TAKE 1 TABLET (112 MCG TOTAL) BY MOUTH DAILY. CANNOT BE GENERIC. MUST BE SYNTHROID. 30 tablet 1  . vitamin B-12 (CYANOCOBALAMIN) 250 MCG tablet Take 250 mcg by mouth daily.     No current facility-administered medications for this visit.    PHYSICAL EXAMINATION: ECOG PERFORMANCE STATUS: 1 - Symptomatic but completely ambulatory  Filed Vitals:   05/12/15 1148  BP: 124/39  Pulse: 75  Temp: 98.1 F (36.7 C)  Resp: 18   Filed Weights   05/12/15 1148  Weight: 140 lb 12.8 oz (63.866 kg)    GENERAL:alert, no distress and comfortable SKIN: skin color, texture, turgor are normal, no rashes or significant lesions EYES: normal, Conjunctiva are pink and non-injected, sclera clear Musculoskeletal:no cyanosis of digits and no clubbing  NEURO: alert & oriented x 3  with fluent speech, no focal motor/sensory deficits  LABORATORY DATA:  I have reviewed the data as listed    Component Value Date/Time   NA 140 03/21/2015 1222   NA 141 12/14/2014 1508   K 4.8 03/21/2015 1222   K 4.5 12/14/2014 1508   CL 101  03/21/2015 1222   CL 105 07/25/2012 1005   CO2 27 03/21/2015 1222   CO2 29 12/14/2014 1508   GLUCOSE 99 03/21/2015 1222   GLUCOSE 101 12/14/2014 1508   GLUCOSE 142* 07/25/2012 1005   BUN 34* 03/21/2015 1222   BUN 37.0* 12/14/2014 1508   CREATININE 1.54* 03/21/2015 1222   CREATININE 1.57* 01/28/2015 1520   CREATININE 1.6* 12/14/2014 1508   CREATININE 1.10 03/15/2009 1014   CALCIUM 9.9 03/21/2015 1222   CALCIUM 9.3 12/14/2014 1508   PROT 7.7 12/14/2014 1508   PROT 7.4 09/06/2012 1250   ALBUMIN 4.0 12/14/2014 1508   ALBUMIN 3.7 09/06/2012 1250   AST 15 12/14/2014 1508   AST 25 09/06/2012 1250   ALT 8 12/14/2014 1508   ALT 14 09/06/2012 1250   ALKPHOS 78 12/14/2014 1508   ALKPHOS 64 09/06/2012 1250   BILITOT 0.72 12/14/2014 1508   BILITOT 1.2 09/06/2012 1250   GFRNONAA 34* 11/18/2013 0453   GFRAA 39* 11/18/2013 0453    No results found for: SPEP, UPEP  Lab Results  Component Value Date   WBC 7.2 05/12/2015   NEUTROABS 4.8 05/12/2015   HGB 10.6* 05/12/2015   HCT 33.3* 05/12/2015   MCV 96.0 05/12/2015   PLT 235 05/12/2015      Chemistry      Component Value Date/Time   NA 140 03/21/2015 1222   NA 141 12/14/2014 1508   K 4.8 03/21/2015 1222   K 4.5 12/14/2014 1508   CL 101 03/21/2015 1222   CL 105 07/25/2012 1005   CO2 27 03/21/2015 1222   CO2 29 12/14/2014 1508   BUN 34* 03/21/2015 1222   BUN 37.0* 12/14/2014 1508   CREATININE 1.54* 03/21/2015 1222   CREATININE 1.57* 01/28/2015 1520   CREATININE 1.6* 12/14/2014 1508   CREATININE 1.10 03/15/2009 1014      Component Value Date/Time   CALCIUM 9.9 03/21/2015 1222   CALCIUM 9.3 12/14/2014 1508   ALKPHOS 78 12/14/2014 1508   ALKPHOS 64 09/06/2012 1250   AST 15 12/14/2014 1508   AST 25 09/06/2012 1250   ALT 8 12/14/2014 1508   ALT 14 09/06/2012 1250   BILITOT 0.72 12/14/2014 1508   BILITOT 1.2 09/06/2012 1250     ASSESSMENT & PLAN:  MDS (myelodysplastic syndrome) She carried a diagnosis of  myelodysplastic syndrome in the past based on a bone marrow biopsy in 2010. She had been receiving darbepoetin for a while but has not needed any further injections since November 2015. It is not clear to me that she indeed had myelodysplastic syndrome. Recommend CBC twice a year and observation only. To save her some time and travel time, I am willing to have her get her blood count monitoring through her primary care provider and I will see her back again whenever her hemoglobin dropped to less than 10 g. She does not require any injections today.  Hypothyroidism -She is running out of Synthroid. I will refill her prescription one more time but recommend she follow with primary care doctor for future medication refill.   No orders of the defined types were placed in this encounter.   All questions were answered. The patient knows  to call the clinic with any problems, questions or concerns. No barriers to learning was detected. I spent 15 minutes counseling the patient face to face. The total time spent in the appointment was 20 minutes and more than 50% was on counseling and review of test results     [facility], [place], MD 05/12/2015 12:34 PM

## 2015-05-12 NOTE — Assessment & Plan Note (Signed)
-  She is running out of Synthroid. I will refill her prescription one more time but recommend she follow with primary care doctor for future medication refill.

## 2015-06-05 ENCOUNTER — Other Ambulatory Visit: Payer: Self-pay | Admitting: Internal Medicine

## 2015-06-06 ENCOUNTER — Ambulatory Visit (INDEPENDENT_AMBULATORY_CARE_PROVIDER_SITE_OTHER): Payer: Medicare Other | Admitting: Family Medicine

## 2015-06-06 ENCOUNTER — Encounter: Payer: Self-pay | Admitting: Family Medicine

## 2015-06-06 VITALS — BP 127/50 | HR 56 | Temp 97.9°F | Ht 66.0 in | Wt 140.1 lb

## 2015-06-06 DIAGNOSIS — E038 Other specified hypothyroidism: Secondary | ICD-10-CM

## 2015-06-06 DIAGNOSIS — D609 Acquired pure red cell aplasia, unspecified: Secondary | ICD-10-CM

## 2015-06-06 DIAGNOSIS — Z0000 Encounter for general adult medical examination without abnormal findings: Secondary | ICD-10-CM

## 2015-06-06 DIAGNOSIS — I5032 Chronic diastolic (congestive) heart failure: Secondary | ICD-10-CM

## 2015-06-06 DIAGNOSIS — I482 Chronic atrial fibrillation, unspecified: Secondary | ICD-10-CM

## 2015-06-06 MED ORDER — METOPROLOL TARTRATE 25 MG PO TABS: 12.5000 mg | ORAL_TABLET | Freq: Two times a day (BID) | ORAL | Status: DC

## 2015-06-06 MED ORDER — SYNTHROID 112 MCG PO TABS: ORAL_TABLET | ORAL | Status: DC

## 2015-06-06 NOTE — Assessment & Plan Note (Signed)
No volume overload. Patient asymptomatic.  -continue lasix 20 mg daily and consider gradually reducing.

## 2015-06-06 NOTE — Progress Notes (Signed)
Subjective:     Patient ID: Gina Wilkinson, female   DOB: 1920/06/10, 81 y.o.   MRN: [id number]  HPI  81 y/o female presents for routine follow up. [name] daughters are present and provide the majority of the history.   HPI Scribed by Brett Fairy MS3  THYROID: - The patient has been taking her 112mg  of the synthroid since October last year where it was changed from 125mg .  - The patient denies, diarrhea, constipation and states that her hair is coming back in. She states that she is feeling fine.  ANEMIA: - The patient has not been getting the epogen injections because the bone marrow has "come back to life", They will only restart the epo shots if the Hb drops below 10 per family.  HF: - Patient wanted to change lasix to 20mg  because she has only been taking 1/2 a pill of 40mg . They wanted this changed to a 20mg  pill.  - The patient states that her legs are fine and not swollen.  - The patient is wearing her O2 at night 1-2L. - The patient states that she is sleeping well and is not waking up short of breath.  - Dec 2014 the patient was placed on lasix and she fell and broke her hip due to urgency to get to the bathroom. She is doing fine and the doctors were able to fix her hip.  - the patient denies chest pain and shortness of breath.   Health maintenance was discussed with the patient and she states that she does not believe in shots and would not like to receive them. She is due for her Zoster, Tdap, and PCV13.    Bunyan bilateral.  - The patient states that her feet are not bothering her and her shoes are comfortable.   Review of Systems  Constitutional: Negative for fever and chills.  Respiratory: Negative for shortness of breath and wheezing.   Cardiovascular: Negative for chest pain, palpitations and leg swelling.  Gastrointestinal: Negative for nausea, diarrhea and constipation.  Endocrine: Negative for cold intolerance and heat intolerance.   Pertinent ROS  as per HPI    Objective:   Physical Exam Physical Exam Vitals: reviewed Gen: pleasant female, NAD HEENT: normocephalic, PERRL, EOMI, no scleral icterus Cardiac: irregular irregular rhythm, S1 and S2 present, 3/6 systolic murmur heard best over bilateral 2nd intercostal space Resp: CTAB, normal effort Ext: trace edema, bilateral foot bunions, no foot ulcerations  Reviewed lab work from previous 3 months.     Assessment:     Please see problem specific assessment and plan.      Plan:     Please see problem specific assessment and plan.      I personally saw and evaluated the patient. I have reviewed the medical student note and agree with the documentation. I personally completed the physical exam. Additions to the medical student note are made in blue.   Dossie Arbour MD

## 2015-06-06 NOTE — Assessment & Plan Note (Signed)
Patient declined routine vaccines today. See note.

## 2015-06-06 NOTE — Assessment & Plan Note (Signed)
Patient asymptomatic. Rate controlled. Not a candidate for anticoagulation. -continue to monitor

## 2015-06-06 NOTE — Assessment & Plan Note (Signed)
Reviewed recent TSH (low). T4 normal. As patient is asymptomatic will keep synthroid at current dose. -recheck TSH in 6 months from previous TSH in April

## 2015-06-06 NOTE — Patient Instructions (Addendum)
-   You are doing great and I do not have any concerns. - In 6 months we will see you again and there is no need to to more labs today. If anything new happens to you don't hesitate to come in and see [place].  - If no new symptoms like swelling or shortness of breath occur we will keep the lasix the way it is. - Kidneys are stable and we are not worried. We will check your kidneys again in 6 months. Will check BMP and CBC at next visit in 6 months.  - [place] all goes well and thank you for coming in to see [place].

## 2015-06-06 NOTE — Assessment & Plan Note (Signed)
Patient has a history of anemia secondary to myelodysplastic syndrome. Hbg stable recently. No longer taking Aranesp. Has been released by Dr. Alvy Bimler -check CBC Q6 months to monitor hemoglogin

## 2015-07-02 ENCOUNTER — Other Ambulatory Visit: Payer: Self-pay | Admitting: Internal Medicine

## 2015-07-04 ENCOUNTER — Other Ambulatory Visit: Payer: Self-pay

## 2015-07-13 ENCOUNTER — Encounter: Payer: Self-pay | Admitting: Family Medicine

## 2015-07-13 NOTE — Progress Notes (Signed)
Received request from patient's daughter [name] to complete [employer] form for oxygen concentrator. I have completed the form and will fax to [employer].

## 2015-09-03 ENCOUNTER — Other Ambulatory Visit: Payer: Self-pay | Admitting: Family Medicine

## 2015-09-08 ENCOUNTER — Other Ambulatory Visit: Payer: Self-pay | Admitting: Family Medicine

## 2015-09-08 NOTE — Telephone Encounter (Signed)
Pt daughter calling and would like for the PCP to know that the reason this request was sent early (on 09/03/2015) was because she takes care of the patient and she gets worried when the patient starts to run low. She states that the patient will be out by Saturday and she handles all of her medications for her, so she likes to request refills a week in advance to ensure that the patient doesn't run out. Sadie Reynolds, ASA

## 2015-09-09 ENCOUNTER — Other Ambulatory Visit: Payer: Self-pay | Admitting: Family Medicine

## 2015-09-26 ENCOUNTER — Other Ambulatory Visit: Payer: Self-pay | Admitting: *Deleted

## 2015-09-27 ENCOUNTER — Other Ambulatory Visit: Payer: Self-pay | Admitting: Family Medicine

## 2015-09-27 ENCOUNTER — Other Ambulatory Visit: Payer: Self-pay | Admitting: *Deleted

## 2015-09-27 MED ORDER — SYNTHROID 112 MCG PO TABS: ORAL_TABLET | ORAL | Status: DC

## 2015-10-07 ENCOUNTER — Ambulatory Visit (INDEPENDENT_AMBULATORY_CARE_PROVIDER_SITE_OTHER): Payer: Medicare Other | Admitting: Family Medicine

## 2015-10-07 ENCOUNTER — Encounter: Payer: Self-pay | Admitting: Family Medicine

## 2015-10-07 VITALS — BP 122/85 | HR 61 | Temp 97.8°F | Ht 66.0 in | Wt 135.8 lb

## 2015-10-07 DIAGNOSIS — E038 Other specified hypothyroidism: Secondary | ICD-10-CM

## 2015-10-07 DIAGNOSIS — D469 Myelodysplastic syndrome, unspecified: Secondary | ICD-10-CM

## 2015-10-07 DIAGNOSIS — Z0000 Encounter for general adult medical examination without abnormal findings: Secondary | ICD-10-CM

## 2015-10-07 DIAGNOSIS — D609 Acquired pure red cell aplasia, unspecified: Secondary | ICD-10-CM

## 2015-10-07 LAB — BASIC METABOLIC PANEL
BUN: 33 mg/dL — AB (ref 7–25)
CHLORIDE: 103 mmol/L (ref 98–110)
CO2: 25 mmol/L (ref 20–31)
Calcium: 10 mg/dL (ref 8.6–10.4)
Creat: 1.75 mg/dL — ABNORMAL HIGH (ref 0.60–0.88)
Glucose, Bld: 95 mg/dL (ref 65–99)
POTASSIUM: 6.1 mmol/L — AB (ref 3.5–5.3)
Sodium: 141 mmol/L (ref 135–146)

## 2015-10-07 LAB — CBC
HCT: 31.8 % — ABNORMAL LOW (ref 36.0–46.0)
Hemoglobin: 10.4 g/dL — ABNORMAL LOW (ref 12.0–15.0)
MCH: 30.5 pg (ref 26.0–34.0)
MCHC: 32.7 g/dL (ref 30.0–36.0)
MCV: 93.3 fL (ref 78.0–100.0)
MPV: 10.4 fL (ref 8.6–12.4)
PLATELETS: 230 10*3/uL (ref 150–400)
RBC: 3.41 MIL/uL — AB (ref 3.87–5.11)
RDW: 16 % — AB (ref 11.5–15.5)
WBC: 5.6 10*3/uL (ref 4.0–10.5)

## 2015-10-07 LAB — TSH: TSH: 0.019 u[IU]/mL — AB (ref 0.350–4.500)

## 2015-10-07 NOTE — Progress Notes (Signed)
   Subjective:    Patient ID: Gina Wilkinson, female    DOB: 26-Feb-1920, 81 y.o.   MRN: [id number]  HPI 81 year old Caucasian female presents for routine follow-up. Accompanied today by her 2 daughters.  Anemia secondary to myelodysplastic syndrome - no fatigue, no shortness of breath, no bruising, no active bleeding, compliant with daily iron, has not needed EPO in over one year  Hypothyroidism - currently on levothyroxine 112 g daily, she reports improved hair growth, No constipation or diarrhea, no dry skin  Preventative-patient declines TD, Zostavax, and flu vaccine today.  Hypertension-daughter reports occasional low blood pressures, systolic as low as [id number], better over the past few weeks, patient denies dizziness or lightheadedness, no chest pain, no syncope, no loss of consciousness, metoprolol 12.5 mg twice a day  Social-never smoker  Review of Systems  Constitutional: Negative for fever, chills and fatigue.  Respiratory: Negative for cough and shortness of breath.   Gastrointestinal: Negative for nausea, vomiting, abdominal pain and diarrhea.       Objective:   Physical Exam Vitals: Reviewed Gen.: Pleasant female, no acute distress Cardiac: Regular rate and rhythm, S1 and S2 present, 3/6 systolic murmur heard best over the left second intercostal space Respiratory: Clear to auscultation bilaterally, normal effort Extremities: Trace edema, 2+ radial pulses bilaterally Skin: Small bruises present on multiple areas of upper extremities      Assessment & Plan:  MDS (myelodysplastic syndrome) Patient has chronic anemia secondary to myelodysplastic syndrome. Currently asymptomatic. -Check CBC to monitor anemia  Anemia Patient has chronic anemia secondary to myelodysplastic syndrome. Currently asymptomatic. -Check CBC to monitor anemia  Hypothyroidism Hypothyroidism controlled with levothyroxine. -TSH checked today  Preventative health care Patient declines flu,  tetanus, pneumonia, and shingles vaccines today.

## 2015-10-07 NOTE — Assessment & Plan Note (Signed)
Patient has chronic anemia secondary to myelodysplastic syndrome. Currently asymptomatic. -Check CBC to monitor anemia

## 2015-10-07 NOTE — Patient Instructions (Signed)
It was nice to see you today.  Dr. Ree Kida will check your labs today and give you a call.

## 2015-10-07 NOTE — Assessment & Plan Note (Signed)
Patient declines flu, tetanus, pneumonia, and shingles vaccines today.

## 2015-10-07 NOTE — Assessment & Plan Note (Signed)
Hypothyroidism controlled with levothyroxine. -TSH checked today

## 2015-10-10 ENCOUNTER — Telehealth: Payer: Self-pay | Admitting: Family Medicine

## 2015-10-10 DIAGNOSIS — E875 Hyperkalemia: Secondary | ICD-10-CM

## 2015-10-10 NOTE — Telephone Encounter (Signed)
Left message stating that I was calling to discuss lab results. Patient needs to schedule lab visit in the next 1-2 days to recheck potassium level. I will attempt to contact again tomorrow morning.

## 2015-10-11 ENCOUNTER — Telehealth: Payer: Self-pay | Admitting: *Deleted

## 2015-10-11 ENCOUNTER — Other Ambulatory Visit: Payer: Medicare Other

## 2015-10-11 DIAGNOSIS — E875 Hyperkalemia: Secondary | ICD-10-CM | POA: Diagnosis not present

## 2015-10-11 LAB — BASIC METABOLIC PANEL
BUN: 30 mg/dL — AB (ref 7–25)
CO2: 27 mmol/L (ref 20–31)
Calcium: 9.7 mg/dL (ref 8.6–10.4)
Chloride: 102 mmol/L (ref 98–110)
Creat: 1.61 mg/dL — ABNORMAL HIGH (ref 0.60–0.88)
Glucose, Bld: 84 mg/dL (ref 65–99)
POTASSIUM: 4.4 mmol/L (ref 3.5–5.3)
SODIUM: 141 mmol/L (ref 135–146)

## 2015-10-11 NOTE — Telephone Encounter (Signed)
Called patient, no answer, left message stating that I will call after repeat BMP is back.

## 2015-10-11 NOTE — Telephone Encounter (Signed)
Pt and her daughter came in for a lab appt. Wants to know if her BUN and creatinine should concerning? Deseree Kennon Holter, CMA

## 2015-10-11 NOTE — Progress Notes (Signed)
Bmp done today Gina Wilkinson 

## 2015-10-12 ENCOUNTER — Encounter: Payer: Self-pay | Admitting: Family Medicine

## 2015-10-12 NOTE — Telephone Encounter (Signed)
Attempted to contact patient with results, no answer, sent MyChart message.

## 2015-11-16 ENCOUNTER — Other Ambulatory Visit: Payer: Self-pay | Admitting: Hematology and Oncology

## 2016-01-13 ENCOUNTER — Other Ambulatory Visit: Payer: Self-pay | Admitting: Internal Medicine

## 2016-01-17 ENCOUNTER — Encounter (HOSPITAL_COMMUNITY): Payer: Self-pay | Admitting: Emergency Medicine

## 2016-01-17 ENCOUNTER — Emergency Department (HOSPITAL_COMMUNITY): Payer: Medicare Other

## 2016-01-17 ENCOUNTER — Inpatient Hospital Stay (HOSPITAL_COMMUNITY)
Admission: EM | Admit: 2016-01-17 | Discharge: 2016-01-20 | DRG: 291 | Disposition: A | Discharge status: Home Health | Payer: Medicare Other | Attending: Family Medicine | Admitting: Family Medicine

## 2016-01-17 DIAGNOSIS — T428X5A Adverse effect of antiparkinsonism drugs and other central muscle-tone depressants, initial encounter: Secondary | ICD-10-CM | POA: Diagnosis present

## 2016-01-17 DIAGNOSIS — I509 Heart failure, unspecified: Secondary | ICD-10-CM | POA: Diagnosis not present

## 2016-01-17 DIAGNOSIS — G92 Toxic encephalopathy: Secondary | ICD-10-CM | POA: Diagnosis present

## 2016-01-17 DIAGNOSIS — N179 Acute kidney failure, unspecified: Secondary | ICD-10-CM | POA: Diagnosis not present

## 2016-01-17 DIAGNOSIS — E039 Hypothyroidism, unspecified: Secondary | ICD-10-CM | POA: Diagnosis present

## 2016-01-17 DIAGNOSIS — R109 Unspecified abdominal pain: Secondary | ICD-10-CM | POA: Insufficient documentation

## 2016-01-17 DIAGNOSIS — R64 Cachexia: Secondary | ICD-10-CM | POA: Diagnosis not present

## 2016-01-17 DIAGNOSIS — Z66 Do not resuscitate: Secondary | ICD-10-CM | POA: Diagnosis not present

## 2016-01-17 DIAGNOSIS — Y93E1 Activity, personal bathing and showering: Secondary | ICD-10-CM | POA: Diagnosis not present

## 2016-01-17 DIAGNOSIS — R402421 Glasgow coma scale score 9-12, in the field [EMT or ambulance]: Secondary | ICD-10-CM | POA: Diagnosis not present

## 2016-01-17 DIAGNOSIS — I4510 Unspecified right bundle-branch block: Secondary | ICD-10-CM | POA: Diagnosis present

## 2016-01-17 DIAGNOSIS — D469 Myelodysplastic syndrome, unspecified: Secondary | ICD-10-CM | POA: Diagnosis not present

## 2016-01-17 DIAGNOSIS — R0602 Shortness of breath: Secondary | ICD-10-CM

## 2016-01-17 DIAGNOSIS — I110 Hypertensive heart disease with heart failure: Secondary | ICD-10-CM | POA: Diagnosis not present

## 2016-01-17 DIAGNOSIS — R079 Chest pain, unspecified: Secondary | ICD-10-CM | POA: Diagnosis present

## 2016-01-17 DIAGNOSIS — I4821 Permanent atrial fibrillation: Secondary | ICD-10-CM

## 2016-01-17 DIAGNOSIS — I272 Other secondary pulmonary hypertension: Secondary | ICD-10-CM | POA: Diagnosis present

## 2016-01-17 DIAGNOSIS — N184 Chronic kidney disease, stage 4 (severe): Secondary | ICD-10-CM

## 2016-01-17 DIAGNOSIS — Z881 Allergy status to other antibiotic agents status: Secondary | ICD-10-CM

## 2016-01-17 DIAGNOSIS — Z882 Allergy status to sulfonamides status: Secondary | ICD-10-CM

## 2016-01-17 DIAGNOSIS — I10 Essential (primary) hypertension: Secondary | ICD-10-CM | POA: Diagnosis present

## 2016-01-17 DIAGNOSIS — I350 Nonrheumatic aortic (valve) stenosis: Secondary | ICD-10-CM | POA: Diagnosis not present

## 2016-01-17 DIAGNOSIS — J9610 Chronic respiratory failure, unspecified whether with hypoxia or hypercapnia: Secondary | ICD-10-CM

## 2016-01-17 DIAGNOSIS — I481 Persistent atrial fibrillation: Secondary | ICD-10-CM | POA: Diagnosis not present

## 2016-01-17 DIAGNOSIS — Z880 Allergy status to penicillin: Secondary | ICD-10-CM

## 2016-01-17 DIAGNOSIS — R7989 Other specified abnormal findings of blood chemistry: Secondary | ICD-10-CM

## 2016-01-17 DIAGNOSIS — I501 Left ventricular failure: Secondary | ICD-10-CM | POA: Diagnosis not present

## 2016-01-17 DIAGNOSIS — Z7982 Long term (current) use of aspirin: Secondary | ICD-10-CM

## 2016-01-17 DIAGNOSIS — R778 Other specified abnormalities of plasma proteins: Secondary | ICD-10-CM

## 2016-01-17 DIAGNOSIS — I482 Chronic atrial fibrillation: Secondary | ICD-10-CM | POA: Diagnosis not present

## 2016-01-17 DIAGNOSIS — I5033 Acute on chronic diastolic (congestive) heart failure: Secondary | ICD-10-CM | POA: Diagnosis not present

## 2016-01-17 DIAGNOSIS — K8020 Calculus of gallbladder without cholecystitis without obstruction: Secondary | ICD-10-CM | POA: Diagnosis not present

## 2016-01-17 DIAGNOSIS — R1310 Dysphagia, unspecified: Secondary | ICD-10-CM | POA: Diagnosis not present

## 2016-01-17 DIAGNOSIS — F0390 Unspecified dementia without behavioral disturbance: Secondary | ICD-10-CM | POA: Diagnosis present

## 2016-01-17 DIAGNOSIS — R001 Bradycardia, unspecified: Secondary | ICD-10-CM

## 2016-01-17 DIAGNOSIS — Z6821 Body mass index (BMI) 21.0-21.9, adult: Secondary | ICD-10-CM

## 2016-01-17 DIAGNOSIS — I130 Hypertensive heart and chronic kidney disease with heart failure and stage 1 through stage 4 chronic kidney disease, or unspecified chronic kidney disease: Principal | ICD-10-CM | POA: Diagnosis present

## 2016-01-17 DIAGNOSIS — Z79899 Other long term (current) drug therapy: Secondary | ICD-10-CM

## 2016-01-17 DIAGNOSIS — W182XXA Fall in (into) shower or empty bathtub, initial encounter: Secondary | ICD-10-CM | POA: Diagnosis not present

## 2016-01-17 DIAGNOSIS — R4182 Altered mental status, unspecified: Secondary | ICD-10-CM | POA: Diagnosis present

## 2016-01-17 HISTORY — DX: Mild cognitive impairment of uncertain or unknown etiology: G31.84

## 2016-01-17 HISTORY — DX: Pulmonary hypertension, unspecified: I27.20

## 2016-01-17 HISTORY — DX: Permanent atrial fibrillation: I48.21

## 2016-01-17 HISTORY — DX: Chronic respiratory failure, unspecified whether with hypoxia or hypercapnia: J96.10

## 2016-01-17 HISTORY — DX: Chronic kidney disease, stage 4 (severe): N18.4

## 2016-01-17 HISTORY — DX: Chronic diastolic (congestive) heart failure: I50.32

## 2016-01-17 LAB — CBC WITH DIFFERENTIAL/PLATELET
Basophils Absolute: 0 10*3/uL (ref 0.0–0.1)
Basophils Relative: 1 %
EOS PCT: 7 %
Eosinophils Absolute: 0.4 10*3/uL (ref 0.0–0.7)
HCT: 35 % — ABNORMAL LOW (ref 36.0–46.0)
Hemoglobin: 11.2 g/dL — ABNORMAL LOW (ref 12.0–15.0)
LYMPHS ABS: 0.8 10*3/uL (ref 0.7–4.0)
LYMPHS PCT: 15 %
MCH: 31.1 pg (ref 26.0–34.0)
MCHC: 32 g/dL (ref 30.0–36.0)
MCV: 97.2 fL (ref 78.0–100.0)
MONO ABS: 0.6 10*3/uL (ref 0.1–1.0)
MONOS PCT: 10 %
Neutro Abs: 3.7 10*3/uL (ref 1.7–7.7)
Neutrophils Relative %: 67 %
PLATELETS: 219 10*3/uL (ref 150–400)
RBC: 3.6 MIL/uL — ABNORMAL LOW (ref 3.87–5.11)
RDW: 15.8 % — AB (ref 11.5–15.5)
WBC: 5.4 10*3/uL (ref 4.0–10.5)

## 2016-01-17 LAB — COMPREHENSIVE METABOLIC PANEL
ALT: 11 U/L — ABNORMAL LOW (ref 14–54)
AST: 18 U/L (ref 15–41)
Albumin: 4 g/dL (ref 3.5–5.0)
Alkaline Phosphatase: 71 U/L (ref 38–126)
Anion gap: 14 (ref 5–15)
BILIRUBIN TOTAL: 1.3 mg/dL — AB (ref 0.3–1.2)
BUN: 38 mg/dL — ABNORMAL HIGH (ref 6–20)
CHLORIDE: 104 mmol/L (ref 101–111)
CO2: 24 mmol/L (ref 22–32)
CREATININE: 1.9 mg/dL — AB (ref 0.44–1.00)
Calcium: 9.6 mg/dL (ref 8.9–10.3)
GFR, EST AFRICAN AMERICAN: 25 mL/min — AB (ref >60)
GFR, EST NON AFRICAN AMERICAN: 21 mL/min — AB (ref >60)
Glucose, Bld: 113 mg/dL — ABNORMAL HIGH (ref 65–99)
POTASSIUM: 4.6 mmol/L (ref 3.5–5.1)
Sodium: 142 mmol/L (ref 135–145)
TOTAL PROTEIN: 7.8 g/dL (ref 6.5–8.1)

## 2016-01-17 LAB — I-STAT CHEM 8, ED
BUN: 37 mg/dL — AB (ref 6–20)
CALCIUM ION: 1.18 mmol/L (ref 1.13–1.30)
CHLORIDE: 104 mmol/L (ref 101–111)
Creatinine, Ser: 1.8 mg/dL — ABNORMAL HIGH (ref 0.44–1.00)
Glucose, Bld: 113 mg/dL — ABNORMAL HIGH (ref 65–99)
HEMATOCRIT: 36 % (ref 36.0–46.0)
Hemoglobin: 12.2 g/dL (ref 12.0–15.0)
Potassium: 4.4 mmol/L (ref 3.5–5.1)
SODIUM: 141 mmol/L (ref 135–145)
TCO2: 25 mmol/L (ref 0–100)

## 2016-01-17 LAB — URINALYSIS, ROUTINE W REFLEX MICROSCOPIC
Bilirubin Urine: NEGATIVE
GLUCOSE, UA: NEGATIVE mg/dL
HGB URINE DIPSTICK: NEGATIVE
KETONES UR: NEGATIVE mg/dL
Leukocytes, UA: NEGATIVE
Nitrite: NEGATIVE
PH: 6 (ref 5.0–8.0)
PROTEIN: NEGATIVE mg/dL
Specific Gravity, Urine: 1.011 (ref 1.005–1.030)

## 2016-01-17 LAB — TROPONIN I
TROPONIN I: 0.21 ng/mL — AB (ref <0.031)
Troponin I: 0.04 ng/mL — ABNORMAL HIGH (ref <0.031)

## 2016-01-17 LAB — MRSA PCR SCREENING: MRSA BY PCR: NEGATIVE

## 2016-01-17 LAB — BRAIN NATRIURETIC PEPTIDE: B Natriuretic Peptide: 548 pg/mL — ABNORMAL HIGH (ref 0.0–100.0)

## 2016-01-17 MED ORDER — LEVOTHYROXINE SODIUM 112 MCG PO TABS
112.0000 ug | ORAL_TABLET | Freq: Every day | ORAL | Status: DC
  Administered 2016-01-19 – 2016-01-20 (×2): 112 ug via ORAL
  Filled 2016-01-17 (×2): qty 1

## 2016-01-17 MED ORDER — ONDANSETRON HCL 4 MG/2ML IJ SOLN: 4.0000 mg | Freq: Four times a day (QID) | INTRAMUSCULAR | Status: DC | PRN

## 2016-01-17 MED ORDER — METOPROLOL TARTRATE 12.5 MG HALF TABLET: 12.5000 mg | ORAL_TABLET | Freq: Two times a day (BID) | ORAL | Status: DC

## 2016-01-17 MED ORDER — FUROSEMIDE 10 MG/ML IJ SOLN
40.0000 mg | Freq: Once | INTRAMUSCULAR | Status: AC
  Administered 2016-01-17: 40 mg via INTRAVENOUS
  Filled 2016-01-17: qty 4

## 2016-01-17 MED ORDER — ENOXAPARIN SODIUM 30 MG/0.3ML ~~LOC~~ SOLN
30.0000 mg | SUBCUTANEOUS | Status: DC
  Administered 2016-01-17 – 2016-01-19 (×3): 30 mg via SUBCUTANEOUS
  Filled 2016-01-17 (×3): qty 0.3

## 2016-01-17 MED ORDER — SODIUM CHLORIDE 0.9 % IV SOLN
INTRAVENOUS | Status: DC
Start: 2016-01-17 — End: 2016-01-17
  Administered 2016-01-17: 14:00:00 via INTRAVENOUS

## 2016-01-17 MED ORDER — METOPROLOL TARTRATE 1 MG/ML IV SOLN: 2.5000 mg | Freq: Two times a day (BID) | INTRAVENOUS | Status: DC

## 2016-01-17 MED ORDER — ACETAMINOPHEN 325 MG PO TABS
650.0000 mg | ORAL_TABLET | ORAL | Status: DC | PRN
  Administered 2016-01-18: 650 mg via ORAL
  Filled 2016-01-17 (×2): qty 2

## 2016-01-17 MED ORDER — ACETAMINOPHEN 650 MG RE SUPP
650.0000 mg | Freq: Once | RECTAL | Status: AC
  Administered 2016-01-17: 650 mg via RECTAL
  Filled 2016-01-17: qty 1

## 2016-01-17 MED ORDER — HYDRALAZINE HCL 20 MG/ML IJ SOLN
5.0000 mg | Freq: Three times a day (TID) | INTRAMUSCULAR | Status: DC | PRN
Start: 2016-01-17 — End: 2016-01-20

## 2016-01-17 MED ORDER — ACETAMINOPHEN 325 MG PO TABS
650.0000 mg | ORAL_TABLET | Freq: Once | ORAL | Status: DC
  Filled 2016-01-17: qty 2

## 2016-01-17 MED ORDER — ACETAMINOPHEN 650 MG RE SUPP
650.0000 mg | Freq: Four times a day (QID) | RECTAL | Status: DC | PRN
  Administered 2016-01-18: 650 mg via RECTAL
  Filled 2016-01-17: qty 1

## 2016-01-17 MED ORDER — NITROGLYCERIN 0.4 MG SL SUBL: 0.4000 mg | SUBLINGUAL_TABLET | SUBLINGUAL | Status: DC | PRN

## 2016-01-17 MED ORDER — FUROSEMIDE 10 MG/ML IJ SOLN
40.0000 mg | Freq: Every day | INTRAMUSCULAR | Status: DC
  Filled 2016-01-17: qty 4

## 2016-01-17 MED ORDER — SODIUM CHLORIDE 0.9 % IV SOLN
INTRAVENOUS | Status: DC
  Filled 2016-01-17: qty 1000

## 2016-01-17 NOTE — Progress Notes (Signed)
EDCM spoke to patient and her daughter [name] [phone] at bedside.  Patient lives at home where she has 24/7 care with [employer], [employer].  Patient's daughter reports patient wears oxygen at home all through the night and PRN during the day supplied by [facility].  Patient has a walker at home.  Patient's pcp is Dr. Ree Kida of [place].  Patient to be transferred to [facility].  Patient's daughter reports not interested in home health at this time.  No further EDCM needs at this time.

## 2016-01-17 NOTE — ED Notes (Signed)
Report given to [facility] 

## 2016-01-17 NOTE — H&P (Deleted)
[facility] Admission History and Physical Service Pager: [phone]  Patient name: Gina Wilkinson Medical record number: [id number] Date of birth: 12-16-1919 Age: 82 y.o. Gender: female  Primary Care Provider: Lupita Dawn, MD Consultants: None Code Status: DNR  Chief Complaint: Altered Mental Status, Shortness of Breath, Left Rib Pain  Assessment and Plan: Gina Wilkinson is a 82 y.o. female presenting with altered mental status and left rib pain. PMH is significant for HFpEF, atrial fibrillation, pulmonary HTN, hypothyroidism.  # Altered Mental Status: Noted to be altered after receiving two doses of Baclofen (7:30pm on 2/20 and 3:00am on 2/21). Half life of 4hr, however Creatinine Clearance 17 and suspected to be extending adverse effects of medication. Appears dehydrated on exam, however overall fluid overload with elevated BNP and pulmonary edema noted below. CT head without acute abnormalities. Urinalysis normal. Vitals stable. - Place in Observation, Dr. Gwendlyn Wilkinson attending - Swallow evaluation due to altered status. Encourage PO fluids if passes. NPO pending evaluation. - Consider MRI. Will not obtain at this time given highly likely etiology of Baclofen adverse effect  - Holding fluids at this time per Cardiology. Will recheck BMP in am and if creatinine continuing to trend up will consider 250-500cc bolus of NS.  # HFpEF: BNP 548. CXR with pulmonary edema. CT abdomen with pulmonary edema. Somewhat complicated picture given dry mucous membranes and little PO intake, which is decreasing creatinine clearance and extending adverse effect of Baclofen.Cardiology suspects overall fluid overloaded and recommends continuing lasix and holding fluids. Lasix 40mg  IV given in ED with improvement of O2 saturation now noted. Appears to be below baseline weight. - Daily weights - Strict I&O - Echocardiogram - Cardiology contacted. Will hold fluids and gently diurese  per their recommendations. Will officially consult cardiology in the AM. - Lasix 40mg  IV daily  # Chest Pain: Troponin increased from 0.04 to 0.21 on repeat. - Continue to trend troponin - Discussed with Cardiology. They are not concerned at this time. Will continue to monitor troponin and recontact if needed. - EKG in am - Nitro PRN - Acetaminophen PRN pain  # Atrial Fibrillation: Currently prescribed Metoprolol. - Holding until able to take PO and bradycardia noted at admission (49). Hopeful to restart tomorrow. - Monitor HR. If elevates, consider IV Metoprolol.  # Hypertension: Elevated blood pressure to 169/70 noted. Holding home Metoprolol. - Hydralazine PRN - Monitor BP  # Hypothyroidism: Home medication includes Synthroid 154mcg - Recheck TSH - Continue home medication.  FEN/GI: NPO. Holding Fluids. Initiate diet once swallow screen obtained. Prophylaxis: Lovenox  Disposition: Home with [facility]  History of Present Illness:  Gina Wilkinson is a 82 y.o. female presenting with resolved altered mental status with chest pain and CHF.  Initially presented from home via EMS with altered mental status. Last seen normal at 3am on 01/16/16. History of fall in shower on 01/14/16 but denies head trauma. Patient prescribed Baclofen for muscle pain, which she took at 7:30pm on 2/20 and 3am this morning (2/21). When nurse came to home at 8am this morning, she could not wake patient up. Upon arrival to ED, mental status improved however Gina Wilkinson then complained of left sided chest pain. CXR did not show evidence of rib fracture, but did show signs of CHF. CT scan of abdomen without acute findings. Mild increase in troponin noted. IV lasix given in ED due to history of heart failure. Patient followed by [facility]: Lambert Rhonda W, so transferred to [facility] for further evaluation of heart failure and chest pain. Per family, she has been having increasing shortness of breath with  exertion. History obtained from family given drowsiness of Gina Wilkinson.  Receives 24/7 care by [employer] at home. Wears O2 during the night and as needed throughout the day. Ambulates with walker at home. Able to cook and keep her own checkbook, however home health helps with bathing and dressing.  Review Of Systems: Per HPI  Otherwise the remainder of the systems were negative.  Patient Active Problem List   Diagnosis Date Noted  . CHF (congestive heart failure) ([place]) 01/17/2016  . Preventative health care 06/06/2015  . Elevated serum creatinine 03/23/2015  . Anemia 07/27/2014  . Aortic stenosis 12/08/2013  . Essential hypertension 12/08/2013  . Pulmonary HTN ([place]) 12/08/2013  . Hip fracture ([place]) 11/09/2013  . Chronic diastolic heart failure ([place]) 11/09/2013  . RBBB 11/09/2013  . MDS (myelodysplastic syndrome) ([place]) 10/05/2011  . Hypothyroidism 02/15/2009  . ATRIAL FIBRILLATION  02/15/2009    Past Medical History: Past Medical History  Diagnosis Date  . MDS (myelodysplastic syndrome) ([place]) 10/05/2011  . Pneumonia   . Thyroid disease   . Hypertension   . Atrial fibrillation ([place]) dx Mar. 2010  . RBBB   . Pulmonary hypertension ([place])   . Chronic systolic heart failure ([place])   . Hypothyroidism   . Aortic stenosis   . Acute respiratory failure with hypoxia ([place])   . Anemia, unspecified 07/27/2014    Past Surgical History: Past Surgical History  Procedure Laterality Date  . Orif femur fracture  2001    left  . Appendectomy    . Orif periprosthetic fracture Left 11/12/2013    Procedure: OPEN REDUCTION INTERNAL FIXATION (ORIF) LEFT HIP PERIPROSTHETIC FRACTURE,;  Surgeon: Marianna Payment, MD;  Location: [facility]S;  Service: Orthopedics;  Laterality: Left;    Social History: Social History  Substance Use Topics  . Smoking status: Never Smoker   . Smokeless tobacco: Never Used  . Alcohol Use: No   Please also refer to relevant sections of EMR.  Family  History: No family history on file.  Allergies and Medications: Allergies  Allergen Reactions  . Levaquin [Levofloxacin] Anaphylaxis  . Penicillins Anaphylaxis  . Sulfonamide Derivatives Anaphylaxis   No current facility-administered medications on file prior to encounter.   Current Outpatient Prescriptions on File Prior to Encounter  Medication Sig Dispense Refill  . aspirin 81 MG tablet Take 81 mg by mouth daily.    . Cholecalciferol (VITAMIN D3) 3000 UNITS TABS Take 3,000 Units by mouth daily.     . ferrous sulfate 325 (65 FE) MG tablet Take 325 mg by mouth 2 (two) times a week. Mondays and Thursdays.    . furosemide (LASIX) 40 MG tablet TAKE 1/2 TABLET DAILY 30 tablet 0  . metoprolol tartrate (LOPRESSOR) 25 MG tablet TAKE 1/2 TABLET BY MOUTH TWICE A DAY 45 tablet 1  . SYNTHROID 112 MCG tablet TAKE 1 TABLET (112 MCG TOTAL) BY MOUTH DAILY. CANNOT BE GENERIC. MUST BE SYNTHROID. (WILL PAY 7/26) 30 tablet 2  . vitamin B-12 (CYANOCOBALAMIN) 250 MCG tablet Take 250 mcg by mouth daily.    . NON FORMULARY Oxygen 2 Liters every night and as needed      Objective: BP 169/70 mmHg  Pulse 72  Temp(Src) 97.6 F (36.4 C) (Oral)  Resp 16  Ht 5\' 6"  (1.676 m)  Wt 132 lb 8 oz (60.102 kg)  BMI 21.40 kg/m2  SpO2  97% Exam: General: Drowsy 82yo female resting comfortably in no apparent distress Eyes: PERRLA, opens eyes to stimulation ENTM: Dry mucous membranes Neck: Supple, no lyphadenopathy Cardiovascular: S1 and S2 noted, irregular rhythm, normal rate, murmur noted Respiratory: Clear to auscultation bilaterally, no crackles, no increased work of breathing Abdomen: Bowel sounds noted, soft and nondistended, nontender MSK: No edema noted, moves spontaneously Skin: Bruising noted on arms bilaterally, no bruising on side of rib pain Neuro: moves limbs spontaneously, muscles strength 5/5, neuro exam limited by drowsiness and reluctance to follow instructions Psych: Arousable but drowsy  Labs  and Imaging: CBC BMET   Recent Labs Lab 01/17/16 1351  WBC 5.4  HGB 11.2*  HCT 35.0*  PLT 219    Recent Labs Lab 01/17/16 1351  NA 142  K 4.6  CL 104  CO2 24  BUN 38*  CREATININE 1.90*  GLUCOSE 113*  CALCIUM 9.6    - BNP 548 - Troponin 0.04 > 0.21  Ct Abdomen Pelvis Wo Contrast  01/17/2016  CLINICAL DATA:  Altered level of consciousness. Diffuse pain. Fall in shower. Initial encounter. EXAM: CT ABDOMEN AND PELVIS WITHOUT CONTRAST TECHNIQUE: Multidetector CT imaging of the abdomen and pelvis was performed following the standard protocol without IV contrast. COMPARISON:  03/16/2009 FINDINGS: Lower chest and abdominal wall: Large sliding hiatal hernia with essentially intrathoracic stomach. Interlobular septal thickening with trace effusions. This correlates with CHF findings on prior chest x-ray. Hepatobiliary: Chronic calcified mass in the high right liver measuring 27 mm. The mass is benign given stability, question hyalinized fibroid.Small layering gallbladder calculi, newly seen. No obstructive or inflammatory changes Pancreas: Atrophy without ductal enlargement. Spleen: Unremarkable. Adrenals/Urinary Tract: Negative adrenals. Smooth renal atrophy, progressed. No hydronephrosis. Unremarkable bladder, as permitted by streak artifact from hip prosthesis Reproductive:Probable hysterectomy Stomach/Bowel: No inflammatory or obstructive changes. Extensive colonic diverticulosis distally. Large hiatal hernia. Vascular/Lymphatic: Diffuse atherosclerotic calcification. No acute vascular finding. No mass or adenopathy. Peritoneal: No ascites or pneumoperitoneum. Musculoskeletal: Motion new degradation at the level of the lower pelvis. No evidence of acute fracture. Total left hip arthroplasty without adverse findings are visualized. Medullary spaces are heterogeneous, similar to prior. This could be related to marked osteopenia or the patient's history of myelodysplastic syndrome. IMPRESSION: 1.  No acute or posttraumatic intra-abdominal finding. 2. Pulmonary edema and trace effusions. 3. Cholelithiasis and other chronic findings are described above. Electronically Signed   By: Monte Fantasia M.D.   On: 01/17/2016 14:41   Dg Chest 2 View  01/17/2016  CLINICAL DATA:  Head injury EXAM: CHEST  2 VIEW COMPARISON:  None. FINDINGS: Moderate cardiomegaly. Diffuse airspace and interstitial edema have worsened within the left lung and is stable in the right lung. Cavity at the right base is consistent with the patient's known large hiatal hernia. Small left pleural effusion is suspected. No pneumothorax. Osteopenia. IMPRESSION: CHF with pulmonary edema as described above. Hiatal hernia. Electronically Signed   By: Marybelle Killings M.D.   On: 01/17/2016 13:39   Lorna Few, DO 01/17/2016, 9:00 PM PGY-2, [facility] pager: [phone], text pages welcome

## 2016-01-17 NOTE — ED Notes (Addendum)
Per EMS, patient is from home.  Patient was last seen normal at 3:00 am 01-16-16, per CNA (home care provider).  Patient was complaining of pain but patient can't tell exactly location.  Patient slipped in shower on  01-14-16 in shower but was not treated.  Per family members, patient did not hit head or LOC.   Left arm has skin tear in addition to right leg.  Patient was prescribed pain by MD (Advil and muscle relaxer) Patient took muscle relaxer on 7:30 pm (2-20) and at 3 am this morning and went to sleep. Nurse came in at 8:00 am on 2-21, she could not arouse patient to wake up.  Patient is alert to voice.  Per family at bedside, patient is still a little drowsy  BP:176/89 R:30 O2: 95% on 4L,  Then 91% on 1 L P:67 CBG:135

## 2016-01-17 NOTE — ED Provider Notes (Signed)
CSN: [id number]     Arrival date & time 01/17/16  1220 History   First MD Initiated Contact with Patient 01/17/16 1258     Chief Complaint  Patient presents with  . Altered Mental Status     (Consider location/radiation/quality/duration/timing/severity/associated sxs/prior Treatment) HPI Comments: Patient here complaining of severe left-sided rib pain and left upper quadrant abdominal pain. Patient had a mechanical fall yesterday when she was helped to the ground by her aide. Since that time she has had severe pain characterized as sharp and worse with movement. Called her physician and was prescribed Advil and muscle relaxants. To the medications last night and slept for an extended period of time it was hard to arouse. She is now more alert. No reported either, vomiting. No other treatment used prior to arrival. Pain better with remaining still  Patient is a 82 y.o. female presenting with altered mental status. The history is provided by the patient and a relative.  Altered Mental Status   Past Medical History  Diagnosis Date  . MDS (myelodysplastic syndrome) ([place]) 10/05/2011  . Pneumonia   . Thyroid disease   . Hypertension   . Atrial fibrillation ([place]) dx Mar. 2010  . RBBB   . Pulmonary hypertension ([place])   . Chronic systolic heart failure ([place])   . Hypothyroidism   . Aortic stenosis   . Acute respiratory failure with hypoxia ([place])   . Anemia, unspecified 07/27/2014   Past Surgical History  Procedure Laterality Date  . Orif femur fracture  2001    left  . Appendectomy    . Orif periprosthetic fracture Left 11/12/2013    Procedure: OPEN REDUCTION INTERNAL FIXATION (ORIF) LEFT HIP PERIPROSTHETIC FRACTURE,;  Surgeon: Marianna Payment, MD;  Location: [facility];  Service: Orthopedics;  Laterality: Left;   No family history on file. Social History  Substance Use Topics  . Smoking status: Never Smoker   . Smokeless tobacco: Never Used  . Alcohol Use: No   OB History    No data  available     Review of Systems  All other systems reviewed and are negative.     Allergies  Levaquin; Penicillins; and Sulfonamide derivatives  Home Medications   Prior to Admission medications   Medication Sig Start Date End Date Taking? Authorizing Provider  aspirin 81 MG tablet Take 81 mg by mouth daily.   Yes Historical Provider, MD  baclofen (LIORESAL) 10 MG tablet Take 10 mg by mouth 3 (three) times daily as needed for muscle spasms.  01/16/16  Yes Historical Provider, MD  Cholecalciferol (VITAMIN D3) 3000 UNITS TABS Take 3,000 Units by mouth daily.    Yes Historical Provider, MD  clindamycin (CLEOCIN) 300 MG capsule Take 600 mg by mouth See admin instructions. Take 600mg  1 hour prior to appointment. 01/02/16  Yes Historical Provider, MD  ferrous sulfate 325 (65 FE) MG tablet Take 325 mg by mouth 2 (two) times a week. Mondays and Thursdays.   Yes Historical Provider, MD  furosemide (LASIX) 40 MG tablet TAKE 1/2 TABLET DAILY 01/16/16  Yes Evans Lance, MD  ibuprofen (ADVIL,MOTRIN) 200 MG tablet Take 200-600 mg by mouth every 6 (six) hours as needed.   Yes Historical Provider, MD  metoprolol tartrate (LOPRESSOR) 25 MG tablet TAKE 1/2 TABLET BY MOUTH TWICE A DAY 09/09/15  Yes Lupita Dawn, MD  SYNTHROID 112 MCG tablet TAKE 1 TABLET (112 MCG TOTAL) BY MOUTH DAILY. CANNOT BE GENERIC. MUST BE SYNTHROID. (WILL PAY 7/26) 09/28/15  Yes Lupita Dawn, MD  vitamin B-12 (CYANOCOBALAMIN) 250 MCG tablet Take 250 mcg by mouth daily.   Yes Historical Provider, MD  NON FORMULARY Oxygen 2 Liters every night and as needed    Historical Provider, MD   BP 184/91 mmHg  Pulse 71  Temp(Src) 98.2 F (36.8 C)  Resp 18  Ht 5\' 8"  (1.727 m)  Wt 61.236 kg  BMI 20.53 kg/m2  SpO2 90% Physical Exam  Constitutional: She is oriented to person, place, and time. She appears well-developed and well-nourished.  Non-toxic appearance. No distress.  HENT:  Head: Normocephalic and atraumatic.  Eyes: Conjunctivae,  EOM and lids are normal. Pupils are equal, round, and reactive to light.  Neck: Normal range of motion. Neck supple. No tracheal deviation present. No thyroid mass present.  Cardiovascular: Normal rate, regular rhythm and normal heart sounds.  Exam reveals no gallop.   No murmur heard. Pulmonary/Chest: Effort normal and breath sounds normal. No stridor. No respiratory distress. She has no decreased breath sounds. She has no wheezes. She has no rhonchi. She has no rales. She exhibits bony tenderness.    Abdominal: Soft. Normal appearance and bowel sounds are normal. She exhibits no distension. There is tenderness in the epigastric area and left upper quadrant. There is guarding. There is no rebound and no CVA tenderness.    Musculoskeletal: Normal range of motion. She exhibits no edema or tenderness.  Neurological: She is alert and oriented to person, place, and time. She has normal strength. No cranial nerve deficit or sensory deficit. GCS eye subscore is 4. GCS verbal subscore is 5. GCS motor subscore is 6.  Skin: Skin is warm and dry. No abrasion and no rash noted.  Psychiatric: Her speech is normal. Her mood appears anxious. She is withdrawn.  Nursing note and vitals reviewed.   ED Course  Procedures (including critical care time) Labs Review Labs Reviewed  URINE CULTURE  CBC WITH DIFFERENTIAL/PLATELET  URINALYSIS, ROUTINE W REFLEX MICROSCOPIC (NOT AT [facility])  COMPREHENSIVE METABOLIC PANEL  I-STAT CHEM 8, ED  TYPE AND SCREEN    Imaging Review No results found. I have personally reviewed and evaluated these images and lab results as part of my medical decision-making.   EKG Interpretation   Date/Time:  Tuesday January 17 2016 13:15:38 EST Ventricular Rate:  46 PR Interval:    QRS Duration: 168 QT Interval:  512 QTC Calculation: 448 R Axis:   124 Text Interpretation:  Atrial fibrillation Right bundle branch block  Lateral infarct, age indeterminate Probable anteroseptal  infarct, old  Confirmed by Donneisha Beane  MD, Mayley Lish ([id number]) on 01/17/2016 1:36:17 PM      MDM   Final diagnoses:  SOB (shortness of breath)    Patient had a chest x-ray of due to her history of trauma which did not show any evidence of rib fracture but did show CHF. Abdominal CT was performed due to patient's left upper quadrant pain and concern for splenic injury. CT scan showed no acute findings. She had a mild bump in her troponin as well as elevated BMP. Does have a history of heart failure past. Given Lasix 40 mg IV push. Discuss with family practice resident and patient be transferred over to [facility] to be admitted to telemetry. Patient is a DO NOT RESUSCITATE    Lacretia Leigh, MD 01/17/16 [phone]

## 2016-01-17 NOTE — ED Notes (Signed)
Report given to [place], Therapist, sports at [facility].  Call placed to [facility].  They said they would pick her up after 7pm

## 2016-01-18 ENCOUNTER — Encounter (HOSPITAL_COMMUNITY): Payer: Self-pay | Admitting: Physician Assistant

## 2016-01-18 ENCOUNTER — Observation Stay (HOSPITAL_COMMUNITY): Payer: Medicare Other

## 2016-01-18 DIAGNOSIS — I4821 Permanent atrial fibrillation: Secondary | ICD-10-CM

## 2016-01-18 DIAGNOSIS — R079 Chest pain, unspecified: Secondary | ICD-10-CM | POA: Diagnosis not present

## 2016-01-18 DIAGNOSIS — G9340 Encephalopathy, unspecified: Secondary | ICD-10-CM | POA: Diagnosis not present

## 2016-01-18 DIAGNOSIS — G92 Toxic encephalopathy: Secondary | ICD-10-CM | POA: Diagnosis not present

## 2016-01-18 DIAGNOSIS — R1012 Left upper quadrant pain: Secondary | ICD-10-CM | POA: Diagnosis not present

## 2016-01-18 DIAGNOSIS — I5033 Acute on chronic diastolic (congestive) heart failure: Secondary | ICD-10-CM | POA: Diagnosis not present

## 2016-01-18 DIAGNOSIS — I509 Heart failure, unspecified: Secondary | ICD-10-CM

## 2016-01-18 DIAGNOSIS — R778 Other specified abnormalities of plasma proteins: Secondary | ICD-10-CM

## 2016-01-18 DIAGNOSIS — I130 Hypertensive heart and chronic kidney disease with heart failure and stage 1 through stage 4 chronic kidney disease, or unspecified chronic kidney disease: Secondary | ICD-10-CM | POA: Diagnosis not present

## 2016-01-18 DIAGNOSIS — R4182 Altered mental status, unspecified: Secondary | ICD-10-CM | POA: Diagnosis not present

## 2016-01-18 DIAGNOSIS — N184 Chronic kidney disease, stage 4 (severe): Secondary | ICD-10-CM | POA: Diagnosis not present

## 2016-01-18 DIAGNOSIS — I10 Essential (primary) hypertension: Secondary | ICD-10-CM

## 2016-01-18 DIAGNOSIS — I4891 Unspecified atrial fibrillation: Secondary | ICD-10-CM | POA: Diagnosis not present

## 2016-01-18 DIAGNOSIS — D469 Myelodysplastic syndrome, unspecified: Secondary | ICD-10-CM | POA: Diagnosis not present

## 2016-01-18 DIAGNOSIS — I482 Chronic atrial fibrillation: Secondary | ICD-10-CM | POA: Diagnosis not present

## 2016-01-18 DIAGNOSIS — R7989 Other specified abnormal findings of blood chemistry: Secondary | ICD-10-CM

## 2016-01-18 DIAGNOSIS — R001 Bradycardia, unspecified: Secondary | ICD-10-CM

## 2016-01-18 DIAGNOSIS — R0781 Pleurodynia: Secondary | ICD-10-CM | POA: Diagnosis not present

## 2016-01-18 DIAGNOSIS — N179 Acute kidney failure, unspecified: Secondary | ICD-10-CM | POA: Diagnosis not present

## 2016-01-18 DIAGNOSIS — R109 Unspecified abdominal pain: Secondary | ICD-10-CM | POA: Diagnosis not present

## 2016-01-18 DIAGNOSIS — J9610 Chronic respiratory failure, unspecified whether with hypoxia or hypercapnia: Secondary | ICD-10-CM

## 2016-01-18 DIAGNOSIS — R64 Cachexia: Secondary | ICD-10-CM | POA: Diagnosis not present

## 2016-01-18 HISTORY — DX: Bradycardia, unspecified: R00.1

## 2016-01-18 LAB — COMPREHENSIVE METABOLIC PANEL
ALBUMIN: 3 g/dL — AB (ref 3.5–5.0)
ALT: 10 U/L — ABNORMAL LOW (ref 14–54)
AST: 15 U/L (ref 15–41)
Alkaline Phosphatase: 58 U/L (ref 38–126)
Anion gap: 13 (ref 5–15)
BUN: 30 mg/dL — ABNORMAL HIGH (ref 6–20)
CALCIUM: 9.2 mg/dL (ref 8.9–10.3)
CO2: 25 mmol/L (ref 22–32)
CREATININE: 1.77 mg/dL — AB (ref 0.44–1.00)
Chloride: 105 mmol/L (ref 101–111)
GFR calc Af Amer: 27 mL/min — ABNORMAL LOW (ref >60)
GFR, EST NON AFRICAN AMERICAN: 23 mL/min — AB (ref >60)
GLUCOSE: 97 mg/dL (ref 65–99)
POTASSIUM: 4.3 mmol/L (ref 3.5–5.1)
SODIUM: 143 mmol/L (ref 135–145)
TOTAL PROTEIN: 6.4 g/dL — AB (ref 6.5–8.1)
Total Bilirubin: 1.2 mg/dL (ref 0.3–1.2)

## 2016-01-18 LAB — TROPONIN I: TROPONIN I: 0.19 ng/mL — AB (ref <0.031)

## 2016-01-18 LAB — CBC
HCT: 28.9 % — ABNORMAL LOW (ref 36.0–46.0)
HEMOGLOBIN: 9.2 g/dL — AB (ref 12.0–15.0)
MCH: 30.6 pg (ref 26.0–34.0)
MCHC: 31.8 g/dL (ref 30.0–36.0)
MCV: 96 fL (ref 78.0–100.0)
PLATELETS: 173 10*3/uL (ref 150–400)
RBC: 3.01 MIL/uL — ABNORMAL LOW (ref 3.87–5.11)
RDW: 15.8 % — AB (ref 11.5–15.5)
WBC: 4.6 10*3/uL (ref 4.0–10.5)

## 2016-01-18 LAB — TSH: TSH: 0.083 u[IU]/mL — AB (ref 0.350–4.500)

## 2016-01-18 MED ORDER — SODIUM CHLORIDE 0.9 % IV SOLN
INTRAVENOUS | Status: AC
  Administered 2016-01-18: 1000 mL via INTRAVENOUS

## 2016-01-18 MED ORDER — SODIUM CHLORIDE 0.9 % IV SOLN
INTRAVENOUS | Status: DC
  Administered 2016-01-18: via INTRAVENOUS

## 2016-01-18 MED ORDER — ACETAMINOPHEN 160 MG/5ML PO SOLN
650.0000 mg | Freq: Four times a day (QID) | ORAL | Status: DC | PRN
  Administered 2016-01-18 – 2016-01-20 (×4): 650 mg via ORAL
  Filled 2016-01-18 (×4): qty 20.3

## 2016-01-18 MED ORDER — SODIUM CHLORIDE 0.9 % IV BOLUS (SEPSIS): 500.0000 mL | Freq: Once | INTRAVENOUS | Status: DC

## 2016-01-18 MED ORDER — AMLODIPINE BESYLATE 5 MG PO TABS
5.0000 mg | ORAL_TABLET | Freq: Every day | ORAL | Status: DC
  Administered 2016-01-18 – 2016-01-20 (×3): 5 mg via ORAL
  Filled 2016-01-18 (×3): qty 1

## 2016-01-18 MED ORDER — SODIUM CHLORIDE 0.9 % IV SOLN: INTRAVENOUS | Status: DC

## 2016-01-18 MED FILL — Fentanyl Citrate Preservative Free (PF) Inj 100 MCG/2ML: INTRAMUSCULAR | Qty: 2 | Status: AC

## 2016-01-18 NOTE — Progress Notes (Signed)
Message left for ST to call as MD wants to go ahead to have swallowing eval. Done.  Karie Kirks, Therapist, sports.

## 2016-01-18 NOTE — Progress Notes (Signed)
Left skin tear noted applied mepelex dsg

## 2016-01-18 NOTE — Progress Notes (Signed)
Family Medicine Teaching Service Daily Progress Note Intern Pager: [phone]  Patient name: Gina Wilkinson Medical record number: [id number] Date of birth: 1920-06-25 Age: 82 y.o. Gender: female  Primary Care Provider: Lupita Dawn, MD Consultants: None  Code Status: DNR  Pt Overview and Major Events to Date:   Assessment and Plan: Gina Wilkinson is a 82 y.o. female presenting with altered mental status and left rib pain. PMH is significant for HFpEF, atrial fibrillation, pulmonary HTN, hypothyroidism.  # Altered Mental Status: Patient continues to groan this morning. Not following commands. Not alert and orientated to self.  Noted to be altered after receiving two doses of Baclofen (7:30pm on 2/20 and 3:00am on 2/21). Half life of 4hr, however Creatinine Clearance 17 and suspected to be extending adverse effects of medication. Appears dehydrated on exam, however overall fluid overload with elevated BNP and pulmonary edema noted below. Urinalysis normal. Vitals stable - Head CT this AM, patient with hx of afib  - Swallow evaluation due to altered status. Encourage PO fluids if passes. NPO pending evaluation - Holding fluids at this time per Cardiology. Will recheck BMP in am and if creatinine continuing to trend up will consider 250-500cc bolus of NS.  # HFpEF: Euvolemic this AM. BNP 548. CXR with pulmonary edema. CT abdomen with pulmonary edema. Cardiology suspects overall fluid overloaded and recommends continuing lasix and holding fluids. Lasix 40mg  IV given in ED with improvement of O2 saturation now noted. Appears to be below baseline weight. - Daily weights 132 lbs> 134 lbs  - Strict I&O  - Echocardiogram Pending  - Cardiology recs this AM  - D/C lasix   #AKI: Scr 1.90> 1.77  (Baseline 1.3 )  - Will continue to monitor BMET  - Cr trending down  # Chest Pain:  Unable to obtain whether with chest pain this AM. Troponin increased from 0.04 > 0.21> 0.19. EKG with right bundle  branch block and afib> AM EKG, III and AVF with T-wave inversions  - Cardiology no further recs at this time  - Nitro PRN - Acetaminophen PRN pain  # Atrial Fibrillation: Currently prescribed Metoprolol, holding until able to take PO and bradycardia noted at admission. HR improved 70- 80 - Monitor HR. Continue holding Metoprolol   # Hypertension: Elevated blood pressure to 169/70> 175/77. Holding home Metoprolol. - Will add Norvasc 5 mg for blood pressure  - Hydralazine PRN - Monitor BP  # Hypothyroidism: Home medication includes Synthroid 178mcg - Recheck TSH - Continue home medication.  FEN/GI: NPO. Holding Fluids. Initiate diet once swallow screen obtained. Prophylaxis: Lovenox  Disposition: Med-surg   Subjective:  Patient continues to groan this morning. Not following commands. Not alert and orientated to self.  Unable to do review of systems on patient this AM including presence or absence of chest pain.   Objective: Temp:  [97.5 F (36.4 C)-98.2 F (36.8 C)] 98 F (36.7 C) (02/22 0602) Pulse Rate:  [49-88] 88 (02/22 0602) Resp:  [14-21] 20 (02/22 0602) BP: (157-202)/(57-125) 175/77 mmHg (02/22 0602) SpO2:  [90 %-97 %] 95 % (02/22 0602) Weight:  [132 lb 8 oz (60.102 kg)-135 lb (61.236 kg)] 134 lb 7 oz (60.98 kg) (02/22 0602) Physical Exam: General: NAD, lying in bed moaning, patient screams out when I touch her in general. Whether abdomen, chest, or legs.  Cardiovascular:  Bradycardiac, murmur,  Respiratory: unable to obtain accurately due to patient non-compliance ,CTAB otherwise Abdomen: BS+, ttp throughout  Extremities:  No pitting edema  Neuro: opens eyes to name, no response to commands,   Laboratory:  Recent Labs Lab 01/17/16 1331 01/17/16 1351 01/18/16 0550  WBC  --  5.4 4.6  HGB 12.2 11.2* 9.2*  HCT 36.0 35.0* 28.9*  PLT  --  219 173    Recent Labs Lab 01/17/16 1331 01/17/16 1351 01/18/16 0550  NA 141 142 143  K 4.4 4.6 4.3  CL 104 104 105   CO2  --  24 25  BUN 37* 38* 30*  CREATININE 1.80* 1.90* 1.77*  CALCIUM  --  9.6 9.2  PROT  --  7.8 6.4*  BILITOT  --  1.3* 1.2  ALKPHOS  --  71 58  ALT  --  11* 10*  AST  --  18 15  GLUCOSE 113* 113* 97     Imaging/Diagnostic Tests: Ct Abdomen Pelvis Wo Contrast  01/17/2016  CLINICAL DATA:  Altered level of consciousness. Diffuse pain. Fall in shower. Initial encounter. EXAM: CT ABDOMEN AND PELVIS WITHOUT CONTRAST TECHNIQUE: Multidetector CT imaging of the abdomen and pelvis was performed following the standard protocol without IV contrast. COMPARISON:  03/16/2009 FINDINGS: Lower chest and abdominal wall: Large sliding hiatal hernia with essentially intrathoracic stomach. Interlobular septal thickening with trace effusions. This correlates with CHF findings on prior chest x-ray. Hepatobiliary: Chronic calcified mass in the high right liver measuring 27 mm. The mass is benign given stability, question hyalinized fibroid.Small layering gallbladder calculi, newly seen. No obstructive or inflammatory changes Pancreas: Atrophy without ductal enlargement. Spleen: Unremarkable. Adrenals/Urinary Tract: Negative adrenals. Smooth renal atrophy, progressed. No hydronephrosis. Unremarkable bladder, as permitted by streak artifact from hip prosthesis Reproductive:Probable hysterectomy Stomach/Bowel: No inflammatory or obstructive changes. Extensive colonic diverticulosis distally. Large hiatal hernia. Vascular/Lymphatic: Diffuse atherosclerotic calcification. No acute vascular finding. No mass or adenopathy. Peritoneal: No ascites or pneumoperitoneum. Musculoskeletal: Motion new degradation at the level of the lower pelvis. No evidence of acute fracture. Total left hip arthroplasty without adverse findings are visualized. Medullary spaces are heterogeneous, similar to prior. This could be related to marked osteopenia or the patient's history of myelodysplastic syndrome. IMPRESSION: 1. No acute or posttraumatic  intra-abdominal finding. 2. Pulmonary edema and trace effusions. 3. Cholelithiasis and other chronic findings are described above. Electronically Signed   By: Monte Fantasia M.D.   On: 01/17/2016 14:41   Dg Chest 2 View  01/17/2016  CLINICAL DATA:  Head injury EXAM: CHEST  2 VIEW COMPARISON:  None. FINDINGS: Moderate cardiomegaly. Diffuse airspace and interstitial edema have worsened within the left lung and is stable in the right lung. Cavity at the right base is consistent with the patient's known large hiatal hernia. Small left pleural effusion is suspected. No pneumothorax. Osteopenia. IMPRESSION: CHF with pulmonary edema as described above. Hiatal hernia. Electronically Signed   By: Marybelle Killings M.D.   On: 01/17/2016 13:39    Donasia Wimes Cletis Media, MD 01/18/2016, 7:06 AM PGY-1, [place] Intern pager: [phone], text pages welcome

## 2016-01-18 NOTE — Progress Notes (Signed)
Initial Nutrition Assessment  INTERVENTION:  Provide Magic Cup BID with meals, each supplement provides 290 kcal and 9 grams of protein Provide Multivitamin with minerals daily   NUTRITION DIAGNOSIS:   Predicted suboptimal nutrient intake related to lethargy/confusion as evidenced by per patient/family report, energy intake < 75% for > or equal to 3 months.   GOAL:   Patient will meet greater than or equal to 90% of their needs   MONITOR:   PO intake, Supplement acceptance, Skin, Weight trends, Labs  REASON FOR ASSESSMENT:   Low Braden    ASSESSMENT:   82 y.o. female presenting with altered mental status and left rib pain. PMH is significant for HFpEF, atrial fibrillation, pulmonary HTN, hypothyroidism.  Patient in process of swallow evaluation with SLP at time of visit. Per family at bedside, patient never eats very much, but has been maintaining her weight between 136 and 140 lbs in the past year. Per daughter, pt loves ice cream.  Pt now on dysphagia 1 diet with thin liquids. Family agreeable to providing patient with Magic Cup ice cream.   Labs: low hemoglobin  Diet Order:  DIET - DYS 1 Room service appropriate?: Yes; Fluid consistency:: Thin  Skin:  Reviewed, no issues  Last BM:  PTA  Height:   Ht Readings from Last 1 Encounters:  01/17/16 5\' 6"  (1.676 m)    Weight:   Wt Readings from Last 1 Encounters:  01/18/16 134 lb 7 oz (60.98 kg)    Ideal Body Weight:  59.1 kg  BMI:  Body mass index is 21.71 kg/(m^2).  Estimated Nutritional Needs:   Kcal:  1300-1500  Protein:  70-80 grams  Fluid:  1.5 L/day  EDUCATION NEEDS:   No education needs identified at this time  [place], LDN Inpatient Clinical Dietitian Pager: [phone] After Hours Pager: [phone]

## 2016-01-18 NOTE — H&P (Signed)
FMTS ATTENDING ADMISSION NOTE Gina Eniola,MD I have seen and examined this patient, reviewed their chart. I have discussed this patient with the resident.   82 Y/O F wit pmx of HFrEF, Afib, HTN, myeloproliferative disorder,aortic stenosis, anemia, hypothyroidism, was brought in by EMS to [facility] long for change in mental status with excessive drowsiness after taking 2 doses of Baclofen. Per report at [facility] she later c/o left sided chest and abdominal pain for which she got an imaging study done. She was noted to be in CHF with pulmonary edema hence was given one dose of Lasix 40 mg IV and she was then transferred to our service. This morning, she opens her eyes when I called her name and she stated she is in pain but was unable to tell where she is having pain.  No current facility-administered medications on file prior to encounter.   Current Outpatient Prescriptions on File Prior to Encounter  Medication Sig Dispense Refill  . aspirin 81 MG tablet Take 81 mg by mouth daily.    . Cholecalciferol (VITAMIN D3) 3000 UNITS TABS Take 3,000 Units by mouth daily.     . ferrous sulfate 325 (65 FE) MG tablet Take 325 mg by mouth 2 (two) times a week. Mondays and Thursdays.    . furosemide (LASIX) 40 MG tablet TAKE 1/2 TABLET DAILY 30 tablet 0  . metoprolol tartrate (LOPRESSOR) 25 MG tablet TAKE 1/2 TABLET BY MOUTH TWICE A DAY 45 tablet 1  . SYNTHROID 112 MCG tablet TAKE 1 TABLET (112 MCG TOTAL) BY MOUTH DAILY. CANNOT BE GENERIC. MUST BE SYNTHROID. (WILL PAY 7/26) 30 tablet 2  . vitamin B-12 (CYANOCOBALAMIN) 250 MCG tablet Take 250 mcg by mouth daily.    . NON FORMULARY Oxygen 2 Liters every night and as needed     Past Medical History  Diagnosis Date  . MDS (myelodysplastic syndrome) ([place]) 10/05/2011  . Pneumonia   . Thyroid disease   . Hypertension   . Atrial fibrillation ([place]) dx Mar. 2010  . RBBB   . Pulmonary hypertension  ([place])   . Chronic systolic heart failure ([place])   . Hypothyroidism   . Aortic stenosis   . Acute respiratory failure with hypoxia ([place])   . Anemia, unspecified 07/27/2014   Ct Abdomen Pelvis Wo Contrast  01/17/2016 CLINICAL DATA: Altered level of consciousness. Diffuse pain. Fall in shower. Initial encounter. EXAM: CT ABDOMEN AND PELVIS WITHOUT CONTRAST TECHNIQUE: Multidetector CT imaging of the abdomen and pelvis was performed following the standard protocol without IV contrast. COMPARISON: 03/16/2009 FINDINGS: Lower chest and abdominal wall: Large sliding hiatal hernia with essentially intrathoracic stomach. Interlobular septal thickening with trace effusions. This correlates with CHF findings on prior chest x-ray. Hepatobiliary: Chronic calcified mass in the high right liver measuring 27 mm. The mass is benign given stability, question hyalinized fibroid.Small layering gallbladder calculi, newly seen. No obstructive or inflammatory changes Pancreas: Atrophy without ductal enlargement. Spleen: Unremarkable. Adrenals/Urinary Tract: Negative adrenals. Smooth renal atrophy, progressed. No hydronephrosis. Unremarkable bladder, as permitted by streak artifact from hip prosthesis Reproductive:Probable hysterectomy Stomach/Bowel: No inflammatory or obstructive changes. Extensive colonic diverticulosis distally. Large hiatal hernia. Vascular/Lymphatic: Diffuse atherosclerotic calcification. No acute vascular finding. No mass or adenopathy. Peritoneal: No ascites or pneumoperitoneum. Musculoskeletal: Motion new degradation at the level of the lower pelvis. No evidence of acute fracture. Total left hip arthroplasty without adverse findings are visualized. Medullary spaces are heterogeneous, similar to prior. This could be related to marked osteopenia or the patient's history of myelodysplastic  syndrome. IMPRESSION: 1. No acute or posttraumatic intra-abdominal finding. 2. Pulmonary edema and trace  effusions. 3. Cholelithiasis and other chronic findings are described above. Electronically Signed By: Monte Fantasia M.D. On: 01/17/2016 14:41   Dg Chest 2 View  01/17/2016 CLINICAL DATA: Head injury EXAM: CHEST 2 VIEW COMPARISON: None. FINDINGS: Moderate cardiomegaly. Diffuse airspace and interstitial edema have worsened within the left lung and is stable in the right lung. Cavity at the right base is consistent with the patient's known large hiatal hernia. Small left pleural effusion is suspected. No pneumothorax. Osteopenia. IMPRESSION: CHF with pulmonary edema as described above. Hiatal hernia. Electronically Signed By: Marybelle Killings M.D. On: 01/17/2016 13:39   Exam: Gen: Sleepy but arousable. HEENT: EOMI, PERRLA. No thyromegaly. Resp: Air entry equal and CTA B/L with mild basal rales. Heart: S1 S2 normal, 4/6 systolic murmur. Rhythm irregular, normal rate. Abd: Soft, mild to moderate suprapubic and left upper quadrant tenderness. No distension, BS+ and normal. Ext: No edema. Left forearm bruising, mild bruising on her right arm. Skin: Bruising on the skin of her arms b/l. Skin appears dry and winkled. Neuro: Sleepy but arousable. Unable to assess orientation. No facial asymmetry. Could not participate in muscle strength check. Normal reflex.    A/P:  1. Change in mental status: Likely medication induced.   Avoid sedative medications.   Neuro check qshift.   There is concern about Kidney function and Baclofen clearance per pharmacy, at the same time she has pulmonary edema and cardiology had recommended diuresis instead.   Recheck creatinine level and clearance this morning, it worsening might consider soft hydration or 250 cc NS bolus.     2. HFrEF/Chest pain: Last ECHO from 2014 reviewed.   S/P IV Lasix from [facility]. Cardiology curbsided consulted recommending continuing IV Lasix.   Troponin trending up, cardiology aware.    EKG reviewed with  no acute ischemic changes.    ?? Demand ischemia.   Repeat ECHO. Obtain risk stratification labs.   Monitor I&Os.   Baby ASA for cardioprotection.   Continue to trend troponin.   Cardiology to see this morning.   3. HTN/Bradycardia: Home Metoprolol held due to low HR.   HR holding up now. BP slightly elevated.   Hydralazine prn with parameters.   Monitor v/s.   4. Abdominal pain: Left upper quadrant and suprapubic tenderness.  CT abdomen reviewed and was negative for acute findings.  UA looks good hence UTI unlikely.  ?? Pain s/p fall at home although this will be an unusual presentation.  For now pain control with tylenol as needed.  5. Afib no acute change. Not on anticoagulant due to risk for fall.  Was on metoprolol at home but she was bradycardic.  We are holding metoprolol for now and she is currently rate controlled.  Cardiology to see today.  6. AKI on Chronic Kidney Disease: Baseline creatinine is around 1.6.  Creatinine on admission was 1.9, now trending down to 1.77  Monitor kidney function.  Avoid nephrotoxic agents.  Oral hydration as soon as she is able to tolerate it.  Signed off to Dr. Ardelia Mems this morning.          [facility] Admission History and Physical Service Pager: [phone]  Patient name: Gina PettyMedical record number: [id number] Date of birth: [birth year]Age: 82 y.o.Gender: female  Primary Care Provider: Lupita Dawn, MD Consultants: None Code Status: DNR  Chief Complaint: Altered Mental Status, Shortness of  Breath, Left Rib Pain  Assessment and Plan: Gina Wilkinson is a 82 y.o. female presenting with altered mental status and left rib pain. PMH is significant for HFpEF, atrial fibrillation, pulmonary HTN, hypothyroidism.  # Altered Mental Status: Noted to be  altered after receiving two doses of Baclofen (7:30pm on 2/20 and 3:00am on 2/21). Half life of 4hr, however Creatinine Clearance 17 and suspected to be extending adverse effects of medication. Appears dehydrated on exam, however overall fluid overload with elevated BNP and pulmonary edema noted below. Urinalysis normal. Vitals stable. - Place in Observation, Dr. Gwendlyn Deutscher attending - Swallow evaluation due to altered status. Encourage PO fluids if passes. NPO pending evaluation. - Consider head CT. Will not obtain at this time given highly likely etiology of Baclofen adverse effect and normal neuro exam when alert. - Holding fluids at this time per Cardiology. Will recheck BMP in am and if creatinine continuing to trend up will consider 250-500cc bolus of NS.  # HFpEF: BNP 548. CXR with pulmonary edema. CT abdomen with pulmonary edema. Somewhat complicated picture given dry mucous membranes and little PO intake, which is decreasing creatinine clearance and extending adverse effect of Baclofen.Cardiology suspects overall fluid overloaded and recommends continuing lasix and holding fluids. Lasix 40mg  IV given in ED with improvement of O2 saturation now noted. Appears to be below baseline weight. - Daily weights - Strict I&O - Echocardiogram - Cardiology contacted. Will hold fluids and gently diurese per their recommendations. Will officially consult cardiology in the AM. - Lasix 40mg  IV daily  # Chest Pain: Troponin increased from 0.04 to 0.21 on repeat. - Continue to trend troponin - Discussed with Cardiology. They are not concerned at this time. Will continue to monitor troponin and recontact if needed. - EKG in am - Nitro PRN - Acetaminophen PRN pain  # Atrial Fibrillation: Currently prescribed Metoprolol. - Holding until able to take PO and bradycardia noted at admission (49). Hopeful to restart tomorrow. - Monitor HR. If elevates, consider IV Metoprolol.  # Hypertension: Elevated blood  pressure to 169/70 noted. Holding home Metoprolol. - Hydralazine PRN - Monitor BP  # Hypothyroidism: Home medication includes Synthroid 162mcg - Recheck TSH - Continue home medication.  FEN/GI: NPO. Holding Fluids. Initiate diet once swallow screen obtained. Prophylaxis: Lovenox  Disposition: Home with [facility]  History of Present Illness: Gina Dupin is a 82 y.o. female presenting with resolved altered mental status with chest pain and CHF.  Initially presented from home via EMS with altered mental status. Last seen normal at 3am on 01/16/16. History of fall in shower on 01/14/16 but denies head trauma. Patient prescribed Baclofen for muscle pain, which she took at 7:30pm on 2/20 and 3am this morning (2/21). When nurse came to home at 8am this morning, she could not wake patient up. Upon arrival to ED, mental status improved however Mrs. Desa then complained of left sided chest pain. CXR did not show evidence of rib fracture, but did show signs of CHF. CT scan of abdomen without acute findings. Mild increase in troponin noted. IV lasix given in ED due to history of heart failure. Patient followed by [facility], so transferred to [facility] for further evaluation of heart failure and chest pain. Per family, she has been having increasing shortness of breath with exertion. History obtained from family given drowsiness of Mrs. Sheen.  Receives 24/7 care by [employer] at home. Wears O2 during the night and as needed throughout the day.  Ambulates with walker at home. Able to cook and keep her own checkbook, however home health helps with bathing and dressing.  Review Of Systems: Per HPI  Otherwise the remainder of the systems were negative.  Patient Active Problem List   Diagnosis Date Noted  . CHF (congestive heart failure) ([place]) 01/17/2016  . Preventative health care 06/06/2015  . Elevated serum creatinine 03/23/2015  . Anemia  07/27/2014  . Aortic stenosis 12/08/2013  . Essential hypertension 12/08/2013  . Pulmonary HTN ([place]) 12/08/2013  . Hip fracture ([place]) 11/09/2013  . Chronic diastolic heart failure ([place]) 11/09/2013  . RBBB 11/09/2013  . MDS (myelodysplastic syndrome) ([place]) 10/05/2011  . Hypothyroidism 02/15/2009  . ATRIAL FIBRILLATION  02/15/2009    Past Medical History: Past Medical History  Diagnosis Date  . MDS (myelodysplastic syndrome) ([place]) 10/05/2011  . Pneumonia   . Thyroid disease   . Hypertension   . Atrial fibrillation ([place]) dx Mar. 2010  . RBBB   . Pulmonary hypertension ([place])   . Chronic systolic heart failure ([place])   . Hypothyroidism   . Aortic stenosis   . Acute respiratory failure with hypoxia ([place])   . Anemia, unspecified 07/27/2014    Past Surgical History: Past Surgical History  Procedure Laterality Date  . Orif femur fracture  2001    left  . Appendectomy    . Orif periprosthetic fracture Left 11/12/2013    Procedure: OPEN REDUCTION INTERNAL FIXATION (ORIF) LEFT HIP PERIPROSTHETIC FRACTURE,; Surgeon: Marianna Payment, MD; Location: [facility] ORS; Service: Orthopedics; Laterality: Left;    Social History: Social History  Substance Use Topics  . Smoking status: Never Smoker   . Smokeless tobacco: Never Used  . Alcohol Use: No   Please also refer to relevant sections of EMR.  Family History: No family history on file.  Allergies and Medications: Allergies  Allergen Reactions  . Levaquin [Levofloxacin] Anaphylaxis  . Penicillins Anaphylaxis  . Sulfonamide Derivatives Anaphylaxis   No current facility-administered medications on file prior to encounter.   Current Outpatient Prescriptions on File Prior to Encounter  Medication Sig Dispense Refill  . aspirin 81 MG tablet Take 81 mg by mouth daily.    . Cholecalciferol (VITAMIN D3) 3000  UNITS TABS Take 3,000 Units by mouth daily.     . ferrous sulfate 325 (65 FE) MG tablet Take 325 mg by mouth 2 (two) times a week. Mondays and Thursdays.    . furosemide (LASIX) 40 MG tablet TAKE 1/2 TABLET DAILY 30 tablet 0  . metoprolol tartrate (LOPRESSOR) 25 MG tablet TAKE 1/2 TABLET BY MOUTH TWICE A DAY 45 tablet 1  . SYNTHROID 112 MCG tablet TAKE 1 TABLET (112 MCG TOTAL) BY MOUTH DAILY. CANNOT BE GENERIC. MUST BE SYNTHROID. (WILL PAY 7/26) 30 tablet 2  . vitamin B-12 (CYANOCOBALAMIN) 250 MCG tablet Take 250 mcg by mouth daily.    . NON FORMULARY Oxygen 2 Liters every night and as needed      Objective: BP 169/70 mmHg  Pulse 72  Temp(Src) 97.6 F (36.4 C) (Oral)  Resp 16  Ht 5\' 6"  (1.676 m)  Wt 132 lb 8 oz (60.102 kg)  BMI 21.40 kg/m2  SpO2 97% Exam: General: Drowsy 82yo female resting comfortably in no apparent distress Eyes: PERRLA, opens eyes to stimulation ENTM: Dry mucous membranes Neck: Supple, no lyphadenopathy Cardiovascular: S1 and S2 noted, irregular rhythm, normal rate, murmur noted Respiratory: Clear to auscultation bilaterally, no crackles, no increased work of breathing Abdomen: Bowel sounds noted, soft and  nondistended, nontender MSK: No edema noted, moves spontaneously Skin: Bruising noted on arms bilaterally, no bruising on side of rib pain Neuro: moves limbs spontaneously, muscles strength 5/5, neuro exam limited by drowsiness and reluctance to follow instructions Psych: Arousable but drowsy  Labs and Imaging: CBC BMET   Last Labs      Recent Labs Lab 01/17/16 1351  WBC 5.4  HGB 11.2*  HCT 35.0*  PLT 219      Last Labs      Recent Labs Lab 01/17/16 1351  NA 142  K 4.6  CL 104  CO2 24  BUN 38*  CREATININE 1.90*  GLUCOSE 113*  CALCIUM 9.6      - BNP 548 - Troponin 0.04 > 0.21   Imaging Results    Ct Abdomen Pelvis Wo Contrast  01/17/2016 CLINICAL DATA: Altered level  of consciousness. Diffuse pain. Fall in shower. Initial encounter. EXAM: CT ABDOMEN AND PELVIS WITHOUT CONTRAST TECHNIQUE: Multidetector CT imaging of the abdomen and pelvis was performed following the standard protocol without IV contrast. COMPARISON: 03/16/2009 FINDINGS: Lower chest and abdominal wall: Large sliding hiatal hernia with essentially intrathoracic stomach. Interlobular septal thickening with trace effusions. This correlates with CHF findings on prior chest x-ray. Hepatobiliary: Chronic calcified mass in the high right liver measuring 27 mm. The mass is benign given stability, question hyalinized fibroid.Small layering gallbladder calculi, newly seen. No obstructive or inflammatory changes Pancreas: Atrophy without ductal enlargement. Spleen: Unremarkable. Adrenals/Urinary Tract: Negative adrenals. Smooth renal atrophy, progressed. No hydronephrosis. Unremarkable bladder, as permitted by streak artifact from hip prosthesis Reproductive:Probable hysterectomy Stomach/Bowel: No inflammatory or obstructive changes. Extensive colonic diverticulosis distally. Large hiatal hernia. Vascular/Lymphatic: Diffuse atherosclerotic calcification. No acute vascular finding. No mass or adenopathy. Peritoneal: No ascites or pneumoperitoneum. Musculoskeletal: Motion new degradation at the level of the lower pelvis. No evidence of acute fracture. Total left hip arthroplasty without adverse findings are visualized. Medullary spaces are heterogeneous, similar to prior. This could be related to marked osteopenia or the patient's history of myelodysplastic syndrome. IMPRESSION: 1. No acute or posttraumatic intra-abdominal finding. 2. Pulmonary edema and trace effusions. 3. Cholelithiasis and other chronic findings are described above. Electronically Signed By: Monte Fantasia M.D. On: 01/17/2016 14:41   Dg Chest 2 View  01/17/2016 CLINICAL DATA: Head injury EXAM: CHEST 2 VIEW COMPARISON: None. FINDINGS: Moderate  cardiomegaly. Diffuse airspace and interstitial edema have worsened within the left lung and is stable in the right lung. Cavity at the right base is consistent with the patient's known large hiatal hernia. Small left pleural effusion is suspected. No pneumothorax. Osteopenia. IMPRESSION: CHF with pulmonary edema as described above. Hiatal hernia. Electronically Signed By: Marybelle Killings M.D. On: 01/17/2016 13:39    Lorna Few, DO 01/17/2016, 9:00 PM PGY-2, [place] pager: [phone], text pages welcome

## 2016-01-18 NOTE — Care Management Obs Status (Signed)
[place] NOTIFICATION   Patient Details  Name: Gina Wilkinson MRN: [id number] Date of Birth: 04-Jun-1920   Medicare Observation Status Notification Given:  Yes    Royston Bake, RN   * Patient daughter is very upset, refusing to sign, stating " it needs to switched over now." CM informed pt/ daughters that patient needs to meet criteria for inpatient status before it can be changed to Inpatient.  01/18/2016, 11:30 AM

## 2016-01-18 NOTE — Consult Note (Signed)
Cardiology Consultation Note    Patient ID: Gina Wilkinson, MRN: [id number], DOB/AGE: [birth year] 28 y.o. Admit date: 01/17/2016   Date of Consult: 01/18/2016 Primary Physician: Lupita Dawn, MD Primary Cardiologist: Dr. Lovena Le  Chief Complaint: altered mental status Reason for Consultation: elevated troponin Requesting MD: Dr. Mikell/Fletke  HPI: Gina Wilkinson is a 82 y/o F with history of myelodysplastic syndrome, mild cognitive impairment, permanent atrial fib (not on anticoagulation due to MDS), HTN, hypothyroidism, mod-severe pulm HTN in 2014, chronic respiratory failure (on home O2 qhs and PRN), CKD stage IV (per review of labs - not formally called in chart, but baseline Cr appears 1.6-1.7) and chronic diastolic CHF who presented initially to [facility] with altered mental status then transferred to [facility] for further evaluation. The patient is unable to provide any meaningful history as she just babbles or groans, thus history is obtained by talking to her 2 daughters and caregiver. They report at baseline she is quite functional and aware of her surroundings (knows where she is, who people are, has frequent company visit) but does struggle with short-term memory issues. As a result they have 24-hour aides to look in on her. Her daughter describes her as "speedy gonzales" with her walker at baseline. Apparently on Saturday 01/14/16 she had a near-fall in the shower where she twisted backwards but did not fully fall or strike any body parts. She complained of persistent back pain thereafter. Her PCP prescribed Advil + Baclofen. She got her first dose 2/20 and second dose 8 hours later in the early morning hours of 01/17/16. When the morning aide came to check on her, she was nearly non-responsive - very lethargic and not answering questions - but subsequently became very agitated. Workup revealed CXR suggestive of CHF/pulm edema, CT abd/pelvis with no acute intra-abdominal finding (+pulm edema, trace  effusions, cholelithiasis), and CT head without acute finding. Labs notable for BUN/Cr 38/1.9 (improved to 1.7 after IV Lasix + IV fluids), Hgb 11.2->9.2, BNP 548, troponin 0.04-0.21-0.19. She was apparently hypoxic in the ER on arrival which improved with a dose of IV Lasix. She was also placed on IV fluids but these were discontinued. IV lasix was ordered then discontinued this AM. Weight is down 1 lb from 09/2015 and 6lb from this summer. BP appears persistently elevated this admission ([id number] systolic). Her metoprolol was discontinued due to bradycardia on telemetry (HR generally running upper 40s-80s; transient dips into upper 30s during sleeping hours). The patient was apparently quite agitated earlier, screaming upon being touched and groaning constantly. Presently she is resting quietly and opens eyes to the sound of voice. She seems to respond to some questions but other times talks nonsensically then goes back to closing her eyes. Her family denies any recent complaints of CP or SOB. They have not noticed any increased work of breathing with her ambulation lately. She's not had any syncope.   Past Medical History  Diagnosis Date  . MDS (myelodysplastic syndrome) ([place]) 10/05/2011  . Pneumonia   . Hypertension   . Permanent atrial fibrillation ([place]) dx Mar. 2010    a. Not on anticoag due to myelodysplastic syndrome.  Marland Kitchen RBBB   . Pulmonary hypertension ([place])   . Chronic diastolic CHF (congestive heart failure) ([place])   . Hypothyroidism   . Aortic stenosis   . Acute respiratory failure with hypoxia ([place])   . Anemia, unspecified 07/27/2014  . Mild cognitive impairment   . Moderate to severe pulmonary hypertension ([place])   . Chronic  respiratory failure (HCC)     a. on home O2 qhs and PRN.  . CKD (chronic kidney disease), stage IV ([place])     a. per review of historical labs.      Surgical History:  Past Surgical History  Procedure Laterality Date  . Orif femur fracture  2001    left  .  Appendectomy    . Orif periprosthetic fracture Left 11/12/2013    Procedure: OPEN REDUCTION INTERNAL FIXATION (ORIF) LEFT HIP PERIPROSTHETIC FRACTURE,;  Surgeon: Marianna Payment, MD;  Location: [facility];  Service: Orthopedics;  Laterality: Left;     Home Meds: Prior to Admission medications   Medication Sig Start Date End Date Taking? Authorizing Provider  aspirin 81 MG tablet Take 81 mg by mouth daily.   Yes Historical Provider, MD  baclofen (LIORESAL) 10 MG tablet Take 10 mg by mouth 3 (three) times daily as needed for muscle spasms.  01/16/16  Yes Historical Provider, MD  Cholecalciferol (VITAMIN D3) 3000 UNITS TABS Take 3,000 Units by mouth daily.    Yes Historical Provider, MD  clindamycin (CLEOCIN) 300 MG capsule Take 600 mg by mouth See admin instructions. Take 600mg  1 hour prior to appointment. 01/02/16  Yes Historical Provider, MD  ferrous sulfate 325 (65 FE) MG tablet Take 325 mg by mouth 2 (two) times a week. Mondays and Thursdays.   Yes Historical Provider, MD  furosemide (LASIX) 40 MG tablet TAKE 1/2 TABLET DAILY 01/16/16  Yes Evans Lance, MD  ibuprofen (ADVIL,MOTRIN) 200 MG tablet Take 200-600 mg by mouth every 6 (six) hours as needed.   Yes Historical Provider, MD  metoprolol tartrate (LOPRESSOR) 25 MG tablet TAKE 1/2 TABLET BY MOUTH TWICE A DAY 09/09/15  Yes Lupita Dawn, MD  SYNTHROID 112 MCG tablet TAKE 1 TABLET (112 MCG TOTAL) BY MOUTH DAILY. CANNOT BE GENERIC. MUST BE SYNTHROID. (WILL PAY 7/26) 09/28/15  Yes Lupita Dawn, MD  vitamin B-12 (CYANOCOBALAMIN) 250 MCG tablet Take 250 mcg by mouth daily.   Yes Historical Provider, MD  NON FORMULARY Oxygen 2 Liters every night and as needed    Historical Provider, MD    Inpatient Medications:  . enoxaparin (LOVENOX) injection  30 mg Subcutaneous Q24H  . levothyroxine  112 mcg Oral QAC breakfast      Allergies:  Allergies  Allergen Reactions  . Levaquin [Levofloxacin] Anaphylaxis  . Penicillins Anaphylaxis  . Sulfonamide  Derivatives Anaphylaxis    Social History   Social History  . Marital Status: Widowed    Spouse Name: N/A  . Number of Children: N/A  . Years of Education: N/A   Occupational History  . Not on file.   Social History Main Topics  . Smoking status: Never Smoker   . Smokeless tobacco: Never Used  . Alcohol Use: No  . Drug Use: No  . Sexual Activity: Not on file   Other Topics Concern  . Not on file   Social History Narrative     Family history: unable to obtain due to obtundation, but prior notes indicate mother died age 84-60s of ruptured appendix, and no CAD in father or siblings  Review of Systems: unable to obtain due to obtundation   Labs:  Recent Labs  01/17/16 1423 01/17/16 2210 01/18/16 0550  TROPONINI 0.04* 0.21* 0.19*   Lab Results  Component Value Date   WBC 4.6 01/18/2016   HGB 9.2* 01/18/2016   HCT 28.9* 01/18/2016   MCV 96.0 01/18/2016   PLT 173  01/18/2016    Recent Labs Lab 01/18/16 0550  NA 143  K 4.3  CL 105  CO2 25  BUN 30*  CREATININE 1.77*  CALCIUM 9.2  PROT 6.4*  BILITOT 1.2  ALKPHOS 58  ALT 10*  AST 15  GLUCOSE 97   Radiology/Studies:  Ct Abdomen Pelvis Wo Contrast  01/17/2016  CLINICAL DATA:  Altered level of consciousness. Diffuse pain. Fall in shower. Initial encounter. EXAM: CT ABDOMEN AND PELVIS WITHOUT CONTRAST TECHNIQUE: Multidetector CT imaging of the abdomen and pelvis was performed following the standard protocol without IV contrast. COMPARISON:  03/16/2009 FINDINGS: Lower chest and abdominal wall: Large sliding hiatal hernia with essentially intrathoracic stomach. Interlobular septal thickening with trace effusions. This correlates with CHF findings on prior chest x-ray. Hepatobiliary: Chronic calcified mass in the high right liver measuring 27 mm. The mass is benign given stability, question hyalinized fibroid.Small layering gallbladder calculi, newly seen. No obstructive or inflammatory changes Pancreas: Atrophy  without ductal enlargement. Spleen: Unremarkable. Adrenals/Urinary Tract: Negative adrenals. Smooth renal atrophy, progressed. No hydronephrosis. Unremarkable bladder, as permitted by streak artifact from hip prosthesis Reproductive:Probable hysterectomy Stomach/Bowel: No inflammatory or obstructive changes. Extensive colonic diverticulosis distally. Large hiatal hernia. Vascular/Lymphatic: Diffuse atherosclerotic calcification. No acute vascular finding. No mass or adenopathy. Peritoneal: No ascites or pneumoperitoneum. Musculoskeletal: Motion new degradation at the level of the lower pelvis. No evidence of acute fracture. Total left hip arthroplasty without adverse findings are visualized. Medullary spaces are heterogeneous, similar to prior. This could be related to marked osteopenia or the patient's history of myelodysplastic syndrome. IMPRESSION: 1. No acute or posttraumatic intra-abdominal finding. 2. Pulmonary edema and trace effusions. 3. Cholelithiasis and other chronic findings are described above. Electronically Signed   By: Monte Fantasia M.D.   On: 01/17/2016 14:41   Dg Chest 2 View  01/17/2016  CLINICAL DATA:  Head injury EXAM: CHEST  2 VIEW COMPARISON:  None. FINDINGS: Moderate cardiomegaly. Diffuse airspace and interstitial edema have worsened within the left lung and is stable in the right lung. Cavity at the right base is consistent with the patient's known large hiatal hernia. Small left pleural effusion is suspected. No pneumothorax. Osteopenia. IMPRESSION: CHF with pulmonary edema as described above. Hiatal hernia. Electronically Signed   By: Marybelle Killings M.D.   On: 01/17/2016 13:39   Ct Head Wo Contrast  01/18/2016  CLINICAL DATA:  Altered mental status.  Agitation. EXAM: CT HEAD WITHOUT CONTRAST TECHNIQUE: Contiguous axial images were obtained from the base of the skull through the vertex without intravenous contrast. COMPARISON:  09/06/2012 FINDINGS: The brain shows generalized  atrophy. There chronic small-vessel ischemic changes affecting the cerebral hemispheric white matter. No sign of acute infarction, mass lesion, hemorrhage, hydrocephalus or extra-axial collection. The calvarium is unremarkable. Sinuses are clear. There is atherosclerotic calcification of the major vessels at the base of the brain. IMPRESSION: No acute finding by CT. Atrophy and chronic small-vessel disease of the cerebral hemispheric white matter. Electronically Signed   By: Nelson Chimes M.D.   On: 01/18/2016 10:13    Wt Readings from Last 3 Encounters:  01/18/16 134 lb 7 oz (60.98 kg)  10/07/15 135 lb 12.8 oz (61.598 kg)  06/06/15 140 lb 1.6 oz (63.549 kg)   EKG: 01/17/15: atrial fib 59bpm slow ventricular response, RBBB with nonspecific ST-T changes in setting of RBBB  Physical Exam: Blood pressure 175/77, pulse 88, temperature 98 F (36.7 C), temperature source Oral, resp. rate 20, height 5\' 6"  (1.676 m), weight 134 lb 7  oz (60.98 kg), SpO2 95 %. Body mass index is 21.71 kg/(m^2). General: Frail elderly WF in no acute distress. Resting comfortably and quietly in bed Head: Normocephalic, atraumatic, sclera non-icteric, no xanthomas, nares are without discharge.  Neck: Negative for carotid bruits. JVD not elevated. Lungs: Clear bilaterally anteriorly without wheezes, rales or rhonchi. Patient will not really cooperate with breathing instructions. Breathing is unlabored. Heart: Irregularly irregular, rate controlled with S1 S2. No murmurs, rubs, or gallops appreciated. Abdomen: Soft, non-tender, non-distended with normoactive bowel sounds. No hepatomegaly. No rebound/guarding. No obvious abdominal masses. Msk:  Strength and tone appear normal for age. Extremities: No clubbing or cyanosis. No edema.  Distal pedal pulses are 2+ and equal bilaterally. Neuro: Awakens spontaneously to sound of voice, but does not reliably answer questions as above Psych: when awake she seems pleasant and unagitated.       Assessment and Plan  82 y/o F with history of myelodysplastic syndrome, mild cognitive impairment, permanent atrial fib (not on anticoagulation due to MDS), HTN, hypothyroidism, mod-severe pulm HTN in 2014, mild-mod AS by echo in 2014, chronic respiratory failure (on home O2 qhs and PRN), CKD stage IV (per review of labs - not formally called in chart, but baseline Cr appears 1.6-1.7) and chronic diastolic CHF. She presented to [facility] upon transfer from [facility] for AMS of unclear etiology after taking baclofen for a "twisting" injury in the shower without formal fall. Cardiology asked to see for elevated troponin and h/o CHF.  1. Altered mental status/acute encephalopathy - etiology unclear. It was suspected possibly due to baclofen on admission but her AMS has persisted. Further workup per IM. Consider neurology evaluation.   2. Elevated troponin - does not appear clinically relevant at present time. She is not complaining of chest pain or dyspnea. She is not currently a candidate for further ischemic workup with AMS of unclear etiology, renal disease, advanced age, and lack of symptoms. Would resume ASA when OK with primary team.  3. Permanent atrial fibrillation with slow ventricular response - agree with cessation of metoprolol.   4. Chronic diastolic CHF - ?CHF on admission which seems to have resolved as she appears euvolemic on exam. Agree with holding diuretic. May consider gentle hydration while she is not eating/drinking. (Swallow screen pending.) Follow daily weights and I/O's. Primary team has ordered echocardiogram. Not sure this would change management at present time, but can f/u result.  5. HTN - family feels this is related to patient's complaint of flank pain from twisting in the shower the other day. Can consider addition of amlodipine in lieu of the BB that was stopped.  Signed, Charlie Pitter PA-C 01/18/2016, 11:28 AM Pager: [phone]  I have personally seen and examined this  patient with Melina Copa, PA-C. I agree with the assessment and plan as outlined above. Ms. Edlin has known persistent atrial fib on no anti-coagulation, followed by DR. Lovena Le in [place]. She also has moderate AS and dementia. She is not a candidate for invasive cardiac tests. She is admitted with AMS after starting Baclofen. This is now improving. Troponin with very subtle elevation. This is not relevant to her presentation. She has no chest pain or SOB. I conducted a full physical examination. I personally reviewed her labs and EKG. I would agree with conservative approach. Echo is pending today. Discussed with pt and daughters. We will follow.   MCALHANY,CHRISTOPHER 01/18/2016 12:22 PM

## 2016-01-18 NOTE — Progress Notes (Signed)
Unable to complete nsg history. Pt is confused.

## 2016-01-18 NOTE — Evaluation (Signed)
Clinical/Bedside Swallow Evaluation Patient Details  Name: Gina Wilkinson MRN: [id number] Date of Birth: 08-24-1920  Today's Date: 01/18/2016 Time: SLP Start Time (ACUTE ONLY): 1335 SLP Stop Time (ACUTE ONLY): 1400 SLP Time Calculation (min) (ACUTE ONLY): 25 min  Past Medical History:  Past Medical History  Diagnosis Date  . MDS (myelodysplastic syndrome) ([place]) 10/05/2011  . Pneumonia   . Hypertension   . Permanent atrial fibrillation ([place]) dx Mar. 2010    a. Not on anticoag due to myelodysplastic syndrome.  Marland Kitchen RBBB   . Pulmonary hypertension ([place])   . Chronic diastolic CHF (congestive heart failure) ([place])   . Hypothyroidism   . Aortic stenosis   . Acute respiratory failure with hypoxia ([place])   . Anemia, unspecified 07/27/2014  . Mild cognitive impairment   . Moderate to severe pulmonary hypertension ([place])   . Chronic respiratory failure (HCC)     a. on home O2 qhs and PRN.  . CKD (chronic kidney disease), stage IV ([place])     a. per review of historical labs.   Past Surgical History:  Past Surgical History  Procedure Laterality Date  . Orif femur fracture  2001    left  . Appendectomy    . Orif periprosthetic fracture Left 11/12/2013    Procedure: OPEN REDUCTION INTERNAL FIXATION (ORIF) LEFT HIP PERIPROSTHETIC FRACTURE,;  Surgeon: Marianna Payment, MD;  Location: [facility];  Service: Orthopedics;  Laterality: Left;   HPI:  82 y.o. female with h/o HFpEF, atrial fibrillation, pulmonary HTN, hypothyroidism and pneumonia, presented to ED with altered mental status and L rib pain. CXR 2/21 diffuse airspace and interstitial edema have worsened within the L lung and stable in R lung. Small L pleural effusion is suspected. No known h/o SLP intervention. Daughter reported pt "coughed during meals for years but has gotten better recently.'    Assessment / Plan / Recommendation Clinical Impression  Pt exhibited motor planning difficulty due to cognitive impairments resulting in decreased  oral manipulation and mastication with inability to use straws or masticate solids. Suspect mild delay in swallow initiation without s/s aspiration. Educated family re: clinical results and recommendation of Dys 1 texture and thin liquids, when alert, pills crushed and full supervision/assist. Prognosis for return to regular textures is good once mental status improves. ST will follow.    Aspiration Risk  Moderate aspiration risk    Diet Recommendation Dysphagia 1 (Puree);Thin liquid   Liquid Administration via: Cup Medication Administration: Crushed with puree Supervision: Staff to assist with self feeding;Full supervision/cueing for compensatory strategies Compensations: Slow rate;Small sips/bites;Lingual sweep for clearance of pocketing Postural Changes: Seated upright at 90 degrees    Other  Recommendations Oral Care Recommendations: Oral care BID   Follow up Recommendations  None    Frequency and Duration min 2x/week  2 weeks       Prognosis Prognosis for Safe Diet Advancement: Good Barriers to Reach Goals: Cognitive deficits      Swallow Study   General HPI: 82 y.o. female with h/o HFpEF, atrial fibrillation, pulmonary HTN, hypothyroidism and pneumonia, presented to ED with altered mental status and L rib pain. CXR 2/21 diffuse airspace and interstitial edema have worsened within the L lung and stable in R lung. Small L pleural effusion is suspected. No known h/o SLP intervention. Daughter reported pt "coughed during meals for years but has gotten better recently.'  Type of Study: Bedside Swallow Evaluation Previous Swallow Assessment: see HPI Diet Prior to this Study: NPO Temperature Spikes Noted:  No Respiratory Status: Nasal cannula History of Recent Intubation: No Behavior/Cognition: Alert;Pleasant mood;Cooperative;Requires cueing Oral Cavity Assessment: Within Functional Limits Oral Care Completed by SLP: Yes Oral Cavity - Dentition: Adequate natural dentition Vision:  Functional for self-feeding Self-Feeding Abilities: Needs assist;Needs set up Patient Positioning: Upright in bed Baseline Vocal Quality: Normal Volitional Cough: Cognitively unable to elicit Volitional Swallow: Unable to elicit    Oral/Motor/Sensory Function Overall Oral Motor/Sensory Function:  (difficulty eliciting movements due to confusion)   Ice Chips Ice chips: Within functional limits Presentation: Spoon   Thin Liquid Thin Liquid: Impaired Presentation: Cup Oral Phase Impairments: Reduced lingual movement/coordination Oral Phase Functional Implications: Right anterior spillage;Left anterior spillage Pharyngeal  Phase Impairments: Suspected delayed Swallow    Nectar Thick Nectar Thick Liquid: Not tested   Honey Thick Honey Thick Liquid: Not tested   Puree Puree: Within functional limits   Solid   GO   Solid:  (pt unable)    Functional Assessment Tool Used:  (skilled clinical judgement) Functional Limitations: Swallowing Swallow Current Status [id number]): At least 20 percent but less than 40 percent impaired, limited or restricted Swallow Goal Status [phone]): At least 1 percent but less than 20 percent impaired, limited or restricted   Houston Siren 01/18/2016,5:42 PM  Orbie Pyo Colvin Caroli.Ed [employer] [phone]

## 2016-01-19 ENCOUNTER — Observation Stay (HOSPITAL_BASED_OUTPATIENT_CLINIC_OR_DEPARTMENT_OTHER): Payer: Medicare Other

## 2016-01-19 DIAGNOSIS — I10 Essential (primary) hypertension: Secondary | ICD-10-CM | POA: Diagnosis not present

## 2016-01-19 DIAGNOSIS — N184 Chronic kidney disease, stage 4 (severe): Secondary | ICD-10-CM | POA: Diagnosis not present

## 2016-01-19 DIAGNOSIS — R64 Cachexia: Secondary | ICD-10-CM | POA: Diagnosis not present

## 2016-01-19 DIAGNOSIS — G9340 Encephalopathy, unspecified: Secondary | ICD-10-CM | POA: Diagnosis not present

## 2016-01-19 DIAGNOSIS — I5032 Chronic diastolic (congestive) heart failure: Secondary | ICD-10-CM

## 2016-01-19 DIAGNOSIS — I5033 Acute on chronic diastolic (congestive) heart failure: Secondary | ICD-10-CM | POA: Diagnosis not present

## 2016-01-19 DIAGNOSIS — R079 Chest pain, unspecified: Secondary | ICD-10-CM

## 2016-01-19 DIAGNOSIS — I482 Chronic atrial fibrillation: Secondary | ICD-10-CM | POA: Diagnosis not present

## 2016-01-19 DIAGNOSIS — G92 Toxic encephalopathy: Secondary | ICD-10-CM | POA: Diagnosis not present

## 2016-01-19 DIAGNOSIS — R7989 Other specified abnormal findings of blood chemistry: Secondary | ICD-10-CM | POA: Diagnosis not present

## 2016-01-19 DIAGNOSIS — I130 Hypertensive heart and chronic kidney disease with heart failure and stage 1 through stage 4 chronic kidney disease, or unspecified chronic kidney disease: Secondary | ICD-10-CM | POA: Diagnosis not present

## 2016-01-19 DIAGNOSIS — R4182 Altered mental status, unspecified: Secondary | ICD-10-CM | POA: Diagnosis not present

## 2016-01-19 DIAGNOSIS — N179 Acute kidney failure, unspecified: Secondary | ICD-10-CM | POA: Diagnosis not present

## 2016-01-19 LAB — COMPREHENSIVE METABOLIC PANEL
ALT: 12 U/L — ABNORMAL LOW (ref 14–54)
AST: 20 U/L (ref 15–41)
Albumin: 3.1 g/dL — ABNORMAL LOW (ref 3.5–5.0)
Alkaline Phosphatase: 55 U/L (ref 38–126)
Anion gap: 12 (ref 5–15)
BUN: 26 mg/dL — AB (ref 6–20)
CHLORIDE: 107 mmol/L (ref 101–111)
CO2: 24 mmol/L (ref 22–32)
CREATININE: 1.56 mg/dL — AB (ref 0.44–1.00)
Calcium: 9.3 mg/dL (ref 8.9–10.3)
GFR, EST AFRICAN AMERICAN: 32 mL/min — AB (ref >60)
GFR, EST NON AFRICAN AMERICAN: 27 mL/min — AB (ref >60)
GLUCOSE: 131 mg/dL — AB (ref 65–99)
Potassium: 3.8 mmol/L (ref 3.5–5.1)
Sodium: 143 mmol/L (ref 135–145)
TOTAL PROTEIN: 7 g/dL (ref 6.5–8.1)
Total Bilirubin: 1.6 mg/dL — ABNORMAL HIGH (ref 0.3–1.2)

## 2016-01-19 LAB — URINE CULTURE: CULTURE: NO GROWTH

## 2016-01-19 LAB — CBC
HCT: 30.4 % — ABNORMAL LOW (ref 36.0–46.0)
Hemoglobin: 10 g/dL — ABNORMAL LOW (ref 12.0–15.0)
MCH: 31.6 pg (ref 26.0–34.0)
MCHC: 32.9 g/dL (ref 30.0–36.0)
MCV: 96.2 fL (ref 78.0–100.0)
PLATELETS: 188 10*3/uL (ref 150–400)
RBC: 3.16 MIL/uL — ABNORMAL LOW (ref 3.87–5.11)
RDW: 15.7 % — AB (ref 11.5–15.5)
WBC: 5.3 10*3/uL (ref 4.0–10.5)

## 2016-01-19 MED ORDER — BIOTENE DRY MOUTH MT LIQD: 15.0000 mL | OROMUCOSAL | Status: DC | PRN

## 2016-01-19 NOTE — Consult Note (Signed)
WOC wound consult note Reason for Consult: Consult requested for left arm skin tear.  Family states she fell at home and hit it in the shower. Wound type: Full thickness skin tear Measurement: 4X4X.1cm Wound bed: 10% white blistered area, 80% red wound bed with skin flap approximated over location, 10% yellow and moist Drainage (amount, consistency, odor) Scant amt tan drainage, no odor Periwound: Bruising to bilat arms Dressing procedure/placement/frequency: Silicone foam dressing to protect from further injury and promote healing.  Discussed plan of care with patient and family at bedside and they deny further questions. Please re-consult if further assistance is needed.  Thank-you,  Julien Girt MSN, [place], [place], [place], [place]

## 2016-01-19 NOTE — Progress Notes (Signed)
*  PRELIMINARY RESULTS* Echocardiogram Echo has been performed.  Leavy Cella 01/19/2016, 4:54 PM

## 2016-01-19 NOTE — Progress Notes (Signed)
PT Cancellation Note  Patient Details Name: JORGE LUMIA MRN: [id number] DOB: 1920-08-16   Cancelled Treatment:    Reason Eval/Treat Not Completed: Patient at procedure or test/unavailable (currently having Echo, will try back as time permits)   Duncan Dull 01/19/2016, 3:51 PM Alben Deeds, [place] DPT  [phone]

## 2016-01-19 NOTE — Progress Notes (Signed)
Family Medicine Teaching Service Daily Progress Note Intern Pager: [phone]  Patient name: Gina Wilkinson Medical record number: [id number] Date of birth: 10-15-1920 Age: 82 y.o. Gender: female  Primary Care Provider: Lupita Dawn, MD Consultants: None  Code Status: DNR  Pt Overview and Major Events to Date:   Assessment and Plan: Gina Wilkinson is a 82 y.o. female presenting with altered mental status and left rib pain. PMH is significant for HFpEF, atrial fibrillation, pulmonary HTN, hypothyroidism.  # Altered Mental Status: Significantly improved this AM. Patient orientated to self and place. Stated the year 29. Family states dementia is very mild. Patient generally alert and orientated x 3. Up to date on politics. Etc. Noted to be altered after receiving two doses of Baclofen (7:30pm on 2/20 and 3:00am on 2/21). Half life of 4hr, however Creatinine Clearance 17 and suspected to be extending adverse effects of medication. Urinalysis normal. Vitals stable. Head CT this AM negative  - Swallow evaluation, currently dysphagia 1 - Continue to follow mental status   # HFpEF: Euvolemic this AM. BNP 548. CXR with pulmonary edema. CT abdomen with pulmonary edema. Receive Lasix IV 40 x1 in ED.  - Daily weights 132 lbs> 132lbs unchanged  - Echocardiogram Pending  - Cardiology recs  #AKI: Scr 1.90> 1.77>1.56 (Baseline 1.3 ) Creatinine trending to baseline  - Will continue to monitor BMET   # Chest Pain:  No chest pain this AM. Troponin increased from 0.04 > 0.21> 0.19. EKG with right bundle branch block and afib> AM EKG, III and AVF with T-wave inversions. Nothing to do per cardiology  - Nitro PRN - Acetaminophen PRN pain  # Atrial Fibrillation: Currently prescribed Metoprolol, holding until able to take PO and bradycardia noted at admission. HR improved 70- 80s - Continue to follow per cards recs    # Hypertension: BP156/67. Holding home Metoprolol. - Continue Norvasc 5 mg for blood  pressure  - Hydralazine PRN - Monitor BP  # Hypothyroidism: Home medication includes Synthroid 166mcg. TSH 0.083  - continue synthriod   FEN/GI: Dysphagia 1 diet. PT/OT this Am  Prophylaxis: Lovenox  Disposition: Med-surg   Subjective:  Patient doing well this morning. Denies any chest pain, SOB. States she feels great. However per family not back to baseline.   Objective: Temp:  [98.1 F (36.7 C)-98.5 F (36.9 C)] 98.1 F (36.7 C) (02/23 0342) Pulse Rate:  [65-67] 65 (02/23 0342) Resp:  [20-21] 21 (02/23 0342) BP: (132-170)/(50-67) 155/67 mmHg (02/23 0342) SpO2:  [92 %-99 %] 93 % (02/23 0342) Weight:  [132 lb 4.8 oz (60.011 kg)] 132 lb 4.8 oz (60.011 kg) (02/23 0342) Physical Exam: General: NAD, lying in bed Cardiovascular:  Irregular rate, systolic murmur  Respiratory: CTAB, no wheezing or rhonchi  Abdomen: BS+,  No ttp, no rebound Extremities:  No pitting edema  Neuro: equal strength throughout   Laboratory:  Recent Labs Lab 01/17/16 1351 01/18/16 0550 01/19/16 0618  WBC 5.4 4.6 5.3  HGB 11.2* 9.2* 10.0*  HCT 35.0* 28.9* 30.4*  PLT 219 173 188    Recent Labs Lab 01/17/16 1351 01/18/16 0550 01/19/16 0618  NA 142 143 143  K 4.6 4.3 3.8  CL 104 105 107  CO2 24 25 24   BUN 38* 30* 26*  CREATININE 1.90* 1.77* 1.56*  CALCIUM 9.6 9.2 9.3  PROT 7.8 6.4* 7.0  BILITOT 1.3* 1.2 1.6*  ALKPHOS 71 58 55  ALT 11* 10* 12*  AST 18 15 20   GLUCOSE 113*  97 131*     Imaging/Diagnostic Tests: Ct Abdomen Pelvis Wo Contrast  01/17/2016  CLINICAL DATA:  Altered level of consciousness. Diffuse pain. Fall in shower. Initial encounter. EXAM: CT ABDOMEN AND PELVIS WITHOUT CONTRAST TECHNIQUE: Multidetector CT imaging of the abdomen and pelvis was performed following the standard protocol without IV contrast. COMPARISON:  03/16/2009 FINDINGS: Lower chest and abdominal wall: Large sliding hiatal hernia with essentially intrathoracic stomach. Interlobular septal thickening with  trace effusions. This correlates with CHF findings on prior chest x-ray. Hepatobiliary: Chronic calcified mass in the high right liver measuring 27 mm. The mass is benign given stability, question hyalinized fibroid.Small layering gallbladder calculi, newly seen. No obstructive or inflammatory changes Pancreas: Atrophy without ductal enlargement. Spleen: Unremarkable. Adrenals/Urinary Tract: Negative adrenals. Smooth renal atrophy, progressed. No hydronephrosis. Unremarkable bladder, as permitted by streak artifact from hip prosthesis Reproductive:Probable hysterectomy Stomach/Bowel: No inflammatory or obstructive changes. Extensive colonic diverticulosis distally. Large hiatal hernia. Vascular/Lymphatic: Diffuse atherosclerotic calcification. No acute vascular finding. No mass or adenopathy. Peritoneal: No ascites or pneumoperitoneum. Musculoskeletal: Motion new degradation at the level of the lower pelvis. No evidence of acute fracture. Total left hip arthroplasty without adverse findings are visualized. Medullary spaces are heterogeneous, similar to prior. This could be related to marked osteopenia or the patient's history of myelodysplastic syndrome. IMPRESSION: 1. No acute or posttraumatic intra-abdominal finding. 2. Pulmonary edema and trace effusions. 3. Cholelithiasis and other chronic findings are described above. Electronically Signed   By: Monte Fantasia M.D.   On: 01/17/2016 14:41   Dg Chest 2 View  01/17/2016  CLINICAL DATA:  Head injury EXAM: CHEST  2 VIEW COMPARISON:  None. FINDINGS: Moderate cardiomegaly. Diffuse airspace and interstitial edema have worsened within the left lung and is stable in the right lung. Cavity at the right base is consistent with the patient's known large hiatal hernia. Small left pleural effusion is suspected. No pneumothorax. Osteopenia. IMPRESSION: CHF with pulmonary edema as described above. Hiatal hernia. Electronically Signed   By: Marybelle Killings M.D.   On: 01/17/2016  13:39   Ct Head Wo Contrast  01/18/2016  CLINICAL DATA:  Altered mental status.  Agitation. EXAM: CT HEAD WITHOUT CONTRAST TECHNIQUE: Contiguous axial images were obtained from the base of the skull through the vertex without intravenous contrast. COMPARISON:  09/06/2012 FINDINGS: The brain shows generalized atrophy. There chronic small-vessel ischemic changes affecting the cerebral hemispheric white matter. No sign of acute infarction, mass lesion, hemorrhage, hydrocephalus or extra-axial collection. The calvarium is unremarkable. Sinuses are clear. There is atherosclerotic calcification of the major vessels at the base of the brain. IMPRESSION: No acute finding by CT. Atrophy and chronic small-vessel disease of the cerebral hemispheric white matter. Electronically Signed   By: Nelson Chimes M.D.   On: 01/18/2016 10:13    Ariele Vidrio Cletis Media, MD 01/19/2016, 10:37 AM PGY-1, [place] Intern pager: [phone], text pages welcome

## 2016-01-19 NOTE — Progress Notes (Signed)
     SUBJECTIVE: Mental status is improved.   Tele: atrial fib (reviewed by me)  BP 155/67 mmHg  Pulse 65  Temp(Src) 98.1 F (36.7 C) (Oral)  Resp 21  Ht 5\' 6"  (1.676 m)  Wt 132 lb 4.8 oz (60.011 kg)  BMI 21.36 kg/m2  SpO2 93%  Intake/Output Summary (Last 24 hours) at 01/19/16 [id number] Last data filed at 01/18/16 2200  Gross per 24 hour  Intake  457.5 ml  Output      0 ml  Net  457.5 ml    PHYSICAL EXAM General: Cachectic elderly female, alert, awake, pleasant.   Psych:  Good affect, responds appropriately Neck: No JVD. No masses noted.  Lungs: Clear bilaterally with no wheezes or rhonci noted.  Heart: irreg irreg with no murmurs noted. Abdomen: Bowel sounds are present. Soft, non-tender.  Extremities: No lower extremity edema.   LABS: Basic Metabolic Panel:  Recent Labs  01/17/16 1351 01/18/16 0550  NA 142 143  K 4.6 4.3  CL 104 105  CO2 24 25  GLUCOSE 113* 97  BUN 38* 30*  CREATININE 1.90* 1.77*  CALCIUM 9.6 9.2   CBC:  Recent Labs  01/17/16 1351 01/18/16 0550 01/19/16 0618  WBC 5.4 4.6 5.3  NEUTROABS 3.7  --   --   HGB 11.2* 9.2* 10.0*  HCT 35.0* 28.9* 30.4*  MCV 97.2 96.0 96.2  PLT 219 173 188   Cardiac Enzymes:  Recent Labs  01/17/16 1423 01/17/16 2210 01/18/16 0550  TROPONINI 0.04* 0.21* 0.19*   Current Meds: . amLODipine  5 mg Oral Daily  . enoxaparin (LOVENOX) injection  30 mg Subcutaneous Q24H  . levothyroxine  112 mcg Oral QAC breakfast     ASSESSMENT AND PLAN: 82 y/o female with history of myelodysplastic syndrome, mild cognitive impairment, permanent atrial fib (not on anticoagulation due to MDS), HTN, hypothyroidism, mod-severe pulm HTN in 2014, mild-mod AS by echo in 2014, chronic respiratory failure (on home O2 qhs and PRN), CKD stage IV (per review of labs - not formally called in chart, but baseline Cr appears 1.6-1.7) and chronic diastolic CHF. She presented to [facility] upon transfer [facility] for AMS of unclear etiology after  taking baclofen for a "twisting" injury in the shower without formal fall. Cardiology asked to see for elevated troponin and h/o CHF.  1. Altered mental status/acute encephalopathy: Etiology unclear. It was suspected possibly due to baclofen on admission but her AMS has improved. Further workup per IM.   2. Elevated troponin: Very subtle elevation of troponin. This does not appear clinically relevant at present time. She is not complaining of chest pain or dyspnea. She is not currently a candidate for further ischemic workup given her overall state, advanced age and AMS. Will follow up on echo.   3. Permanent atrial fibrillation with slow ventricular response: Metoprolol has been stopped. HR 60s this am. She is not a candidate for anticoagulation (followed in EP clinic by Dr. Lovena Le).    4. Chronic diastolic CHF: Volume status is ok. Marland Kitchen    Gina Wilkinson  2/23/20177:42 AM

## 2016-01-19 NOTE — Progress Notes (Signed)
Speech Language Pathology Treatment: Dysphagia  Patient Details Name: Gina Wilkinson MRN: [id number] DOB: 11-Nov-1920 Today's Date: 01/19/2016 Time: [id number] SLP Time Calculation (min) (ACUTE ONLY): 14 min  Assessment / Plan / Recommendation Clinical Impression  Pt more alert and aware of surroundings and situation. She exhibited typical oral manipulation, mastication and transit with thin liquid and straw. No indications of airway compromise and no cues required. Recommend regular texture and thin liquids, straws allowed and pills whole in applesauce may be safest for several days. No further ST needed.   HPI HPI: 82 y.o. female with h/o HFpEF, atrial fibrillation, pulmonary HTN, hypothyroidism and pneumonia, presented to ED with altered mental status and L rib pain. CXR 2/21 diffuse airspace and interstitial edema have worsened within the L lung and stable in R lung. Small L pleural effusion is suspected. No known h/o SLP intervention. Daughter reported pt "coughed during meals for years but has gotten better recently.'       SLP Plan  Discharge SLP treatment due to (comment)     Recommendations  Diet recommendations: Regular;Thin liquid Liquids provided via: Cup;Straw Medication Administration: Whole meds with puree Supervision: Patient able to self feed;Intermittent supervision to cue for compensatory strategies Compensations: Slow rate;Small sips/bites Postural Changes and/or Swallow Maneuvers: Seated upright 90 degrees             Oral Care Recommendations: Oral care BID Follow up Recommendations: None Plan: Discharge SLP treatment due to (comment)     GO           Functional Assessment Tool Used:  (skilled clinical judgement) Functional Limitations: Swallowing Swallow Current Status [id number]): At least 1 percent but less than 20 percent impaired, limited or restricted Swallow Goal Status [phone]): At least 1 percent but less than 20 percent impaired, limited or  restricted Swallow Discharge Status [phone]): At least 1 percent but less than 20 percent impaired, limited or restricted    Gina Wilkinson 01/19/2016, 11:22 AM  Gina Wilkinson.Ed [employer] [phone]

## 2016-01-20 ENCOUNTER — Telehealth: Payer: Self-pay | Admitting: Family Medicine

## 2016-01-20 DIAGNOSIS — G9340 Encephalopathy, unspecified: Secondary | ICD-10-CM | POA: Diagnosis not present

## 2016-01-20 DIAGNOSIS — I272 Other secondary pulmonary hypertension: Secondary | ICD-10-CM

## 2016-01-20 DIAGNOSIS — N179 Acute kidney failure, unspecified: Secondary | ICD-10-CM | POA: Diagnosis not present

## 2016-01-20 DIAGNOSIS — I130 Hypertensive heart and chronic kidney disease with heart failure and stage 1 through stage 4 chronic kidney disease, or unspecified chronic kidney disease: Secondary | ICD-10-CM | POA: Diagnosis not present

## 2016-01-20 DIAGNOSIS — N184 Chronic kidney disease, stage 4 (severe): Secondary | ICD-10-CM

## 2016-01-20 DIAGNOSIS — I350 Nonrheumatic aortic (valve) stenosis: Secondary | ICD-10-CM

## 2016-01-20 DIAGNOSIS — R64 Cachexia: Secondary | ICD-10-CM | POA: Diagnosis not present

## 2016-01-20 DIAGNOSIS — R4182 Altered mental status, unspecified: Secondary | ICD-10-CM | POA: Diagnosis not present

## 2016-01-20 DIAGNOSIS — R7989 Other specified abnormal findings of blood chemistry: Secondary | ICD-10-CM | POA: Diagnosis not present

## 2016-01-20 DIAGNOSIS — I482 Chronic atrial fibrillation: Secondary | ICD-10-CM | POA: Diagnosis not present

## 2016-01-20 DIAGNOSIS — I5033 Acute on chronic diastolic (congestive) heart failure: Secondary | ICD-10-CM | POA: Diagnosis not present

## 2016-01-20 DIAGNOSIS — G92 Toxic encephalopathy: Secondary | ICD-10-CM | POA: Diagnosis not present

## 2016-01-20 DIAGNOSIS — I5032 Chronic diastolic (congestive) heart failure: Secondary | ICD-10-CM | POA: Diagnosis not present

## 2016-01-20 MED ORDER — AMLODIPINE BESYLATE 5 MG PO TABS: 5.0000 mg | ORAL_TABLET | Freq: Every day | ORAL | Status: DC

## 2016-01-20 NOTE — Evaluation (Signed)
Physical Therapy Evaluation Patient Details Name: Gina Wilkinson MRN: [id number] DOB: January 26, 1920 Today's Date: 01/20/2016   History of Present Illness  Gina Wilkinson is a 82 y.o. female presenting with altered mental status and left rib pain. PMH is significant for HFpEF, atrial fibrillation, pulmonary HTN, hypothyroidism.  Clinical Impression  Pt presenting with the above medical history very pleasant and agreeable to participate in therapy. Daughter present t/o session and confirmed PLF due to patients history of dementia. Pt has not been up OOB since Monday and presents today with the PT deficits listed below limiting mobility and safety. Pt fatigues quickly and required Min-Mod A for transfers and gait today. Pt was ambulating with supervision PTA and is now at a high fall risk requiring hands on assist. Recommending HHPT upon D/C with 24/7 assistance from care team and family.     Follow Up Recommendations Home health PT;Supervision/Assistance - 24 hour    Equipment Recommendations  3in1 (PT)    Recommendations for Other Services       Precautions / Restrictions Precautions Precautions: Fall Restrictions Weight Bearing Restrictions: No      Mobility  Bed Mobility Overal bed mobility: Needs Assistance Bed Mobility: Supine to Sit     Supine to sit: Min assist     General bed mobility comments: VC's for hand palcement and use of bed rail to rise, manual assist to bring trunk upright, large posterior lean  Transfers Overall transfer level: Needs assistance Equipment used: Rolling walker (2 wheeled) Transfers: Sit to/from Stand Sit to Stand: Mod assist         General transfer comment: Mod A from commode, Min A from bed, slow to initiate, VCs to safe hand palcement and walker use once standing, does not achieve full upright posture  Ambulation/Gait Ambulation/Gait assistance: Min assist Ambulation Distance (Feet): 15 Feet (15 ft x 2) Assistive device: Rolling  walker (2 wheeled) Gait Pattern/deviations: Step-through pattern;Decreased stride length;Shuffle;Leaning posteriorly;Trunk flexed Gait velocity: decreased Gait velocity interpretation: <1.8 ft/sec, indicative of risk for recurrent falls General Gait Details: Vcs for foot clearance and manual assist for forward progression of the walker, easily fatigued, verbal cues for upright posture  Stairs            Wheelchair Mobility    Modified Rankin (Stroke Patients Only)       Balance Overall balance assessment: Needs assistance Sitting-balance support: Feet unsupported;Bilateral upper extremity supported Sitting balance-Leahy Scale: Poor Sitting balance - Comments: posterior lean on EOB   Standing balance support: Bilateral upper extremity supported;During functional activity Standing balance-Leahy Scale: Poor Standing balance comment: relies heavily on RW/sink for support                             Pertinent Vitals/Pain Pain Assessment: No/denies pain    Home Living Family/patient expects to be discharged to:: Private residence Living Arrangements: Alone Available Help at Discharge: Personal care attendant;Available 24 hours/day;Family Type of Home: House Home Access: Stairs to enter Entrance Stairs-Rails: Right Entrance Stairs-Number of Steps: 4 Home Layout: One level Home Equipment: Walker - 2 wheels;Toilet riser Additional Comments: 24/7 care attendents    Prior Function Level of Independence: Needs assistance   Gait / Transfers Assistance Needed: Uses RW  ADL's / Homemaking Assistance Needed: care attendents assist with some transfers and hygiene needs when asked  Comments: uses home O2     Hand Dominance        Extremity/Trunk Assessment  Upper Extremity Assessment: Defer to OT evaluation           Lower Extremity Assessment: Generalized weakness      Cervical / Trunk Assessment: Kyphotic  Communication   Communication: No  difficulties  Cognition Arousal/Alertness: Awake/alert Behavior During Therapy: WFL for tasks assessed/performed Overall Cognitive Status: History of cognitive impairments - at baseline                      General Comments General comments (skin integrity, edema, etc.): O2 on RA 85%, replaced  on 2L, O2 recovered to 94%, pt transferred to restroom needing assist for personal hygiene    Exercises        Assessment/Plan    PT Assessment Patient needs continued PT services  PT Diagnosis Difficulty walking;Generalized weakness   PT Problem List Decreased strength;Decreased range of motion;Decreased activity tolerance;Decreased balance;Decreased mobility;Decreased coordination;Decreased knowledge of use of DME;Decreased safety awareness  PT Treatment Interventions DME instruction;Gait training;Stair training;Functional mobility training;Therapeutic activities;Therapeutic exercise;Balance training;Patient/family education   PT Goals (Current goals can be found in the Care Plan section) Acute Rehab PT Goals Patient Stated Goal: get stronger and go to the bathroom PT Goal Formulation: With patient Time For Goal Achievement: 02/03/16 Potential to Achieve Goals: Fair    Frequency Min 3X/week   Barriers to discharge        Co-evaluation               End of Session Equipment Utilized During Treatment: Gait belt;Oxygen Activity Tolerance: Patient tolerated treatment well;Patient limited by fatigue Patient left: in chair;with call bell/phone within reach;with family/visitor present Nurse Communication: Mobility status    Functional Assessment Tool Used: clinical observation Functional Limitation: Mobility: Walking and moving around Mobility: Walking and Moving Around Current Status [id number]): At least 20 percent but less than 40 percent impaired, limited or restricted Mobility: Walking and Moving Around Goal Status [phone]): At least 1 percent but less than 20 percent  impaired, limited or restricted    Time: 0902-0932 PT Time Calculation (min) (ACUTE ONLY): 30 min   Charges:   PT Evaluation $PT Eval Moderate Complexity: 1 Procedure PT Treatments $Therapeutic Activity: 8-22 mins   PT G Codes:   PT G-Codes **NOT FOR INPATIENT CLASS** Functional Assessment Tool Used: clinical observation Functional Limitation: Mobility: Walking and moving around Mobility: Walking and Moving Around Current Status [id number]): At least 20 percent but less than 40 percent impaired, limited or restricted Mobility: Walking and Moving Around Goal Status [phone]): At least 1 percent but less than 20 percent impaired, limited or restricted    Ara Kussmaul 01/20/2016, [place], Student Physical Therapist Acute Rehab [phone]

## 2016-01-20 NOTE — Evaluation (Signed)
Occupational Therapy Evaluation Patient Details Name: Gina Wilkinson MRN: [id number] DOB: November 26, 1920 Today's Date: 01/20/2016    History of Present Illness [place] is a 82 y.o. female presenting with altered mental status and left rib pain. PMH is significant for HFpEF, atrial fibrillation, pulmonary HTN, hypothyroidism.   Clinical Impression   PT admitted with encephalopathy. Pt currently with functional limitiations due to the deficits listed below (see OT problem list). PTA living at home with 24/7 (A) from comfort keepers service. Pt has rotating care givers. Pt will benefit from skilled OT to increase their independence and safety with adls and balance to allow discharge [place].     Follow Up Recommendations  Home health OT;Supervision/Assistance - 24 hour    Equipment Recommendations  3 in 1 bedside comode    Recommendations for Other Services       Precautions / Restrictions Precautions Precautions: Fall Restrictions Weight Bearing Restrictions: No      Mobility Bed Mobility               General bed mobility comments: in chair on arrival  Transfers Overall transfer level: Needs assistance Equipment used: Rolling walker (2 wheeled) Transfers: Sit to/from Stand Sit to Stand: Min assist         General transfer comment: cues for hand placement    Balance           Standing balance support: Bilateral upper extremity supported;During functional activity Standing balance-Leahy Scale: Fair                              ADL Overall ADL's : Needs assistance/impaired     Grooming: Wash/dry hands;Minimal assistance;Standing Grooming Details (indicate cue type and reason): cues to use soap and detail to task     Lower Body Bathing: Maximal assistance;Sit to/from stand Lower Body Bathing Details (indicate cue type and reason): Gina care due to void of bladder and lack of awareness Upper Body Dressing : Minimal  assistance;Sitting   Lower Body Dressing: Maximal assistance;Sit to/from stand Lower Body Dressing Details (indicate cue type and reason): don mess panties and pad Toilet Transfer: Minimal assistance;Ambulation Toilet Transfer Details (indicate cue type and reason): cues for sequence but able to transfer 1x (A)         Functional mobility during ADLs: Minimal assistance;Rolling walker General ADL Comments: pt incontinent of bladder in chair at start of session. Question if daughter verbalizing pending transfer to 3n1 helped patient inititate task prematurely. pt unable to void on 3n1 in hat for culture.     Vision Vision Assessment?: Yes Eye Alignment: Within Functional Limits Ocular Range of Motion: Within Functional Limits Tracking/Visual Pursuits: Requires cues, head turns, or add eye shifts to track Additional Comments: pt difficult time holding head still due to cognitive deficits   Perception     Praxis      Pertinent Vitals/Pain Pain Assessment: No/denies pain     Hand Dominance     Extremity/Trunk Assessment Upper Extremity Assessment Upper Extremity Assessment: Overall WFL for tasks assessed   Lower Extremity Assessment Lower Extremity Assessment: Defer to PT evaluation   Cervical / Trunk Assessment Cervical / Trunk Assessment: Kyphotic   Communication Communication Communication: No difficulties   Cognition Arousal/Alertness: Awake/alert Behavior During Therapy: WFL for tasks assessed/performed Overall Cognitive Status: History of cognitive impairments - at baseline  General Comments       Exercises       Shoulder Instructions      Home Living Family/patient expects to be discharged to:: Private residence Living Arrangements: Alone Available Help at Discharge: Personal care attendant;Available 24 hours/day;Family Type of Home: House Home Access: Stairs to enter [employer] of Steps: 4 Entrance  Stairs-Rails: Right Home Layout: One level     Bathroom Shower/Tub: Occupational psychologist: Handicapped height Bathroom Accessibility: Yes   Home Equipment: Environmental consultant - 2 wheels;Toilet riser   Additional Comments: 24/7 care attendents "comfort keepers"      Prior Functioning/Environment Level of Independence: Needs assistance  Gait / Transfers Assistance Needed: Uses RW ADL's / Homemaking Assistance Needed: care attendents assist with some transfers and hygiene needs when asked Communication / Swallowing Assistance Needed: no difficulties Comments: uses home O2    OT Diagnosis: Generalized weakness;Cognitive deficits   OT Problem List: Decreased strength;Impaired balance (sitting and/or standing);Decreased activity tolerance;Decreased safety awareness;Decreased cognition;Decreased knowledge of use of DME or AE;Decreased knowledge of precautions;Cardiopulmonary status limiting activity   OT Treatment/Interventions: Self-care/ADL training;Therapeutic exercise;Energy conservation;DME and/or AE instruction;Therapeutic activities;Cognitive remediation/compensation;Patient/family education;Balance training    OT Goals(Current goals can be found in the care plan section) Acute Rehab OT Goals Patient Stated Goal: daughter wants mother to return home with (A) OT Goal Formulation: With family Time For Goal Achievement: 02/03/16 Potential to Achieve Goals: Good  OT Frequency: Min 2X/week   Barriers to D/C: Other (comment) (comfort keepers)          Co-evaluation              End of Session Equipment Utilized During Treatment: Gait belt;Rolling walker Nurse Communication: Mobility status;Precautions  Activity Tolerance: Patient tolerated treatment well Patient left: in chair;with call bell/phone within reach;with family/visitor present;with nursing/sitter in room   Time: 1201-1228 OT Time Calculation (min): 27 min Charges:  OT General Charges $OT Visit: 1  Procedure OT Evaluation $OT Eval Moderate Complexity: 1 Procedure OT Treatments $Self Care/Home Management : 8-22 mins G-Codes:    Gina Wilkinson 2016-02-17, 1:39 PM    Gina Wilkinson   OTR/L PagerOH:[phone] Office: [phone] .

## 2016-01-20 NOTE — Telephone Encounter (Signed)
Daughter called because her mother has been in the hospital for several days and she is not really admitted but at observation status and she would like this corrected. Please as soon as possible. jw

## 2016-01-20 NOTE — Progress Notes (Signed)
1556 discharged to home with family members . Wheeled to lobby by Nt Discharge instructions given to daughters. Verbalizedv undrstanding.

## 2016-01-20 NOTE — Progress Notes (Signed)
SUBJECTIVE: She feels better. Per family she is close to baseline. She is in a chair reading a book.   BP 99/60 mmHg  Pulse 67  Temp(Src) 98 F (36.7 C) (Oral)  Resp 18  Ht 5\' 6"  (1.676 m)  Wt 134 lb 12.8 oz (61.145 kg)  BMI 21.77 kg/m2  SpO2 99%  Intake/Output Summary (Last 24 hours) at 01/20/16 1110 Last data filed at 01/20/16 0850  Gross per 24 hour  Intake    680 ml  Output      0 ml  Net    680 ml    PHYSICAL EXAM General: Well developed, well nourished, in no acute distress. Alert and oriented x 3.  Psych:  Good affect, responds appropriately Neck: No JVD. No masses noted.  Lungs: Clear bilaterally with no wheezes or rhonci noted.  Heart: irreg irreg with loud systolic murmur.  Abdomen: Bowel sounds are present. Soft, non-tender.  Extremities: No lower extremity edema.   LABS: Basic Metabolic Panel:  Recent Labs  01/18/16 0550 01/19/16 0618  NA 143 143  K 4.3 3.8  CL 105 107  CO2 25 24  GLUCOSE 97 131*  BUN 30* 26*  CREATININE 1.77* 1.56*  CALCIUM 9.2 9.3   CBC:  Recent Labs  01/17/16 1351 01/18/16 0550 01/19/16 0618  WBC 5.4 4.6 5.3  NEUTROABS 3.7  --   --   HGB 11.2* 9.2* 10.0*  HCT 35.0* 28.9* 30.4*  MCV 97.2 96.0 96.2  PLT 219 173 188   Cardiac Enzymes:  Recent Labs  01/17/16 1423 01/17/16 2210 01/18/16 0550  TROPONINI 0.04* 0.21* 0.19*   Current Meds: . amLODipine  5 mg Oral Daily  . enoxaparin (LOVENOX) injection  30 mg Subcutaneous Q24H  . levothyroxine  112 mcg Oral QAC breakfast   Echo 01/19/16: Left ventricle: The cavity size was normal. Wall thickness was increased in a pattern of moderate LVH. Systolic function was vigorous. The estimated ejection fraction was in the range of 65% to 70%. Wall motion was normal; there were no regional wall motion abnormalities. Doppler parameters are consistent with high ventricular filling pressure. - Aortic valve: Valve mobility was restricted. There was  severe stenosis. Valve area (VTI): 0.87 cm^2. Valve area (Vmax): 0.83 cm^2. Valve area (Vmean): 0.81 cm^2. - Mitral valve: Calcified annulus. There was mild regurgitation. Valve area by continuity equation (using LVOT flow): 2.06 cm^2. - Left atrium: The atrium was severely dilated. - Right ventricle: The cavity size was mildly dilated. - Right atrium: The atrium was moderately dilated. - Pulmonary arteries: Systolic pressure was severely increased. PA peak pressure: 58 mm Hg (S).  Impressions:  - Vigorous LV function; elevated LV filling pressure; biatrial enlargement; mild RVE; calcified aortic valve with severe AS; mild MR and TR; severely elevated pulmonary pressure.  ASSESSMENT AND PLAN: 82 y/o female with history of myelodysplastic syndrome, mild cognitive impairment, permanent atrial fib (not on anticoagulation due to MDS), HTN, hypothyroidism, mod-severe pulm HTN in 2014, mild-mod AS by echo in 2014, chronic respiratory failure (on home O2 qhs and PRN), CKD stage IV (per review of labs - not formally called in chart, but baseline Cr appears 1.6-1.7) and chronic diastolic CHF. She presented to [facility] upon transfer from [facility] for AMS of unclear etiology after taking baclofen for a "twisting" injury in the shower without formal fall. Cardiology asked to see for elevated troponin and h/o CHF.  1. Altered mental status/acute encephalopathy: Etiology unclear. It was suspected possibly due to  baclofen on admission but her AMS has improved. Further workup per IM.   2. Elevated troponin: Very subtle elevation of troponin. This does not appear clinically relevant at present time. She is not complaining of chest pain or dyspnea. She is not currently a candidate for further ischemic workup given her overall state, advanced age and AMS. Will follow up on echo.   3. Permanent atrial fibrillation with slow ventricular response: Metoprolol has been stopped. HR 60s this am. She is not a  candidate for anticoagulation (followed in EP clinic by Dr. Lovena Le).   4. Chronic diastolic CHF: Volume status is ok. .  5. Severe aortic valve stenosis: Given advanced age and dementia, would not consider her to be a candidate for TAVR or surgical AVR.   OK for discharge from our standpoint. Follow up 2-3 weeks with Dr. Lovena Le.    Gina Wilkinson  2/24/201711:10 AM

## 2016-01-20 NOTE — Discharge Summary (Signed)
[facility] Discharge Summary  Patient name: Gina Wilkinson Medical record number: [id number] Date of birth: Sep 09, 1920 Age: 82 y.o. Gender: female Date of Admission: 01/17/2016  Date of Discharge: 01/20/2016 Admitting Physician: Leeanne Rio, MD  Primary Care Provider: Lupita Dawn, MD Consultants: Cardiology    Indication for Hospitalization: Altered mental status  Discharge Diagnoses/Problem List:  Atrial fibrillation Aortic stenosis Pulmonary hypertension Hypothyroidism HFpEF   Disposition: 24 hour nursing care at home   Discharge Condition: Stable  Discharge Exam: Filed Vitals:   01/20/16 0542 01/20/16 0900 01/20/16 1233 01/20/16 1432  BP: 124/43 99/60 132/41 138/48  Pulse: 49 67 53 49  Temp: 98 F (36.7 C)   97.3 F (36.3 C)  TempSrc: Oral   Oral  Resp: 18   18  Height:      Weight:      SpO2: 94% 99% 99% 95%   General: NAD, lying in bed Cardiovascular: Irregular rate, systolic murmur  Respiratory: CTAB, no wheezing or rhonchi  Abdomen: BS+, No ttp, no rebound Extremities: No pitting edema   Brief Hospital Course:  Patient  was admitted for altered mental status at [facility] following history of a fall and having received baclofen 2 after being prescribed this medication the day before for muscle pain. CT head was negative for acute ischemic changes. UA was negative. Altered mental status was thought likely due to new addition of baclofen. Patient improved throughout her stay back to baseline at discharge.   During time in the ED, a CT abdomen was performed on a due to nature of the fall and patient was found to have slight pulmonary edema with trace pleural effusions. BNP was noted to 548.  Patient received a single dose of IV Lasix at 40 mg in the ED and was then transferred to [facility] where she was evaluated. Volume status was much improved by time of admission to [facility], with patient euvolemic on exam and   therefore Lasix was held for heart failure team to reassess.  On, admission patient was noted to a mildly elevated troponin (0.21> 0.19) that trended down throughout stay and an EKG with stable atrial fibrillation. Cardiology  assessed patient and agreed  no further Lasix during patient's stay. Echo was performed to determine patient's heart function and was noted to have a normal systolic function, with significant pulmonary hypertension and severe aortic stenosis. Per cardiology patient was not a candidate for TAVR or surgical AVR. Patient's metoprolol was stopped during this admission as patient was bradycardic despite atrial fibrillation. She also noted to have slightly elevated creatinine which trended back down to patient's baseline CKD over the next couple of days.  Issues for Follow Up:  1.  Patient with severe aortic valve stenosis, following with cardiology, no further work up but could result increased risk of syncope in the future. Make sure family is aware of this. 2. Patient started on Norvasc 5mg  for hypertension, please follow up on blood pressures  3.  During hospitalization stopped patient's Lopressor due to bradycardia, continue to assess, cardiology following  4. Stopped daily lasix 20 mg at discharge, as patient was euvolemic, assess fluid status at visit   Significant Procedures: None   Significant Labs and Imaging:   Recent Labs Lab 01/17/16 1351 01/18/16 0550 01/19/16 0618  WBC 5.4 4.6 5.3  HGB 11.2* 9.2* 10.0*  HCT 35.0* 28.9* 30.4*  PLT 219 173 188    Recent Labs Lab 01/17/16 1331 01/17/16 1351  01/18/16 0550 01/19/16 0618  NA 141 142 143 143  K 4.4 4.6 4.3 3.8  CL 104 104 105 107  CO2  --  24 25 24   GLUCOSE 113* 113* 97 131*  BUN 37* 38* 30* 26*  CREATININE 1.80* 1.90* 1.77* 1.56*  CALCIUM  --  9.6 9.2 9.3  ALKPHOS  --  71 58 55  AST  --  18 15 20   ALT  --  11* 10* 12*  ALBUMIN  --  4.0 3.0* 3.1*   Ct Head Wo Contrast  01/18/2016  CLINICAL DATA:   Altered mental status.  Agitation. EXAM: CT HEAD WITHOUT CONTRAST TECHNIQUE: Contiguous axial images were obtained from the base of the skull through the vertex without intravenous contrast. COMPARISON:  09/06/2012 FINDINGS: The brain shows generalized atrophy. There chronic small-vessel ischemic changes affecting the cerebral hemispheric white matter. No sign of acute infarction, mass lesion, hemorrhage, hydrocephalus or extra-axial collection. The calvarium is unremarkable. Sinuses are clear. There is atherosclerotic calcification of the major vessels at the base of the brain. IMPRESSION: No acute finding by CT. Atrophy and chronic small-vessel disease of the cerebral hemispheric white matter. Electronically Signed   By: Nelson Chimes M.D.   On: 01/18/2016 10:13    Results/Tests Pending at Time of Discharge: None  Discharge Medications:    Medication List    STOP taking these medications        baclofen 10 MG tablet  Commonly known as:  LIORESAL     clindamycin 300 MG capsule  Commonly known as:  CLEOCIN     furosemide 40 MG tablet  Commonly known as:  LASIX     ibuprofen 200 MG tablet  Commonly known as:  ADVIL,MOTRIN     metoprolol tartrate 25 MG tablet  Commonly known as:  LOPRESSOR      TAKE these medications        amLODipine 5 MG tablet  Commonly known as:  NORVASC  Take 1 tablet (5 mg total) by mouth daily.     aspirin 81 MG tablet  Take 81 mg by mouth daily.     ferrous sulfate 325 (65 FE) MG tablet  Take 325 mg by mouth 2 (two) times a week. Mondays and Thursdays.     NON FORMULARY  Oxygen 2 Liters every night and as needed     SYNTHROID 112 MCG tablet  Generic drug:  levothyroxine  TAKE 1 TABLET (112 MCG TOTAL) BY MOUTH DAILY. CANNOT BE GENERIC. MUST BE SYNTHROID. (WILL PAY 7/26)     vitamin B-12 250 MCG tablet  Commonly known as:  CYANOCOBALAMIN  Take 250 mcg by mouth daily.     Vitamin D3 3000 units Tabs  Take 3,000 Units by mouth daily.         Discharge Instructions: Please refer to Patient Instructions section of EMR for full details.  Patient was counseled important signs and symptoms that should prompt return to medical care, changes in medications, dietary instructions, activity restrictions, and follow up appointments.   Follow-Up Appointments: Follow-up Information    Follow up with Lupita Dawn, MD. Go on 02/03/2016.   Specialty:  Family Medicine   Why:  @10 ;45am   Contact information:   [address] [place] [address] [phone]       Follow up with Lavon Paganini, MD. Go on 01/27/2016.   Specialty:  Family Medicine   Why:  Hospital follow-up @ 9:30 AM    Contact information:  [address] [place] [address] [phone]       Masato Pettie Cletis Media, MD 01/21/2016, 6:31 PM PGY-1, [place]

## 2016-01-20 NOTE — Telephone Encounter (Signed)
Attempted to contact both [name] and [name] (daughters) - no answer, left message to return my call

## 2016-01-20 NOTE — Care Management Note (Signed)
Case Management Note  Patient Details  Name: Gina Wilkinson MRN: [id number] Date of Birth: 1919-12-22  Subjective/Objective:      Admitted to Observation with Acute encephalopathy              Action/Plan: Patient lives with private caregivers, daughter in room at this time. HHC choice offered, daughter chose [facility]. Butch Penny with [facility] called for arrangements. 3:1 ordered as requested.  CM talked to daughter 01/18/2016 concerning Observational status and that the patient have to meet criteria for Inpatient Status per Medicare guidelines. She was upset and stated " I want it changed to Inpatient now." CM informed her that her status cannot be changed to Inpatient unless she qualifies. Medical Director Dr Reynaldo Minium and [name] RN reviewed case at the time of admission and case is determined as Observation; Observation notice given to pt/ daughter but she refused to sign the document.  Expected Discharge Date:  01/20/2016               Expected Discharge Plan:  [place]  Discharge planning Services  CM Consult Choice offered to:  Adult Children  DME Arranged:  3-N-1 DME Agency:  [place] Arranged:  PT [place] Agency:  [place]  Status of Service:  In process, will continue to follow  Sherrilyn Rist [phone] 01/20/2016, 12:03 PM

## 2016-01-20 NOTE — Telephone Encounter (Signed)
Spoke to [employer] about observation vs Inpatient status. I informed her that since I am not the admitting physician I have little influence to change this status. However, I would contact Dr. Ardelia Mems and she will address the issue.  Spoke with Dr. Ardelia Mems who was about to enter the patient's room and is willing to discuss with the care management team.

## 2016-01-20 NOTE — Discharge Instructions (Signed)
You where admitted for altered mental status. You were found to have some decreased kidney function you received fluids for this. You were seen by cardiology for your initial chest pain and they imaged your heart and noted some valvular changes.  Your heart rate was low and therefore we stopped your metoprolol. Please discuss this with your primary care physician when you see them. You were noted to have elevated blood pressure and therefore we started you on a drug for your blood pressure called Norvasc. Please continue taking this. Please follow-up [facility].

## 2016-01-20 NOTE — Progress Notes (Signed)
Family Medicine Teaching Service Daily Progress Note Intern Pager: [phone]  Patient name: Gina Wilkinson Medical record number: [id number] Date of birth: Jul 11, 1920 Age: 82 y.o. Gender: female  Primary Care Provider: Lupita Dawn, MD Consultants: None  Code Status: DNR  Pt Overview and Major Events to Date:   Assessment and Plan: Gina Wilkinson is a 82 y.o. female presenting with altered mental status and left rib pain. PMH is significant for HFpEF, atrial fibrillation, pulmonary HTN, hypothyroidism.  # Altered Mental Status: Patient orientated to self and place, still not orientated to time. States she does not care about time.  Per daughter states ready for  Noted to be altered after receiving two doses of Baclofen (7:30pm on 2/20 and 3:00am on 2/21). Half life of 4hr, however Creatinine Clearance 17 and suspected to be extending adverse effects of medication. Urinalysis normal. Vitals stable. Head CT this AM negative  - Continue to follow mental status  - PT./OT awaiting recs   # HFpEF: Bibasilar crackles, however no lower extremity edema. Likely due to respiratory effort. BNP 548. CXR with pulmonary edema. CT abdomen with pulmonary edema. Receive Lasix IV 40 x1 in ED.  - Daily weights 132 lbs> 132lbs unchanged  - Echocardiogram range of 65% to 70%, However severe aortic valve stenosis; Given advanced age and dementia, would not consider her to be a candidate for TAVR or surgical AVR.  - Cardiology recs - incentive spirometry   #AKI: Scr 1.90> 1.77>1.56 (Baseline 1.3 ) Creatinine trending to baseline  - Will continue to monitor BMET   # Chest Pain:  No chest pain this AM. Troponin increased from 0.04 > 0.21> 0.19. EKG with right bundle branch block and afib> AM EKG, III and AVF with T-wave inversions. Nothing to do per cardiology  - Nitro PRN - Acetaminophen PRN pain  # Atrial Fibrillation: Currently prescribed Metoprolol, holding until able to take PO and bradycardia noted  at admission. HR 40- 50s  - will stop metropolol on discharge  - Continue to follow per cards recs    # Hypertension: BP124/43. Holding home Metoprolol. - Continue Norvasc 5 mg for blood pressure  - Hydralazine PRN - Monitor BP  # Hypothyroidism: Home medication includes Synthroid 129mcg. TSH 0.083  - continue synthriod   FEN/GI: Regular Diet; PT/OT AM  Prophylaxis: Lovenox  Disposition: Med-surg   Subjective:  Patient doing well per her not back to baseline. However, daughter feels she is ready for discharge. Noted smell in urine last night and would like a UA to check urine. No pain with urination. No chest pain, SOB, n/v   Objective: Temp:  [98 F (36.7 C)-99.1 F (37.3 C)] 98 F (36.7 C) (02/24 0542) Pulse Rate:  [49-55] 49 (02/24 0542) Resp:  [18-20] 18 (02/24 0542) BP: (124-156)/(43-76) 124/43 mmHg (02/24 0542) SpO2:  [86 %-94 %] 94 % (02/24 0542) Weight:  [134 lb 12.8 oz (61.145 kg)] 134 lb 12.8 oz (61.145 kg) (02/24 0244) Physical Exam: General: NAD, lying in bed Cardiovascular:  Irregular rate, systolic murmur  Respiratory: CTAB, no wheezing or rhonchi  Abdomen: BS+,  No ttp, no rebound Extremities:  No pitting edema   Laboratory:  Recent Labs Lab 01/17/16 1351 01/18/16 0550 01/19/16 0618  WBC 5.4 4.6 5.3  HGB 11.2* 9.2* 10.0*  HCT 35.0* 28.9* 30.4*  PLT 219 173 188    Recent Labs Lab 01/17/16 1351 01/18/16 0550 01/19/16 0618  NA 142 143 143  K 4.6 4.3 3.8  CL 104  105 107  CO2 24 25 24   BUN 38* 30* 26*  CREATININE 1.90* 1.77* 1.56*  CALCIUM 9.6 9.2 9.3  PROT 7.8 6.4* 7.0  BILITOT 1.3* 1.2 1.6*  ALKPHOS 71 58 55  ALT 11* 10* 12*  AST 18 15 20   GLUCOSE 113* 97 131*     Imaging/Diagnostic Tests: Ct Head Wo Contrast  01/18/2016  CLINICAL DATA:  Altered mental status.  Agitation. EXAM: CT HEAD WITHOUT CONTRAST TECHNIQUE: Contiguous axial images were obtained from the base of the skull through the vertex without intravenous contrast.  COMPARISON:  09/06/2012 FINDINGS: The brain shows generalized atrophy. There chronic small-vessel ischemic changes affecting the cerebral hemispheric white matter. No sign of acute infarction, mass lesion, hemorrhage, hydrocephalus or extra-axial collection. The calvarium is unremarkable. Sinuses are clear. There is atherosclerotic calcification of the major vessels at the base of the brain. IMPRESSION: No acute finding by CT. Atrophy and chronic small-vessel disease of the cerebral hemispheric white matter. Electronically Signed   By: Nelson Chimes M.D.   On: 01/18/2016 10:13    Hudsyn Barich Cletis Media, MD 01/20/2016, 7:25 AM PGY-1, [place] Intern pager: [phone], text pages welcome

## 2016-01-21 ENCOUNTER — Encounter: Payer: Self-pay | Admitting: Family Medicine

## 2016-01-21 DIAGNOSIS — W19XXXA Unspecified fall, initial encounter: Secondary | ICD-10-CM

## 2016-01-21 DIAGNOSIS — R4182 Altered mental status, unspecified: Secondary | ICD-10-CM

## 2016-01-21 LAB — TYPE AND SCREEN
ABO/RH(D): A POS
ANTIBODY SCREEN: POSITIVE
DAT, IGG: NEGATIVE
UNIT DIVISION: 0
Unit division: 0

## 2016-01-23 ENCOUNTER — Telehealth: Payer: Self-pay | Admitting: Family Medicine

## 2016-01-23 NOTE — Telephone Encounter (Signed)
Daughter called. She doesn't know if the email will be addressed by another dr since Ree Kida is out this week. Her concern is the wound. It needs to be dressed ASAP.  Daughter cannot bring her in because pt is very weak.  Please advise

## 2016-01-23 NOTE — Telephone Encounter (Signed)
Spoke with [facility], orders will need to be written on rx and faxed to [facility] along with patient demographics to [phone]. Will forward to PCP and covering MD.

## 2016-01-23 NOTE — Telephone Encounter (Signed)
Note to RN staff - I was contacted by patient's daughter today via [place]. Apparently, patient was discharged from hospital this past Friday and does not have orders for PT/OT/Nursing from [facility]. Unfortunately, I am out of town which makes it difficult for me to address. Please call [facility] and see if they need orders? Thanks.  Dr. Wanda Plump

## 2016-01-23 NOTE — Telephone Encounter (Signed)
Daughter can be reached at  [phone]

## 2016-01-24 ENCOUNTER — Ambulatory Visit (INDEPENDENT_AMBULATORY_CARE_PROVIDER_SITE_OTHER): Payer: Medicare Other | Admitting: Family Medicine

## 2016-01-24 ENCOUNTER — Encounter: Payer: Self-pay | Admitting: Family Medicine

## 2016-01-24 VITALS — BP 121/92 | HR 56 | Temp 97.6°F | Wt 135.0 lb

## 2016-01-24 DIAGNOSIS — T148XXA Other injury of unspecified body region, initial encounter: Secondary | ICD-10-CM | POA: Insufficient documentation

## 2016-01-24 DIAGNOSIS — T148 Other injury of unspecified body region: Secondary | ICD-10-CM

## 2016-01-24 NOTE — Patient Instructions (Signed)
Thank you for coming in to clinic today.  1. Sorry for any inconveniences that you have had recently with recent hospitalization and issues with the home health wound care orders - Today we changed dressings and cleaned skin tear wounds with saline - I do not see any evidence of infection, and the red tissue is normal, the yellowish tissue on top is granulation tissue that is healing as well, it does need to be cleaned as we did today with saline and gauze. No other recommendations from [place] in hospital - We have contacted Dr Mingo Amber (covering for Dr Ree Kida) to order the [place] today, and this should be accomplished. I would check in with [facility] and our office tomorrow if you still have not heard back. - Recommend repeat dressing change within 48 to 72 hours  If she develops fevers/chills, or spreading redness, or worsening drainage or new symptoms, please return sooner for re-evaluation  Additionally, follow-up with Dr Ree Kida on 3/10 for Hospital Follow-up to discuss blood pressure and hospitalization  If you have any other questions or concerns, please feel free to call the clinic to contact me. You may also schedule an earlier appointment if necessary.  However, if your symptoms get significantly worse, please go to the Emergency Department to seek immediate medical attention.  Nobie Putnam, [place]

## 2016-01-24 NOTE — Telephone Encounter (Signed)
Please see separate note.  Orders completed this AM.

## 2016-01-24 NOTE — Telephone Encounter (Addendum)
I have completed this and will forward to the [place] to start therapy today if advanced home care can reach them.  Please call and let daughter know that orders have been placed.

## 2016-01-24 NOTE — Progress Notes (Signed)
Subjective:    Patient ID: Gina Wilkinson, female    DOB: 16-Jul-1920, 82 y.o.   MRN: [id number]  Gina Wilkinson is a 82 y.o. female presenting on 01/24/2016 for Wound on arms  Patient presents for a same day appointment. Accompanied by patient's daughter and her caregiver friend.  HPI   MULTIPLE SKIN TEARS S/p Fall / WOUND CARE: - Recent hospitalization at [facility] from 01/17/16 to 01/20/16, for altered mental status and fall at home following significant sedation and intolerance of Baclofen. See further details on hospital DC summary. - Presents for wound care and dressing change. She had hospital [place] visit for a full thickness skin tear on left forearm 4x4 cm, treated with silicone foam dressing, and advised future follow-up [facility]. Anticipated to follow-up with [facility] for wound care 5 days later, however patient and family report "there was no order or arrangements made", they were told by [facility] to come here today for wound care until orders received. - Today concerns about some "drainage" on left forearm skin tear, has not changed dressing or washed wound. No topicals or other treatments. X 2 dressings on Right upper extremity reportedly did not tear through the skin, but more superficial bruising. - Admits to easy bruising - Denies any recent fevers/chills, redness / rash, new injury or fall, nausea, vomiting, drainage of pus or bleeding  Social History  Substance Use Topics  . Smoking status: Never Smoker   . Smokeless tobacco: Never Used  . Alcohol Use: No    Review of Systems Per HPI unless specifically indicated above     Objective:    BP 121/92 mmHg  Pulse 56  Temp(Src) 97.6 F (36.4 C) (Oral)  Wt 135 lb (61.236 kg)  Wt Readings from Last 3 Encounters:  01/24/16 135 lb (61.236 kg)  01/20/16 134 lb 12.8 oz (61.145 kg)  10/07/15 135 lb 12.8 oz (61.598 kg)    Physical Exam  Constitutional: She is oriented to person, place, and time. She appears well-developed and  well-nourished. No distress.  Chronically ill but well appearing 82 yr female, comfortable, cooperative  HENT:  Head: Normocephalic and atraumatic.  Cardiovascular: Normal rate and intact distal pulses.   Pulmonary/Chest: Effort normal.  Musculoskeletal: Normal range of motion. She exhibits no edema or tenderness.  Neurological: She is alert and oriented to person, place, and time.  Skin: Skin is warm and dry. She is not diaphoretic.  Left Upper Extremity, forearm full thickness skin tear 4 x 4 cm with overlying partial skin flap, red moist tissue appears to be healing with some scant amount of overlying granulation debris. Mildly tender. No purulence or extending cellulitis.  Right Upper Extremity with x 2 dressings removed without any underlying skin tears or breaks in skin.  Extensive ecchymosis bilateral arms, various stages.  Nursing note and vitals reviewed.      Assessment & Plan:   Problem List Items Addressed This Visit    Multiple skin tears - Primary    Appropriately healing, Left forearm full thickness 4x4cm skin tear. No other skin tears active, only abrasions, ecchymosis on Right forearm.  Plan: 1. Removed all old dressings, replaced dressing on Left forearm with non-adhesive pad after gentle cleaning with saline soaked gauze 2. Followed up with Dr Mingo Amber to place orders for [facility], placed on 01/24/16 3. Wound care instructions given, follow-up with [facility]         No orders of the defined types were  placed in this encounter.      Follow up plan: Return in about 10 days (around 02/03/2016) for hospital follow-up.  Nobie Putnam, [place], PGY-3

## 2016-01-24 NOTE — Telephone Encounter (Signed)
Dr. Mingo Amber is currently covering for me. Please have him complete these orders.

## 2016-01-25 DIAGNOSIS — I4891 Unspecified atrial fibrillation: Secondary | ICD-10-CM | POA: Diagnosis not present

## 2016-01-25 DIAGNOSIS — I5032 Chronic diastolic (congestive) heart failure: Secondary | ICD-10-CM | POA: Diagnosis not present

## 2016-01-25 DIAGNOSIS — I350 Nonrheumatic aortic (valve) stenosis: Secondary | ICD-10-CM | POA: Diagnosis not present

## 2016-01-25 DIAGNOSIS — D649 Anemia, unspecified: Secondary | ICD-10-CM | POA: Diagnosis not present

## 2016-01-25 DIAGNOSIS — E039 Hypothyroidism, unspecified: Secondary | ICD-10-CM | POA: Diagnosis not present

## 2016-01-25 DIAGNOSIS — N189 Chronic kidney disease, unspecified: Secondary | ICD-10-CM | POA: Diagnosis not present

## 2016-01-25 DIAGNOSIS — S50912D Unspecified superficial injury of left forearm, subsequent encounter: Secondary | ICD-10-CM | POA: Diagnosis not present

## 2016-01-25 DIAGNOSIS — Z9181 History of falling: Secondary | ICD-10-CM | POA: Diagnosis not present

## 2016-01-25 DIAGNOSIS — I272 Other secondary pulmonary hypertension: Secondary | ICD-10-CM | POA: Diagnosis not present

## 2016-01-25 DIAGNOSIS — I129 Hypertensive chronic kidney disease with stage 1 through stage 4 chronic kidney disease, or unspecified chronic kidney disease: Secondary | ICD-10-CM | POA: Diagnosis not present

## 2016-01-25 NOTE — Assessment & Plan Note (Signed)
Appropriately healing, Left forearm full thickness 4x4cm skin tear. No other skin tears active, only abrasions, ecchymosis on Right forearm.  Plan: 1. Removed all old dressings, replaced dressing on Left forearm with non-adhesive pad after gentle cleaning with saline soaked gauze 2. Followed up with Dr Mingo Amber to place orders for [facility] for [facility], placed on 01/24/16 3. Wound care instructions given, follow-up with [facility]

## 2016-01-26 ENCOUNTER — Encounter (HOSPITAL_COMMUNITY): Payer: Self-pay | Admitting: *Deleted

## 2016-01-26 ENCOUNTER — Other Ambulatory Visit: Payer: Self-pay

## 2016-01-26 ENCOUNTER — Emergency Department (HOSPITAL_COMMUNITY)
Admission: EM | Admit: 2016-01-26 | Discharge: 2016-01-26 | Disposition: A | Discharge status: Home / Self Care | Payer: Medicare Other | Attending: Emergency Medicine | Admitting: Emergency Medicine

## 2016-01-26 ENCOUNTER — Emergency Department (HOSPITAL_COMMUNITY): Payer: Medicare Other

## 2016-01-26 DIAGNOSIS — D649 Anemia, unspecified: Secondary | ICD-10-CM | POA: Diagnosis not present

## 2016-01-26 DIAGNOSIS — I4891 Unspecified atrial fibrillation: Secondary | ICD-10-CM | POA: Diagnosis not present

## 2016-01-26 DIAGNOSIS — Z86018 Personal history of other benign neoplasm: Secondary | ICD-10-CM | POA: Diagnosis not present

## 2016-01-26 DIAGNOSIS — E039 Hypothyroidism, unspecified: Secondary | ICD-10-CM | POA: Diagnosis not present

## 2016-01-26 DIAGNOSIS — N184 Chronic kidney disease, stage 4 (severe): Secondary | ICD-10-CM | POA: Diagnosis not present

## 2016-01-26 DIAGNOSIS — Z8701 Personal history of pneumonia (recurrent): Secondary | ICD-10-CM | POA: Diagnosis not present

## 2016-01-26 DIAGNOSIS — I129 Hypertensive chronic kidney disease with stage 1 through stage 4 chronic kidney disease, or unspecified chronic kidney disease: Secondary | ICD-10-CM | POA: Insufficient documentation

## 2016-01-26 DIAGNOSIS — Z7982 Long term (current) use of aspirin: Secondary | ICD-10-CM | POA: Insufficient documentation

## 2016-01-26 DIAGNOSIS — I6789 Other cerebrovascular disease: Secondary | ICD-10-CM | POA: Diagnosis not present

## 2016-01-26 DIAGNOSIS — Z8709 Personal history of other diseases of the respiratory system: Secondary | ICD-10-CM | POA: Diagnosis not present

## 2016-01-26 DIAGNOSIS — N189 Chronic kidney disease, unspecified: Secondary | ICD-10-CM | POA: Diagnosis not present

## 2016-01-26 DIAGNOSIS — G459 Transient cerebral ischemic attack, unspecified: Secondary | ICD-10-CM

## 2016-01-26 DIAGNOSIS — I350 Nonrheumatic aortic (valve) stenosis: Secondary | ICD-10-CM | POA: Diagnosis not present

## 2016-01-26 DIAGNOSIS — Z880 Allergy status to penicillin: Secondary | ICD-10-CM | POA: Diagnosis not present

## 2016-01-26 DIAGNOSIS — R531 Weakness: Secondary | ICD-10-CM | POA: Diagnosis present

## 2016-01-26 DIAGNOSIS — Z79899 Other long term (current) drug therapy: Secondary | ICD-10-CM | POA: Insufficient documentation

## 2016-01-26 DIAGNOSIS — I5032 Chronic diastolic (congestive) heart failure: Secondary | ICD-10-CM | POA: Diagnosis not present

## 2016-01-26 DIAGNOSIS — R4781 Slurred speech: Secondary | ICD-10-CM | POA: Diagnosis not present

## 2016-01-26 DIAGNOSIS — S50912D Unspecified superficial injury of left forearm, subsequent encounter: Secondary | ICD-10-CM | POA: Diagnosis not present

## 2016-01-26 LAB — CBC WITH DIFFERENTIAL/PLATELET
Basophils Absolute: 0 10*3/uL (ref 0.0–0.1)
Basophils Relative: 1 %
EOS ABS: 0.2 10*3/uL (ref 0.0–0.7)
EOS PCT: 4 %
HCT: 30.2 % — ABNORMAL LOW (ref 36.0–46.0)
Hemoglobin: 9.8 g/dL — ABNORMAL LOW (ref 12.0–15.0)
LYMPHS ABS: 0.8 10*3/uL (ref 0.7–4.0)
LYMPHS PCT: 12 %
MCH: 31.1 pg (ref 26.0–34.0)
MCHC: 32.5 g/dL (ref 30.0–36.0)
MCV: 95.9 fL (ref 78.0–100.0)
MONOS PCT: 10 %
Monocytes Absolute: 0.6 10*3/uL (ref 0.1–1.0)
Neutro Abs: 4.5 10*3/uL (ref 1.7–7.7)
Neutrophils Relative %: 73 %
PLATELETS: 275 10*3/uL (ref 150–400)
RBC: 3.15 MIL/uL — ABNORMAL LOW (ref 3.87–5.11)
RDW: 16 % — ABNORMAL HIGH (ref 11.5–15.5)
WBC: 6.2 10*3/uL (ref 4.0–10.5)

## 2016-01-26 LAB — URINALYSIS, ROUTINE W REFLEX MICROSCOPIC
BILIRUBIN URINE: NEGATIVE
Glucose, UA: NEGATIVE mg/dL
HGB URINE DIPSTICK: NEGATIVE
KETONES UR: NEGATIVE mg/dL
NITRITE: NEGATIVE
PH: 5.5 (ref 5.0–8.0)
Protein, ur: NEGATIVE mg/dL
Specific Gravity, Urine: 1.017 (ref 1.005–1.030)

## 2016-01-26 LAB — COMPREHENSIVE METABOLIC PANEL
ALK PHOS: 65 U/L (ref 38–126)
ALT: 12 U/L — ABNORMAL LOW (ref 14–54)
ANION GAP: 12 (ref 5–15)
AST: 19 U/L (ref 15–41)
Albumin: 3.5 g/dL (ref 3.5–5.0)
BUN: 28 mg/dL — ABNORMAL HIGH (ref 6–20)
CALCIUM: 9.9 mg/dL (ref 8.9–10.3)
CO2: 25 mmol/L (ref 22–32)
CREATININE: 1.51 mg/dL — AB (ref 0.44–1.00)
Chloride: 105 mmol/L (ref 101–111)
GFR, EST AFRICAN AMERICAN: 33 mL/min — AB (ref >60)
GFR, EST NON AFRICAN AMERICAN: 28 mL/min — AB (ref >60)
GLUCOSE: 88 mg/dL (ref 65–99)
Potassium: 4.7 mmol/L (ref 3.5–5.1)
SODIUM: 142 mmol/L (ref 135–145)
Total Bilirubin: 0.9 mg/dL (ref 0.3–1.2)
Total Protein: 7.3 g/dL (ref 6.5–8.1)

## 2016-01-26 LAB — URINE MICROSCOPIC-ADD ON

## 2016-01-26 NOTE — ED Provider Notes (Signed)
CSN: [id number]     Arrival date & time 01/26/16  1158 History   First MD Initiated Contact with Patient 01/26/16 1208     Chief Complaint  Patient presents with  . Weakness     (Consider location/radiation/quality/duration/timing/severity/associated sxs/prior Treatment) HPI Patient has a history of chronic A. fib for which she is not on anticoagulation secondary to myelodysplastic syndrome.  She presents to the emergency department with complaints of slurred speech and right-sided weakness upon awakening this morning.  Family and caretakers state that she woke up this way and continued until shortly before arrival to the emergency department.  On arrival to emergency department the patient had resolution of all of her symptoms and is now returned to baseline mental status.  Patient denies weakness of her arms or legs.  Family reports that her speech is returned to normal.  No prior history of stroke.  She does have a history of aortic stenosis, pulmonary hypertension, chronic A. fib, myelodysplastic syndrome.  She denies fevers or chills.  No recent illness.  No recent cough or congestion.  Denies nausea vomiting or diarrhea.  No chest pain at this time.   Past Medical History  Diagnosis Date  . MDS (myelodysplastic syndrome) ([place]) 10/05/2011  . Pneumonia   . Hypertension   . Permanent atrial fibrillation ([place]) dx Mar. 2010    a. Not on anticoag due to myelodysplastic syndrome.  Marland Kitchen RBBB   . Pulmonary hypertension ([place])   . Chronic diastolic CHF (congestive heart failure) ([place])   . Hypothyroidism   . Aortic stenosis   . Acute respiratory failure with hypoxia ([place])   . Anemia, unspecified 07/27/2014  . Mild cognitive impairment   . Moderate to severe pulmonary hypertension ([place])   . Chronic respiratory failure ([facility])     a. on home O2 qhs and PRN.  . CKD (chronic kidney disease), stage IV ([place])     a. per review of historical labs.   Past Surgical History  Procedure Laterality Date  .  Orif femur fracture  2001    left  . Appendectomy    . Orif periprosthetic fracture Left 11/12/2013    Procedure: OPEN REDUCTION INTERNAL FIXATION (ORIF) LEFT HIP PERIPROSTHETIC FRACTURE,;  Surgeon: Marianna Payment, MD;  Location: [facility];  Service: Orthopedics;  Laterality: Left;   Family History  Problem Relation Age of Onset  . Appendicitis Mother     Died age 41s-60s of ruptured appendix   Social History  Substance Use Topics  . Smoking status: Never Smoker   . Smokeless tobacco: Never Used  . Alcohol Use: No   OB History    No data available     Review of Systems  All other systems reviewed and are negative.     Allergies  Levaquin; Penicillins; Sulfonamide derivatives; and Baclofen  Home Medications   Prior to Admission medications   Medication Sig Start Date End Date Taking? Authorizing Provider  amLODipine (NORVASC) 5 MG tablet Take 1 tablet (5 mg total) by mouth daily. 01/20/16  Yes Asiyah Cletis Media, MD  aspirin 81 MG tablet Take 81 mg by mouth daily.   Yes Historical Provider, MD  Cholecalciferol (VITAMIN D3) 3000 UNITS TABS Take 3,000 Units by mouth daily.    Yes Historical Provider, MD  ferrous sulfate 325 (65 FE) MG tablet Take 325 mg by mouth 2 (two) times a week. Mondays and Thursdays.   Yes Historical Provider, MD  NON FORMULARY Oxygen 2 Liters every night and as  needed   Yes Historical Provider, MD  SYNTHROID 112 MCG tablet TAKE 1 TABLET (112 MCG TOTAL) BY MOUTH DAILY. CANNOT BE GENERIC. MUST BE SYNTHROID. (WILL PAY 7/26) 09/28/15  Yes Lupita Dawn, MD  vitamin B-12 (CYANOCOBALAMIN) 250 MCG tablet Take 250 mcg by mouth daily.   Yes Historical Provider, MD   BP 137/54 mmHg  Pulse 97  Temp(Src) 98.3 F (36.8 C) (Oral)  Resp 14  SpO2 91% Physical Exam  Constitutional: She is oriented to person, place, and time. She appears well-developed and well-nourished. No distress.  HENT:  Head: Normocephalic and atraumatic.  Eyes: EOM are normal. Pupils are  equal, round, and reactive to light.  Neck: Normal range of motion.  Cardiovascular: Normal rate, regular rhythm and normal heart sounds.   Pulmonary/Chest: Effort normal and breath sounds normal.  Abdominal: Soft. She exhibits no distension. There is no tenderness.  Musculoskeletal: Normal range of motion.  Neurological: She is alert and oriented to person, place, and time.  5/5 strength in major muscle groups of  bilateral upper and lower extremities. Speech normal. No facial asymetry.   Skin: Skin is warm and dry.  Psychiatric: She has a normal mood and affect. Judgment normal.  Nursing note and vitals reviewed.   ED Course  Procedures (including critical care time) Labs Review Labs Reviewed  CBC WITH DIFFERENTIAL/PLATELET - Abnormal; Notable for the following:    RBC 3.15 (*)    Hemoglobin 9.8 (*)    HCT 30.2 (*)    RDW 16.0 (*)    All other components within normal limits  COMPREHENSIVE METABOLIC PANEL - Abnormal; Notable for the following:    BUN 28 (*)    Creatinine, Ser 1.51 (*)    ALT 12 (*)    GFR calc non Af Amer 28 (*)    GFR calc Af Amer 33 (*)    All other components within normal limits  URINALYSIS, ROUTINE W REFLEX MICROSCOPIC (NOT AT [facility]) - Abnormal; Notable for the following:    APPearance CLOUDY (*)    Leukocytes, UA SMALL (*)    All other components within normal limits  URINE MICROSCOPIC-ADD ON - Abnormal; Notable for the following:    Squamous Epithelial / LPF 0-5 (*)    Bacteria, UA MANY (*)    All other components within normal limits    Imaging Review Ct Head Wo Contrast  01/26/2016  CLINICAL DATA:  LEFT-sided weakness and slurred speech beginning this morning, history of myelodysplastic syndrome, hypertension, atrial fibrillation, chronic diastolic CHF, moderate to severe pulmonary hypertension, stage IV chronic kidney disease EXAM: CT HEAD WITHOUT CONTRAST TECHNIQUE: Contiguous axial images were obtained from the base of the skull through the  vertex without intravenous contrast. COMPARISON:  01/18/2016 FINDINGS: Minimal scattered motion artifacts. Generalized atrophy. Normal ventricular morphology. No midline shift or mass effect. Small vessel chronic ischemic changes of deep cerebral white matter. No intracranial hemorrhage, mass lesion or evidence of acute infarction. No extra-axial fluid collections. Paranasal sinuses and mastoid air cells clear. Bones demineralized without acute abnormality. Atherosclerotic calcifications of the carotid siphons bilaterally. IMPRESSION: Atrophy with small vessel chronic ischemic changes of deep cerebral white matter. No acute intracranial abnormalities. Electronically Signed   By: Lavonia Dana M.D.   On: 01/26/2016 13:03   I have personally reviewed and evaluated these images and lab results as part of my medical decision-making.   EKG Interpretation   Date/Time:  Thursday January 26 2016 12:04:54 EST Ventricular Rate:  50 PR Interval:  QRS Duration: 158 QT Interval:  477 QTC Calculation: 435 R Axis:   117 Text Interpretation:  Atrial fibrillation Right bundle branch block  Lateral infarct, old No significant change was found Confirmed by Kanai Berrios   MD, Lennette Bihari ([id number]) on 01/26/2016 3:50:52 PM      MDM   Final diagnoses:  Transient cerebral ischemia, unspecified transient cerebral ischemia type    Clinically the patient sounds as though she had a TIA.  She's had resolution of all her symptoms.  She has chronic A. fib but is not a candidate for anticoagulation.  Family understands that this was a TIA and represents a neurologic issue.  I do not think the patient is to be admitted the hospital at this time is she's had complete resolution of her symptoms and given the current influenza outbreak I feels though putting the elderly patient in the hospital will lead to more wrist that will benefit.  I long discussion with the patient and the family all of home agree.  They will follow up closely with the  primary care physician.  They understand to return to the ER for new or worsening symptoms.  They understand that she is at increased risk for stroke in the next 30 days    Jola Schmidt, MD 01/26/16 1553

## 2016-01-26 NOTE — Discharge Instructions (Signed)
Transient Ischemic Attack °A transient ischemic attack (TIA) is a "warning stroke" that causes stroke-like symptoms. Unlike a stroke, a TIA does not cause permanent damage to the brain. The symptoms of a TIA can happen very fast and do not last long. It is important to know the symptoms of a TIA and what to do. This can help prevent a major stroke or death. °CAUSES  °A TIA is caused by a temporary blockage in an artery in the brain or neck (carotid artery). The blockage does not allow the brain to get the blood supply it needs and can cause different symptoms. The blockage can be caused by either: °· A blood clot. °· Fatty buildup (plaque) in a neck or brain artery. °RISK FACTORS °· High blood pressure (hypertension). °· High cholesterol. °· Diabetes mellitus. °· Heart disease. °· The buildup of plaque in the blood vessels (peripheral artery disease or atherosclerosis). °· The buildup of plaque in the blood vessels that provide blood and oxygen to the brain (carotid artery stenosis). °· An abnormal heart rhythm (atrial fibrillation). °· Obesity. °· Using any tobacco products, including cigarettes, chewing tobacco, or electronic cigarettes. °· Taking oral contraceptives, especially in combination with using tobacco. °· Physical inactivity. °· A diet high in fats, salt (sodium), and calories. °· Excessive alcohol use. °· Use of illegal drugs (especially cocaine and methamphetamine). °· Being female. °· Being African American. °· Being over the age of 55 years. °· Family history of stroke. °· Previous history of blood clots, stroke, TIA, or heart attack. °· Sickle cell disease. °SIGNS AND SYMPTOMS  °TIA symptoms are the same as a stroke but are temporary. These symptoms usually develop suddenly, or may be newly present upon waking from sleep: °· Sudden weakness or numbness of the face, arm, or leg, especially on one side of the body. °· Sudden trouble walking or difficulty moving arms or legs. °· Sudden  confusion. °· Sudden personality changes. °· Trouble speaking (aphasia) or understanding. °· Difficulty swallowing. °· Sudden trouble seeing in one or both eyes. °· Double vision. °· Dizziness. °· Loss of balance or coordination. °· Sudden severe headache with no known cause. °· Trouble reading or writing. °· Loss of bowel or bladder control. °· Loss of consciousness. °DIAGNOSIS  °Your health care provider may be able to determine the presence or absence of a TIA based on your symptoms, history, and physical exam. CT scan of the brain is usually performed to help identify a TIA. Other tests may include: °· Electrocardiography (ECG). °· Continuous heart monitoring. °· Echocardiography. °· Carotid ultrasonography. °· MRI. °· A scan of the brain circulation. °· Blood tests. °TREATMENT  °Since the symptoms of TIA are the same as a stroke, it is important to seek treatment as soon as possible. You may need a medicine to dissolve a blood clot (thrombolytic) if that is the cause of the TIA. This medicine cannot be given if too much time has passed. Treatment may also include:  °· Rest, oxygen, fluids through an IV tube, and medicines to thin the blood (anticoagulants). °· Measures will be taken to prevent short-term and long-term complications, including infection from breathing foreign material into the lungs (aspiration pneumonia), blood clots in the legs, and falls. °· Procedures to either remove plaque in the carotid arteries or dilate carotid arteries that have narrowed due to plaque. Those procedures are: °¨ Carotid endarterectomy. °¨ Carotid angioplasty and stenting. °· Medicines and diet may be used to address diabetes, high blood pressure, and   other underlying risk factors. °HOME CARE INSTRUCTIONS  °· Take medicines only as directed by your health care provider. Follow the directions carefully. Medicines may be used to control risk factors for a stroke. Be sure you understand all your medicine instructions. °· You  may be told to take aspirin or the anticoagulant warfarin. Warfarin needs to be taken exactly as instructed. °¨ Taking too much or too little warfarin is dangerous. Too much warfarin increases the risk of bleeding. Too little warfarin continues to allow the risk for blood clots. While taking warfarin, you will need to have regular blood tests to measure your blood clotting time. A PT blood test measures how long it takes for blood to clot. Your PT is used to calculate another value called an INR. Your PT and INR help your health care provider to adjust your dose of warfarin. The dose can change for many reasons. It is critically important that you take warfarin exactly as prescribed. °¨ Many foods, especially foods high in vitamin K can interfere with warfarin and affect the PT and INR. Foods high in vitamin K include spinach, kale, broccoli, cabbage, collard and turnip greens, Brussels sprouts, peas, cauliflower, seaweed, and parsley, as well as beef and pork liver, green tea, and soybean oil. You should eat a consistent amount of foods high in vitamin K. Avoid major changes in your diet, or notify your health care provider before changing your diet. Arrange a visit with a dietitian to answer your questions. °¨ Many medicines can interfere with warfarin and affect the PT and INR. You must tell your health care provider about any and all medicines you take; this includes all vitamins and supplements. Be especially cautious with aspirin and anti-inflammatory medicines. Do not take or discontinue any prescribed or over-the-counter medicine except on the advice of your health care provider or pharmacist. °¨ Warfarin can have side effects, such as excessive bruising or bleeding. You will need to hold pressure over cuts for longer than usual. Your health care provider or pharmacist will discuss other potential side effects. °¨ Avoid sports or activities that may cause injury or bleeding. °¨ Be careful when shaving,  flossing your teeth, or handling sharp objects. °¨ Alcohol can change the body's ability to handle warfarin. It is best to avoid alcoholic drinks or consume only very small amounts while taking warfarin. Notify your health care provider if you change your alcohol intake. °¨ Notify your dentist or other health care providers before procedures. °· Eat a diet that includes 5 or more servings of fruits and vegetables each day. This may reduce the risk of stroke. Certain diets may be prescribed to address high blood pressure, high cholesterol, diabetes, or obesity. °¨ A diet low in sodium, saturated fat, trans fat, and cholesterol is recommended to manage high blood pressure. °¨ A diet low in saturated fat, trans fat, and cholesterol, and high in fiber may control cholesterol levels. °¨ A controlled-carbohydrate, controlled-sugar diet is recommended to manage diabetes. °¨ A reduced-calorie diet that is low in sodium, saturated fat, trans fat, and cholesterol is recommended to manage obesity. °· Maintain a healthy weight. °· Stay physically active. It is recommended that you get at least 30 minutes of activity on most or all days. °· Do not use any tobacco products, including cigarettes, chewing tobacco, or electronic cigarettes. If you need help quitting, ask your health care provider. °· Limit alcohol intake to no more than 1 drink per day for nonpregnant women and 2 drinks   per day for men. One drink equals 12 ounces of beer, 5 ounces of wine, or 1½ ounces of hard liquor. °· Do not abuse drugs. °· A safe home environment is important to reduce the risk of falls. Your health care provider may arrange for specialists to evaluate your home. Having grab bars in the bedroom and bathroom is often important. Your health care provider may arrange for equipment to be used at home, such as raised toilets and a seat for the shower. °· Follow all instructions for follow-up with your health care provider. This is very important.  This includes any referrals and lab tests. Proper follow-up can prevent a stroke or another TIA from occurring. °PREVENTION  °The risk of a TIA can be decreased by appropriately treating high blood pressure, high cholesterol, diabetes, heart disease, and obesity, and by quitting smoking, limiting alcohol, and staying physically active. °SEEK MEDICAL CARE IF: °· You have personality changes. °· You have difficulty swallowing. °· You are seeing double. °· You have dizziness. °· You have a fever. °SEEK IMMEDIATE MEDICAL CARE IF:  °Any of the following symptoms may represent a serious problem that is an emergency. Do not wait to see if the symptoms will go away. Get medical help right away. Call your local emergency services (911 in [place] Do not drive yourself to the hospital. °· You have sudden weakness or numbness of the face, arm, or leg, especially on one side of the body. °· You have sudden trouble walking or difficulty moving arms or legs. °· You have sudden confusion. °· You have trouble speaking (aphasia) or understanding. °· You have sudden trouble seeing in one or both eyes. °· You have a loss of balance or coordination. °· You have a sudden, severe headache with no known cause. °· You have new chest pain or an irregular heartbeat. °· You have a partial or total loss of consciousness. °MAKE SURE YOU:  °· Understand these instructions. °· Will watch your condition. °· Will get help right away if you are not doing well or get worse. °  °This information is not intended to replace advice given to you by your health care provider. Make sure you discuss any questions you have with your health care provider. °  °Document Released: 08/22/2005 Document Revised: 12/03/2014 Document Reviewed: 02/17/2014 °Elsevier Interactive Patient Education ©2016 [employer] ° °

## 2016-01-26 NOTE — ED Notes (Signed)
Per [facility] - pt from home, per home health nurse, pt was still in the bed this morning on her arrival which is abnormal for the pt, while attempting to ambulate the pt exhibited to left sided weakness and difficulty w/ mobility. Per Pt's family, pt noted to have some slurred speech - no facial droop, grip strengths equal per EMS. Pts LSN was yesterday evening 11pm.

## 2016-01-26 NOTE — ED Notes (Signed)
Pt stable, states understanding of discharge instructions 

## 2016-01-26 NOTE — Telephone Encounter (Signed)
Patient being transported to [facility] ER for ?per daughter.

## 2016-01-26 NOTE — ED Notes (Signed)
Patient transported to CT 

## 2016-01-27 DIAGNOSIS — I4891 Unspecified atrial fibrillation: Secondary | ICD-10-CM | POA: Diagnosis not present

## 2016-01-27 DIAGNOSIS — S50912D Unspecified superficial injury of left forearm, subsequent encounter: Secondary | ICD-10-CM | POA: Diagnosis not present

## 2016-01-27 DIAGNOSIS — I129 Hypertensive chronic kidney disease with stage 1 through stage 4 chronic kidney disease, or unspecified chronic kidney disease: Secondary | ICD-10-CM | POA: Diagnosis not present

## 2016-01-27 DIAGNOSIS — I5032 Chronic diastolic (congestive) heart failure: Secondary | ICD-10-CM | POA: Diagnosis not present

## 2016-01-27 DIAGNOSIS — I350 Nonrheumatic aortic (valve) stenosis: Secondary | ICD-10-CM | POA: Diagnosis not present

## 2016-01-27 DIAGNOSIS — N189 Chronic kidney disease, unspecified: Secondary | ICD-10-CM | POA: Diagnosis not present

## 2016-01-29 ENCOUNTER — Encounter: Payer: Self-pay | Admitting: Family Medicine

## 2016-01-29 DIAGNOSIS — I4891 Unspecified atrial fibrillation: Secondary | ICD-10-CM | POA: Diagnosis not present

## 2016-01-29 DIAGNOSIS — I129 Hypertensive chronic kidney disease with stage 1 through stage 4 chronic kidney disease, or unspecified chronic kidney disease: Secondary | ICD-10-CM | POA: Diagnosis not present

## 2016-01-29 DIAGNOSIS — I350 Nonrheumatic aortic (valve) stenosis: Secondary | ICD-10-CM | POA: Diagnosis not present

## 2016-01-29 DIAGNOSIS — I5032 Chronic diastolic (congestive) heart failure: Secondary | ICD-10-CM | POA: Diagnosis not present

## 2016-01-29 DIAGNOSIS — S50912D Unspecified superficial injury of left forearm, subsequent encounter: Secondary | ICD-10-CM | POA: Diagnosis not present

## 2016-01-29 DIAGNOSIS — N189 Chronic kidney disease, unspecified: Secondary | ICD-10-CM | POA: Diagnosis not present

## 2016-01-30 DIAGNOSIS — N189 Chronic kidney disease, unspecified: Secondary | ICD-10-CM | POA: Diagnosis not present

## 2016-01-30 DIAGNOSIS — I4891 Unspecified atrial fibrillation: Secondary | ICD-10-CM | POA: Diagnosis not present

## 2016-01-30 DIAGNOSIS — I5032 Chronic diastolic (congestive) heart failure: Secondary | ICD-10-CM | POA: Diagnosis not present

## 2016-01-30 DIAGNOSIS — I350 Nonrheumatic aortic (valve) stenosis: Secondary | ICD-10-CM | POA: Diagnosis not present

## 2016-01-30 DIAGNOSIS — I129 Hypertensive chronic kidney disease with stage 1 through stage 4 chronic kidney disease, or unspecified chronic kidney disease: Secondary | ICD-10-CM | POA: Diagnosis not present

## 2016-01-30 DIAGNOSIS — S50912D Unspecified superficial injury of left forearm, subsequent encounter: Secondary | ICD-10-CM | POA: Diagnosis not present

## 2016-01-31 DIAGNOSIS — I350 Nonrheumatic aortic (valve) stenosis: Secondary | ICD-10-CM | POA: Diagnosis not present

## 2016-01-31 DIAGNOSIS — I5032 Chronic diastolic (congestive) heart failure: Secondary | ICD-10-CM | POA: Diagnosis not present

## 2016-01-31 DIAGNOSIS — S50912D Unspecified superficial injury of left forearm, subsequent encounter: Secondary | ICD-10-CM | POA: Diagnosis not present

## 2016-01-31 DIAGNOSIS — I129 Hypertensive chronic kidney disease with stage 1 through stage 4 chronic kidney disease, or unspecified chronic kidney disease: Secondary | ICD-10-CM | POA: Diagnosis not present

## 2016-01-31 DIAGNOSIS — I4891 Unspecified atrial fibrillation: Secondary | ICD-10-CM | POA: Diagnosis not present

## 2016-01-31 DIAGNOSIS — N189 Chronic kidney disease, unspecified: Secondary | ICD-10-CM | POA: Diagnosis not present

## 2016-02-01 DIAGNOSIS — I129 Hypertensive chronic kidney disease with stage 1 through stage 4 chronic kidney disease, or unspecified chronic kidney disease: Secondary | ICD-10-CM | POA: Diagnosis not present

## 2016-02-01 DIAGNOSIS — N189 Chronic kidney disease, unspecified: Secondary | ICD-10-CM | POA: Diagnosis not present

## 2016-02-01 DIAGNOSIS — I4891 Unspecified atrial fibrillation: Secondary | ICD-10-CM | POA: Diagnosis not present

## 2016-02-01 DIAGNOSIS — I350 Nonrheumatic aortic (valve) stenosis: Secondary | ICD-10-CM | POA: Diagnosis not present

## 2016-02-01 DIAGNOSIS — I5032 Chronic diastolic (congestive) heart failure: Secondary | ICD-10-CM | POA: Diagnosis not present

## 2016-02-01 DIAGNOSIS — S50912D Unspecified superficial injury of left forearm, subsequent encounter: Secondary | ICD-10-CM | POA: Diagnosis not present

## 2016-02-01 NOTE — Progress Notes (Signed)
OT NOTE- LATE GCODE ENTRY     01/21/16 1300  OT G-codes **NOT FOR INPATIENT CLASS**  Functional Assessment Tool Used clinical judgement  Functional Limitation Self care  Self Care Current Status [id number]) CK  Self Care Goal Status [id number]) CK  Self Care Discharge Status [phone]) CK         Jeri Modena   OTR/L Pager: [phone] Office: [phone] .

## 2016-02-02 ENCOUNTER — Telehealth: Payer: Self-pay | Admitting: Family Medicine

## 2016-02-02 DIAGNOSIS — I5032 Chronic diastolic (congestive) heart failure: Secondary | ICD-10-CM | POA: Diagnosis not present

## 2016-02-02 DIAGNOSIS — I350 Nonrheumatic aortic (valve) stenosis: Secondary | ICD-10-CM | POA: Diagnosis not present

## 2016-02-02 DIAGNOSIS — S50912D Unspecified superficial injury of left forearm, subsequent encounter: Secondary | ICD-10-CM | POA: Diagnosis not present

## 2016-02-02 DIAGNOSIS — I4891 Unspecified atrial fibrillation: Secondary | ICD-10-CM | POA: Diagnosis not present

## 2016-02-02 DIAGNOSIS — N189 Chronic kidney disease, unspecified: Secondary | ICD-10-CM | POA: Diagnosis not present

## 2016-02-02 DIAGNOSIS — I129 Hypertensive chronic kidney disease with stage 1 through stage 4 chronic kidney disease, or unspecified chronic kidney disease: Secondary | ICD-10-CM | POA: Diagnosis not present

## 2016-02-02 NOTE — Telephone Encounter (Signed)
Please call Diane back at [phone] to give verbal orders for occupational therapy 2 times weekly for 2ks then once weely for one week

## 2016-02-02 NOTE — Telephone Encounter (Signed)
Forwarding to PCP.

## 2016-02-03 ENCOUNTER — Encounter: Payer: Self-pay | Admitting: Family Medicine

## 2016-02-03 ENCOUNTER — Ambulatory Visit (INDEPENDENT_AMBULATORY_CARE_PROVIDER_SITE_OTHER): Payer: Medicare Other | Admitting: Family Medicine

## 2016-02-03 VITALS — BP 137/62 | HR 56 | Temp 98.0°F | Ht 66.0 in | Wt 135.3 lb

## 2016-02-03 DIAGNOSIS — I5032 Chronic diastolic (congestive) heart failure: Secondary | ICD-10-CM

## 2016-02-03 DIAGNOSIS — N189 Chronic kidney disease, unspecified: Secondary | ICD-10-CM | POA: Diagnosis not present

## 2016-02-03 DIAGNOSIS — T148XXA Other injury of unspecified body region, initial encounter: Secondary | ICD-10-CM

## 2016-02-03 DIAGNOSIS — G9340 Encephalopathy, unspecified: Secondary | ICD-10-CM

## 2016-02-03 DIAGNOSIS — T148 Other injury of unspecified body region: Secondary | ICD-10-CM | POA: Diagnosis not present

## 2016-02-03 DIAGNOSIS — I482 Chronic atrial fibrillation: Secondary | ICD-10-CM | POA: Diagnosis not present

## 2016-02-03 DIAGNOSIS — I4821 Permanent atrial fibrillation: Secondary | ICD-10-CM

## 2016-02-03 DIAGNOSIS — I129 Hypertensive chronic kidney disease with stage 1 through stage 4 chronic kidney disease, or unspecified chronic kidney disease: Secondary | ICD-10-CM | POA: Diagnosis not present

## 2016-02-03 DIAGNOSIS — I10 Essential (primary) hypertension: Secondary | ICD-10-CM | POA: Diagnosis not present

## 2016-02-03 DIAGNOSIS — I350 Nonrheumatic aortic (valve) stenosis: Secondary | ICD-10-CM

## 2016-02-03 DIAGNOSIS — I4891 Unspecified atrial fibrillation: Secondary | ICD-10-CM | POA: Diagnosis not present

## 2016-02-03 DIAGNOSIS — S50912D Unspecified superficial injury of left forearm, subsequent encounter: Secondary | ICD-10-CM | POA: Diagnosis not present

## 2016-02-03 HISTORY — DX: Encephalopathy, unspecified: G93.40

## 2016-02-03 MED ORDER — FUROSEMIDE 20 MG PO TABS: 10.0000 mg | ORAL_TABLET | Freq: Every day | ORAL | Status: DC

## 2016-02-03 NOTE — Telephone Encounter (Signed)
Forwarding to PCP.

## 2016-02-03 NOTE — Assessment & Plan Note (Signed)
Rate controlled. Off Lopressor due to bradycardia. Not a candidate for anticoagulation due to high bleeding risk.  -monitor clinically

## 2016-02-03 NOTE — Assessment & Plan Note (Signed)
Skin tear of left forearm is gradually improving. No signs of infection. -continue local wound care.

## 2016-02-03 NOTE — Assessment & Plan Note (Signed)
Patient with respiratory symptoms and leg swelling likely secondary to multiple cardiac diagnosis (AS/Diastolic CHF/Pulm HTN). -restart lasix 10 mg daily -return for follow up in 2 weeks

## 2016-02-03 NOTE — Assessment & Plan Note (Signed)
Severe stenosis based on Echo completed in 12/2015. Not a candidate for valve replacement per Vascular Surgery. -monitor clinically

## 2016-02-03 NOTE — Progress Notes (Signed)
Subjective:    Patient ID: Gina Wilkinson, female    DOB: [birth year], 82 y.o.   MRN: [id number]  HPI 82 y/o female presents for hospital followup. Rosaria Ferries (caregiver) and dauther [name] in attendance. Admitted 01/17/16 - 01/20/16 with Encephalopathy.   Encephalopathy/AMS - patient returned to baseline prior to discharge, thought to be secondary to Baclofen, caregiver and daughter note a slow gradual decline in mental function over the past few months, recently seen in ED for slurred speech/confustion (see below)  HTN - stopped Metoprolol, replaced with Norvasc 5 mg, increased swelling in the feet, no chest pain.  Diastolic Heart Failure/Aortic Stenosis/Pulmonary HTN/Afib - Not a candidate for anticoagulation per Cardiology. Not a candidate for AS repair per Vascular surgery (seen during recent hospitalization). Lasix stopped at discharge, has noticed increased swelling and shortness of breath, worse in AM and with ambulation, improved in the afternoon, coughing without sputum production  ED visit on 3/2 - Reviewed ED note, seen for weakness of right side and slurred speech, thought to have TIA: no further symptoms other than some confusion. CT negative for acute stroke. No MRI completed.   Left arm wound - had wound care in the hospital. Wound care coming 3 times per week. Gradually improving.   Social - PT and OT has been coming to home.    Review of Systems  Constitutional: Negative for fever, chills and fatigue.  Respiratory: Positive for shortness of breath. Negative for chest tightness.   Cardiovascular: Positive for leg swelling. Negative for chest pain.  Gastrointestinal: Negative for nausea, vomiting and diarrhea.       Objective:   Physical Exam BP 137/62 mmHg  Pulse 56  Temp(Src) 98 F (36.7 C) (Oral)  Ht 5\' 6"  (1.676 m)  Wt 135 lb 4.8 oz (61.372 kg)  BMI 21.85 kg/m2  SpO2 88%  Gen: pleasant female, NAD Cardiac: Irregular Irregular, S1 and S2 present, 3/6 systolic  murmur best heard over right 2nd intercostal space Resp: Normal work of breathing, crackles at bilateral bases Skin: 3.5 cm by 1 cm open wounds, minimal surrounding erythema, no drainage  Reviewed CT head from 01/18/16 and 01/26/16. Both showed chronic small vessel changes without acute CVA.      Assessment & Plan:  Essential hypertension Controlled on Amlodipine. However patient does have some swelling. -restart Lasix (see other A/P problems) -continue Norvasc however consider stopping at future visits if swelling remains  Chronic diastolic heart failure Patient with respiratory symptoms and leg swelling likely secondary to multiple cardiac diagnosis (AS/Diastolic CHF/Pulm HTN). -restart lasix 10 mg daily -return for follow up in 2 weeks  Aortic stenosis Severe stenosis based on Echo completed in 12/2015. Not a candidate for valve replacement per Vascular Surgery. -monitor clinically  Permanent atrial fibrillation (HCC) Rate controlled. Off Lopressor due to bradycardia. Not a candidate for anticoagulation due to high bleeding risk.  -monitor clinically  Encephalopathy Hospital follow up for encephalopathy. Thought to be secondary to Baclofen. Symptoms resolved once medication stopped however patient has had slow cognitive decline over the past 6 months. Seen in ED on 01/26/16 and diagnosed with presumptive TIA. Clinically I suspect that the patient may have had/is having intermittent small infarcts and this is causing slow decline.  -discussed treatment options with patient's daughter [name] (patient unable to make decisions at this time). Discussed the risks/benefits of therapy. Will hold on anticoagulation/statin at this time. Continue baby aspirin daily.  Multiple skin tears Skin tear of left forearm is gradually improving. No signs  of infection. -continue local wound care.

## 2016-02-03 NOTE — Telephone Encounter (Signed)
Contacted OT. Gave verbal order for treatment 2x per week for 2 weeks then 1x per week for one week.

## 2016-02-03 NOTE — Assessment & Plan Note (Addendum)
Hospital follow up for encephalopathy. Thought to be secondary to Baclofen. Symptoms resolved once medication stopped however patient has had slow cognitive decline over the past 6 months. Seen in ED on 01/26/16 and diagnosed with presumptive TIA. Clinically I suspect that the patient may have had/is having intermittent small infarcts and this is causing slow decline.  -discussed treatment options with patient's daughter [name] (patient unable to make decisions at this time). Discussed the risks/benefits of therapy. Will hold on anticoagulation/statin at this time. Continue baby aspirin daily.

## 2016-02-03 NOTE — Patient Instructions (Signed)
It was nice to see today.   Blood Pressure - controlled, continue Norvasc 5 mg daily  Leg swelling - likely due to heart failure and Norvasc, start lasix 10 mg daily  Confusion - likely a sequelae of her TIA, will continue to monitor  Afib - currently in Afib but rate is controlled, Not a candidate for a blood thinner, continue daily aspirin  Left arm wound - change dressing every 1-2 days  Follow up in 1-2 weeks.

## 2016-02-03 NOTE — Assessment & Plan Note (Signed)
Controlled on Amlodipine. However patient does have some swelling. -restart Lasix (see other A/P problems) -continue Norvasc however consider stopping at future visits if swelling remains

## 2016-02-06 NOTE — Telephone Encounter (Signed)
Dr Ree Kida your first available isnt until 3/30

## 2016-02-07 DIAGNOSIS — I5032 Chronic diastolic (congestive) heart failure: Secondary | ICD-10-CM | POA: Diagnosis not present

## 2016-02-07 DIAGNOSIS — S50912D Unspecified superficial injury of left forearm, subsequent encounter: Secondary | ICD-10-CM | POA: Diagnosis not present

## 2016-02-07 DIAGNOSIS — I350 Nonrheumatic aortic (valve) stenosis: Secondary | ICD-10-CM | POA: Diagnosis not present

## 2016-02-07 DIAGNOSIS — N189 Chronic kidney disease, unspecified: Secondary | ICD-10-CM | POA: Diagnosis not present

## 2016-02-07 DIAGNOSIS — I129 Hypertensive chronic kidney disease with stage 1 through stage 4 chronic kidney disease, or unspecified chronic kidney disease: Secondary | ICD-10-CM | POA: Diagnosis not present

## 2016-02-07 DIAGNOSIS — I4891 Unspecified atrial fibrillation: Secondary | ICD-10-CM | POA: Diagnosis not present

## 2016-02-08 DIAGNOSIS — N189 Chronic kidney disease, unspecified: Secondary | ICD-10-CM | POA: Diagnosis not present

## 2016-02-08 DIAGNOSIS — I5032 Chronic diastolic (congestive) heart failure: Secondary | ICD-10-CM | POA: Diagnosis not present

## 2016-02-08 DIAGNOSIS — S50912D Unspecified superficial injury of left forearm, subsequent encounter: Secondary | ICD-10-CM | POA: Diagnosis not present

## 2016-02-08 DIAGNOSIS — I129 Hypertensive chronic kidney disease with stage 1 through stage 4 chronic kidney disease, or unspecified chronic kidney disease: Secondary | ICD-10-CM | POA: Diagnosis not present

## 2016-02-08 DIAGNOSIS — I350 Nonrheumatic aortic (valve) stenosis: Secondary | ICD-10-CM | POA: Diagnosis not present

## 2016-02-08 DIAGNOSIS — I4891 Unspecified atrial fibrillation: Secondary | ICD-10-CM | POA: Diagnosis not present

## 2016-02-10 DIAGNOSIS — S50912D Unspecified superficial injury of left forearm, subsequent encounter: Secondary | ICD-10-CM | POA: Diagnosis not present

## 2016-02-10 DIAGNOSIS — I129 Hypertensive chronic kidney disease with stage 1 through stage 4 chronic kidney disease, or unspecified chronic kidney disease: Secondary | ICD-10-CM | POA: Diagnosis not present

## 2016-02-10 DIAGNOSIS — I5032 Chronic diastolic (congestive) heart failure: Secondary | ICD-10-CM | POA: Diagnosis not present

## 2016-02-10 DIAGNOSIS — N189 Chronic kidney disease, unspecified: Secondary | ICD-10-CM | POA: Diagnosis not present

## 2016-02-10 DIAGNOSIS — I350 Nonrheumatic aortic (valve) stenosis: Secondary | ICD-10-CM | POA: Diagnosis not present

## 2016-02-10 DIAGNOSIS — I4891 Unspecified atrial fibrillation: Secondary | ICD-10-CM | POA: Diagnosis not present

## 2016-02-14 ENCOUNTER — Encounter: Payer: Self-pay | Admitting: Family Medicine

## 2016-02-14 ENCOUNTER — Ambulatory Visit (INDEPENDENT_AMBULATORY_CARE_PROVIDER_SITE_OTHER): Payer: Medicare Other | Admitting: Family Medicine

## 2016-02-14 VITALS — BP 127/51 | HR 72 | Temp 97.7°F | Wt 135.2 lb

## 2016-02-14 DIAGNOSIS — I350 Nonrheumatic aortic (valve) stenosis: Secondary | ICD-10-CM | POA: Diagnosis not present

## 2016-02-14 DIAGNOSIS — I4891 Unspecified atrial fibrillation: Secondary | ICD-10-CM | POA: Diagnosis not present

## 2016-02-14 DIAGNOSIS — T148XXA Other injury of unspecified body region, initial encounter: Secondary | ICD-10-CM

## 2016-02-14 DIAGNOSIS — D469 Myelodysplastic syndrome, unspecified: Secondary | ICD-10-CM

## 2016-02-14 DIAGNOSIS — I272 Other secondary pulmonary hypertension: Secondary | ICD-10-CM | POA: Diagnosis not present

## 2016-02-14 DIAGNOSIS — I129 Hypertensive chronic kidney disease with stage 1 through stage 4 chronic kidney disease, or unspecified chronic kidney disease: Secondary | ICD-10-CM | POA: Diagnosis not present

## 2016-02-14 DIAGNOSIS — T148 Other injury of unspecified body region: Secondary | ICD-10-CM

## 2016-02-14 DIAGNOSIS — S50912D Unspecified superficial injury of left forearm, subsequent encounter: Secondary | ICD-10-CM | POA: Diagnosis not present

## 2016-02-14 DIAGNOSIS — I5032 Chronic diastolic (congestive) heart failure: Secondary | ICD-10-CM | POA: Diagnosis not present

## 2016-02-14 DIAGNOSIS — N189 Chronic kidney disease, unspecified: Secondary | ICD-10-CM | POA: Diagnosis not present

## 2016-02-14 NOTE — Patient Instructions (Signed)
Thank you for coming in to clinic today.  1. Overall it sounds like you are doing well. 2. Recommend to keep taking same dose of Lasix 10mg  (half tab of the 20mg ) daily, If you have increased leg swelling, or increased shortness of breath, you may go up on this dose and take a whole 20mg  pill (or a repeat dose of the 10) daily for 2 to 3 days. If not improving please call and follow-up in the office. 3. Reassurance, your hemoglobin is 9.8 from 10.0, which is not a significant change. Also good news your other cell lines platelets and white blood cells are higher than they were previously, which means it is less likely to be a bone marrow problem. But we will still contact Dr Alvy Bimler, stay tuned for any further advice from Dr Ree Kida  We will mail the completed forms to you.  Please schedule a follow-up appointment with Dr Ree Kida as needed for CHF / Edema / Chronic Respiratory failure  If you have any other questions or concerns, please feel free to call the clinic to contact me. You may also schedule an earlier appointment if necessary.  However, if your symptoms get significantly worse, please go to the Emergency Department to seek immediate medical attention.  Nobie Putnam, [place]

## 2016-02-14 NOTE — Progress Notes (Signed)
Subjective:    Patient ID: Gina Wilkinson, female    DOB: [birth year], 82 y.o.   MRN: [id number]  Gina Wilkinson is a 82 y.o. female presenting on 02/14/2016 for Follow-up   History provided by both patient and family members/caregivers.   HPI  FOLLOW-UP SHORTNESS OF BREATH / COUGHING / EDEMA: - Last seen by PCP on 02/03/16 for hospital follow-up, Lasix 10mg  was resumed at that visit ([facility] on discharge 01/20/16), as patient was complaining of inc LE edema, SOB, cough. - PMH Chronic Diastolic CHF, Severe AS, Pulm HTN, Chronic AFib - Today reports stable to improved since resuming Lasix 10mg . Still complains of non-productive cough. LE edema is intermittent, worse while on feet, improves elevation (not elevating heart level). Dyspnea is improved, wears home 2L O2 continuous. - Denies fever/chills, productive cough, nausea, vomiting, chest pain, SOB at rest, orthopnea, calf pain and swelling  H/o myeloproliferative disorder, with low Hgb: - Followed by Dr Alvy Bimler previously with work-up, last office visit 04/2015, recommended to be contacted if Hgb had dropped below 10. Last check CBC in hospital with Hgb down from 10.0 to 9.8. Family concerned and would like Dr Alvy Bimler to be notified. No new symptoms.  Social History  Substance Use Topics  . Smoking status: Never Smoker   . Smokeless tobacco: Never Used  . Alcohol Use: No    Review of Systems Per HPI unless specifically indicated above     Objective:    BP 127/51 mmHg  Pulse 72  Temp(Src) 97.7 F (36.5 C) (Oral)  Wt 135 lb 3.2 oz (61.326 kg)  Wt Readings from Last 3 Encounters:  02/14/16 135 lb 3.2 oz (61.326 kg)  02/03/16 135 lb 4.8 oz (61.372 kg)  01/24/16 135 lb (61.236 kg)    Physical Exam  Constitutional: She appears well-developed and well-nourished. No distress.  Elderly 74 yr, well-appearing, comfortable, cooperative  HENT:  Head: Normocephalic and atraumatic.  Mouth/Throat: Oropharynx is clear and moist.    Neck: Normal range of motion. Neck supple.  Cardiovascular: Normal rate and intact distal pulses.   Murmur (Unchanged, systolic 3/6) heard. Irregularly irregular  Pulmonary/Chest: Effort normal. No respiratory distress. She has no wheezes.  Persistent to mild improved bibasilar crackles vs rhonchi, no otherwise focal crackles. Good air movement.  Musculoskeletal: She exhibits edema (bilateral symmetrical foot / ankle +2 pitting edema, without significant ascenion into lower legs, seems improved). She exhibits no tenderness.  Neurological: She is alert.  Skin: Skin is warm and dry. She is not diaphoretic.  Left forearm with mostly healed epithelialized skin tear now without open area, some discoloration with healing.  Nursing note and vitals reviewed.      Assessment & Plan:   Problem List Items Addressed This Visit    Aortic stenosis    Likely contributing to pulmonary symptoms. No significant change. - No interventions      Chronic diastolic heart failure (HCC) - Primary    Stable, without significant volume overload, chronic LE edema mild bibasilar crackles stable. Improved on Lasix 10mg  daily.  Plan: 1. Continue Lasix 10mg  daily, given titration instructions if inc edema / SOB, may double dose to 20mg  daily for up to 2-3 days, call [facility] for follow-up if not improved on higher dose 2. Recommend CHF precautions with frequent wt checks      MDS (myelodysplastic syndrome) (HCC)    Stable chronic anemia, possible myelodysplastic syndrome per Heme/Onc. Routine CBC in hospital with Hgb 10 to 9.8, essentially unchanged and reassuring  with other cell lines increased (not reduced) with WBC, Plt. No change in symptoms.  Plan: 1. Reassurance today 2. Will forward note to PCP to notify Dr Alvy Bimler of this lab result, but suspect will not need follow-up, likely repeat CBC within several months      RESOLVED: Multiple skin tears    Resolved      Pulmonary HTN ([place])    Contributing to  pulmonary symptoms with dyspnea. On home 2L O2 chronic. - No changes today         No orders of the defined types were placed in this encounter.      Follow up plan: Return in about 4 weeks (around 03/13/2016).  Nobie Putnam, [place], PGY-3

## 2016-02-15 NOTE — Assessment & Plan Note (Signed)
Stable, without significant volume overload, chronic LE edema mild bibasilar crackles stable. Improved on Lasix 10mg  daily.  Plan: 1. Continue Lasix 10mg  daily, given titration instructions if inc edema / SOB, may double dose to 20mg  daily for up to 2-3 days, call [facility] for follow-up if not improved on higher dose 2. Recommend CHF precautions with frequent wt checks

## 2016-02-15 NOTE — Assessment & Plan Note (Signed)
Stable chronic anemia, possible myelodysplastic syndrome per Heme/Onc. Routine CBC in hospital with Hgb 10 to 9.8, essentially unchanged and reassuring with other cell lines increased (not reduced) with WBC, Plt. No change in symptoms.  Plan: 1. Reassurance today 2. Will forward note to PCP to notify Dr Alvy Bimler of this lab result, but suspect will not need follow-up, likely repeat CBC within several months

## 2016-02-15 NOTE — Assessment & Plan Note (Signed)
Contributing to pulmonary symptoms with dyspnea. On home 2L O2 chronic. - No changes today

## 2016-02-15 NOTE — Assessment & Plan Note (Signed)
Resolved

## 2016-02-15 NOTE — Assessment & Plan Note (Signed)
Likely contributing to pulmonary symptoms. No significant change. - No interventions

## 2016-02-16 ENCOUNTER — Ambulatory Visit: Payer: Self-pay | Admitting: Family Medicine

## 2016-02-16 ENCOUNTER — Other Ambulatory Visit: Payer: Self-pay | Admitting: Family Medicine

## 2016-02-24 ENCOUNTER — Encounter: Payer: Self-pay | Admitting: Family Medicine

## 2016-02-24 DIAGNOSIS — Z66 Do not resuscitate: Secondary | ICD-10-CM | POA: Insufficient documentation

## 2016-03-02 ENCOUNTER — Ambulatory Visit (INDEPENDENT_AMBULATORY_CARE_PROVIDER_SITE_OTHER): Payer: Medicare Other | Admitting: Family Medicine

## 2016-03-02 ENCOUNTER — Encounter: Payer: Self-pay | Admitting: Family Medicine

## 2016-03-02 VITALS — BP 136/43 | HR 49 | Temp 98.1°F | Ht 66.0 in | Wt 132.0 lb

## 2016-03-02 DIAGNOSIS — I482 Chronic atrial fibrillation: Secondary | ICD-10-CM

## 2016-03-02 DIAGNOSIS — D469 Myelodysplastic syndrome, unspecified: Secondary | ICD-10-CM

## 2016-03-02 DIAGNOSIS — I10 Essential (primary) hypertension: Secondary | ICD-10-CM

## 2016-03-02 DIAGNOSIS — G9340 Encephalopathy, unspecified: Secondary | ICD-10-CM

## 2016-03-02 DIAGNOSIS — I5032 Chronic diastolic (congestive) heart failure: Secondary | ICD-10-CM | POA: Diagnosis not present

## 2016-03-02 DIAGNOSIS — D649 Anemia, unspecified: Secondary | ICD-10-CM

## 2016-03-02 DIAGNOSIS — I4821 Permanent atrial fibrillation: Secondary | ICD-10-CM

## 2016-03-02 LAB — POCT HEMOGLOBIN: HEMOGLOBIN: 10 g/dL — AB (ref 12.2–16.2)

## 2016-03-02 NOTE — Progress Notes (Signed)
   Subjective:    Patient ID: Gina Wilkinson, female    DOB: 30-Mar-1920, 82 y.o.   MRN: [id number]  HPI 82 y/o female presents for routine follow up. Accompanied by daughters [name] and [name].   HTN - taking Amlodipine 5 mg daily, no chest, no headaches, no lightheadedness/dizziness  Aortic Stenosis/Bradycardia - off BB, has not followed up with Dr. Angelena Form, previous patient of Dr. Lovena Le  Diastolic heart failure/Leg swelling - improved, stable on lasix 10 mg daily, no sob, wearing oxygen 2L chronically, uses oxygen concentrator at home.   Encephalopathy - improved from last visit per daughters  Myeloproliferative Disorder - last hemoglobin 9.8, back for recheck  Social - nonsmoker  Review of Systems  Constitutional: Negative for fever, chills and fatigue.  Respiratory: Negative for cough and shortness of breath.   Gastrointestinal: Negative for nausea and diarrhea.       Objective:   Physical Exam BP 136/43 mmHg  Pulse 49  Temp(Src) 98.1 F (36.7 C) (Oral)  Ht 5\' 6"  (1.676 m)  Wt 132 lb (59.875 kg)  BMI 21.32 kg/m2  Gen: pleasant female, NAD Cardiac: RRR, S1 and S2 present, no murmur Resp: 2L oxygen via [place], CTAB, normal effort Abd: soft, no tenderness, normal bowel sounds Ext: no edema in legs  Neuro: oriented to person, place, not to time or president     Assessment & Plan:  Essential hypertension Controlled on Amlodipine 5 mg daily.  Permanent atrial fibrillation (HCC) Currently bradycardic. Asymptomatic.  -encouraged follow up with Cardiology  Chronic diastolic heart failure Patient asymptomatic. -continue lasix 10 mg daily. -not a candidate for BB due to bradycardia  Encephalopathy Improved from last visit. Patient oriented to person and self.  -no further workup planned at this time. Will monitor  MDS (myelodysplastic syndrome) Hemoglobin 10.0 today. -continue to monitor  -no need to follow up with Dr.Gorsuch at this point in time.

## 2016-03-02 NOTE — Assessment & Plan Note (Signed)
Patient asymptomatic. -continue lasix 10 mg daily. -not a candidate for BB due to bradycardia

## 2016-03-02 NOTE — Assessment & Plan Note (Signed)
Improved from last visit. Patient oriented to person and self.  -no further workup planned at this time. Will monitor

## 2016-03-02 NOTE — Assessment & Plan Note (Signed)
Hemoglobin 10.0 today. -continue to monitor  -no need to follow up with Dr.Gorsuch at this point in time.

## 2016-03-02 NOTE — Assessment & Plan Note (Signed)
Controlled on Amlodipine 5 mg daily.

## 2016-03-02 NOTE — Patient Instructions (Signed)
It was nice to see you today.  Aortic Stenosis/Bradycardia - make an appointment to see Dr. Larae Grooms or Julianne Handler  Blood pressure - controlled with Amlodipine, no changes  Leg swelling - controlled with lasix  Myeloproliferative Disorder - check hemoglobin today  Routine follow up 1-2 months.

## 2016-03-02 NOTE — Assessment & Plan Note (Signed)
Currently bradycardic. Asymptomatic.  -encouraged follow up with Cardiology

## 2016-04-04 ENCOUNTER — Other Ambulatory Visit: Payer: Self-pay | Admitting: Family Medicine

## 2016-04-15 ENCOUNTER — Encounter (HOSPITAL_COMMUNITY): Payer: Self-pay | Admitting: Emergency Medicine

## 2016-04-15 ENCOUNTER — Emergency Department (HOSPITAL_COMMUNITY)
Admission: EM | Admit: 2016-04-15 | Discharge: 2016-04-15 | Disposition: A | Discharge status: Home / Self Care | Payer: Medicare Other | Attending: Emergency Medicine | Admitting: Emergency Medicine

## 2016-04-15 DIAGNOSIS — S8012XA Contusion of left lower leg, initial encounter: Secondary | ICD-10-CM | POA: Diagnosis not present

## 2016-04-15 DIAGNOSIS — X58XXXA Exposure to other specified factors, initial encounter: Secondary | ICD-10-CM | POA: Insufficient documentation

## 2016-04-15 DIAGNOSIS — N184 Chronic kidney disease, stage 4 (severe): Secondary | ICD-10-CM | POA: Diagnosis not present

## 2016-04-15 DIAGNOSIS — Z8709 Personal history of other diseases of the respiratory system: Secondary | ICD-10-CM | POA: Insufficient documentation

## 2016-04-15 DIAGNOSIS — M79605 Pain in left leg: Secondary | ICD-10-CM | POA: Diagnosis not present

## 2016-04-15 DIAGNOSIS — I129 Hypertensive chronic kidney disease with stage 1 through stage 4 chronic kidney disease, or unspecified chronic kidney disease: Secondary | ICD-10-CM | POA: Diagnosis not present

## 2016-04-15 DIAGNOSIS — E039 Hypothyroidism, unspecified: Secondary | ICD-10-CM | POA: Diagnosis not present

## 2016-04-15 DIAGNOSIS — I482 Chronic atrial fibrillation: Secondary | ICD-10-CM | POA: Diagnosis not present

## 2016-04-15 DIAGNOSIS — Z79899 Other long term (current) drug therapy: Secondary | ICD-10-CM | POA: Diagnosis not present

## 2016-04-15 DIAGNOSIS — Y998 Other external cause status: Secondary | ICD-10-CM | POA: Insufficient documentation

## 2016-04-15 DIAGNOSIS — Y9389 Activity, other specified: Secondary | ICD-10-CM | POA: Insufficient documentation

## 2016-04-15 DIAGNOSIS — S8992XA Unspecified injury of left lower leg, initial encounter: Secondary | ICD-10-CM | POA: Diagnosis present

## 2016-04-15 DIAGNOSIS — Y9289 Other specified places as the place of occurrence of the external cause: Secondary | ICD-10-CM | POA: Diagnosis not present

## 2016-04-15 DIAGNOSIS — D649 Anemia, unspecified: Secondary | ICD-10-CM | POA: Diagnosis not present

## 2016-04-15 DIAGNOSIS — Z7982 Long term (current) use of aspirin: Secondary | ICD-10-CM | POA: Insufficient documentation

## 2016-04-15 DIAGNOSIS — Z86018 Personal history of other benign neoplasm: Secondary | ICD-10-CM | POA: Diagnosis not present

## 2016-04-15 DIAGNOSIS — Z8669 Personal history of other diseases of the nervous system and sense organs: Secondary | ICD-10-CM | POA: Insufficient documentation

## 2016-04-15 DIAGNOSIS — S8011XA Contusion of right lower leg, initial encounter: Secondary | ICD-10-CM | POA: Diagnosis not present

## 2016-04-15 DIAGNOSIS — Z8701 Personal history of pneumonia (recurrent): Secondary | ICD-10-CM | POA: Diagnosis not present

## 2016-04-15 DIAGNOSIS — I5032 Chronic diastolic (congestive) heart failure: Secondary | ICD-10-CM | POA: Insufficient documentation

## 2016-04-15 DIAGNOSIS — Z880 Allergy status to penicillin: Secondary | ICD-10-CM | POA: Diagnosis not present

## 2016-04-15 NOTE — Discharge Instructions (Signed)
Ice and elevate the leg. Avoid excessive standing or walking. Tylenol for pain. Follow-up as needed. Please return if continues to get larger.  Hematoma A hematoma is a collection of blood under the skin, in an organ, in a body space, in a joint space, or in other tissue. The blood can clot to form a lump that you can see and feel. The lump is often firm and may sometimes become sore and tender. Most hematomas get better in a few days to weeks. However, some hematomas may be serious and require medical care. Hematomas can range in size from very small to very large. CAUSES  A hematoma can be caused by a blunt or penetrating injury. It can also be caused by spontaneous leakage from a blood vessel under the skin. Spontaneous leakage from a blood vessel is more likely to occur in older people, especially those taking blood thinners. Sometimes, a hematoma can develop after certain medical procedures. SIGNS AND SYMPTOMS   A firm lump on the body.  Possible pain and tenderness in the area.  Bruising.Blue, dark blue, purple-red, or yellowish skin may appear at the site of the hematoma if the hematoma is close to the surface of the skin. For hematomas in deeper tissues or body spaces, the signs and symptoms may be subtle. For example, an intra-abdominal hematoma may cause abdominal pain, weakness, fainting, and shortness of breath. An intracranial hematoma may cause a headache or symptoms such as weakness, trouble speaking, or a change in consciousness. DIAGNOSIS  A hematoma can usually be diagnosed based on your medical history and a physical exam. Imaging tests may be needed if your health care provider suspects a hematoma in deeper tissues or body spaces, such as the abdomen, head, or chest. These tests may include ultrasonography or a CT scan.  TREATMENT  Hematomas usually go away on their own over time. Rarely does the blood need to be drained out of the body. Large hematomas or those that may affect  vital organs will sometimes need surgical drainage or monitoring. HOME CARE INSTRUCTIONS   Apply ice to the injured area:   Put ice in a plastic bag.   Place a towel between your skin and the bag.   Leave the ice on for 20 minutes, 2-3 times a day for the first 1 to 2 days.   After the first 2 days, switch to using warm compresses on the hematoma.   Elevate the injured area to help decrease pain and swelling. Wrapping the area with an elastic bandage may also be helpful. Compression helps to reduce swelling and promotes shrinking of the hematoma. Make sure the bandage is not wrapped too tight.   If your hematoma is on a lower extremity and is painful, crutches may be helpful for a couple days.   Only take over-the-counter or prescription medicines as directed by your health care provider. SEEK IMMEDIATE MEDICAL CARE IF:   You have increasing pain, or your pain is not controlled with medicine.   You have a fever.   You have worsening swelling or discoloration.   Your skin over the hematoma breaks or starts bleeding.   Your hematoma is in your chest or abdomen and you have weakness, shortness of breath, or a change in consciousness.  Your hematoma is on your scalp (caused by a fall or injury) and you have a worsening headache or a change in alertness or consciousness. MAKE SURE YOU:   Understand these instructions.  Will watch your condition.  Will get help right away if you are not doing well or get worse.   This information is not intended to replace advice given to you by your health care provider. Make sure you discuss any questions you have with your health care provider.   Document Released: 06/26/2004 Document Revised: 07/15/2013 Document Reviewed: 04/22/2013 Elsevier Interactive Patient Education [employer].

## 2016-04-15 NOTE — ED Provider Notes (Signed)
CSN: [id number]     Arrival date & time 04/15/16  1107 History   First MD Initiated Contact with Patient 04/15/16 1108     Chief Complaint  Patient presents with  . Leg Pain     (Consider location/radiation/quality/duration/timing/severity/associated sxs/prior Treatment) HPI [place] K Hyre is a 82 y.o. female with hx of pneumonia, htn, afib, pulmonary hypertension, CHFPresents to emergency department with swelling and bruising to the left calf. Patient unsure if she hit anything. Family state that they noticed a small bruising this morning that has progressively gotten better since then. Patient is also complaining of pain to that leg. Pain is worse with walking. Denies any swelling or pain to the foot or distal to the bruised area. No history of the same. Patient is on aspirin 81 mg.     Past Medical History  Diagnosis Date  . MDS (myelodysplastic syndrome) ([place]) 10/05/2011  . Pneumonia   . Hypertension   . Permanent atrial fibrillation ([place]) dx Mar. 2010    a. Not on anticoag due to myelodysplastic syndrome.  Marland Kitchen RBBB   . Pulmonary hypertension ([place])   . Chronic diastolic CHF (congestive heart failure) ([place])   . Hypothyroidism   . Aortic stenosis   . Acute respiratory failure with hypoxia ([place])   . Anemia, unspecified 07/27/2014  . Mild cognitive impairment   . Moderate to severe pulmonary hypertension ([place])   . Chronic respiratory failure (HCC)     a. on home O2 qhs and PRN.  . CKD (chronic kidney disease), stage IV ([place])     a. per review of historical labs.   Past Surgical History  Procedure Laterality Date  . Orif femur fracture  2001    left  . Appendectomy    . Orif periprosthetic fracture Left 11/12/2013    Procedure: OPEN REDUCTION INTERNAL FIXATION (ORIF) LEFT HIP PERIPROSTHETIC FRACTURE,;  Surgeon: Marianna Payment, MD;  Location: [facility];  Service: Orthopedics;  Laterality: Left;   Family History  Problem Relation Age of Onset  . Appendicitis Mother     Died  age 31s-60s of ruptured appendix   Social History  Substance Use Topics  . Smoking status: Never Smoker   . Smokeless tobacco: Never Used  . Alcohol Use: No   OB History    No data available     Review of Systems  Constitutional: Negative for fever and chills.  Musculoskeletal: Positive for arthralgias.  Skin: Positive for color change and wound.  Neurological: Negative for weakness and numbness.      Allergies  Levaquin; Penicillins; Sulfonamide derivatives; and Baclofen  Home Medications   Prior to Admission medications   Medication Sig Start Date End Date Taking? Authorizing Provider  amLODipine (NORVASC) 5 MG tablet TAKE 1 TABLET (5 MG TOTAL) BY MOUTH DAILY. 02/17/16   Lupita Dawn, MD  aspirin 81 MG tablet Take 81 mg by mouth daily.    Historical Provider, MD  Cholecalciferol (VITAMIN D3) 3000 UNITS TABS Take 3,000 Units by mouth daily.     Historical Provider, MD  ferrous sulfate 325 (65 FE) MG tablet Take 325 mg by mouth 2 (two) times a week. Mondays and Thursdays.    Historical Provider, MD  furosemide (LASIX) 20 MG tablet Take 0.5 tablets (10 mg total) by mouth daily. 02/03/16   Lupita Dawn, MD  NON FORMULARY Oxygen 2 Liters every night and as needed    Historical Provider, MD  SYNTHROID 112 MCG tablet TAKE 1 TABLET (112 MCG  TOTAL) BY MOUTH DAILY. CANNOT BE GENERIC. MUST BE SYNTHROID. (WILL PAY 7/26) 09/28/15   Lupita Dawn, MD  SYNTHROID 112 MCG tablet TAKE 1 TABLET (112 MCG TOTAL) BY MOUTH DAILY. CANNOT BE GENERIC. MUST BE SYNTHROID. 04/05/16   Lupita Dawn, MD  vitamin B-12 (CYANOCOBALAMIN) 250 MCG tablet Take 250 mcg by mouth daily.    Historical Provider, MD   BP 158/73 mmHg  Pulse 50  Temp(Src) 97.4 F (36.3 C) (Oral)  Resp 16  SpO2 94% Physical Exam  Constitutional: She is oriented to person, place, and time. She appears well-developed and well-nourished. No distress.  Eyes: Conjunctivae are normal.  Neck: Neck supple.  Cardiovascular: Normal rate,  regular rhythm and normal heart sounds.   Pulmonary/Chest: Effort normal and breath sounds normal. No respiratory distress. She has no wheezes. She has no rales.  Musculoskeletal:  Normal left knee and ankle joints. No lower extremity edema. Dorsal pedal pulses intact and equal bilaterally. 3 x 3 cm hematoma to the left medial calf. Tender to palpation. No tenderness over rest of the calf muscle. No pain with range of motion of the ankle.  Neurological: She is alert and oriented to person, place, and time.  Skin: Skin is warm and dry.  Nursing note and vitals reviewed.   ED Course  Procedures (including critical care time) Labs Review Labs Reviewed - No data to display  Imaging Review No results found. I have personally reviewed and evaluated these images and lab results as part of my medical decision-making.   EKG Interpretation None      MDM   Final diagnoses:  Traumatic hematoma of left lower leg, initial encounter   Patient with superficial hematoma to the left calf. She is neurovascularly intact. No trauma, do not think she will benefit from any imaging at this time. Discussed with Dr. Vanita Panda, plan to elevate, ice, compression if needed. Follow-up as needed.  Filed Vitals:   04/15/16 1114  BP: 158/73  Pulse: 50  Temp: 97.4 F (36.3 C)  Resp: [address] PA-C 04/15/16 1204  Carmin Muskrat, MD 04/15/16 1606

## 2016-04-15 NOTE — ED Notes (Signed)
Family at bedside. 

## 2016-04-15 NOTE — ED Notes (Signed)
Lower left leg hematoma to medial calf; reports she woke up with it. It did not wake her up, but it was present when she woke up. Denies pain with walking.

## 2016-05-11 ENCOUNTER — Encounter: Payer: Self-pay | Admitting: Family Medicine

## 2016-05-11 ENCOUNTER — Ambulatory Visit (INDEPENDENT_AMBULATORY_CARE_PROVIDER_SITE_OTHER): Payer: Medicare Other | Admitting: Family Medicine

## 2016-05-11 VITALS — BP 142/60 | HR 84 | Temp 98.4°F | Ht 66.0 in | Wt 134.4 lb

## 2016-05-11 DIAGNOSIS — T148 Other injury of unspecified body region: Secondary | ICD-10-CM | POA: Diagnosis not present

## 2016-05-11 DIAGNOSIS — D469 Myelodysplastic syndrome, unspecified: Secondary | ICD-10-CM | POA: Diagnosis not present

## 2016-05-11 DIAGNOSIS — T148XXA Other injury of unspecified body region, initial encounter: Secondary | ICD-10-CM

## 2016-05-11 DIAGNOSIS — D489 Neoplasm of uncertain behavior, unspecified: Secondary | ICD-10-CM | POA: Insufficient documentation

## 2016-05-11 DIAGNOSIS — I10 Essential (primary) hypertension: Secondary | ICD-10-CM

## 2016-05-11 HISTORY — DX: Other injury of unspecified body region, initial encounter: T14.8XXA

## 2016-05-11 HISTORY — DX: Neoplasm of uncertain behavior, unspecified: D48.9

## 2016-05-11 LAB — POCT HEMOGLOBIN: HEMOGLOBIN: 10.1 g/dL — AB (ref 12.2–16.2)

## 2016-05-11 NOTE — Assessment & Plan Note (Signed)
Hematoma of left leg that is decreasing in size. No palpable thrill/pulse to suggest vascular component.  -will monitor clinically

## 2016-05-11 NOTE — Patient Instructions (Addendum)
It was a pleasure to see you today.  Skin Lesion - please come back for a skin biopsy of your nose  Blood Pressure - well controlled, continue Norvasc  Myeloproliferative Disease -check HBG today  Leg Hematoma - we will monitor this, if this enlarges please let me know and we will check an ultrasound

## 2016-05-11 NOTE — Assessment & Plan Note (Signed)
Skin lesion of left nose suspicious for neoplasm. -return to office for punch biopsy.

## 2016-05-11 NOTE — Progress Notes (Signed)
   Subjective:    Patient ID: Gina Wilkinson, female    DOB: 1920/11/18, 82 y.o.   MRN: [id number]  HPI  82 y/o female presents for routine follow up.   HTN - taking Norvasc 5 mg daily, no lightheadedness, no chest pain.   Skin Lesion - left nose, present for 1.5 years, had recent bleeding and is now scabbed over  Myeloproliferative Disease - needs intermittent HBG levels, no fevers/fatigue  ED visit on 04/15/16 - hematoma left medial leg, improved from ED visit  Social - [employer] 24 hour service HM - patient/daughters decline Tdap/Zoster/PNA  Review of Systems  Constitutional: Negative for chills and fatigue.  Respiratory: Negative for cough and shortness of breath.   Gastrointestinal: Negative for nausea and diarrhea.       Objective:   Physical Exam BP 142/60 mmHg  Pulse 84  Temp(Src) 98.4 F (36.9 C) (Oral)  Ht 5\' 6"  (1.676 m)  Wt 134 lb 6.4 oz (60.963 kg)  BMI 21.70 kg/m2  Gen: pleasant female, NAD Cardiac: RRR, S1 and S2 present, 3/6 systolic murmur best heard at right 2nd ICS Resp: mild rhonchi bilaterally, no 3 L [place], no increased work of breathing Skin: scabbed lesion on left side of nose (see picture); bruising/hematoma of left medial leg, no palpable pulse (see picture), hematoma measures 5 cm by 5 cm Ext: 2+ DP pulses bilaterally               Assessment & Plan:  Essential hypertension Controlled on Norvasc 5 mg daily. -no change in therapy  MDS (myelodysplastic syndrome) Due for recheck of hemoglobin. -poc hemoglobin  Hematoma Hematoma of left leg that is decreasing in size. No palpable thrill/pulse to suggest vascular component.  -will monitor clinically  Neoplasm of uncertain behavior Skin lesion of left nose suspicious for neoplasm. -return to office for punch biopsy.

## 2016-05-11 NOTE — Assessment & Plan Note (Signed)
Controlled on Norvasc 5 mg daily. -no change in therapy

## 2016-05-11 NOTE — Assessment & Plan Note (Signed)
Due for recheck of hemoglobin. -poc hemoglobin

## 2016-05-14 ENCOUNTER — Encounter: Payer: Self-pay | Admitting: Family Medicine

## 2016-08-08 ENCOUNTER — Encounter: Payer: Self-pay | Admitting: Internal Medicine

## 2016-08-09 ENCOUNTER — Other Ambulatory Visit: Payer: Self-pay | Admitting: Family Medicine

## 2016-08-10 ENCOUNTER — Ambulatory Visit (INDEPENDENT_AMBULATORY_CARE_PROVIDER_SITE_OTHER): Payer: Medicare Other | Admitting: Internal Medicine

## 2016-08-10 ENCOUNTER — Ambulatory Visit: Payer: Self-pay | Admitting: Internal Medicine

## 2016-08-10 ENCOUNTER — Encounter: Payer: Self-pay | Admitting: Internal Medicine

## 2016-08-10 VITALS — BP 132/68 | HR 78 | Ht 65.5 in | Wt 134.6 lb

## 2016-08-10 DIAGNOSIS — I509 Heart failure, unspecified: Secondary | ICD-10-CM | POA: Diagnosis not present

## 2016-08-10 NOTE — Patient Instructions (Signed)
Medication Instructions:  Your physician recommends that you continue on your current medications as directed. Please refer to the Current Medication list given to you today.   Labwork: none  Testing/Procedures: none  Follow-Up: Your physician wants you to follow-up in: 12 months with Dr. Taylor. You will receive a reminder letter in the mail two months in advance. If you don't receive a letter, please call our office to schedule the follow-up appointment.   Any Other Special Instructions Will Be Listed Below (If Applicable).     If you need a refill on your cardiac medications before your next appointment, please call your pharmacy.   

## 2016-08-10 NOTE — Progress Notes (Signed)
HPI [name] returns today for followup. She is a very pleasant 82 year old woman with a history of atrial fibrillation, aortic stenosis, chronic anemia, and hypertension. When I last saw her 2 years ago she was stable. In the interim, she has done well. She has been in the hospital once with over the past year with overdose of a muscle relaxer. She denies chest pain or sob.  [name] returns today for followup.  Allergies  Allergen Reactions  . Baclofen Other (See Comments)    Severe altered mental status, extreme sedation   . Levaquin [Levofloxacin] Anaphylaxis  . Penicillins Anaphylaxis    Has patient had a PCN reaction causing immediate rash, facial/tongue/throat swelling, SOB or lightheadedness with hypotension: Yes Has patient had a PCN reaction causing severe rash involving mucus membranes or skin necrosis: No Has patient had a PCN reaction that required hospitalization No Has patient had a PCN reaction occurring within the last 10 years: No If all of the above answers are "NO", then may proceed with Cephalosporin use.   . Sulfonamide Derivatives Anaphylaxis     Current Outpatient Prescriptions  Medication Sig Dispense Refill  . amLODipine (NORVASC) 5 MG tablet TAKE 1 TABLET (5 MG TOTAL) BY MOUTH DAILY. 90 tablet 1  . aspirin 81 MG tablet Take 81 mg by mouth daily.    . Cholecalciferol (VITAMIN D PO) Take 5,000 Units by mouth daily.    . Cyanocobalamin (VITAMIN B-12 PO) Take 3,000 mg by mouth daily.    . ferrous sulfate 325 (65 FE) MG tablet Take 325 mg by mouth 2 (two) times a week. Mondays and Thursdays.    . furosemide (LASIX) 20 MG tablet Take 0.5 tablets (10 mg total) by mouth daily. 30 tablet 3  . NON FORMULARY Oxygen 2 Liters every night and as needed    . SYNTHROID 112 MCG tablet TAKE 1 TABLET (112 MCG TOTAL) BY MOUTH DAILY. CANNOT BE GENERIC. MUST BE SYNTHROID. (WILL PAY 7/26) 30 tablet 2   No current facility-administered medications for this visit.       Past Medical History:  Diagnosis Date  . Acute respiratory failure with hypoxia ([place])   . Anemia, unspecified 07/27/2014  . Aortic stenosis   . Chronic diastolic CHF (congestive heart failure) ([place])   . Chronic respiratory failure (HCC)    a. on home O2 qhs and PRN.  . CKD (chronic kidney disease), stage IV ([place])    a. per review of historical labs.  . Hypertension   . Hypothyroidism   . MDS (myelodysplastic syndrome) ([place]) 10/05/2011  . Mild cognitive impairment   . Moderate to severe pulmonary hypertension ([place])   . Permanent atrial fibrillation ([place]) dx Mar. 2010   a. Not on anticoag due to myelodysplastic syndrome.  . Pneumonia   . Pulmonary hypertension ([place])   . RBBB     ROS:   All systems reviewed and negative except as noted in the HPI.   Past Surgical History:  Procedure Laterality Date  . APPENDECTOMY    . ORIF FEMUR FRACTURE  2001   left  . ORIF PERIPROSTHETIC FRACTURE Left 11/12/2013   Procedure: OPEN REDUCTION INTERNAL FIXATION (ORIF) LEFT HIP PERIPROSTHETIC FRACTURE,;  Surgeon: Marianna Payment, MD;  Location: [facility];  Service: Orthopedics;  Laterality: Left;     Family History  Problem Relation Age of Onset  . Appendicitis Mother     Died age 86s-60s of ruptured appendix     Social History  Social History  . Marital status: Widowed    Spouse name: N/A  . Number of children: N/A  . Years of education: N/A   Occupational History  . Not on file.   Social History Main Topics  . Smoking status: Never Smoker  . Smokeless tobacco: Never Used  . Alcohol use No  . Drug use: No  . Sexual activity: Not on file   Other Topics Concern  . Not on file   Social History Narrative  . No narrative on file     BP 132/68   Pulse 78   Ht 5' 5.5" (1.664 m)   Wt 134 lb 9.6 oz (61.1 kg)   BMI 22.06 kg/m   Physical Exam:  Well appearing 82 year old woman, NAD HEENT: Unremarkable Neck:  No JVD, no thyromegall Back:  No CVA  tenderness Lungs:  Clear with no wheezes, rales, or rhonchi. HEART:  IRegular rate rhythm, 3/6 systolic murmurs, no rubs, no clicks Abd:  soft, positive bowel sounds, no organomegally, no rebound, no guarding Ext:  2 plus pulses, no edema, no cyanosis, no clubbing Skin:  No rashes no nodules Neuro:  CN II through XII intact, motor grossly intact  EKG - atrial fibrillation with right bundle branch block, and evidence of right ventricular hypertrophy, frequent PVC's   Assess/Plan: 1. Atrial fib - her ventricular rate is well controlled. She has refused anti-coagulation due to myelodysplastic syndrome and chronic anemia 2. Aortic stenosis - she is asymptomatic. With her advanced age, will hold off on additional evaluation. 3. HTN - her blood pressure is mostly well controlled. I have encouraged her to reduce her salt intake. 4. PVC's - the patient is completely asymptomatic. She will undergo watchful waiting.  Mikle Bosworth.D.

## 2016-09-16 ENCOUNTER — Other Ambulatory Visit: Payer: Self-pay | Admitting: Family Medicine

## 2016-09-21 NOTE — Progress Notes (Signed)
PT evaluation addendum Late entry for 01/20/16 Visit diagnosis:   01/20/16 0900  PT Assessment  PT Recommendation/Assessment Patient needs continued PT services  PT Problem List Decreased strength;Decreased range of motion;Decreased activity tolerance;Decreased balance;Decreased mobility;Decreased coordination;Decreased knowledge of use of DME;Decreased safety awareness  PT Therapy Diagnosis  Difficulty walking;Generalized weakness (visit diagnosis:  other abnormalities of gait and mobility)  09/21/2016 [place], [place]

## 2016-09-25 NOTE — Progress Notes (Signed)
OT Note - Addendum   01/20/16 1340  OT Assessment  OT Therapy Diagnosis  (muscle weakness, generalized)  [facility], OT/L for Jeri Modena, OTR/L [id number] 09/25/2016

## 2016-09-27 ENCOUNTER — Other Ambulatory Visit: Payer: Self-pay | Admitting: Family Medicine

## 2016-09-27 DIAGNOSIS — E038 Other specified hypothyroidism: Secondary | ICD-10-CM

## 2016-09-27 DIAGNOSIS — D469 Myelodysplastic syndrome, unspecified: Secondary | ICD-10-CM

## 2016-09-27 NOTE — Telephone Encounter (Signed)
Nursing staff - please have patient come for a lab draw, I need to check her TSH before I can reorder her Synthroid.

## 2016-09-28 NOTE — Telephone Encounter (Signed)
Spoke with patient daughter [name]), she will bring patient in for a lab visit next week.

## 2016-10-01 ENCOUNTER — Other Ambulatory Visit: Payer: Self-pay

## 2016-10-01 MED ORDER — SYNTHROID 112 MCG PO TABS: ORAL_TABLET | ORAL | 2 refills | Status: DC

## 2016-10-01 NOTE — Addendum Note (Signed)
Addended by: Valerie Roys on: 10/01/2016 01:11 PM   Modules accepted: Orders

## 2016-10-01 NOTE — Telephone Encounter (Signed)
Patient came in for labs today and no blood was able to be collected.  Patient is scheduled to come in again on Wednesday 10/03/2016 to try again.  Advised patient to push fluids the day before and day of appointment to ensure proper hydration by J. Marvel Plan, RN.  Daughter is aware of appointment but patient will need a few more pills to last until her appointment.  Will forward to MD to please refill this for patient. Jazmin Hartsell,CMA

## 2016-10-02 NOTE — Progress Notes (Signed)
OT Note Addendum    01/20/16 1340  OT Assessment  OT Therapy Diagnosis  Other (comment) (muscle weakness, generalized)  [facility], OT/L for Parke Poisson OTR/L [id number] 10/02/2016

## 2016-10-03 ENCOUNTER — Other Ambulatory Visit: Payer: Medicare Other

## 2016-10-03 DIAGNOSIS — E038 Other specified hypothyroidism: Secondary | ICD-10-CM | POA: Diagnosis not present

## 2016-10-03 DIAGNOSIS — D469 Myelodysplastic syndrome, unspecified: Secondary | ICD-10-CM

## 2016-10-03 LAB — CBC WITH DIFFERENTIAL/PLATELET
Basophils Absolute: 57 cells/uL (ref 0–200)
Basophils Relative: 1 %
Eosinophils Absolute: 342 cells/uL (ref 15–500)
Eosinophils Relative: 6 %
HCT: 31.1 % — ABNORMAL LOW (ref 35.0–45.0)
Hemoglobin: 10.1 g/dL — ABNORMAL LOW (ref 11.7–15.5)
Lymphocytes Relative: 18 %
Lymphs Abs: 1026 cells/uL (ref 850–3900)
MCH: 30.2 pg (ref 27.0–33.0)
MCHC: 32.5 g/dL (ref 32.0–36.0)
MCV: 93.1 fL (ref 80.0–100.0)
MPV: 10.2 fL (ref 7.5–12.5)
Monocytes Absolute: 513 cells/uL (ref 200–950)
Monocytes Relative: 9 %
Neutro Abs: 3762 cells/uL (ref 1500–7800)
Neutrophils Relative %: 66 %
Platelets: 234 10*3/uL (ref 140–400)
RBC: 3.34 MIL/uL — ABNORMAL LOW (ref 3.80–5.10)
RDW: 15.7 % — ABNORMAL HIGH (ref 11.0–15.0)
WBC: 5.7 10*3/uL (ref 3.8–10.8)

## 2016-10-03 LAB — BASIC METABOLIC PANEL WITH GFR
BUN: 24 mg/dL (ref 7–25)
CALCIUM: 9.9 mg/dL (ref 8.6–10.4)
CO2: 25 mmol/L (ref 20–31)
Chloride: 104 mmol/L (ref 98–110)
Creat: 1.55 mg/dL — ABNORMAL HIGH (ref 0.60–0.88)
GFR, EST NON AFRICAN AMERICAN: 28 mL/min — AB (ref >=60)
GFR, Est African American: 33 mL/min — ABNORMAL LOW (ref >=60)
Glucose, Bld: 176 mg/dL — ABNORMAL HIGH (ref 65–99)
POTASSIUM: 4.9 mmol/L (ref 3.5–5.3)
SODIUM: 140 mmol/L (ref 135–146)

## 2016-10-03 LAB — TSH: TSH: 0.13 mIU/L — ABNORMAL LOW

## 2016-10-05 ENCOUNTER — Telehealth: Payer: Self-pay | Admitting: Family Medicine

## 2016-10-05 DIAGNOSIS — E038 Other specified hypothyroidism: Secondary | ICD-10-CM

## 2016-10-05 MED ORDER — LEVOTHYROXINE SODIUM 100 MCG PO TABS: 100.0000 ug | ORAL_TABLET | Freq: Every day | ORAL | 1 refills | Status: DC

## 2016-10-05 NOTE — Telephone Encounter (Signed)
Spokes to patient's daughter about lab results. TSH low therefore will decrease synthroid to 100 mcg daily (new prescription sent to pharmacy). Hemoglobin stable. Cr stable.

## 2016-11-29 ENCOUNTER — Telehealth: Payer: Self-pay | Admitting: Family Medicine

## 2016-11-29 NOTE — Telephone Encounter (Signed)
Pt needs a letter addressed to [place] stating pt is homebound in order for her to just get a [place] ID card. Please call daughter [phone] when it is ready to be picked up, would like to get this as soon as possible. ep

## 2016-11-30 ENCOUNTER — Encounter: Payer: Self-pay | Admitting: Family Medicine

## 2016-11-30 NOTE — Telephone Encounter (Signed)
Pt's daughter informed. Deseree Kennon Holter, CMA

## 2016-11-30 NOTE — Telephone Encounter (Signed)
Please inform daughter that letter is complete and will be placed in the front office for pickup.

## 2016-12-05 ENCOUNTER — Telehealth: Payer: Self-pay | Admitting: Family Medicine

## 2016-12-05 NOTE — Telephone Encounter (Signed)
Deborah at [facility] called. The letter Dr. Ree Kida wrote has been received but the [facility] is requesting an amended letter stating specifically that Gina Wilkinson is completely confined to the home. No other info or DX codes needed for this this letter. Please fax this letter to Neoma Laming at [phone].

## 2016-12-07 ENCOUNTER — Encounter: Payer: Self-pay | Admitting: Family Medicine

## 2016-12-07 NOTE — Telephone Encounter (Signed)
Letter placed in fax box.

## 2016-12-28 ENCOUNTER — Emergency Department (HOSPITAL_COMMUNITY): Payer: Medicare Other

## 2016-12-28 ENCOUNTER — Encounter (HOSPITAL_COMMUNITY): Payer: Self-pay

## 2016-12-28 ENCOUNTER — Telehealth: Payer: Self-pay | Admitting: Family Medicine

## 2016-12-28 ENCOUNTER — Inpatient Hospital Stay (HOSPITAL_COMMUNITY)
Admission: EM | Admit: 2016-12-28 | Discharge: 2017-01-02 | DRG: 871 | Disposition: A | Discharge status: Home / Self Care | Payer: Medicare Other | Attending: Family Medicine | Admitting: Family Medicine

## 2016-12-28 DIAGNOSIS — R05 Cough: Secondary | ICD-10-CM | POA: Diagnosis not present

## 2016-12-28 DIAGNOSIS — I4510 Unspecified right bundle-branch block: Secondary | ICD-10-CM | POA: Diagnosis present

## 2016-12-28 DIAGNOSIS — I509 Heart failure, unspecified: Secondary | ICD-10-CM | POA: Diagnosis not present

## 2016-12-28 DIAGNOSIS — R0600 Dyspnea, unspecified: Secondary | ICD-10-CM | POA: Diagnosis not present

## 2016-12-28 DIAGNOSIS — M6281 Muscle weakness (generalized): Secondary | ICD-10-CM

## 2016-12-28 DIAGNOSIS — I083 Combined rheumatic disorders of mitral, aortic and tricuspid valves: Secondary | ICD-10-CM | POA: Diagnosis present

## 2016-12-28 DIAGNOSIS — I350 Nonrheumatic aortic (valve) stenosis: Secondary | ICD-10-CM | POA: Diagnosis not present

## 2016-12-28 DIAGNOSIS — E872 Acidosis, unspecified: Secondary | ICD-10-CM | POA: Diagnosis present

## 2016-12-28 DIAGNOSIS — R0603 Acute respiratory distress: Secondary | ICD-10-CM | POA: Diagnosis not present

## 2016-12-28 DIAGNOSIS — N184 Chronic kidney disease, stage 4 (severe): Secondary | ICD-10-CM | POA: Diagnosis not present

## 2016-12-28 DIAGNOSIS — I10 Essential (primary) hypertension: Secondary | ICD-10-CM | POA: Diagnosis not present

## 2016-12-28 DIAGNOSIS — R509 Fever, unspecified: Secondary | ICD-10-CM | POA: Diagnosis not present

## 2016-12-28 DIAGNOSIS — I272 Pulmonary hypertension, unspecified: Secondary | ICD-10-CM | POA: Diagnosis present

## 2016-12-28 DIAGNOSIS — Z9981 Dependence on supplemental oxygen: Secondary | ICD-10-CM | POA: Diagnosis not present

## 2016-12-28 DIAGNOSIS — Z7982 Long term (current) use of aspirin: Secondary | ICD-10-CM

## 2016-12-28 DIAGNOSIS — I5032 Chronic diastolic (congestive) heart failure: Secondary | ICD-10-CM | POA: Diagnosis present

## 2016-12-28 DIAGNOSIS — I482 Chronic atrial fibrillation: Secondary | ICD-10-CM | POA: Diagnosis not present

## 2016-12-28 DIAGNOSIS — D469 Myelodysplastic syndrome, unspecified: Secondary | ICD-10-CM | POA: Diagnosis present

## 2016-12-28 DIAGNOSIS — A419 Sepsis, unspecified organism: Secondary | ICD-10-CM | POA: Diagnosis not present

## 2016-12-28 DIAGNOSIS — J9621 Acute and chronic respiratory failure with hypoxia: Secondary | ICD-10-CM | POA: Diagnosis not present

## 2016-12-28 DIAGNOSIS — J1100 Influenza due to unidentified influenza virus with unspecified type of pneumonia: Secondary | ICD-10-CM | POA: Diagnosis present

## 2016-12-28 DIAGNOSIS — Z66 Do not resuscitate: Secondary | ICD-10-CM | POA: Diagnosis present

## 2016-12-28 DIAGNOSIS — Z79899 Other long term (current) drug therapy: Secondary | ICD-10-CM

## 2016-12-28 DIAGNOSIS — L8990 Pressure ulcer of unspecified site, unspecified stage: Secondary | ICD-10-CM | POA: Insufficient documentation

## 2016-12-28 DIAGNOSIS — I130 Hypertensive heart and chronic kidney disease with heart failure and stage 1 through stage 4 chronic kidney disease, or unspecified chronic kidney disease: Secondary | ICD-10-CM | POA: Diagnosis present

## 2016-12-28 DIAGNOSIS — Z23 Encounter for immunization: Secondary | ICD-10-CM

## 2016-12-28 DIAGNOSIS — G3184 Mild cognitive impairment, so stated: Secondary | ICD-10-CM | POA: Diagnosis present

## 2016-12-28 DIAGNOSIS — E039 Hypothyroidism, unspecified: Secondary | ICD-10-CM | POA: Diagnosis present

## 2016-12-28 DIAGNOSIS — K449 Diaphragmatic hernia without obstruction or gangrene: Secondary | ICD-10-CM | POA: Diagnosis present

## 2016-12-28 DIAGNOSIS — Z881 Allergy status to other antibiotic agents status: Secondary | ICD-10-CM | POA: Diagnosis not present

## 2016-12-28 DIAGNOSIS — R0902 Hypoxemia: Secondary | ICD-10-CM | POA: Diagnosis not present

## 2016-12-28 DIAGNOSIS — R0602 Shortness of breath: Secondary | ICD-10-CM | POA: Diagnosis not present

## 2016-12-28 LAB — URINALYSIS, ROUTINE W REFLEX MICROSCOPIC
BILIRUBIN URINE: NEGATIVE
GLUCOSE, UA: NEGATIVE mg/dL
HGB URINE DIPSTICK: NEGATIVE
KETONES UR: NEGATIVE mg/dL
LEUKOCYTES UA: NEGATIVE
Nitrite: NEGATIVE
PROTEIN: NEGATIVE mg/dL
Specific Gravity, Urine: 1.013 (ref 1.005–1.030)
pH: 5 (ref 5.0–8.0)

## 2016-12-28 LAB — CBC WITH DIFFERENTIAL/PLATELET
BASOS PCT: 1 %
Basophils Absolute: 0 10*3/uL (ref 0.0–0.1)
EOS ABS: 0.1 10*3/uL (ref 0.0–0.7)
Eosinophils Relative: 1 %
HCT: 28.5 % — ABNORMAL LOW (ref 36.0–46.0)
HEMOGLOBIN: 9 g/dL — AB (ref 12.0–15.0)
Lymphocytes Relative: 16 %
Lymphs Abs: 0.9 10*3/uL (ref 0.7–4.0)
MCH: 29.9 pg (ref 26.0–34.0)
MCHC: 31.6 g/dL (ref 30.0–36.0)
MCV: 94.7 fL (ref 78.0–100.0)
MONOS PCT: 9 %
Monocytes Absolute: 0.5 10*3/uL (ref 0.1–1.0)
NEUTROS PCT: 73 %
Neutro Abs: 3.8 10*3/uL (ref 1.7–7.7)
Platelets: 177 10*3/uL (ref 150–400)
RBC: 3.01 MIL/uL — ABNORMAL LOW (ref 3.87–5.11)
RDW: 16.6 % — ABNORMAL HIGH (ref 11.5–15.5)
WBC: 5.3 10*3/uL (ref 4.0–10.5)

## 2016-12-28 LAB — COMPREHENSIVE METABOLIC PANEL
ALT: 10 U/L — ABNORMAL LOW (ref 14–54)
ANION GAP: 10 (ref 5–15)
AST: 18 U/L (ref 15–41)
Albumin: 3.6 g/dL (ref 3.5–5.0)
Alkaline Phosphatase: 55 U/L (ref 38–126)
BILIRUBIN TOTAL: 0.8 mg/dL (ref 0.3–1.2)
BUN: 30 mg/dL — ABNORMAL HIGH (ref 6–20)
CO2: 25 mmol/L (ref 22–32)
Calcium: 9.6 mg/dL (ref 8.9–10.3)
Chloride: 105 mmol/L (ref 101–111)
Creatinine, Ser: 1.81 mg/dL — ABNORMAL HIGH (ref 0.44–1.00)
GFR, EST AFRICAN AMERICAN: 26 mL/min — AB (ref >60)
GFR, EST NON AFRICAN AMERICAN: 23 mL/min — AB (ref >60)
Glucose, Bld: 155 mg/dL — ABNORMAL HIGH (ref 65–99)
POTASSIUM: 4.7 mmol/L (ref 3.5–5.1)
Sodium: 140 mmol/L (ref 135–145)
TOTAL PROTEIN: 7.1 g/dL (ref 6.5–8.1)

## 2016-12-28 LAB — I-STAT CG4 LACTIC ACID, ED
LACTIC ACID, VENOUS: 2.05 mmol/L — AB (ref 0.5–1.9)
Lactic Acid, Venous: 6.08 mmol/L (ref 0.5–1.9)

## 2016-12-28 LAB — PROTIME-INR
INR: 1.13
PROTHROMBIN TIME: 14.6 s (ref 11.4–15.2)

## 2016-12-28 LAB — LACTIC ACID, PLASMA
LACTIC ACID, VENOUS: 4.8 mmol/L — AB (ref 0.5–1.9)
Lactic Acid, Venous: 3.7 mmol/L (ref 0.5–1.9)

## 2016-12-28 LAB — TROPONIN I
Troponin I: 0.06 ng/mL (ref <0.03)
Troponin I: 0.14 ng/mL (ref <0.03)

## 2016-12-28 LAB — BRAIN NATRIURETIC PEPTIDE: B Natriuretic Peptide: 573.8 pg/mL — ABNORMAL HIGH (ref 0.0–100.0)

## 2016-12-28 LAB — INFLUENZA PANEL BY PCR (TYPE A & B)
Influenza A By PCR: NEGATIVE
Influenza B By PCR: NEGATIVE

## 2016-12-28 MED ORDER — AZTREONAM 1 G IJ SOLR
1.0000 g | Freq: Three times a day (TID) | INTRAMUSCULAR | Status: DC
  Filled 2016-12-28: qty 1

## 2016-12-28 MED ORDER — ALBUTEROL SULFATE (2.5 MG/3ML) 0.083% IN NEBU
2.5000 mg | INHALATION_SOLUTION | RESPIRATORY_TRACT | Status: DC | PRN
  Administered 2016-12-28: 2.5 mg via RESPIRATORY_TRACT
  Filled 2016-12-28: qty 3

## 2016-12-28 MED ORDER — SODIUM CHLORIDE 0.9 % IV BOLUS (SEPSIS)
1000.0000 mL | Freq: Once | INTRAVENOUS | Status: AC
  Administered 2016-12-28: 1000 mL via INTRAVENOUS

## 2016-12-28 MED ORDER — ACETAMINOPHEN 325 MG PO TABS
650.0000 mg | ORAL_TABLET | Freq: Four times a day (QID) | ORAL | Status: DC | PRN
  Administered 2016-12-29 (×2): 650 mg via ORAL
  Filled 2016-12-28 (×2): qty 2

## 2016-12-28 MED ORDER — VANCOMYCIN HCL IN DEXTROSE 1-5 GM/200ML-% IV SOLN
1000.0000 mg | Freq: Once | INTRAVENOUS | Status: AC
  Administered 2016-12-28: 1000 mg via INTRAVENOUS
  Filled 2016-12-28: qty 200

## 2016-12-28 MED ORDER — HEPARIN SODIUM (PORCINE) 5000 UNIT/ML IJ SOLN
5000.0000 [IU] | Freq: Three times a day (TID) | INTRAMUSCULAR | Status: DC
  Administered 2016-12-28 – 2017-01-02 (×15): 5000 [IU] via SUBCUTANEOUS
  Filled 2016-12-28 (×16): qty 1

## 2016-12-28 MED ORDER — OSELTAMIVIR PHOSPHATE 30 MG PO CAPS
30.0000 mg | ORAL_CAPSULE | Freq: Every day | ORAL | Status: AC
  Administered 2016-12-28 – 2017-01-01 (×5): 30 mg via ORAL
  Filled 2016-12-28 (×5): qty 1

## 2016-12-28 MED ORDER — ALBUTEROL (5 MG/ML) CONTINUOUS INHALATION SOLN
10.0000 mg/h | INHALATION_SOLUTION | Freq: Once | RESPIRATORY_TRACT | Status: AC
  Administered 2016-12-28: 10 mg/h via RESPIRATORY_TRACT
  Filled 2016-12-28: qty 20

## 2016-12-28 MED ORDER — SODIUM CHLORIDE 0.9 % IV SOLN: INTRAVENOUS | Status: DC

## 2016-12-28 MED ORDER — SODIUM CHLORIDE 0.9 % IV BOLUS (SEPSIS)
1000.0000 mL | Freq: Once | INTRAVENOUS | Status: AC
Start: 2016-12-28 — End: 2016-12-28
  Administered 2016-12-28: 1000 mL via INTRAVENOUS

## 2016-12-28 MED ORDER — DEXTROSE 5 % IV SOLN
1.0000 g | Freq: Three times a day (TID) | INTRAVENOUS | Status: DC
  Administered 2016-12-28 – 2016-12-31 (×9): 1 g via INTRAVENOUS
  Filled 2016-12-28 (×11): qty 1

## 2016-12-28 MED ORDER — SODIUM CHLORIDE 0.9% FLUSH
3.0000 mL | Freq: Two times a day (BID) | INTRAVENOUS | Status: DC
Start: 2016-12-28 — End: 2017-01-02
  Administered 2016-12-28 – 2017-01-02 (×10): 3 mL via INTRAVENOUS

## 2016-12-28 MED ORDER — POLYETHYLENE GLYCOL 3350 17 G PO PACK: 17.0000 g | PACK | Freq: Every day | ORAL | Status: DC | PRN

## 2016-12-28 MED ORDER — VANCOMYCIN HCL IN DEXTROSE 1-5 GM/200ML-% IV SOLN
1000.0000 mg | INTRAVENOUS | Status: DC
  Administered 2016-12-30: 1000 mg via INTRAVENOUS
  Filled 2016-12-28: qty 200

## 2016-12-28 MED ORDER — LEVOTHYROXINE SODIUM 100 MCG PO TABS
100.0000 ug | ORAL_TABLET | Freq: Every day | ORAL | Status: DC
  Administered 2016-12-29 – 2017-01-02 (×5): 100 ug via ORAL
  Filled 2016-12-28 (×5): qty 1

## 2016-12-28 MED ORDER — ASPIRIN EC 81 MG PO TBEC
81.0000 mg | DELAYED_RELEASE_TABLET | Freq: Every day | ORAL | Status: DC
Start: 2016-12-28 — End: 2017-01-02
  Administered 2016-12-29 – 2017-01-02 (×5): 81 mg via ORAL
  Filled 2016-12-28 (×6): qty 1

## 2016-12-28 MED ORDER — SODIUM CHLORIDE 0.9 % IV BOLUS (SEPSIS): 500.0000 mL | Freq: Once | INTRAVENOUS | Status: DC

## 2016-12-28 MED ORDER — ACETAMINOPHEN 650 MG RE SUPP
650.0000 mg | Freq: Four times a day (QID) | RECTAL | Status: DC | PRN
Start: 2016-12-28 — End: 2017-01-02

## 2016-12-28 NOTE — ED Notes (Signed)
Main lab contacted to send flu swab

## 2016-12-28 NOTE — Telephone Encounter (Signed)
Please contact patient/daughter and have them make a follow up appointment with Dr. Alvy Bimler.

## 2016-12-28 NOTE — Telephone Encounter (Signed)
LMOVM for pt to call us back. Deseree Blount, CMA  

## 2016-12-28 NOTE — H&P (Signed)
[facility] Admission History and Physical This is a Med student note. For full H&P, please see cosigned resident's note. Service Pager: [phone]  Patient name: Gina Wilkinson Medical record number: [id number] Date of birth: 11-02-1920 Age: 83 y.o. Gender: female  Primary Care Provider: Lupita Dawn, MD Consultants: None Code Status: DNR/DNI, confirmed on admission  Chief Complaint: shortness of breath, fever, weakness  Assessment and Plan: [name] is a 83 y.o. female presenting with dyspnea, generalized weakness and fever, found to have acute on chronic respiratory failure necessitating increased supplemental O2 and lactic acidosis on admission. PMH is significant for HFpEF with PAH, aortic stenosis, atrial fibrillation, RBBB, pulmonary HTN, myelodysplastic syndrome and hypothyroidism.  #Acute on chronic respiratory failure  generalized weakness  fever of unknown origin: Most likely 2/2 CHF exacerbation vs respiratory virus vs anemia vs worsening aortic stenosis. Patient reports that she felt she was working harder to breathe this morning when she woke up feeling warm and weak. Recent home health worker who was flu positive. Tmax at that time was 101.2. Denies recent sickness and she endorses slight cough today that is nonproductive. Otherwise infectious ROS is negative. She was satting 84% on 6L, now improving. Uses 2L O2 at baseline during the night and intermittently through the day. Vitals significant for some soft BPs to 92/56 with normal HR. Physical exam with systolic ejection murmur, cap refill 1 sec, MMM, some crackles at the lung bases. On admission, labs significant for negative flu PCR, no leukocytosis, Hgb 9.0 (baseline ~10), lactic acid 2.05, BNP 573.8, troponin 0.06. CXR negative for PNA, showing cardiomegaly and mild interstitial edema. UA wnl. In ED received 2x 1L bolus NS, albuterol neb, and was started on aztreonam and  vancomycin. - Monitor fluid status, consider mIVF if appropriate after 30 mL/kg bolus - F/u BCx - Daily CBC, BMP - Echo - Trend troponin - Trend lactic acid - Respiratory viral panel - Telemetry - Vitals q4h  - Continue aztreonam and vancomycin, narrow pending BCx - Start tamiflu, decreased dose for CrCl - Continue supplemental O2, wean as tolerated - Albuterol nebs prn for dyspnea  #Acute on chronic kidney injury: Cr 1.81 (last 1.55 on 10/03/16). S/p 2L NS in ED. - Daily BMP as above  ##CHRONIC #HFpEF with PAH: echo as above. Hold home lasix 10 mg daily. #HTN: hold home amlodipine 5 mg in setting of low BPs. Continue ASA 81 mg. #Hypothyroidism: Last TSH 0.13 on 10/03/16. Continue 171mcg synthroid daily. #Myelodysplastic syndrome: oncology f/u as outpatient given anemia, fever, weakness.  FEN/GI: - KVO peripheral IV x2 - Monitor electrolytes, replete as needed. - Regular diet. Prophylaxis: lovenox  Disposition: Admit to floor under supervision of Dr. Chrisandra Netters.  History of Present Illness:  Gina Wilkinson is a 83 y.o. female presenting with fever, shortness of breath, and weakness x 1 day. She went to bed feeling fine last night, but woke up feeling warm and like she was working a little harder to breathe than normal. She uses O2 2L prn most nights and intermittently during the day. Tmax was 101.3 and took a tylenol afterward. Home health worker recently was flu positive, so daughter called EMS to bring pt to the hospital.  Denies recent illness. Endorses minimal cough that is nonproductive today. Denies HA, congestion, muscle aches, change in vision, chest pain, diarrhea, constipation, n/v, dysuria, swelling of feet/ankles, sores or open wounds.  In the ED, was found to have some soft pressures  to low 90's SBP and was desatting to 70s. Given albuterol nebulizer. Lactic acid 2.05. UA and CXR not concerning for infection. BNP 573.8, troponin 0.06, EKG with AFib and RBBB,  and no acute ischemic changes. Started on aztreonam and vancomycin, given extensive antibiotic allergies. Received 2 x 1L bolus NS per sepsis protocol.  Review Of Systems: Per HPI with the following additions:  Review of Systems  Constitutional: Negative for chills and weight loss.  HENT: Negative for congestion and sore throat.   Eyes: Negative for blurred vision.  Musculoskeletal: Negative for falls and myalgias.  Psychiatric/Behavioral: Negative for substance abuse.    Patient Active Problem List   Diagnosis Date Noted  . Sepsis ([place]) 12/28/2016  . Hematoma 05/11/2016  . 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 DNR (do not resuscitate) 02/24/2016  . Encephalopathy 02/03/2016  . Severe aortic stenosis   . Permanent atrial fibrillation ([place]) 01/18/2016  . CKD (chronic kidney disease), stage IV ([place]) 01/18/2016  . Elevated troponin 01/18/2016  . Chronic respiratory failure ([place]) 01/18/2016  . Bradycardia 01/18/2016  . Abdominal pain   . AKI (acute kidney injury) ([place])   . Acute on chronic congestive heart failure ([place])   . Chest pain 01/17/2016  . Preventative health care 06/06/2015  . Elevated serum creatinine 03/23/2015  . Anemia 07/27/2014  . Aortic stenosis 12/08/2013  . Essential hypertension 12/08/2013  . Pulmonary HTN ([place]) 12/08/2013  . Hip fracture ([place]) 11/09/2013  . Chronic diastolic heart failure ([place]) 11/09/2013  . RBBB 11/09/2013  . MDS (myelodysplastic syndrome) ([place]) 10/05/2011  . Hypothyroidism 02/15/2009    Past Medical History: Past Medical History:  Diagnosis Date  . Acute respiratory failure with hypoxia ([place])   . Anemia, unspecified 07/27/2014  . Aortic stenosis   . Chronic diastolic CHF (congestive heart failure) ([place])   . Chronic respiratory failure (HCC)    a. on home O2 qhs and PRN.  . CKD (chronic kidney disease), stage IV ([place])    a. per review of historical labs.  . Hypertension   . Hypothyroidism   . MDS (myelodysplastic syndrome)  ([employer]) 10/05/2011  . Mild cognitive impairment   . Moderate to severe pulmonary hypertension   . Permanent atrial fibrillation ([place]) dx Mar. 2010   a. Not on anticoag due to myelodysplastic syndrome.  . Pneumonia   . Pulmonary hypertension   . RBBB     Past Surgical History: Past Surgical History:  Procedure Laterality Date  . APPENDECTOMY    . ORIF FEMUR FRACTURE  2001   left  . ORIF PERIPROSTHETIC FRACTURE Left 11/12/2013   Procedure: OPEN REDUCTION INTERNAL FIXATION (ORIF) LEFT HIP PERIPROSTHETIC FRACTURE,;  Surgeon: Marianna Payment, MD;  Location: [facility];  Service: Orthopedics;  Laterality: Left;    Social History: Social History  Substance Use Topics  . Smoking status: Never Smoker  . Smokeless tobacco: Never Used  . Alcohol use No   Additional social history: Lives at home with 24 hour monitoring.  Please also refer to relevant sections of EMR.  Family History: Family History  Problem Relation Age of Onset  . Appendicitis Mother     Died age 34s-60s of ruptured appendix   Allergies and Medications: Allergies  Allergen Reactions  . Baclofen Other (See Comments)    Severe altered mental status, extreme sedation   . Levaquin [Levofloxacin] Anaphylaxis  . Penicillins Anaphylaxis    Has patient had a PCN reaction causing immediate rash, facial/tongue/throat swelling, SOB or lightheadedness with hypotension: Yes  Has patient had a PCN reaction causing severe rash involving mucus membranes or skin necrosis: No Has patient had a PCN reaction that required hospitalization No Has patient had a PCN reaction occurring within the last 10 years: No If all of the above answers are "NO", then may proceed with Cephalosporin use.   . Sulfonamide Derivatives Anaphylaxis   No current facility-administered medications on file prior to encounter.    Current Outpatient Prescriptions on File Prior to Encounter  Medication Sig Dispense Refill  . amLODipine (NORVASC) 5 MG tablet  TAKE 1 TABLET (5 MG TOTAL) BY MOUTH DAILY. 90 tablet 1  . aspirin 81 MG tablet Take 81 mg by mouth daily.    . Cholecalciferol (VITAMIN D PO) Take 5,000 Units by mouth daily.    . Cyanocobalamin (VITAMIN B-12 PO) Take 3,000 mg by mouth daily.    . furosemide (LASIX) 20 MG tablet TAKE 1/2 A TABLET BY MOUTH EVERY DAY 30 tablet 3  . levothyroxine (SYNTHROID, LEVOTHROID) 100 MCG tablet Take 1 tablet (100 mcg total) by mouth daily. (Patient taking differently: Take 100 mcg by mouth daily. BRAND NAME ONLY) 90 tablet 1  . NON FORMULARY Oxygen 2 Liters every night and as needed      Objective: BP 92/56   Pulse 62   Temp 98.7 F (37.1 C) (Oral)   Resp 21   Ht 5\' 6"  (1.676 m)   Wt 61.2 kg (135 lb)   SpO2 98%   BMI 21.79 kg/m  Exam: General: Thin woman, sitting up in bed with O2 [place] in place, pleasant, cooperative. Eyes: Some puffiness under the eyes, PERRLA, EOMI. ENTM: MMM. Neck: No anterior cervical or supraclavicular LAD. Cardiovascular: Normal rate, irregular rhythm. Systolic ejection murmur most loud at RUSB. 2+ radial and DP pulses b/l. No periperal edema. Cap refill 1 sec. Respiratory: Moves air well. No wheezes. Crackles at the bases b/l. Gastrointestinal: NABS. Nontender and nondistended. Derm: Thin skin with dry patches and multiple echymoses. Feet with no ulcers. Neuro: Strength 5/5 throughout. AOx3.  Labs and Imaging: CBC BMET   Recent Labs Lab 12/28/16 1018  WBC 5.3  HGB 9.0*  HCT 28.5*  PLT 177    Recent Labs Lab 12/28/16 1018  NA 140  K 4.7  CL 105  CO2 25  BUN 30*  CREATININE 1.81*  GLUCOSE 155*  CALCIUM 9.6    - INR 1.13 - Lactic acid 2.05 - Influenza A and B negative - BNP 573.8 - Trop 0.06 - UA wnl - EKG: personally reviewed. AFib, RBBB, no acute ischemic changes. - CXR: cardiomegaly, mild interstitial edema, large hiatal hernia.  Rosario Adie, Medical Student 12/28/2016, 3:46 PM [place] Intern pager: [phone], text pages welcome

## 2016-12-28 NOTE — ED Triage Notes (Signed)
Member brought in EMS for fever that began last night.  Highest fever was 101.3.  1g of Tylenol given by EMS.  Pt caretaker tested positive for flu.  PT denies any symptoms at this time.  Pt O2 sat 71% RA, 98% on neb tx.  Pt denies any SOB

## 2016-12-28 NOTE — ED Provider Notes (Signed)
[facility] DEPT Provider Note   CSN: [id number] Arrival date & time: 12/28/16  [id number]   History   Chief Complaint Chief Complaint  Patient presents with  . Fever    HPI Gina Wilkinson is a 83 y.o. female.  HPI   83 year old female presents today with complaints of fever and fatigue. Patient's daughter and caregiver at bedside who reports that 3 days ago one of her caretakers tested positive for influenza. They note last night patient had fatigue, elevated temperature. This morning patient was again febrile, and hypoxic on room air. Therefore patient wears oxygen at night, and intermittently throughout today with various activities. They note after short ambulation in the house patient was hypoxic down to the 80s on room air and was placed on [facility]. EMS gave albuterol treatment during transport. Patient has no specific complaints at the time of my evaluation, reports she's feeling well. She denies any shortness of breath, chest pain, abdominal pain, sore throat, headache, or any changes in the color clarity, or characteristics of her urine or bowel movements.    Past Medical History:  Diagnosis Date  . Acute respiratory failure with hypoxia ([place])   . Anemia, unspecified 07/27/2014  . Aortic stenosis   . Chronic diastolic CHF (congestive heart failure) ([place])   . Chronic respiratory failure (HCC)    a. on home O2 qhs and PRN.  . CKD (chronic kidney disease), stage IV ([place])    a. per review of historical labs.  . Hypertension   . Hypothyroidism   . MDS (myelodysplastic syndrome) ([place]) 10/05/2011  . Mild cognitive impairment   . Moderate to severe pulmonary hypertension   . Permanent atrial fibrillation ([place]) dx Mar. 2010   a. Not on anticoag due to myelodysplastic syndrome.  . Pneumonia   . Pulmonary hypertension   . RBBB     Patient Active Problem List   Diagnosis Date Noted  . Sepsis ([place]) 12/28/2016  . Lactic acidosis 12/28/2016  . Hematoma 05/11/2016  .  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 DNR (do not resuscitate) 02/24/2016  . Encephalopathy 02/03/2016  . Severe aortic stenosis   . Permanent atrial fibrillation ([place]) 01/18/2016  . CKD (chronic kidney disease), stage IV ([place]) 01/18/2016  . Elevated troponin 01/18/2016  . Chronic respiratory failure ([place]) 01/18/2016  . Bradycardia 01/18/2016  . Abdominal pain   . AKI (acute kidney injury) ([place])   . Acute on chronic congestive heart failure ([place])   . Chest pain 01/17/2016  . Preventative health care 06/06/2015  . Elevated serum creatinine 03/23/2015  . Anemia 07/27/2014  . Aortic stenosis 12/08/2013  . Essential hypertension 12/08/2013  . Pulmonary HTN ([place]) 12/08/2013  . Hip fracture ([place]) 11/09/2013  . Chronic diastolic heart failure ([place]) 11/09/2013  . RBBB 11/09/2013  . MDS (myelodysplastic syndrome) ([place]) 10/05/2011  . Hypothyroidism 02/15/2009    Past Surgical History:  Procedure Laterality Date  . APPENDECTOMY    . ORIF FEMUR FRACTURE  2001   left  . ORIF PERIPROSTHETIC FRACTURE Left 11/12/2013   Procedure: OPEN REDUCTION INTERNAL FIXATION (ORIF) LEFT HIP PERIPROSTHETIC FRACTURE,;  Surgeon: Marianna Payment, MD;  Location: [facility];  Service: Orthopedics;  Laterality: Left;    OB History    No data available       Home Medications    Prior to Admission medications   Medication Sig Start Date End Date Taking? Authorizing Provider  amLODipine (NORVASC) 5 MG tablet TAKE 1 TABLET (5 MG TOTAL) BY  MOUTH DAILY. 08/10/16  Yes Lupita Dawn, MD  aspirin 81 MG tablet Take 81 mg by mouth daily.   Yes Historical Provider, MD  Cholecalciferol (VITAMIN D PO) Take 5,000 Units by mouth daily.   Yes Historical Provider, MD  Cyanocobalamin (VITAMIN B-12 PO) Take 3,000 mg by mouth daily.   Yes Historical Provider, MD  furosemide (LASIX) 20 MG tablet TAKE 1/2 A TABLET BY MOUTH EVERY DAY 09/17/16  Yes Lupita Dawn, MD  levothyroxine (SYNTHROID, LEVOTHROID) 100 MCG tablet  Take 1 tablet (100 mcg total) by mouth daily. Patient taking differently: Take 100 mcg by mouth daily. BRAND NAME ONLY 10/05/16  Yes Lupita Dawn, MD  NON FORMULARY Oxygen 2 Liters every night and as needed   Yes Historical Provider, MD    Family History Family History  Problem Relation Age of Onset  . Appendicitis Mother     Died age 24s-60s of ruptured appendix    Social History Social History  Substance Use Topics  . Smoking status: Never Smoker  . Smokeless tobacco: Never Used  . Alcohol use No     Allergies   Baclofen; Levaquin [levofloxacin]; Penicillins; and Sulfonamide derivatives   Review of Systems Review of Systems  All other systems reviewed and are negative.   Physical Exam Updated Vital Signs BP (!) 111/52 (BP Location: Left Arm)   Pulse 99   Temp 98 F (36.7 C) (Oral)   Resp (!) 21   Ht 5\' 6"  (1.676 m)   Wt 64.4 kg   SpO2 94%   BMI 22.90 kg/m   Physical Exam  Constitutional: She is oriented to person, place, and time. She appears well-developed and well-nourished.  HENT:  Head: Normocephalic and atraumatic.  Eyes: Conjunctivae are normal. Pupils are equal, round, and reactive to light. Right eye exhibits no discharge. Left eye exhibits no discharge. No scleral icterus.  Neck: Normal range of motion. No JVD present. No tracheal deviation present.  Cardiovascular:  Irregularly irregular rhythm    Pulmonary/Chest: Effort normal. No stridor. She has wheezes.  Bilateral lower lobe wheeze   Abdominal: Soft. She exhibits no distension. There is no tenderness.  Musculoskeletal:  No edema to lower extremities   Neurological: She is alert and oriented to person, place, and time. Coordination normal.  Skin: Skin is warm.  Psychiatric: She has a normal mood and affect. Her behavior is normal. Judgment and thought content normal.  Nursing note and vitals reviewed.    ED Treatments / Results  Labs (all labs ordered are listed, but only abnormal  results are displayed) Labs Reviewed  COMPREHENSIVE METABOLIC PANEL - Abnormal; Notable for the following:       Result Value   Glucose, Bld 155 (*)    BUN 30 (*)    Creatinine, Ser 1.81 (*)    ALT 10 (*)    GFR calc non Af Amer 23 (*)    GFR calc Af Amer 26 (*)    All other components within normal limits  CBC WITH DIFFERENTIAL/PLATELET - Abnormal; Notable for the following:    RBC 3.01 (*)    Hemoglobin 9.0 (*)    HCT 28.5 (*)    RDW 16.6 (*)    All other components within normal limits  BRAIN NATRIURETIC PEPTIDE - Abnormal; Notable for the following:    B Natriuretic Peptide 573.8 (*)    All other components within normal limits  TROPONIN I - Abnormal; Notable for the following:    Troponin I  0.06 (*)    All other components within normal limits  I-STAT CG4 LACTIC ACID, ED - Abnormal; Notable for the following:    Lactic Acid, Venous 2.05 (*)    All other components within normal limits  I-STAT CG4 LACTIC ACID, ED - Abnormal; Notable for the following:    Lactic Acid, Venous 6.08 (*)    All other components within normal limits  URINE CULTURE  CULTURE, BLOOD (ROUTINE X 2)  CULTURE, BLOOD (ROUTINE X 2)  RESPIRATORY PANEL BY PCR  PROTIME-INR  URINALYSIS, ROUTINE W REFLEX MICROSCOPIC  INFLUENZA PANEL BY PCR (TYPE A & B)  TROPONIN I  TROPONIN I  TROPONIN I  BASIC METABOLIC PANEL  CBC  LACTIC ACID, PLASMA  LACTIC ACID, PLASMA  I-STAT CG4 LACTIC ACID, ED    EKG  EKG Interpretation  Date/Time:  Friday December 28 2016 10:07:38 EST Ventricular Rate:  51 PR Interval:    QRS Duration: 163 QT Interval:  495 QTC Calculation: 456 R Axis:   128 Text Interpretation:  Atrial fibrillation Right bundle branch block Lateral infarct, age indeterminate No significant change was found Confirmed by CAMPOS  MD, Lennette Bihari ([id number]) on 12/28/2016 10:24:00 AM       Radiology Dg Chest Portable 1 View  Result Date: 12/28/2016 CLINICAL DATA:  Decreased oxygen saturation. EXAM: PORTABLE  CHEST 1 VIEW COMPARISON:  PA and lateral chest 01/17/2016. FINDINGS: There is cardiomegaly and mild interstitial edema. No consolidative process, pneumothorax or effusion. Large hiatal hernia is noted. IMPRESSION: Cardiomegaly and mild interstitial edema. Large hiatal hernia. Electronically Signed   By: Inge Rise M.D.   On: 12/28/2016 11:07    Procedures Procedures (including critical care time)  Medications Ordered in ED Medications  vancomycin (VANCOCIN) IVPB 1000 mg/200 mL premix (not administered)  aztreonam (AZACTAM) 1 g in dextrose 5 % 50 mL IVPB (0 g Intravenous Stopped 12/28/16 1457)  levothyroxine (SYNTHROID, LEVOTHROID) tablet 100 mcg (not administered)  aspirin EC tablet 81 mg (not administered)  heparin injection 5,000 Units (not administered)  sodium chloride flush (NS) 0.9 % injection 3 mL (not administered)  acetaminophen (TYLENOL) tablet 650 mg (not administered)    Or  acetaminophen (TYLENOL) suppository 650 mg (not administered)  polyethylene glycol (MIRALAX / GLYCOLAX) packet 17 g (not administered)  oseltamivir (TAMIFLU) capsule 30 mg (not administered)  albuterol (PROVENTIL) (2.5 MG/3ML) 0.083% nebulizer solution 2.5 mg (not administered)  albuterol (PROVENTIL,VENTOLIN) solution continuous neb (10 mg/hr Nebulization Given 12/28/16 1050)  sodium chloride 0.9 % bolus 1,000 mL (0 mLs Intravenous Stopped 12/28/16 1154)  vancomycin (VANCOCIN) IVPB 1000 mg/200 mL premix (0 mg Intravenous Stopped 12/28/16 1425)  sodium chloride 0.9 % bolus 1,000 mL (1,000 mLs Intravenous New Bag/Given 12/28/16 1258)     Initial Impression / Assessment and Plan / ED Course  I have reviewed the triage vital signs and the nursing notes.  Pertinent labs & imaging results that were available during my care of the patient were reviewed by me and considered in my medical decision making (see chart for details).     Final Clinical Impressions(s) / ED Diagnoses   Final diagnoses:  Fever,  unspecified fever cause   Labs: Sepsis order set, influenza  Imaging: EKG, DG chest 2 view  Consults:  Therapeutics: Normal saline, cefepime, aztreonam and vancomycin  Discharge Meds:   Assessment/Plan:  83 year old female presents today with fever; Likely secondary to upper respiratory infection. Patient does have very minor upper respiratory complaints. She has an elevated temperature, hypoxic on room  air. Patient was given breathing treatments here in the ED which temporarily improved her symptoms. Patient negative for influenza here. Patient started on broad-spectrum antibiotics after discussion with pharmacy. Family practice consulted for hospital admission.     New Prescriptions Current Discharge Medication List       Okey Regal, PA-C 12/28/16 [address] PA-C 12/28/16 [place], MD 01/01/17 [phone]

## 2016-12-28 NOTE — H&P (Signed)
[facility] Admission History and Physical Service Pager: [phone]  Patient name: Gina Wilkinson Medical record number: [id number] Date of birth: 04-18-1920 Age: 83 y.o. Gender: female  Primary Care Provider: Lupita Dawn, MD Consultants: none Code Status: DNR/DNI  Chief Complaint: fever, weakness  Assessment and Plan: Gina Wilkinson is a 83 y.o. female presenting with fever and weakness. PMH is significant for Myleodysplastic syndrome, hypothyroidism, diastolic HF, RBBB, aortic stenosis, HTN, anemia, atrial fibrillation, CKD stage 4, chronic respiratory failure  Dyspnea, acute on chronic hypoxic respiratory failure- patient with chronic respiratory failure on 2L O2 at home during sleep and with activity. Required non-rebreather in EMS. Saturations in low 80's on arrival to ED. Responded well to supplemental oxygen and breathing treatment. Saturating 92% on 5L O2 in ED. CXR negative for infiltrate, some evidence of interstitial edema. Mild crackles auscultated in bilateral lung bases. BNP elevated to 573. Troponin elevated to 0.06 likely demand ischemia, has been chronically elevated in past. EKG in atrial fibrillation, RBBB present. Differential for dyspnea include infectious process, worsening of respiratory fucntion, due to pulmonary hypertension or worsening valvular dysfunction, or  ACS.  -admit to telemetry, attending Dr. Ardelia Mems -continuous pulse ox -oxygen supplementation goal to keep O2 saturation over 90% -vitals per unit -up with assistance -am EKG -trend troponin -respiratory viral panel ordered -droplet precautions - treatment of potential infectious process as below -repeat Echo -am BMP and CBC  Subjective fever and weakness- No clear source for infection at this point. SIRS, QSOFA, Flu swab negative. Caretaker was diagnosed with flu earlier this week so she has close flu contact. Given low sensitivity of flu test, it is possible that  result was false negative. Lactic acid elevated to 2.05, could be in setting of infection or due to dehydration. CXR without consolidation. UA negative for leukocytes. Empirically started on vanc and aztreonam in ED for code sepsis, various antibiotic reactions. Currently receiving 24mL/kg bolus per Sepsis protocol. -blood and urine cultures pending -continue aztreonam and vancomycin per pharmacy, will de-escalate as able -monitor fever curve  - treat with Tamiflu for influenza-like illness, 30mg  BID given low CrCl -trend lactic acid  -respiratory viral panel  Afib- Stable. currently in afib, rate in 60's. -continue home aspirin 81 mg -monitor on telemetry  Hypothyroidism- takes synthroid daily. Last TSH from November 2017 0.13.  -continue home synthroid  CHF- BNP on admission 573, similar to BNP from 12/2015 of 548, not clinically fluid overloaded. Takes lasix daily, did take dose today. Last Echo February 2017 with EF 65-70%, severe aortic stenosis, pulmonary hypertension.  -hold home lasix in setting of soft BP's, can reassess volume status and restart tomorrow -s/p 32mL/kg bolus in ED, hold off on additional fluids for now -repeating Echo -daily weights, Is&Os  HTN- low BPs. on norvasc 5 mg daily, did not take dose this morning -hold home BP meds in setting of soft BP's  MDS (Myelodysplastic Syndrome)- unclear if this active issue. Has anemia. Surgical path report from 2010 without clear MDS diagnosis. WBC normal.  -monitor daily CBC - likely would benefit from f/u OP with Onc  Stage 4 CKD- creatinine baseline 1.5-1.9. Creatinine 1.81 in ED.  -gentle fluids as above, saline lock after this fluid bolus -avoid nephrotoxic agents  Normocytic Anemia- baseline appears to be 9-10. Hemoglobin 9 in ED. -monitor with daily CBC  FEN/GI: heart healthy diet Prophylaxis: heparin  Disposition: pending clinical improvement  History of Present Illness:  Gina Wilkinson is a 83 y.o. female  presenting with fever and weakness since last night.  Developed fever last night that responded to ibuprofen. She also feels weak. Usually "as strong as a horse". Can usually walk around well on her own. She felt warm this morning and took her temperature which was elevated to 101.2. Uses oxygen (2L) at home during the night and intermittently during the day. Feels she is working harder to breath since last night. Mild cough but not productive. No headaches. No dysuria. No diarrhea. Has regular bowel movements every day. Ambulatory at home, no bed sores. Has not been drinking very much usually, caregivers "need to be better about giving her water". Caregiver was diagnosed with the flu earlier this week, she was concerned she had the flu.  Review Of Systems: Per HPI with the following additions:  Review of Systems  Constitutional: Positive for fever.  HENT: Negative for congestion and sore throat.   Eyes: Negative for blurred vision and double vision.  Respiratory: Positive for cough and shortness of breath. Negative for sputum production.   Cardiovascular: Negative for chest pain and palpitations.  Gastrointestinal: Negative for abdominal pain, constipation and diarrhea.  Genitourinary: Negative for dysuria.  Musculoskeletal: Negative for falls and myalgias.  Neurological: Positive for weakness. Negative for dizziness, speech change, focal weakness, seizures and headaches.  Psychiatric/Behavioral: Negative for substance abuse.    Patient Active Problem List   Diagnosis Date Noted  . Sepsis ([place]) 12/28/2016  . Hematoma 05/11/2016  . 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 DNR (do not resuscitate) 02/24/2016  . Encephalopathy 02/03/2016  . Severe aortic stenosis   . Permanent atrial fibrillation ([place]) 01/18/2016  . CKD (chronic kidney disease), stage IV ([place]) 01/18/2016  . Elevated troponin 01/18/2016  . Chronic respiratory failure ([place]) 01/18/2016  . Bradycardia 01/18/2016  .  Abdominal pain   . AKI (acute kidney injury) ([place])   . Acute on chronic congestive heart failure ([place])   . Chest pain 01/17/2016  . Preventative health care 06/06/2015  . Elevated serum creatinine 03/23/2015  . Anemia 07/27/2014  . Aortic stenosis 12/08/2013  . Essential hypertension 12/08/2013  . Pulmonary HTN ([place]) 12/08/2013  . Hip fracture ([place]) 11/09/2013  . Chronic diastolic heart failure ([place]) 11/09/2013  . RBBB 11/09/2013  . MDS (myelodysplastic syndrome) ([place]) 10/05/2011  . Hypothyroidism 02/15/2009    Past Medical History: Past Medical History:  Diagnosis Date  . Acute respiratory failure with hypoxia ([place])   . Anemia, unspecified 07/27/2014  . Aortic stenosis   . Chronic diastolic CHF (congestive heart failure) ([place])   . Chronic respiratory failure ([facility])    a. on home O2 qhs and PRN.  . CKD (chronic kidney disease), stage IV ([place])    a. per review of historical labs.  . Hypertension   . Hypothyroidism   . MDS (myelodysplastic syndrome) ([place]) 10/05/2011  . Mild cognitive impairment   . Moderate to severe pulmonary hypertension   . Permanent atrial fibrillation ([place]) dx Mar. 2010   a. Not on anticoag due to myelodysplastic syndrome.  . Pneumonia   . Pulmonary hypertension   . RBBB     Past Surgical History: Past Surgical History:  Procedure Laterality Date  . APPENDECTOMY    . ORIF FEMUR FRACTURE  2001   left  . ORIF PERIPROSTHETIC FRACTURE Left 11/12/2013   Procedure: OPEN REDUCTION INTERNAL FIXATION (ORIF) LEFT HIP PERIPROSTHETIC FRACTURE,;  Surgeon: Marianna Payment, MD;  Location: [facility];  Service: Orthopedics;  Laterality: Left;  Social History: Social History  Substance Use Topics  . Smoking status: Never Smoker  . Smokeless tobacco: Never Used  . Alcohol use No   Additional social history: never smoker. Lives at home, has caregiver 24 hours Please also refer to relevant sections of EMR.  Family History: Family History  Problem Relation  Age of Onset  . Appendicitis Mother     Died age 78s-60s of ruptured appendix    Allergies and Medications: Allergies  Allergen Reactions  . Baclofen Other (See Comments)    Severe altered mental status, extreme sedation   . Levaquin [Levofloxacin] Anaphylaxis  . Penicillins Anaphylaxis    Has patient had a PCN reaction causing immediate rash, facial/tongue/throat swelling, SOB or lightheadedness with hypotension: Yes Has patient had a PCN reaction causing severe rash involving mucus membranes or skin necrosis: No Has patient had a PCN reaction that required hospitalization No Has patient had a PCN reaction occurring within the last 10 years: No If all of the above answers are "NO", then may proceed with Cephalosporin use.   . Sulfonamide Derivatives Anaphylaxis   No current facility-administered medications on file prior to encounter.    Current Outpatient Prescriptions on File Prior to Encounter  Medication Sig Dispense Refill  . amLODipine (NORVASC) 5 MG tablet TAKE 1 TABLET (5 MG TOTAL) BY MOUTH DAILY. 90 tablet 1  . aspirin 81 MG tablet Take 81 mg by mouth daily.    . Cholecalciferol (VITAMIN D PO) Take 5,000 Units by mouth daily.    . Cyanocobalamin (VITAMIN B-12 PO) Take 3,000 mg by mouth daily.    . furosemide (LASIX) 20 MG tablet TAKE 1/2 A TABLET BY MOUTH EVERY DAY 30 tablet 3  . levothyroxine (SYNTHROID, LEVOTHROID) 100 MCG tablet Take 1 tablet (100 mcg total) by mouth daily. (Patient taking differently: Take 100 mcg by mouth daily. BRAND NAME ONLY) 90 tablet 1  . NON FORMULARY Oxygen 2 Liters every night and as needed      Objective: BP (!) 107/51   Pulse (!) 52   Temp 98.7 F (37.1 C) (Oral)   Resp 26   Ht 5\' 6"  (1.676 m)   Wt 135 lb (61.2 kg)   SpO2 92%   BMI 21.79 kg/m  Exam: General: pleasant elderly lady, [place] in place, sitting up in bed in NAD Eyes: PERRLA, EOMI ENTM: moist mucous membranes, no erythema or exudate in posterior oropharynx, L TM obscured  by cerumen, R TM normal Neck: supple, non-tender, no lymphadenopathy or thyromegaly Cardiovascular: irregular rhythm, normal rate, clear S1 and S2. 4/6 holo-systolic murmur present. Respiratory: crackles in bilateral lung bases, no increased work of breathing, on 5L [place] Gastrointestinal: soft, non-tender, non-distended, +BS MSK: 5/5 strength in upper and lower extremities bilaterally, no edema or cyanosis, +2 dorsalis pedis pulse bilaterally Derm: skin warm, dry, no rashes noted Neuro: A&O x3 (though cannot specify month without cues, daughter reports this is baseline), no focal deficits, sensation in tact throughout Psych: normal mood appropriate affect  Labs and Imaging: CBC BMET   Recent Labs Lab 12/28/16 1018  WBC 5.3  HGB 9.0*  HCT 28.5*  PLT 177    Recent Labs Lab 12/28/16 1018  NA 140  K 4.7  CL 105  CO2 25  BUN 30*  CREATININE 1.81*  GLUCOSE 155*  CALCIUM 9.6     Dg Chest Portable 1 View  Result Date: 12/28/2016 CLINICAL DATA:  Decreased oxygen saturation. EXAM: PORTABLE CHEST 1 VIEW COMPARISON:  PA and lateral  chest 01/17/2016. FINDINGS: There is cardiomegaly and mild interstitial edema. No consolidative process, pneumothorax or effusion. Large hiatal hernia is noted. IMPRESSION: Cardiomegaly and mild interstitial edema. Large hiatal hernia. Electronically Signed   By: Inge Rise M.D.   On: 12/28/2016 11:07   EKG: afib, regular rate, RBBB, T wave inversions in lateral leads, unchanged from previous  BNP 573.8  Troponin 0.06  Lactic Acid 2.05  Steve Rattler, DO 12/28/2016, 4:23 PM PGY-1, [place] Intern pager: [phone], text pages welcome  Upper Level Addendum:  I have seen and evaluated this patient along with Dr. Vanetta Shawl and reviewed the above note, making necessary revisions in pink.  Gina Crews, MD, MPH PGY-3,  [facility] 12/28/2016 4:41 PM

## 2016-12-28 NOTE — Telephone Encounter (Signed)
Daughter wanted to let Dr. Ree Kida know blood count has dropped below 10. ep

## 2016-12-28 NOTE — ED Notes (Signed)
Lactic acid shown to dr.campos

## 2016-12-28 NOTE — Progress Notes (Signed)
Pt's trop=0.14, patient resting in bed, no c/o pain, family at bedside.  family medicine on call made aware.

## 2016-12-28 NOTE — ED Notes (Signed)
Lab to add-on BNP and Troponin.

## 2016-12-28 NOTE — Progress Notes (Signed)
Pt c/o shortness of breath, O2 sat 88% on 5L nasal cannula. Dr. Arrie Senate notified, ordered to give albuterol treatment  and hold IV fluid for now.

## 2016-12-28 NOTE — Progress Notes (Signed)
Pharmacy Antibiotic Note  YANELYS JOKI is a 83 y.o. female admitted on 12/28/2016 with sepsis.  Pharmacy has been consulted for aztrenam and vancomcyin dosing. Patient has allergy to several antibiotics noted.   Plan: Vancomycin 1000ng IV every 48 hours.  Goal trough 15-20 mcg/mL. Aztrenam 1000mg  IV q8 hours  Height: 5\' 6"  (167.6 cm) Weight: 135 lb (61.2 kg) IBW/kg (Calculated) : 59.3  Temp (24hrs), Avg:100.2 F (37.9 C), Min:100.2 F (37.9 C), Max:100.2 F (37.9 C)   Recent Labs Lab 12/28/16 1018 12/28/16 1030  WBC 5.3  --   CREATININE 1.81*  --   LATICACIDVEN  --  2.05*    Estimated Creatinine Clearance: 17.4 mL/min (by C-G formula based on SCr of 1.81 mg/dL (H)).    Allergies  Allergen Reactions  . Baclofen Other (See Comments)    Severe altered mental status, extreme sedation   . Levaquin [Levofloxacin] Anaphylaxis  . Penicillins Anaphylaxis    Has patient had a PCN reaction causing immediate rash, facial/tongue/throat swelling, SOB or lightheadedness with hypotension: Yes Has patient had a PCN reaction causing severe rash involving mucus membranes or skin necrosis: No Has patient had a PCN reaction that required hospitalization No Has patient had a PCN reaction occurring within the last 10 years: No If all of the above answers are "NO", then may proceed with Cephalosporin use.   . Sulfonamide Derivatives Anaphylaxis    Thank you for allowing pharmacy to be a part of this patient's care.  Jodean Lima Tymeer Vaquera 12/28/2016 1:08 PM

## 2016-12-29 ENCOUNTER — Inpatient Hospital Stay (HOSPITAL_COMMUNITY): Payer: Medicare Other

## 2016-12-29 DIAGNOSIS — I509 Heart failure, unspecified: Secondary | ICD-10-CM

## 2016-12-29 DIAGNOSIS — R0603 Acute respiratory distress: Secondary | ICD-10-CM

## 2016-12-29 LAB — ECHOCARDIOGRAM COMPLETE
AV Area VTI index: 0.25 cm2/m2
AV Area VTI: 0.42 cm2
AV Mean grad: 54 mmHg
AV Peak grad: 85 mmHg
AV area mean vel ind: 0.24 cm2/m2
AVA: 0.42 cm2
AVAREAMEANV: 0.41 cm2
AVCELMEANRAT: 0.23
AVPKVEL: 461 cm/s
Ao pk vel: 0.24 m/s
CHL CUP AV PEAK INDEX: 0.25
CHL CUP AV VEL: 0.42
CHL CUP MV M VEL: 99.3
DOP CAL AO MEAN VELOCITY: 347 cm/s
FS: 27 % — AB (ref 28–44)
Height: 66 in
IVS/LV PW RATIO, ED: 0.64
LA diam end sys: 43 mm
LA diam index: 2.51 cm/m2
LA vol A4C: 109 ml
LASIZE: 43 mm
LAVOL: 113 mL
LAVOLIN: 66 mL/m2
LDCA: 1.77 cm2
LV PW d: 12.8 mm — AB (ref 0.6–1.1)
LVOT MV VTI INDEX: 0.69 cm2/m2
LVOT MV VTI: 1.18
LVOT SV: 41 mL
LVOT VTI: 23.4 cm
LVOT diameter: 15 mm
LVOT peak VTI: 0.24 cm
LVOTPV: 110 cm/s
MV Annulus VTI: 35.1 cm
MVG: 5 mmHg
VTI: 98.8 cm
Valve area index: 0.25
Weight: 2212.8 oz

## 2016-12-29 LAB — RESPIRATORY PANEL BY PCR
Adenovirus: NOT DETECTED
Bordetella pertussis: NOT DETECTED
CORONAVIRUS OC43-RVPPCR: NOT DETECTED
Chlamydophila pneumoniae: NOT DETECTED
Coronavirus 229E: NOT DETECTED
Coronavirus HKU1: NOT DETECTED
Coronavirus NL63: NOT DETECTED
INFLUENZA B-RVPPCR: NOT DETECTED
Influenza A: NOT DETECTED
Metapneumovirus: NOT DETECTED
Mycoplasma pneumoniae: NOT DETECTED
PARAINFLUENZA VIRUS 1-RVPPCR: NOT DETECTED
Parainfluenza Virus 2: NOT DETECTED
Parainfluenza Virus 3: NOT DETECTED
Parainfluenza Virus 4: NOT DETECTED
RESPIRATORY SYNCYTIAL VIRUS-RVPPCR: NOT DETECTED
Rhinovirus / Enterovirus: NOT DETECTED

## 2016-12-29 LAB — BASIC METABOLIC PANEL
Anion gap: 6 (ref 5–15)
BUN: 26 mg/dL — AB (ref 6–20)
CHLORIDE: 109 mmol/L (ref 101–111)
CO2: 23 mmol/L (ref 22–32)
Calcium: 8.9 mg/dL (ref 8.9–10.3)
Creatinine, Ser: 1.63 mg/dL — ABNORMAL HIGH (ref 0.44–1.00)
GFR calc non Af Amer: 26 mL/min — ABNORMAL LOW (ref >60)
GFR, EST AFRICAN AMERICAN: 30 mL/min — AB (ref >60)
Glucose, Bld: 114 mg/dL — ABNORMAL HIGH (ref 65–99)
POTASSIUM: 4.6 mmol/L (ref 3.5–5.1)
SODIUM: 138 mmol/L (ref 135–145)

## 2016-12-29 LAB — BLOOD GAS, ARTERIAL
Acid-base deficit: 0.8 mmol/L (ref 0.0–2.0)
Bicarbonate: 23.2 mmol/L (ref 20.0–28.0)
DRAWN BY: 105521
Delivery systems: POSITIVE
EXPIRATORY PAP: 5
FIO2: 60
Inspiratory PAP: 15
O2 SAT: 98.5 %
PCO2 ART: 37.6 mmHg (ref 32.0–48.0)
PH ART: 7.407 (ref 7.350–7.450)
PO2 ART: 104 mmHg (ref 83.0–108.0)
Patient temperature: 98.6

## 2016-12-29 LAB — LACTIC ACID, PLASMA
Lactic Acid, Venous: 1.6 mmol/L (ref 0.5–1.9)
Lactic Acid, Venous: 1.6 mmol/L (ref 0.5–1.9)
Lactic Acid, Venous: 1.1 mmol/L (ref 0.5–1.9)

## 2016-12-29 LAB — CBC
HEMATOCRIT: 25.4 % — AB (ref 36.0–46.0)
Hemoglobin: 8 g/dL — ABNORMAL LOW (ref 12.0–15.0)
MCH: 30 pg (ref 26.0–34.0)
MCHC: 31.5 g/dL (ref 30.0–36.0)
MCV: 95.1 fL (ref 78.0–100.0)
Platelets: 154 10*3/uL (ref 150–400)
RBC: 2.67 MIL/uL — AB (ref 3.87–5.11)
RDW: 17.2 % — ABNORMAL HIGH (ref 11.5–15.5)
WBC: 6.2 10*3/uL (ref 4.0–10.5)

## 2016-12-29 LAB — TROPONIN I
TROPONIN I: 0.16 ng/mL — AB (ref <0.03)
TROPONIN I: 0.18 ng/mL — AB (ref <0.03)
Troponin I: 0.15 ng/mL (ref <0.03)
Troponin I: 0.16 ng/mL (ref <0.03)

## 2016-12-29 MED ORDER — WHITE PETROLATUM GEL
Status: AC
  Administered 2016-12-29: 14:00:00
  Filled 2016-12-29: qty 1

## 2016-12-29 MED ORDER — FUROSEMIDE 10 MG/ML IJ SOLN
40.0000 mg | Freq: Once | INTRAMUSCULAR | Status: AC
  Administered 2016-12-29: 40 mg via INTRAVENOUS
  Filled 2016-12-29: qty 4

## 2016-12-29 NOTE — Progress Notes (Signed)
  Spoke with CCM regarding patient's increased dyspnea. Due to patient's DNI status, they recommended using Bipap intermittently to help with pulmonary edema/pneumonia. Continue treating with diuretics and antibiotics. Nothing else to add.  Spoke with Dr. Aundra Dubin about patient's increasing troponin. He thinks likely demand ischemia. No need for heparin drip. No other intervention required. Recommended to stop trending troponin.  Lucila Maine, DO PGY-1, [facility] 12/29/2016 7:03 PM

## 2016-12-29 NOTE — Progress Notes (Signed)
Pt sats 89-91% on 15L HFNC, pt short of breath with exertion. Dr. Lavon Paganini at bedside. Plan to transfer pt  to SDU. Day RN made aware.

## 2016-12-29 NOTE — Progress Notes (Signed)
  Echocardiogram 2D Echocardiogram has been performed.  Gina Wilkinson 12/29/2016, 4:19 PM

## 2016-12-29 NOTE — Significant Event (Signed)
Rapid Response Event Note  Overview:  Called by [facility] for patient needing SDU bed Time Called: 0805 Arrival Time: 0805 Event Type: Respiratory  Initial Focused Assessment: On arrival patient supine in bed - pale - alert oriented x 4 - hot to touch - speech clear - can speak full sentences - moderate resp distress with increased WOB - few accessory muscle use - resps rapid 36-40 - denies SOB or pain.  On NRB mask - O2 sats fluctuating between 85-98 % - has very little reserve - desaturation with least bit of activity.  Bil BS present - coarse - crackles noted bil in bases posterioly - not coughing up any mucus - will cough to command - strong.  Monitor shows afib - chronic for patient.  No pedal edema noted.  BP 142/53.     Interventions:  Repositioned in bed - rectal temp done - 101.6 - on/off bedpan - clear dark urine - continues to desat with activity- Dr. Vanetta Shawl to bedside.  Plan of care discussed.  Bipap to bedside.  Caryl Pina RN at bedside.  Lasix  4o mg IV given per MD order.  Placed Foley cath - clear yellow urine.  Continues with desaturations and increased WOB - placed on Bipap 15/5 60% FiO2 per RT [place] - tol well.  After 15 minutes patient with decreased WOB - O2 sats maintaining at 95-95% RR 22 patient resting.  Diuresing well - 900 cc.  Remains comfortable on Bipap.  Transferred to 4E15 at 1047 with Bipap  tol well.  Handoff to [employer].    Plan of Care (if not transferred): - Event Summary: Name of Physician Notified: Dr. Laury Deep at  (pta RRT)    at    Outcome: Transferred (Comment) (4315  SDU)  Event End Time: [place]  Quin Hoop

## 2016-12-29 NOTE — Progress Notes (Signed)
Family Medicine Teaching Service Daily Progress Note Intern Pager: [phone]  Patient name: Gina Wilkinson Medical record number: [id number] Date of birth: [birth year] Age: 83 y.o. Gender: female  Primary Care Provider: Lupita Dawn, MD Consultants: none Code Status: DNR/DNI  Pt Overview and Major Events to Date:  2/2- admitted to [facility] for dyspnea 2/3- transferred to SDU  Assessment and Plan: Gina Wilkinson is a 83 y.o. female presenting with fever and weakness. PMH is significant for Myleodysplastic syndrome, hypothyroidism, diastolic HF, RBBB, aortic stenosis, HTN, anemia, atrial fibrillation, CKD stage 4, chronic respiratory failure  Dyspnea, acute on chronic hypoxic respiratory failure- patient with chronic respiratory failure on 2L O2 at home during sleep and with activity. Required non-rebreather in EMS. Saturations in low 80's on arrival to ED. Responded well to supplemental oxygen and breathing treatment. Saturating 92% on 5L O2 in ED. CXR negative for infiltrate, some evidence of interstitial edema. Mild crackles auscultated in bilateral lung bases. BNP elevated to 573. Troponin elevated to 0.06 likely demand ischemia, has been chronically elevated in past. EKG in atrial fibrillation, RBBB present. Differential for dyspnea include infectious process, worsening of respiratory fucntion, due to pulmonary hypertension or worsening valvular dysfunction, or  ACS.  -patient currently requiring non-rebreather -transferring to step down this morning -critical care nurse at bedside, will place bipap if needed -vitals per unit -up with assistance -am EKG -continue to trend troponin -respiratory viral panel ordered, pending -droplet precautions -treatment of potential infectious process as below -repeat Echo  Subjective fever and weakness- repeat CXR morning of 2/3 with evidence of multifocal pneumonia, Patient febrile to 101.6 per rectum this morning. Contact with flu positive  caregiver earlier this week. Flu swab negative but possible false negative. Lactic acid trended up to 6 on day of admission, improved with 3 L fluid bolus, however in setting of CHF patients dyspnea has worsened after fluids. -urine culture reincubated for better growth -blood cultures no growth < 24 hours -continue aztreonam and vancomycin per pharmacy -monitor fever curve  -tylenol as needed for fever -treat with Tamiflu for influenza-like illness, 30mg  BID given low CrCl -repeat Lactic acid this morning as patient is febrile -respiratory viral panel pending  Afib- Stable. Currently in afib, rate in 60's. -continue home aspirin 81 mg -monitor on telemetry  Hypothyroidism- takes synthroid daily. Last TSH from November 2017 0.13.  -continue home synthroid  CHF- BNP on admission 573, similar to BNP from 12/2015 of 548, not clinically fluid overloaded. Takes lasix daily, did take dose today. Last Echo February 2017 with EF 65-70%, severe aortic stenosis, pulmonary hypertension.  -one dose 40mg  IV lasix given this morning due to dyspnea -repeat Echo -daily weights, Is&Os -gentle fluids as needed  HTN- on norvasc 5 mg daily at home. BP 123/45 today -hold home BP meds in setting of soft BP's  MDS (Myelodysplastic Syndrome)- unclear if this active issue. Has anemia. Surgical path report from 2010 without clear MDS diagnosis. WBC normal.  -monitor daily CBC - likely would benefit from f/u OP with Onc  Stage 4 CKD- creatinine baseline 1.5-1.9. Creatinine 1.81 in ED.  -gentle fluids as needed -avoid nephrotoxic agents -monitor daily  Normocytic Anemia- baseline appears to be 9-10. Hemoglobin 9 in ED. -monitor with daily CBC  FEN/GI: regular diet PPx: heparin  Disposition: transfer to step-down  Subjective:  Gina Wilkinson is having a hard time breathing this morning, she is feverish. She is speaking easily and denies pain. She is hungry but did not eat  dinner because she didn't  like the food. Daughter at bedside.  Objective: Temp:  [98 F (36.7 C)-101.6 F (38.7 C)] 101.6 F (38.7 C) (02/03 0829) Pulse Rate:  [43-103] 74 (02/03 0511) Resp:  [20-31] 20 (02/03 0511) BP: (87-126)/(43-69) 123/45 (02/03 0511) SpO2:  [71 %-99 %] 91 % (02/03 0701) Weight:  [135 lb (61.2 kg)-141 lb 14.4 oz (64.4 kg)] 138 lb 4.8 oz (62.7 kg) (02/03 0511) Physical Exam: General: elderly lady laying in bed, non-rebreather mask in place, in mild distress Cardiovascular: irregular rhythm, regular rate, 3/6 systolic murmur Respiratory: increased work of breathing, tachypnic, lungs with crackles in bases bilaterally Abdomen: soft, nontender, nondistended, +BS Extremities: warm, well perfused, no edema or cyanosis  Laboratory:  Recent Labs Lab 12/28/16 1018  WBC 5.3  HGB 9.0*  HCT 28.5*  PLT 177    Recent Labs Lab 12/28/16 1018  NA 140  K 4.7  CL 105  CO2 25  BUN 30*  CREATININE 1.81*  CALCIUM 9.6  PROT 7.1  BILITOT 0.8  ALKPHOS 55  ALT 10*  AST 18  GLUCOSE 155*   Lactic Acid 2.05 > 6.08 > 4.8 > 3.7 > 1.6 Troponin: 0.06 > 0.14 > 0.15  Imaging/Diagnostic Tests: Dg Chest Port 1 View  Result Date: 12/29/2016 CLINICAL DATA:  Acute onset of respiratory distress. Initial encounter. EXAM: PORTABLE CHEST 1 VIEW COMPARISON:  Chest radiograph performed 12/28/2016 FINDINGS: The lungs are well-aerated. Right midlung and left basilar airspace opacities raise concern for multifocal pneumonia. There is no evidence of pleural effusion or pneumothorax. The cardiomediastinal silhouette is enlarged. No acute osseous abnormalities are seen. A large hiatal hernia is again noted. IMPRESSION: 1. Right midlung and left basilar airspace opacities raise concern for multifocal pneumonia. 2. Cardiomegaly. 3. Large hiatal hernia again noted. Electronically Signed   By: Garald Balding M.D.   On: 12/29/2016 01:53   Dg Chest Portable 1 View  Result Date: 12/28/2016 CLINICAL DATA:  Decreased oxygen  saturation. EXAM: PORTABLE CHEST 1 VIEW COMPARISON:  PA and lateral chest 01/17/2016. FINDINGS: There is cardiomegaly and mild interstitial edema. No consolidative process, pneumothorax or effusion. Large hiatal hernia is noted. IMPRESSION: Cardiomegaly and mild interstitial edema. Large hiatal hernia. Electronically Signed   By: Inge Rise M.D.   On: 12/28/2016 11:07     Steve Rattler, DO 12/29/2016, 9:18 AM PGY-1, [place] Intern pager: [phone], text pages welcome

## 2016-12-29 NOTE — Progress Notes (Signed)
   12/29/16 0021  Vitals  Temp 99.1 F (37.3 C)  Temp Source Oral  BP (!) 124/43  BP Location Right Arm  BP Method Automatic  Patient Position (if appropriate) Lying  Pulse Rate 91  Pulse Rate Source Dinamap  Resp (!) 22  Oxygen Therapy  SpO2 (!) 85 %  O2 Device Nasal Cannula  O2 Flow Rate (L/min) 5 L/min  Pt c/o shortness of breath, O2 sat 85% on 5L [place], switched  to venturi mask pt not able to tolerate, pt placed on non rebreather  O2 sat up to 95%. RT at bedside, rapid response nurse at bedside, family medicine on call notified. Portable chest xray ordered. Will continue to monitor.

## 2016-12-29 NOTE — Progress Notes (Signed)
Pt now resting in bed asleep, sats 95% on HFNC, placed on continuous pulse ox monitor.

## 2016-12-29 NOTE — Progress Notes (Signed)
Patient transported to [id number] without complications, on bipap.

## 2016-12-29 NOTE — Progress Notes (Signed)
Pt not comfortable wearing NRB, keeps pulling at mask. Changed pt over to 15L HFNC for comfort. Pt tolerating at this time. RT will continue to monitor.

## 2016-12-29 NOTE — Progress Notes (Signed)
Patient desating to 82% on HFNC, Non re breather placed. Current O2 sat is 92%.  Respiratory and RT currently at bedside. RN will continue to monitor. Janne Lab, RN

## 2016-12-29 NOTE — Significant Event (Signed)
Rapid Response Event Note  Overview:Called by bedside RN re pt sats 85% on NRB Time Called: 0057 Arrival Time: 0105 Event Type: Respiratory  Initial Focused Assessment: Pt alert and oriented, tachypneic-30s, lungs diminished t/o, increased WOB, sats 97% on PRB mask.   T-99.1, BP-124/43, HR-69 Afib, RR-30s. Denies chest pain.   Interventions: Pt received breathing treatment PTA RRT. Respiratory to place patient on HFNC. PCXR ordered-? pna.  Plan of Care (if not transferred): HFNC, monitor pt sats continuously.  Called RRT with concerns. RN to notify MD of events. Event Summary:   at      at    Outcome: Stayed in room and stabalized  Event End Time: 0120  Dillard [place]

## 2016-12-29 NOTE — Progress Notes (Signed)
Placed pt on 5L [place].  Sats 90-91

## 2016-12-29 NOTE — Progress Notes (Signed)
Patient transferred to 4E15.  NAD.  On bipap.  REport given to [place], Therapist, sports.

## 2016-12-30 ENCOUNTER — Inpatient Hospital Stay (HOSPITAL_COMMUNITY): Payer: Medicare Other

## 2016-12-30 ENCOUNTER — Encounter (HOSPITAL_COMMUNITY): Payer: Self-pay | Admitting: Cardiology

## 2016-12-30 DIAGNOSIS — I10 Essential (primary) hypertension: Secondary | ICD-10-CM

## 2016-12-30 DIAGNOSIS — I350 Nonrheumatic aortic (valve) stenosis: Secondary | ICD-10-CM

## 2016-12-30 DIAGNOSIS — R0600 Dyspnea, unspecified: Secondary | ICD-10-CM

## 2016-12-30 DIAGNOSIS — I482 Chronic atrial fibrillation: Secondary | ICD-10-CM

## 2016-12-30 LAB — BASIC METABOLIC PANEL
ANION GAP: 7 (ref 5–15)
BUN: 30 mg/dL — ABNORMAL HIGH (ref 6–20)
CALCIUM: 8.8 mg/dL — AB (ref 8.9–10.3)
CO2: 25 mmol/L (ref 22–32)
Chloride: 105 mmol/L (ref 101–111)
Creatinine, Ser: 1.61 mg/dL — ABNORMAL HIGH (ref 0.44–1.00)
GFR, EST AFRICAN AMERICAN: 30 mL/min — AB (ref >60)
GFR, EST NON AFRICAN AMERICAN: 26 mL/min — AB (ref >60)
GLUCOSE: 94 mg/dL (ref 65–99)
Potassium: 4.4 mmol/L (ref 3.5–5.1)
Sodium: 137 mmol/L (ref 135–145)

## 2016-12-30 LAB — CBC
HCT: 25.2 % — ABNORMAL LOW (ref 36.0–46.0)
HEMOGLOBIN: 7.9 g/dL — AB (ref 12.0–15.0)
MCH: 29.5 pg (ref 26.0–34.0)
MCHC: 31.3 g/dL (ref 30.0–36.0)
MCV: 94 fL (ref 78.0–100.0)
PLATELETS: 145 10*3/uL — AB (ref 150–400)
RBC: 2.68 MIL/uL — AB (ref 3.87–5.11)
RDW: 16.7 % — ABNORMAL HIGH (ref 11.5–15.5)
WBC: 4.7 10*3/uL (ref 4.0–10.5)

## 2016-12-30 LAB — URINE CULTURE

## 2016-12-30 MED ORDER — OLOPATADINE HCL 0.1 % OP SOLN
1.0000 [drp] | Freq: Two times a day (BID) | OPHTHALMIC | Status: DC | PRN
  Administered 2016-12-31: 1 [drp] via OPHTHALMIC
  Filled 2016-12-30 (×2): qty 5

## 2016-12-30 NOTE — Consult Note (Signed)
CARDIOLOGY CONSULT NOTE  Patient ID: Gina Wilkinson MRN: [id number] DOB/AGE: 1920-09-20 83 y.o.  Admit date: 12/28/2016 Primary Physician Lupita Dawn, MD Primary Cardiologist Dr. Lovena Le Chief Complaint  Dyspnea.   Requesting     HPI:  The patient has a history of AS (severe on echo on 2/17) and known atrial fib.  The patient presented with weakness, fever and acute dyspnea.  She is being managed for possible sepsis and possible flu.      She was also mildly hypotensive on admission and her creat was up slightly.  She was treated with 3 liters of fluid after presentation. She required BiPAP for acute worsening dyspnea and was diuresed.    She was noted to have elevated troponin.   Echo this admission demonstrated a preserved EF.  She had severe AS.  She has a heavily calcified aortic valve and there was mention that a vegetation could not be excluded.  We were called to perform a TEE.    The patient says that she feels well today.  She does not report SOB.  She is afebrile.   Blood cultures are thus far negative x 2 days.  She gets around in her house with a cane.  The patient denies any new symptoms such as chest discomfort, neck or arm discomfort. There has been no new  PND or orthopnea. There have been no reported palpitations, presyncope or syncope.  Past Medical History:  Diagnosis Date  . Acute respiratory failure with hypoxia ([place])   . Anemia, unspecified 07/27/2014  . Aortic stenosis   . Chronic diastolic CHF (congestive heart failure) ([place])   . Chronic respiratory failure (H[facility])    a. on home O2 qhs and PRN.  . CKD (chronic kidney disease), stage IV ([place])    a. per review of historical labs.  . Hypertension   . Hypothyroidism   . MDS (myelodysplastic syndrome) ([place]) 10/05/2011  . Mild cognitive impairment   . Moderate to severe pulmonary hypertension   . Permanent atrial fibrillation ([place]) dx Mar. 2010   a. Not on anticoag due to myelodysplastic syndrome.  . Pneumonia   .  Pulmonary hypertension   . RBBB     Past Surgical History:  Procedure Laterality Date  . APPENDECTOMY    . ORIF FEMUR FRACTURE  2001   left  . ORIF PERIPROSTHETIC FRACTURE Left 11/12/2013   Procedure: OPEN REDUCTION INTERNAL FIXATION (ORIF) LEFT HIP PERIPROSTHETIC FRACTURE,;  Surgeon: Marianna Payment, MD;  Location: [facility];  Service: Orthopedics;  Laterality: Left;    Allergies  Allergen Reactions  . Baclofen Other (See Comments)    Severe altered mental status, extreme sedation   . Levaquin [Levofloxacin] Anaphylaxis  . Penicillins Anaphylaxis    Has patient had a PCN reaction causing immediate rash, facial/tongue/throat swelling, SOB or lightheadedness with hypotension: Yes Has patient had a PCN reaction causing severe rash involving mucus membranes or skin necrosis: No Has patient had a PCN reaction that required hospitalization No Has patient had a PCN reaction occurring within the last 10 years: No If all of the above answers are "NO", then may proceed with Cephalosporin use.   . Sulfonamide Derivatives Anaphylaxis   Prescriptions Prior to Admission  Medication Sig Dispense Refill Last Dose  . amLODipine (NORVASC) 5 MG tablet TAKE 1 TABLET (5 MG TOTAL) BY MOUTH DAILY. 90 tablet 1 12/27/2016 at Unknown time  . aspirin 81 MG tablet Take 81 mg by mouth daily.   12/27/2016 at  Unknown time  . Cholecalciferol (VITAMIN D PO) Take 5,000 Units by mouth daily.   12/27/2016 at Unknown time  . Cyanocobalamin (VITAMIN B-12 PO) Take 3,000 mg by mouth daily.   12/27/2016 at Unknown time  . furosemide (LASIX) 20 MG tablet TAKE 1/2 A TABLET BY MOUTH EVERY DAY 30 tablet 3 12/28/2016 at Unknown time  . levothyroxine (SYNTHROID, LEVOTHROID) 100 MCG tablet Take 1 tablet (100 mcg total) by mouth daily. (Patient taking differently: Take 100 mcg by mouth daily. BRAND NAME ONLY) 90 tablet 1 12/28/2016 at Unknown time  . NON FORMULARY Oxygen 2 Liters every night and as needed   12/27/2016 at Unknown time   Family  History  Problem Relation Age of Onset  . Appendicitis Mother     Died age 31s-60s of ruptured appendix    Social History   Social History  . Marital status: Widowed    Spouse name: N/A  . Number of children: N/A  . Years of education: N/A   Occupational History  . Not on file.   Social History Main Topics  . Smoking status: Never Smoker  . Smokeless tobacco: Never Used  . Alcohol use No  . Drug use: No  . Sexual activity: Not on file   Other Topics Concern  . Not on file   Social History Narrative  . No narrative on file     ROS:    As stated in the HPI and negative for all other systems.  Physical Exam: Blood pressure (!) 116/53, pulse 90, temperature 97.9 F (36.6 C), temperature source Oral, resp. rate 17, height 5\' 6"  (1.676 m), weight 139 lb 1.8 oz (63.1 kg), SpO2 98 %.  GENERAL:  Well appearing for her age  77:  Pupils equal round and reactive, fundi not visualized, oral mucosa unremarkable NECK:  No jugular venous distention, waveform within normal limits, carotid upstroke brisk and symmetric, no bruits, no thyromegaly LYMPHATICS:  No cervical, inguinal adenopathy LUNGS:  Bilateral crackles.  BACK:  No CVA tenderness CHEST:  Unremarkable HEART:  PMI not displaced or sustained,S1 and S2 within normal limits, no S3, no S4, no clicks, no rubs, 2/6 apical systolic murmur radiating out the outflow tract.  No diastolic murmursABD:  Flat, positive bowel sounds normal in frequency in pitch, no bruits, no rebound, no guarding, no midline pulsatile mass, no hepatomegaly, no splenomegaly EXT:  2 plus pulses throughout, no edema, no cyanosis no clubbing SKIN:  No rashes no nodules, multiple bruises NEURO:  Cranial nerves II through XII grossly intact, motor grossly intact throughout PSYCH:  Cognitively intact, oriented to person place and time  Labs: Lab Results  Component Value Date   BUN 30 (H) 12/30/2016   Lab Results  Component Value Date   CREATININE 1.61  (H) 12/30/2016   Lab Results  Component Value Date   NA 137 12/30/2016   K 4.4 12/30/2016   CL 105 12/30/2016   CO2 25 12/30/2016   Lab Results  Component Value Date   TROPONINI 0.18 ([place]) 12/29/2016   Lab Results  Component Value Date   WBC 4.7 12/30/2016   HGB 7.9 (L) 12/30/2016   HCT 25.2 (L) 12/30/2016   MCV 94.0 12/30/2016   PLT 145 (L) 12/30/2016   Lab Results  Component Value Date   CHOL  01/28/2009    90        ATP III CLASSIFICATION:  <200     mg/dL   Desirable  200-239  mg/dL  Borderline High  >=240    mg/dL   High          HDL 53 01/28/2009   LDLCALC  01/28/2009    31        Total Cholesterol/HDL:CHD Risk Coronary Heart Disease Risk Table                     Men   Women  1/2 Average Risk   3.4   3.3  Average Risk       5.0   4.4  2 X Average Risk   9.6   7.1  3 X Average Risk  23.4   11.0        Use the calculated Patient Ratio above and the CHD Risk Table to determine the patient's CHD Risk.        ATP III CLASSIFICATION (LDL):  <100     mg/dL   Optimal  100-129  mg/dL   Near or Above                    Optimal  130-159  mg/dL   Borderline  160-189  mg/dL   High  >190     mg/dL   Very High   TRIG 29 01/28/2009   CHOLHDL 1.7 01/28/2009   Lab Results  Component Value Date   ALT 10 (L) 12/28/2016   AST 18 12/28/2016   ALKPHOS 55 12/28/2016   BILITOT 0.8 12/28/2016    Radiology:   CXR: IMPRESSION: 1. Right midlung and left basilar airspace opacities raise concern for multifocal pneumonia. 2. Cardiomegaly. 3. Large hiatal hernia again noted.  EKG:   Atrial fib with RBBB RAD  Rate 71  ASSESSMENT AND PLAN:   AORTIC STENOSIS:   No clinical suggestion of endocarditis.  No indication for TEE.  The patient would refuse a TEE.  No change in therapy.    ATRIAL FIB:   Stable chronic atrial fib.  Longstanding agreement between her and Dr. Lovena Le that she will not be on anticoagulation.    HTN:     Low BP is improved.    Please call with  further questions.   SignedMinus Breeding 12/30/2016, 1:19 PM

## 2016-12-30 NOTE — Progress Notes (Signed)
Family Medicine Teaching Service Daily Progress Note Intern Pager: [phone]  Patient name: Gina Wilkinson Medical record number: [id number] Date of birth: November 19, 1920 Age: 83 y.o. Gender: female  Primary Care Provider: Lupita Dawn, MD Consultants: none Code Status: DNR/DNI  Pt Overview and Major Events to Date:  2/2- admitted to [facility] for dyspnea 2/3- transferred to SDU  Assessment and Plan: 83 y.o. female presenting with fever and weakness. PMH is significant for Myleodysplastic syndrome, hypothyroidism, diastolic HF, RBBB, aortic stenosis, HTN, anemia, atrial fibrillation, CKD stage 4, chronic respiratory failure  Dyspnea, acute on chronic hypoxic respiratory failure-  RSV panel negative. BNP 573.8. Echocardiogram with critical AS. Suspect secondary to Fluid Overload with 3L bolus given yesterday, but could also be symptomatic presentation of AS. - Monitor respiratory status. Required BiPAP for short period yesterday, now on High Flow Nasal Cannula at 6L  - Currently in SDU. Transfer back to medical floor when able. - Droplet Precautions - Vitals per unit - Up with assistance - Repeat CXR  - Discussed increasing Troponins (0.14>0.18) with Cardiology. Most likely demand ischemia. Recommend discontinuing trend. - Lasix 40mg  IV given yesterday. Consider additional doses as needed. - Cardiology consulted for Echo results, will discuss critical AS as well - PT and OT consulted  Subjective fever and weakness- repeat CXR morning of 2/3 with evidence of multifocal pneumonia. Contact with flu positive caregiver earlier this week. Flu swab negative but possible false negative. Lactic acid 6>1.1 following fluids. Echocardiogram with critical AS, heavy valve calcification (can't rule out vegetation and endocarditis). - Monitor fever curve  - Urine culture reincubated for better growth - Blood cultures no growth  - Repeat CXR - Continue aztreonam and vancomycin per pharmacy - Tylenol as  needed for fever - Treat with Tamiflu for influenza-like illness, 30mg  BID given low CrCl - Consult Cardiology for possible TEE  MDS (Myelodysplastic Syndrome)- unclear if this active issue. Has anemia. Surgical path report from 2010 without clear MDS diagnosis. WBC normal. Hemoglobin 8>7.9. Platelets 145.  - Monitor daily CBC. If hemoglobin continues to drop consider transfusion, however cautious given fluid overload earlier in hospitalization - Likely would benefit from f/u OP with Onc  FEN/GI: regular diet PPx: heparin  Disposition: SDU, transfer to floor when appropriate (anticipate 2/5)  Subjective:  Reports she is feeling much better today. Breathing is more comfortable and she is more alert. Denies pain. Very thankful for care provided during hospitalization.  Objective: Temp:  [97.7 F (36.5 C)-101.6 F (38.7 C)] 97.7 F (36.5 C) (02/04 0427) Pulse Rate:  [63-87] 82 (02/04 0427) Resp:  [19-31] 19 (02/04 0427) BP: (110-142)/(53-93) 121/64 (02/04 0427) SpO2:  [90 %-100 %] 100 % (02/04 0427) FiO2 (%):  [55 %-60 %] 55 % (02/03 1243) Weight:  [139 lb 1.8 oz (63.1 kg)] 139 lb 1.8 oz (63.1 kg) (02/04 0427) Physical Exam: General: elderly lady laying in bed, high flow nasal cannula in place, no apparent distress Cardiovascular: irregular rhythm, regular rate, 3/6 systolic murmur Respiratory: clear to auscultation bilaterally, no wheezing Abdomen: soft, nontender, nondistended, +BS Extremities: warm, well perfused, no edema or cyanosis  Laboratory:  Recent Labs Lab 12/28/16 1018 12/29/16 0918 12/30/16 0341  WBC 5.3 6.2 4.7  HGB 9.0* 8.0* 7.9*  HCT 28.5* 25.4* 25.2*  PLT 177 154 145*    Recent Labs Lab 12/28/16 1018 12/29/16 0918 12/30/16 0341  NA 140 138 137  K 4.7 4.6 4.4  CL 105 109 105  CO2 25 23 25   BUN 30*  26* 30*  CREATININE 1.81* 1.63* 1.61*  CALCIUM 9.6 8.9 8.8*  PROT 7.1  --   --   BILITOT 0.8  --   --   ALKPHOS 55  --   --   ALT 10*  --   --    AST 18  --   --   GLUCOSE 155* 114* 94   Lactic Acid 2.05 > 6.08 > 4.8 > 3.7 > 1.6 Troponin: 0.06 > 0.14 > 0.15  Imaging/Diagnostic Tests: Dg Chest Port 1 View  Result Date: 12/29/2016 CLINICAL DATA:  Acute onset of respiratory distress. Initial encounter. EXAM: PORTABLE CHEST 1 VIEW COMPARISON:  Chest radiograph performed 12/28/2016 FINDINGS: The lungs are well-aerated. Right midlung and left basilar airspace opacities raise concern for multifocal pneumonia. There is no evidence of pleural effusion or pneumothorax. The cardiomediastinal silhouette is enlarged. No acute osseous abnormalities are seen. A large hiatal hernia is again noted. IMPRESSION: 1. Right midlung and left basilar airspace opacities raise concern for multifocal pneumonia. 2. Cardiomegaly. 3. Large hiatal hernia again noted. Electronically Signed   By: Garald Balding M.D.   On: 12/29/2016 01:53   Dg Chest Portable 1 View  Result Date: 12/28/2016 CLINICAL DATA:  Decreased oxygen saturation. EXAM: PORTABLE CHEST 1 VIEW COMPARISON:  PA and lateral chest 01/17/2016. FINDINGS: There is cardiomegaly and mild interstitial edema. No consolidative process, pneumothorax or effusion. Large hiatal hernia is noted. IMPRESSION: Cardiomegaly and mild interstitial edema. Large hiatal hernia. Electronically Signed   By: Inge Rise M.D.   On: 12/28/2016 11:07   Lorna Few, DO 12/30/2016, 7:18 AM PGY-3, [facility] Intern pager: [phone], text pages welcome

## 2016-12-31 DIAGNOSIS — J1100 Influenza due to unidentified influenza virus with unspecified type of pneumonia: Secondary | ICD-10-CM

## 2016-12-31 LAB — CBC
HCT: 23.5 % — ABNORMAL LOW (ref 36.0–46.0)
Hemoglobin: 7.5 g/dL — ABNORMAL LOW (ref 12.0–15.0)
MCH: 29.6 pg (ref 26.0–34.0)
MCHC: 31.9 g/dL (ref 30.0–36.0)
MCV: 92.9 fL (ref 78.0–100.0)
PLATELETS: 148 10*3/uL — AB (ref 150–400)
RBC: 2.53 MIL/uL — AB (ref 3.87–5.11)
RDW: 16.2 % — AB (ref 11.5–15.5)
WBC: 4.5 10*3/uL (ref 4.0–10.5)

## 2016-12-31 LAB — BASIC METABOLIC PANEL
Anion gap: 6 (ref 5–15)
BUN: 30 mg/dL — AB (ref 6–20)
CALCIUM: 8.7 mg/dL — AB (ref 8.9–10.3)
CHLORIDE: 106 mmol/L (ref 101–111)
CO2: 26 mmol/L (ref 22–32)
CREATININE: 1.4 mg/dL — AB (ref 0.44–1.00)
GFR calc Af Amer: 36 mL/min — ABNORMAL LOW (ref >60)
GFR calc non Af Amer: 31 mL/min — ABNORMAL LOW (ref >60)
GLUCOSE: 94 mg/dL (ref 65–99)
Potassium: 4.2 mmol/L (ref 3.5–5.1)
Sodium: 138 mmol/L (ref 135–145)

## 2016-12-31 MED ORDER — DOXYCYCLINE HYCLATE 100 MG PO TABS
100.0000 mg | ORAL_TABLET | Freq: Two times a day (BID) | ORAL | Status: AC
  Administered 2016-12-31 – 2017-01-01 (×4): 100 mg via ORAL
  Filled 2016-12-31 (×4): qty 1

## 2016-12-31 MED ORDER — AZITHROMYCIN 500 MG PO TABS
500.0000 mg | ORAL_TABLET | Freq: Every day | ORAL | Status: DC
  Administered 2016-12-31: 500 mg via ORAL
  Filled 2016-12-31: qty 1

## 2016-12-31 MED ORDER — FUROSEMIDE 20 MG PO TABS
10.0000 mg | ORAL_TABLET | Freq: Every day | ORAL | Status: DC
  Administered 2016-12-31 – 2017-01-02 (×3): 10 mg via ORAL
  Filled 2016-12-31 (×2): qty 0.5
  Filled 2016-12-31 (×2): qty 1

## 2016-12-31 NOTE — Care Management Note (Addendum)
Case Management Note  Patient Details  Name: KORISSA SWEETIN MRN: [id number] Date of Birth: [birth year]  Subjective/Objective:   Presents with Sepsis, lives alone and has 24 hrs care with [facility], she has has home oxygen with [facility] ( 2 liters continous), she also has a rolling walker at home, NCM spoke with her daughter [name] in room with patient [phone], she states patient can ride in car at [employer], she has insurance with Medicare and supplement with [employer].  She has PCP Dr. Loletha Grayer with [facility].  She also has another daughter [name] [id number].  Patient has had [facility] before with [facility],if she needs [facility] services again,she would prefer [facility].  Per pt eval rec no pt follow up.                   Action/Plan:   Expected Discharge Date:                  Expected Discharge Plan:  [place]  In-House Referral:     Discharge planning Services  CM Consult  Post Acute Care Choice:    Choice offered to:     DME Arranged:    DME Agency:     HH Arranged:    [place] Agency:     Status of Service:  In process, will continue to follow  If discussed at Long Length of Stay Meetings, dates discussed:    Additional Comments:  Zenon Mayo, RN 12/31/2016, 12:23 PM

## 2016-12-31 NOTE — Progress Notes (Signed)
Family Medicine Teaching Service Daily Progress Note Intern Pager: [phone]  Patient name: Gina Wilkinson Medical record number: [id number] Date of birth: 02-25-1920 Age: 83 y.o. Gender: female  Primary Care Provider: Lupita Dawn, MD Consultants: none Code Status: DNR/DNI  Pt Overview and Major Events to Date:  2/2- admitted to [facility] for dyspnea 2/3- transferred to SDU  Assessment and Plan: 83 y.o. female presenting with fever and weakness. PMH is significant for Myleodysplastic syndrome, hypothyroidism, diastolic HF, RBBB, aortic stenosis, HTN, anemia, atrial fibrillation, CKD stage 4, chronic respiratory failure  Dyspnea, acute on chronic hypoxic respiratory failure-  RSV panel negative. BNP 573.8. Echocardiogram with critical AS. Suspect secondary to Fluid Overload with 3L bolus given yesterday, but could also be symptomatic presentation of AS. - Monitor respiratory status.Currently on [place] at 3L - Currently in SDU. Transfer back to medical floor when able, possibly today - Droplet Precautions - Vitals per unit - Up with assistance - Cardiology consulted for Echo results, no intervention recommended, no TEE - PT and OT consulted  Subjective fever and weakness- repeat CXR morning of 2/3 with evidence of multifocal pneumonia. Contact with flu positive caregiver earlier this week. Flu swab negative but possible false negative. Lactic acid 6>1.1 following fluids. Echocardiogram with critical AS, heavy valve calcification (can't rule out vegetation and endocarditis). - Monitor fever curve  - Urine culture >100k colonies of aerococcus urinae, G+ coccus, patient already on vancomycin - Blood cultures no growth x 3 days - Continue aztreonam and vancomycin per pharmacy, plan to de-escalate as able - Tylenol as needed for fever - Treat with Tamiflu for influenza-like illness, 30mg  BID given low CrCl  MDS (Myelodysplastic Syndrome)- unclear if this active issue. Has anemia. Surgical  path report from 2010 without clear MDS diagnosis. WBC normal. Hemoglobin 8>7.9>7.5. Platelets 145.  - Monitor daily CBC. If hemoglobin continues to drop consider transfusion, however cautious given fluid overload earlier in hospitalization - Likely would benefit from f/u OP with Onc  FEN/GI: regular diet PPx: heparin  Disposition: possible transfer out of SDU today  Subjective:  Gina Wilkinson is doing very well this morning, daughter in room, PT walked patient around room. Feels well denies any pain, no fevers, chills. Feels like she is breathing well.   Objective: Temp:  [97.8 F (36.6 C)-98.1 F (36.7 C)] 98.1 F (36.7 C) (02/05 0401) Pulse Rate:  [83-98] 84 (02/05 0401) Resp:  [17-27] 27 (02/05 0401) BP: (110-128)/(53-70) 110/54 (02/05 0401) SpO2:  [92 %-98 %] 96 % (02/05 0401) Weight:  [137 lb 2 oz (62.2 kg)] 137 lb 2 oz (62.2 kg) (02/05 0401) Physical Exam: General: elderly lady sitting up in bedside chair, [place] in place. NAD Cardiovascular: irregular rhythm, regular rate, 3/6 systolic murmur Respiratory: clear to auscultation bilaterally, no increased work of breathing Abdomen: soft, nontender, nondistended, +BS Extremities: warm, well perfused, no edema or cyanosis  Laboratory:  Recent Labs Lab 12/29/16 0918 12/30/16 0341 12/31/16 0151  WBC 6.2 4.7 4.5  HGB 8.0* 7.9* 7.5*  HCT 25.4* 25.2* 23.5*  PLT 154 145* 148*    Recent Labs Lab 12/28/16 1018 12/29/16 0918 12/30/16 0341 12/31/16 0151  NA 140 138 137 138  K 4.7 4.6 4.4 4.2  CL 105 109 105 106  CO2 25 23 25 26   BUN 30* 26* 30* 30*  CREATININE 1.81* 1.63* 1.61* 1.40*  CALCIUM 9.6 8.9 8.8* 8.7*  PROT 7.1  --   --   --   BILITOT 0.8  --   --   --  ALKPHOS 55  --   --   --   ALT 10*  --   --   --   AST 18  --   --   --   GLUCOSE 155* 114* 94 94   Lactic Acid 2.05 > 6.08 > 4.8 > 3.7 > 1.6 Troponin: 0.06 > 0.14 > 0.15  Imaging/Diagnostic Tests: Dg Chest Port 1 View  Result Date: 12/30/2016 CLINICAL  DATA:  Dyspnea. EXAM: PORTABLE CHEST 1 VIEW COMPARISON:  Radiograph December 29, 2016. FINDINGS: Stable cardiomegaly. Atherosclerosis of thoracic aorta is noted. No pneumothorax is noted. Large hiatal hernia is again noted. Bony thorax is unremarkable. Stable right midlung opacity is noted concerning for inflammation or atelectasis. Stable left basilar opacity is noted concerning for atelectasis or infiltrate with possible associated pleural effusion. IMPRESSION: Large hiatal hernia. Aortic atherosclerosis. Stable bilateral lung opacities are noted concerning for atelectasis or infiltrate, with probable left pleural effusion. Electronically Signed   By: Marijo Conception, M.D.   On: 12/30/2016 15:54   Dg Chest Port 1 View  Result Date: 12/29/2016 CLINICAL DATA:  Acute onset of respiratory distress. Initial encounter. EXAM: PORTABLE CHEST 1 VIEW COMPARISON:  Chest radiograph performed 12/28/2016 FINDINGS: The lungs are well-aerated. Right midlung and left basilar airspace opacities raise concern for multifocal pneumonia. There is no evidence of pleural effusion or pneumothorax. The cardiomediastinal silhouette is enlarged. No acute osseous abnormalities are seen. A large hiatal hernia is again noted. IMPRESSION: 1. Right midlung and left basilar airspace opacities raise concern for multifocal pneumonia. 2. Cardiomegaly. 3. Large hiatal hernia again noted. Electronically Signed   By: Garald Balding M.D.   On: 12/29/2016 01:53   Dg Chest Portable 1 View  Result Date: 12/28/2016 CLINICAL DATA:  Decreased oxygen saturation. EXAM: PORTABLE CHEST 1 VIEW COMPARISON:  PA and lateral chest 01/17/2016. FINDINGS: There is cardiomegaly and mild interstitial edema. No consolidative process, pneumothorax or effusion. Large hiatal hernia is noted. IMPRESSION: Cardiomegaly and mild interstitial edema. Large hiatal hernia. Electronically Signed   By: Inge Rise M.D.   On: 12/28/2016 11:07   Steve Rattler, DO 12/31/2016,  7:28 AM PGY-1, [place] Intern pager: [phone], text pages welcome

## 2016-12-31 NOTE — Evaluation (Signed)
Physical Therapy Evaluation Patient Details Name: Gina Wilkinson MRN: [id number] DOB: June 04, 1920 Today's Date: 12/31/2016   History of Present Illness  83 yo admitted SOB, sepsis. PMHx: AS, Afib, CHF, CKD, HTN, myelodysplastic syndrome  Clinical Impression  Pt very pleasant with limited activity tolerance at baseline and 24 hr caregivers for a couple of years. Pt able to walk to the doorway and back and daughter reports she can move about her house but unsure if she has to rest between rooms. Pt with decreased activity tolerance, strength and mobility who will benefit from acute therapy to maximize mobility, function and gait to return to PLOF with caregivers.   HR 93 sats 96% maintained on 3L BP 119/66    Follow Up Recommendations No PT follow up;Supervision/Assistance - 24 hour    Equipment Recommendations  None recommended by PT    Recommendations for Other Services       Precautions / Restrictions Precautions Precautions: Fall Restrictions Weight Bearing Restrictions: No      Mobility  Bed Mobility Overal bed mobility: Modified Independent             General bed mobility comments: increased time with rail and HOB 20degrees  Transfers Overall transfer level: Needs assistance   Transfers: Sit to/from Stand Sit to Stand: Min guard         General transfer comment: cues for hand placement and safety  Ambulation/Gait Ambulation/Gait assistance: Min guard Ambulation Distance (Feet): 40 Feet Assistive device: Rolling walker (2 wheeled) Gait Pattern/deviations: Step-through pattern;Decreased stride length;Trunk flexed   Gait velocity interpretation: at or above normal speed for age/gender General Gait Details: cues for position in RW, pt denied increased distance. This distance is very near her baseline  Financial trader Rankin (Stroke Patients Only)       Balance Overall balance assessment: Needs assistance    Sitting balance-Leahy Scale: Good       Standing balance-Leahy Scale: Poor                               Pertinent Vitals/Pain Pain Assessment: No/denies pain    Home Living Family/patient expects to be discharged to:: Private residence Living Arrangements: Alone Available Help at Discharge: Personal care attendant;Available 24 hours/day;Family Type of Home: House Home Access: Stairs to enter Entrance Stairs-Rails: Right Entrance Stairs-Number of Steps: 4 Home Layout: Two level;Able to live on main level with bedroom/bathroom Home Equipment: Gilford Rile - 2 wheels;Toilet riser;Bedside commode Additional Comments: 24/7 care attendents "comfort keepers"    Prior Function Level of Independence: Needs assistance   Gait / Transfers Assistance Needed: Uses RW  ADL's / Homemaking Assistance Needed: care attendents assist with some transfers and hygiene needs when asked, assist with bathing and dressing  Comments: uses home O2     Hand Dominance        Extremity/Trunk Assessment   Upper Extremity Assessment Upper Extremity Assessment: Generalized weakness    Lower Extremity Assessment Lower Extremity Assessment: Generalized weakness    Cervical / Trunk Assessment Cervical / Trunk Assessment: Kyphotic  Communication   Communication: HOH  Cognition Arousal/Alertness: Awake/alert Behavior During Therapy: WFL for tasks assessed/performed Overall Cognitive Status: History of cognitive impairments - at baseline                      General Comments  Exercises General Exercises - Lower Extremity Long Arc Quad: AROM;Both;Seated;10 reps Hip Flexion/Marching: AROM;Both;Seated;10 reps   Assessment/Plan    PT Assessment Patient needs continued PT services  PT Problem List Decreased mobility;Decreased safety awareness;Decreased activity tolerance;Decreased knowledge of use of DME;Decreased strength;Cardiopulmonary status limiting activity           PT Treatment Interventions DME instruction;Gait training;Stair training;Functional mobility training;Therapeutic exercise;Patient/family education;Therapeutic activities    PT Goals (Current goals can be found in the Care Plan section)  Acute Rehab PT Goals Patient Stated Goal: return home and to bible study PT Goal Formulation: With patient/family Time For Goal Achievement: 01/14/17 Potential to Achieve Goals: Good    Frequency Min 2X/week   Barriers to discharge        Co-evaluation               End of Session Equipment Utilized During Treatment: Oxygen Activity Tolerance: Patient tolerated treatment well Patient left: in chair;with chair alarm set;with call bell/phone within reach;with family/visitor present Nurse Communication: Mobility status         Time: [id number] PT Time Calculation (min) (ACUTE ONLY): 24 min   Charges:   PT Evaluation $PT Eval Moderate Complexity: 1 Procedure     PT G Codes:        Mistie Adney B Fynley Chrystal 01-20-2017, 9:55 AM  Elwyn Reach, [place]

## 2016-12-31 NOTE — Discharge Summary (Signed)
[facility] Discharge Summary  Patient name: Gina Wilkinson Medical record number: [id number] Date of birth: 1920/03/17 Age: 83 y.o. Gender: female Date of Admission: 12/28/2016  Date of Discharge: 01/02/17  Admitting Physician: Leeanne Rio, MD  Primary Care Provider: Lupita Dawn, MD Consultants: PT, OT  Indication for Hospitalization: sepsis  Discharge Diagnoses/Problem List:  CAP Influenza Severe aortic stenosis Chronic respiratory failure  Disposition: home  Discharge Condition: stable, improved  Discharge Exam: see progress note from day of discharge  Brief Hospital Course:   Gina Wilkinson is a 83 year old women with PMH of myelodysplastic syndrome, hypothyroidism, diastolic heart failure, RBBB, aortic stenosis, HTN, afib, CKD stage 4, and chronic respiratory failure who presented to [facility] ED with fever and weakness. She was admitted to [facility] for management. She was started on aztreonam and vancomycin for fever with lactic acid elevated to 2.05. She was also started on Tamiflu for flu like illness. Her home medical problems were stable and her home medications continued. After admission, Gina Wilkinson was becoming increasingly short of breath. She was requiring high flow nasal cannula and then non-re breather mask to keep oxygen saturations above 90%. She was transferred to the step down unit and placed on BiPAP. Repeat chest x-ray showed multifocal pneumonia. She was continued on antibiotics and Tamiflu and given tylenol for fever. She improved and was able to be transitioned back to the medical floor. Her oxygen requirements were weaned down to her home O2 of 2L. She was noted to have a decrease in her hemoglobin from 9 on admission to 7.5. She did not require blood transfusion. Hemoglobin at time of discharge was 8.5. It was recommended she follow up with her outpatient oncologist. PT evaluated patient and recommended 24 hour supervision at  home. Gina Wilkinson continued to improve and completed a 5 day course of antibiotics and Tamiflu. She was discharged home on 01/02/17 with 24 hour supervision.   Issues for Follow Up:  1. Follow up with oncologist (Dr. Alvy Bimler) for myelodysplastic syndrome, further doses of Aranesp 2. Monitor outpatient BP and titrate blood pressure medications as necessary 3. Continue home 24 hour supervision with caretakers  Significant Procedures: none  Significant Labs and Imaging:   Recent Labs Lab 12/30/16 0341 12/31/16 0151 01/01/17 0836  WBC 4.7 4.5 3.8*  HGB 7.9* 7.5* 8.5*  HCT 25.2* 23.5* 26.9*  PLT 145* 148* 181    Recent Labs Lab 12/28/16 1018 12/29/16 0918 12/30/16 0341 12/31/16 0151 01/01/17 0836  NA 140 138 137 138 139  K 4.7 4.6 4.4 4.2 4.1  CL 105 109 105 106 105  CO2 25 23 25 26 26   GLUCOSE 155* 114* 94 94 110*  BUN 30* 26* 30* 30* 28*  CREATININE 1.81* 1.63* 1.61* 1.40* 1.41*  CALCIUM 9.6 8.9 8.8* 8.7* 9.3  ALKPHOS 55  --   --   --   --   AST 18  --   --   --   --   ALT 10*  --   --   --   --   ALBUMIN 3.6  --   --   --   --      Dg Chest Port 1 View  Result Date: 12/30/2016 CLINICAL DATA:  Dyspnea. EXAM: PORTABLE CHEST 1 VIEW COMPARISON:  Radiograph December 29, 2016. FINDINGS: Stable cardiomegaly. Atherosclerosis of thoracic aorta is noted. No pneumothorax is noted. Large hiatal hernia is again noted. Bony thorax is unremarkable. Stable right  midlung opacity is noted concerning for inflammation or atelectasis. Stable left basilar opacity is noted concerning for atelectasis or infiltrate with possible associated pleural effusion. IMPRESSION: Large hiatal hernia. Aortic atherosclerosis. Stable bilateral lung opacities are noted concerning for atelectasis or infiltrate, with probable left pleural effusion. Electronically Signed   By: Marijo Conception, M.D.   On: 12/30/2016 15:54   Dg Chest Port 1 View  Result Date: 12/29/2016 CLINICAL DATA:  Acute onset of respiratory  distress. Initial encounter. EXAM: PORTABLE CHEST 1 VIEW COMPARISON:  Chest radiograph performed 12/28/2016 FINDINGS: The lungs are well-aerated. Right midlung and left basilar airspace opacities raise concern for multifocal pneumonia. There is no evidence of pleural effusion or pneumothorax. The cardiomediastinal silhouette is enlarged. No acute osseous abnormalities are seen. A large hiatal hernia is again noted. IMPRESSION: 1. Right midlung and left basilar airspace opacities raise concern for multifocal pneumonia. 2. Cardiomegaly. 3. Large hiatal hernia again noted. Electronically Signed   By: Garald Balding M.D.   On: 12/29/2016 01:53   Dg Chest Portable 1 View  Result Date: 12/28/2016 CLINICAL DATA:  Decreased oxygen saturation. EXAM: PORTABLE CHEST 1 VIEW COMPARISON:  PA and lateral chest 01/17/2016. FINDINGS: There is cardiomegaly and mild interstitial edema. No consolidative process, pneumothorax or effusion. Large hiatal hernia is noted. IMPRESSION: Cardiomegaly and mild interstitial edema. Large hiatal hernia. Electronically Signed   By: Inge Rise M.D.   On: 12/28/2016 11:07     TTE: Study Conclusions  - Left ventricle: The cavity size was normal. Wall thickness was   increased in a pattern of mild LVH. There was moderate focal   basal hypertrophy of the septum. Systolic function was vigorous.   The estimated ejection fraction was in the range of 65% to 70%.   Wall motion was normal; there were no regional wall motion   abnormalities. The study is not technically sufficient to allow   evaluation of LV diastolic function. - Aortic valve: Heavily calcified with severe stenosis. Vegetation   cannot be excluded. Mean gradient (S): 54 mm Hg. Peak gradient   (S): 85 mm Hg. Valve area (VTI): 0.42 cm^2. Valve area (Vmax):   0.42 cm^2. Valve area (Vmean): 0.41 cm^2. - Mitral valve: Moderately calcified annulus. Mildly thickened   leaflets . Mild stenosis. There was mild regurgitation.  Mean   gradient (D): 5 mm Hg. Valve area by continuity equation (using   LVOT flow): 1.18 cm^2. - Left atrium: Severely dilated. - Right ventricle: The cavity size was mildly dilated. Systolic   function was normal. - Right atrium: The atrium was mildly dilated. - Tricuspid valve: There was moderate regurgitation. - Pulmonary arteries: PA peak pressure: 63 mm Hg (S). - Inferior vena cava: The vessel was dilated. The respirophasic   diameter changes were blunted (< 50%), consistent with elevated   central venous pressure. - Pericardium, extracardiac: There was no pericardial effusion.  Impressions:  - Compared to a prior study in 12/2015, the LVEF is unchaged. There   is now critical aortic stenosis - mean gradient of 54 mmHg - AVA   around 0.4 cm2. In the setting of fever and CHF, I cannot exclude   endocarditis based on the heavy valve calcification. There is   mild mitral stenosis, severe LAE, mild RAE, moderate TR, RVSP 63   mmHg, dilated IVC.  Results/Tests Pending at Time of Discharge: none  Discharge Medications:  Allergies as of 01/02/2017      Reactions   Baclofen Other (See Comments)  Severe altered mental status, extreme sedation    Levaquin [levofloxacin] Anaphylaxis   Penicillins Anaphylaxis   Has patient had a PCN reaction causing immediate rash, facial/tongue/throat swelling, SOB or lightheadedness with hypotension: Yes Has patient had a PCN reaction causing severe rash involving mucus membranes or skin necrosis: No Has patient had a PCN reaction that required hospitalization No Has patient had a PCN reaction occurring within the last 10 years: No If all of the above answers are "NO", then may proceed with Cephalosporin use.   Sulfonamide Derivatives Anaphylaxis      Medication List    TAKE these medications   amLODipine 5 MG tablet Commonly known as:  NORVASC TAKE 1 TABLET (5 MG TOTAL) BY MOUTH DAILY.   aspirin 81 MG tablet Take 81 mg by mouth daily.    furosemide 20 MG tablet Commonly known as:  LASIX TAKE 1/2 A TABLET BY MOUTH EVERY DAY   levothyroxine 100 MCG tablet Commonly known as:  SYNTHROID, LEVOTHROID Take 1 tablet (100 mcg total) by mouth daily. What changed:  additional instructions   NON FORMULARY Oxygen 2 Liters every night and as needed   VITAMIN B-12 PO Take 3,000 mg by mouth daily.   VITAMIN D PO Take 5,000 Units by mouth daily.       Discharge Instructions: Please refer to Patient Instructions section of EMR for full details.  Patient was counseled important signs and symptoms that should prompt return to medical care, changes in medications, dietary instructions, activity restrictions, and follow up appointments.   Follow-Up Appointments: Follow-up Information    Dimas Chyle, MD. Go on 01/04/2017.   Specialty:  Family Medicine Why:  at 3pm Contact information: [id number] [place] [address] [phone]        Heath Lark, MD. Go on 01/14/2017.   Specialty:  Hematology and Oncology Contact information: [place] [id number] [place], DO 01/02/2017, 9:42 AM PGY-1, [place]

## 2016-12-31 NOTE — Progress Notes (Signed)
Patient has received room assignment, she will be moving to 6E23, RN has contacted both her daughters and given them the updated information.

## 2016-12-31 NOTE — Progress Notes (Signed)
OT Cancellation Note  Patient Details Name: Gina Wilkinson MRN: [id number] DOB: Sep 06, 1920   Cancelled Treatment:    Reason Eval/Treat Not Completed: OT screened, no needs identified, will sign off. Pt very near her baseline in ADL and mobility. She has 24 hour care of [employer] at home and all necessary equipment. Educated pt on importance of continuing to participate as much as possible with ADL and walk with staff.   [name] 12/31/2016, 11:52 AM  [phone]

## 2017-01-01 ENCOUNTER — Other Ambulatory Visit: Payer: Self-pay | Admitting: Hematology and Oncology

## 2017-01-01 ENCOUNTER — Telehealth: Payer: Self-pay

## 2017-01-01 DIAGNOSIS — D631 Anemia in chronic kidney disease: Secondary | ICD-10-CM

## 2017-01-01 DIAGNOSIS — D469 Myelodysplastic syndrome, unspecified: Secondary | ICD-10-CM

## 2017-01-01 DIAGNOSIS — L8990 Pressure ulcer of unspecified site, unspecified stage: Secondary | ICD-10-CM

## 2017-01-01 DIAGNOSIS — N184 Chronic kidney disease, stage 4 (severe): Secondary | ICD-10-CM | POA: Insufficient documentation

## 2017-01-01 HISTORY — DX: Pressure ulcer of unspecified site, unspecified stage: L89.90

## 2017-01-01 LAB — BASIC METABOLIC PANEL
Anion gap: 8 (ref 5–15)
BUN: 28 mg/dL — ABNORMAL HIGH (ref 6–20)
CALCIUM: 9.3 mg/dL (ref 8.9–10.3)
CO2: 26 mmol/L (ref 22–32)
Chloride: 105 mmol/L (ref 101–111)
Creatinine, Ser: 1.41 mg/dL — ABNORMAL HIGH (ref 0.44–1.00)
GFR, EST AFRICAN AMERICAN: 35 mL/min — AB (ref >60)
GFR, EST NON AFRICAN AMERICAN: 31 mL/min — AB (ref >60)
GLUCOSE: 110 mg/dL — AB (ref 65–99)
POTASSIUM: 4.1 mmol/L (ref 3.5–5.1)
Sodium: 139 mmol/L (ref 135–145)

## 2017-01-01 LAB — CBC
HEMATOCRIT: 26.9 % — AB (ref 36.0–46.0)
HEMOGLOBIN: 8.5 g/dL — AB (ref 12.0–15.0)
MCH: 29.7 pg (ref 26.0–34.0)
MCHC: 31.6 g/dL (ref 30.0–36.0)
MCV: 94.1 fL (ref 78.0–100.0)
Platelets: 181 10*3/uL (ref 150–400)
RBC: 2.86 MIL/uL — AB (ref 3.87–5.11)
RDW: 16.1 % — ABNORMAL HIGH (ref 11.5–15.5)
WBC: 3.8 10*3/uL — ABNORMAL LOW (ref 4.0–10.5)

## 2017-01-01 NOTE — Telephone Encounter (Signed)
I will order extra labs for tomorrow and placed a scheduling msg to see her on 2/19 for first dose Aranesp

## 2017-01-01 NOTE — Progress Notes (Signed)
Transitions of Care Pharmacy Note  Plan:  Educated on optimal lasix dosing (last dose before 4PM), s/sx stroke, ABX therapy completion --------------------------------------------- Gina Wilkinson is an 83 y.o. female who presents with a chief complaint fever, weakness, PNA. In anticipation of discharge, pharmacy has reviewed this patient's prior to admission medication history, as well as current inpatient medications listed per the [facility].  Current medication indications, dosing, frequency, and notable side effects reviewed with patient and family. patient and family verbalized understanding of current inpatient medication regimen and is aware that the After Visit Summary when presented, will represent the most accurate medication list at discharge.   [name] and her daughter expressed no concerns. Per patient's daughter, patient uses pill box and has 24 hr care at home. Per patient's daughter, takes lasix BID with last dose at 5 PM.   Assessment: Understanding of regimen: excellent Understanding of indications: good Potential of compliance: good  Barriers to Obtaining Medications: No  Patient instructed to contact inpatient pharmacy team with further questions or concerns if needed.    Time spent preparing for discharge counseling: 10 mins Time spent counseling patient: 10 mins   Thank you for allowing pharmacy to be a part of this patient's care.  Carlean Jews, Pharm.D. PGY1 Pharmacy Resident 2/6/20182:02 PM Pager [phone]

## 2017-01-01 NOTE — Telephone Encounter (Signed)
Daughter called to say [place] is in the hospital and expected to be discharged tomorrow from [facility]. Per daughter her blood count is down and her admitting MD wants her to start Aranesp injections and Dr. Alvy Bimler should be able to see this epic.

## 2017-01-01 NOTE — Progress Notes (Signed)
Family Medicine Teaching Service Daily Progress Note Intern Pager: [phone]  Patient name: Gina Wilkinson Medical record number: [id number] Date of birth: 03/15/1920 Age: 83 y.o. Gender: female  Primary Care Provider: Lupita Dawn, MD Consultants: none Code Status: DNR/DNI  Pt Overview and Major Events to Date:  2/2- admitted to [facility] for dyspnea 2/3- transferred to SDU 2/5- transferred out of SDU to med-surg floor  Assessment and Plan: 83 y.o. female presenting with fever and weakness. PMH is significant for Myleodysplastic syndrome, hypothyroidism, diastolic HF, RBBB, aortic stenosis, HTN, anemia, atrial fibrillation, CKD stage 4, chronic respiratory failure  Dyspnea, acute on chronic hypoxic respiratory failure- improving-  RSV panel negative. BNP 573.8. Echocardiogram with critical AS. Suspect secondary to Fluid Overload with 3L bolus given yesterday, but could also be symptomatic presentation of AS. - Monitor respiratory status.Currently on [place] at 3L, home O2 requirement of 2L at night and with activity -patient on home O2 - Vitals per unit - Up with assistance - PT and OT consulted- recommended 24 hour supervision, patient has 24 hour caregiver at home  Subjective fever and weakness- resolved repeat CXR morning of 2/3 with evidence of multifocal pneumonia. Contact with flu positive caregiver earlier this week. Flu swab negative but possible false negative. Lactic acid 6>1.1 following fluids. Echocardiogram with critical AS, heavy valve calcification (can't rule out vegetation and endocarditis). Per cardiology, no TEE necessary - Monitor fever curve - afebrile - Urine culture >100k colonies of aerococcus urinae, asymptomatic bacteruria  - Blood cultures no growth x 3 days - Patient transitioned to doxycycline on 2/5 for CAP - Tylenol as needed for fever - Treat with Tamiflu for influenza-like illness, 30mg  BID given low CrCl, on day 5/5  MDS (Myelodysplastic Syndrome)-  unclear if this active issue. Has anemia. Surgical path report from 2010 without clear MDS diagnosis. WBC normal. Hemoglobin 8>7.9>7.5>8.4. - Monitor daily CBC.  - If hemoglobin continues to drop consider transfusion, however cautious given fluid overload earlier in hospitalization -Would benefit from f/u OP with Onc  FEN/GI: regular diet PPx: heparin  Disposition: home pending clinical improvement  Subjective:  Gina Wilkinson is feeling well today, she had a good night. Daughter at bedside. Pleased with care. No SOB, no fevers or chills.  Objective: Temp:  [97.5 F (36.4 C)-98 F (36.7 C)] 97.6 F (36.4 C) (02/06 0419) Pulse Rate:  [74-93] 74 (02/06 0419) Resp:  [16-27] 16 (02/06 0419) BP: (91-122)/(57-83) 109/58 (02/06 0419) SpO2:  [94 %-100 %] 97 % (02/06 0419) Weight:  [136 lb 11 oz (62 kg)] 136 lb 11 oz (62 kg) (02/06 0210) Physical Exam: General: elderly lady sitting up in bedside chair, [place] in place. NAD Cardiovascular: irregular rhythm, regular rate, 3/6 systolic murmur Respiratory: clear to auscultation bilaterally, no increased work of breathing Abdomen: soft, nontender, nondistended, +BS Extremities: warm, well perfused, no edema or cyanosis  Laboratory:  Recent Labs Lab 12/30/16 0341 12/31/16 0151 01/01/17 0836  WBC 4.7 4.5 3.8*  HGB 7.9* 7.5* 8.5*  HCT 25.2* 23.5* 26.9*  PLT 145* 148* 181    Recent Labs Lab 12/28/16 1018 12/29/16 0918 12/30/16 0341 12/31/16 0151  NA 140 138 137 138  K 4.7 4.6 4.4 4.2  CL 105 109 105 106  CO2 25 23 25 26   BUN 30* 26* 30* 30*  CREATININE 1.81* 1.63* 1.61* 1.40*  CALCIUM 9.6 8.9 8.8* 8.7*  PROT 7.1  --   --   --   BILITOT 0.8  --   --   --  ALKPHOS 55  --   --   --   ALT 10*  --   --   --   AST 18  --   --   --   GLUCOSE 155* 114* 94 94   Lactic Acid 2.05 > 6.08 > 4.8 > 3.7 > 1.6 Troponin: 0.06 > 0.14 > 0.15  Imaging/Diagnostic Tests: Dg Chest Port 1 View  Result Date: 12/30/2016 CLINICAL DATA:  Dyspnea. EXAM:  PORTABLE CHEST 1 VIEW COMPARISON:  Radiograph December 29, 2016. FINDINGS: Stable cardiomegaly. Atherosclerosis of thoracic aorta is noted. No pneumothorax is noted. Large hiatal hernia is again noted. Bony thorax is unremarkable. Stable right midlung opacity is noted concerning for inflammation or atelectasis. Stable left basilar opacity is noted concerning for atelectasis or infiltrate with possible associated pleural effusion. IMPRESSION: Large hiatal hernia. Aortic atherosclerosis. Stable bilateral lung opacities are noted concerning for atelectasis or infiltrate, with probable left pleural effusion. Electronically Signed   By: Marijo Conception, M.D.   On: 12/30/2016 15:54   Dg Chest Port 1 View  Result Date: 12/29/2016 CLINICAL DATA:  Acute onset of respiratory distress. Initial encounter. EXAM: PORTABLE CHEST 1 VIEW COMPARISON:  Chest radiograph performed 12/28/2016 FINDINGS: The lungs are well-aerated. Right midlung and left basilar airspace opacities raise concern for multifocal pneumonia. There is no evidence of pleural effusion or pneumothorax. The cardiomediastinal silhouette is enlarged. No acute osseous abnormalities are seen. A large hiatal hernia is again noted. IMPRESSION: 1. Right midlung and left basilar airspace opacities raise concern for multifocal pneumonia. 2. Cardiomegaly. 3. Large hiatal hernia again noted. Electronically Signed   By: Garald Balding M.D.   On: 12/29/2016 01:53   Dg Chest Portable 1 View  Result Date: 12/28/2016 CLINICAL DATA:  Decreased oxygen saturation. EXAM: PORTABLE CHEST 1 VIEW COMPARISON:  PA and lateral chest 01/17/2016. FINDINGS: There is cardiomegaly and mild interstitial edema. No consolidative process, pneumothorax or effusion. Large hiatal hernia is noted. IMPRESSION: Cardiomegaly and mild interstitial edema. Large hiatal hernia. Electronically Signed   By: Inge Rise M.D.   On: 12/28/2016 11:07   Steve Rattler, DO 01/01/2017, 9:20 AM PGY-1, [place] Intern pager: [phone], text pages welcome

## 2017-01-01 NOTE — Care Management Important Message (Signed)
Important Message  Patient Details  Name: XYLIANA LINHARES MRN: [id number] Date of Birth: 1920-07-14   Medicare Important Message Given:  Yes    Laker Thompson 01/01/2017, 1:14 PM

## 2017-01-02 LAB — CULTURE, BLOOD (ROUTINE X 2)
Culture: NO GROWTH
Culture: NO GROWTH

## 2017-01-02 LAB — IRON AND TIBC
Iron: 40 ug/dL (ref 28–170)
SATURATION RATIOS: 20 % (ref 10.4–31.8)
TIBC: 202 ug/dL — ABNORMAL LOW (ref 250–450)
UIBC: 162 ug/dL

## 2017-01-02 LAB — SEDIMENTATION RATE: SED RATE: 35 mm/h — AB (ref 0–22)

## 2017-01-02 LAB — VITAMIN B12: VITAMIN B 12: 4760 pg/mL — AB (ref 180–914)

## 2017-01-02 LAB — FERRITIN: FERRITIN: 360 ng/mL — AB (ref 11–307)

## 2017-01-02 NOTE — Discharge Instructions (Signed)
Fall Prevention in the Home Introduction Falls can cause injuries. They can happen to people of all ages. There are many things you can do to make your home safe and to help prevent falls. What can I do on the outside of my home?  Regularly fix the edges of walkways and driveways and fix any cracks.  Remove anything that might make you trip as you walk through a door, such as a raised step or threshold.  Trim any bushes or trees on the path to your home.  Use bright outdoor lighting.  Clear any walking paths of anything that might make someone trip, such as rocks or tools.  Regularly check to see if handrails are loose or broken. Make sure that both sides of any steps have handrails.  Any raised decks and porches should have guardrails on the edges.  Have any leaves, snow, or ice cleared regularly.  Use sand or salt on walking paths during winter.  Clean up any spills in your garage right away. This includes oil or grease spills. What can I do in the bathroom?  Use night lights.  Install grab bars by the toilet and in the tub and shower. Do not use towel bars as grab bars.  Use non-skid mats or decals in the tub or shower.  If you need to sit down in the shower, use a plastic, non-slip stool.  Keep the floor dry. Clean up any water that spills on the floor as soon as it happens.  Remove soap buildup in the tub or shower regularly.  Attach bath mats securely with double-sided non-slip rug tape.  Do not have throw rugs and other things on the floor that can make you trip. What can I do in the bedroom?  Use night lights.  Make sure that you have a light by your bed that is easy to reach.  Do not use any sheets or blankets that are too big for your bed. They should not hang down onto the floor.  Have a firm chair that has side arms. You can use this for support while you get dressed.  Do not have throw rugs and other things on the floor that can make you trip. What can  I do in the kitchen?  Clean up any spills right away.  Avoid walking on wet floors.  Keep items that you use a lot in easy-to-reach places.  If you need to reach something above you, use a strong step stool that has a grab bar.  Keep electrical cords out of the way.  Do not use floor polish or wax that makes floors slippery. If you must use wax, use non-skid floor wax.  Do not have throw rugs and other things on the floor that can make you trip. What can I do with my stairs?  Do not leave any items on the stairs.  Make sure that there are handrails on both sides of the stairs and use them. Fix handrails that are broken or loose. Make sure that handrails are as long as the stairways.  Check any carpeting to make sure that it is firmly attached to the stairs. Fix any carpet that is loose or worn.  Avoid having throw rugs at the top or bottom of the stairs. If you do have throw rugs, attach them to the floor with carpet tape.  Make sure that you have a light switch at the top of the stairs and the bottom of the stairs. If you   do not have them, ask someone to add them for you. What else can I do to help prevent falls?  Wear shoes that:  Do not have high heels.  Have rubber bottoms.  Are comfortable and fit you well.  Are closed at the toe. Do not wear sandals.  If you use a stepladder:  Make sure that it is fully opened. Do not climb a closed stepladder.  Make sure that both sides of the stepladder are locked into place.  Ask someone to hold it for you, if possible.  Clearly mark and make sure that you can see:  Any grab bars or handrails.  First and last steps.  Where the edge of each step is.  Use tools that help you move around (mobility aids) if they are needed. These include:  Canes.  Walkers.  Scooters.  Crutches.  Turn on the lights when you go into a dark area. Replace any light bulbs as soon as they burn out.  Set up your furniture so you have a  clear path. Avoid moving your furniture around.  If any of your floors are uneven, fix them.  If there are any pets around you, be aware of where they are.  Review your medicines with your doctor. Some medicines can make you feel dizzy. This can increase your chance of falling. Ask your doctor what other things that you can do to help prevent falls. This information is not intended to replace advice given to you by your health care provider. Make sure you discuss any questions you have with your health care provider. Document Released: 09/08/2009 Document Revised: 04/19/2016 Document Reviewed: 12/17/2014  2017 [employer]vier  

## 2017-01-02 NOTE — Progress Notes (Signed)
Physical Therapy Treatment Patient Details Name: Gina Wilkinson MRN: [id number] DOB: May 01, 1920 Today's Date: 01/02/2017    History of Present Illness 83 yo admitted SOB, sepsis. PMHx: AS, Afib, CHF, CKD, HTN, myelodysplastic syndrome    PT Comments    Progressing well with gait stability and strength.  Stamina still low, but expect that to improve now that she has started feeling better.  Follow Up Recommendations  No PT follow up;Supervision/Assistance - 24 hour     Equipment Recommendations  None recommended by PT    Recommendations for Other Services Rehab consult     Precautions / Restrictions Precautions Precautions: Fall    Mobility  Bed Mobility Overal bed mobility: Modified Independent             General bed mobility comments: increased time and light use of the rail "there's a hump in the bed"  Transfers Overall transfer level: Needs assistance   Transfers: Sit to/from Stand Sit to Stand: Min guard            Ambulation/Gait Ambulation/Gait assistance: Min guard Ambulation Distance (Feet): 120 Feet Assistive device: Rolling walker (2 wheeled) Gait Pattern/deviations: Step-through pattern   Gait velocity interpretation: at or above normal speed for age/gender General Gait Details: generally steady, good positioning in the RW   Stairs            Wheelchair Mobility    Modified Rankin (Stroke Patients Only)       Balance     Sitting balance-Leahy Scale: Good       Standing balance-Leahy Scale: Poor                      Cognition Arousal/Alertness: Awake/alert Behavior During Therapy: WFL for tasks assessed/performed Overall Cognitive Status: Within Functional Limits for tasks assessed                      Exercises General Exercises - Lower Extremity Heel Slides: AROM;Strengthening;Both;15 reps;Supine (graded resistance) Hip ABduction/ADduction: AROM;Both;15 reps;Supine Straight Leg Raises:  AROM;Strengthening;Both;10 reps;Supine Other Exercises Other Exercises: bicep/tricep pressess x10 reps bil    General Comments General comments (skin integrity, edema, etc.): mid 90's on 2 L with gait EHR in the 90's      Pertinent Vitals/Pain Pain Assessment: Faces Faces Pain Scale: Hurts a little bit Pain Location: finger Pain Descriptors / Indicators: Grimacing Pain Intervention(s): Monitored during session    Home Living                      Prior Function            PT Goals (current goals can now be found in the care plan section) Acute Rehab PT Goals Patient Stated Goal: return home and to bible study PT Goal Formulation: With patient/family Time For Goal Achievement: 01/14/17 Potential to Achieve Goals: Good Progress towards PT goals: Progressing toward goals    Frequency    Min 2X/week      PT Plan Current plan remains appropriate    Co-evaluation             End of Session   Activity Tolerance: Patient tolerated treatment well Patient left: in bed;with call bell/phone within reach;with family/visitor present     Time: [id number] PT Time Calculation (min) (ACUTE ONLY): 24 min  Charges:  $Gait Training: 8-22 mins $Therapeutic Exercise: 8-22 mins  G CodesTessie Fass Haniyyah Sakuma 01/02/2017, 4:41 PM 01/02/2017  Donnella Sham, PT [phone]  (pager)

## 2017-01-02 NOTE — Progress Notes (Signed)
[name] to be D/C'd Home per MD order.  Discussed prescriptions and follow up appointments with the patient. Prescriptions given to patient, medication list explained in detail. Pt verbalized understanding.  Allergies as of 01/02/2017      Reactions   Baclofen Other (See Comments)   Severe altered mental status, extreme sedation    Levaquin [levofloxacin] Anaphylaxis   Penicillins Anaphylaxis   Has patient had a PCN reaction causing immediate rash, facial/tongue/throat swelling, SOB or lightheadedness with hypotension: Yes Has patient had a PCN reaction causing severe rash involving mucus membranes or skin necrosis: No Has patient had a PCN reaction that required hospitalization No Has patient had a PCN reaction occurring within the last 10 years: No If all of the above answers are "NO", then may proceed with Cephalosporin use.   Sulfonamide Derivatives Anaphylaxis      Medication List    TAKE these medications   amLODipine 5 MG tablet Commonly known as:  NORVASC TAKE 1 TABLET (5 MG TOTAL) BY MOUTH DAILY.   aspirin 81 MG tablet Take 81 mg by mouth daily.   furosemide 20 MG tablet Commonly known as:  LASIX TAKE 1/2 A TABLET BY MOUTH EVERY DAY   levothyroxine 100 MCG tablet Commonly known as:  SYNTHROID, LEVOTHROID Take 1 tablet (100 mcg total) by mouth daily. What changed:  additional instructions   NON FORMULARY Oxygen 2 Liters every night and as needed   VITAMIN B-12 PO Take 3,000 mg by mouth daily.   VITAMIN D PO Take 5,000 Units by mouth daily.       Vitals:   01/02/17 1000 01/02/17 1152  BP: 102/70 (!) 111/58  Pulse: 76 72  Resp: 18   Temp: 97.3 F (36.3 C) 98.4 F (36.9 C)    Skin clean, dry and intact without evidence of skin break down, no evidence of skin tears noted. IV catheter discontinued intact. Site without signs and symptoms of complications. Dressing and pressure applied. Pt denies pain at this time. No complaints noted.  An After  Visit Summary was printed and given to the patient. Patient escorted via [place], and D/C home via private auto.  Emilio Math, RN [facility] Phone [phone]

## 2017-01-02 NOTE — Progress Notes (Signed)
Family Medicine Teaching Service Daily Progress Note Intern Pager: [phone]  Patient name: Gina Wilkinson Medical record number: [id number] Date of birth: 06-28-1920 Age: 83 y.o. Gender: female  Primary Care Provider: Lupita Dawn, MD Consultants: none Code Status: DNR/DNI  Pt Overview and Major Events to Date:  2/2- admitted to [facility] for dyspnea 2/3- transferred to SDU 2/5- transferred out of SDU to med-surg floor  Assessment and Plan: 83 y.o. female presenting with fever and weakness. PMH is significant for Myleodysplastic syndrome, hypothyroidism, diastolic HF, RBBB, aortic stenosis, HTN, anemia, atrial fibrillation, CKD stage 4, chronic respiratory failure  Dyspnea, acute on chronic hypoxic respiratory failure- improving-  RSV panel negative. BNP 573.8. Echocardiogram with critical AS. Suspect secondary to Fluid Overload with 3L bolus given yesterday, but could also be symptomatic presentation of AS. - Monitor respiratory status.Currently on [place] at 3L, home O2 requirement of 2L at night and with activity -patient on home O2 - Vitals per unit - Up with assistance - PT and OT consulted- recommended 24 hour supervision, patient has 24 hour caregiver at home  Subjective fever and weakness- resolved repeat CXR morning of 2/3 with evidence of multifocal pneumonia. Contact with flu positive caregiver earlier this week. Flu swab negative but possible false negative. Lactic acid 6>1.1 following fluids. Echocardiogram with critical AS, heavy valve calcification (can't rule out vegetation and endocarditis). Per cardiology, no TEE necessary - Blood cultures no growth x 4 days - Patient completed 5 day course of doxycycline for CAP and 5 day course of Tamiflu - Tylenol as needed for fever  MDS (Myelodysplastic Syndrome)- unclear if this active issue. Has anemia. Surgical path report from 2010 without clear MDS diagnosis. WBC normal. Hemoglobin 8>7.9>7.5>8.4. - Monitor daily CBC.  - If  hemoglobin continues to drop consider transfusion, however cautious given fluid overload earlier in hospitalization -Has outpt follow up with oncologist -labs per oncology pending  FEN/GI: regular diet PPx: heparin  Disposition: home pending clinical improvement  Subjective:  Gina Wilkinson is doing well today, she is hopeful she can go home.  Objective: Temp:  [97.7 F (36.5 C)-99 F (37.2 C)] 97.7 F (36.5 C) (02/07 0429) Pulse Rate:  [75-82] 76 (02/07 0429) Resp:  [16-18] 16 (02/07 0429) BP: (101-124)/(52-63) 119/63 (02/07 0429) SpO2:  [93 %-97 %] 93 % (02/07 0429) Weight:  [133 lb (60.3 kg)] 133 lb (60.3 kg) (02/06 2100) Physical Exam: General: elderly lady sitting up in bed in NAD Cardiovascular: irregular rhythm, regular rate, 3/6 systolic murmur Respiratory: clear to auscultation bilaterally, no increased work of breathing Abdomen: soft, nontender, nondistended, +BS Extremities: warm, well perfused, no edema or cyanosis  Laboratory:  Recent Labs Lab 12/30/16 0341 12/31/16 0151 01/01/17 0836  WBC 4.7 4.5 3.8*  HGB 7.9* 7.5* 8.5*  HCT 25.2* 23.5* 26.9*  PLT 145* 148* 181    Recent Labs Lab 12/28/16 1018  12/30/16 0341 12/31/16 0151 01/01/17 0836  NA 140  < > 137 138 139  K 4.7  < > 4.4 4.2 4.1  CL 105  < > 105 106 105  CO2 25  < > 25 26 26   BUN 30*  < > 30* 30* 28*  CREATININE 1.81*  < > 1.61* 1.40* 1.41*  CALCIUM 9.6  < > 8.8* 8.7* 9.3  PROT 7.1  --   --   --   --   BILITOT 0.8  --   --   --   --   ALKPHOS 55  --   --   --   --  ALT 10*  --   --   --   --   AST 18  --   --   --   --   GLUCOSE 155*  < > 94 94 110*  < > = values in this interval not displayed. Lactic Acid 2.05 > 6.08 > 4.8 > 3.7 > 1.6 Troponin: 0.06 > 0.14 > 0.15  Imaging/Diagnostic Tests: Dg Chest Port 1 View  Result Date: 12/30/2016 CLINICAL DATA:  Dyspnea. EXAM: PORTABLE CHEST 1 VIEW COMPARISON:  Radiograph December 29, 2016. FINDINGS: Stable cardiomegaly. Atherosclerosis of  thoracic aorta is noted. No pneumothorax is noted. Large hiatal hernia is again noted. Bony thorax is unremarkable. Stable right midlung opacity is noted concerning for inflammation or atelectasis. Stable left basilar opacity is noted concerning for atelectasis or infiltrate with possible associated pleural effusion. IMPRESSION: Large hiatal hernia. Aortic atherosclerosis. Stable bilateral lung opacities are noted concerning for atelectasis or infiltrate, with probable left pleural effusion. Electronically Signed   By: Marijo Conception, M.D.   On: 12/30/2016 15:54   Dg Chest Port 1 View  Result Date: 12/29/2016 CLINICAL DATA:  Acute onset of respiratory distress. Initial encounter. EXAM: PORTABLE CHEST 1 VIEW COMPARISON:  Chest radiograph performed 12/28/2016 FINDINGS: The lungs are well-aerated. Right midlung and left basilar airspace opacities raise concern for multifocal pneumonia. There is no evidence of pleural effusion or pneumothorax. The cardiomediastinal silhouette is enlarged. No acute osseous abnormalities are seen. A large hiatal hernia is again noted. IMPRESSION: 1. Right midlung and left basilar airspace opacities raise concern for multifocal pneumonia. 2. Cardiomegaly. 3. Large hiatal hernia again noted. Electronically Signed   By: Garald Balding M.D.   On: 12/29/2016 01:53   Dg Chest Portable 1 View  Result Date: 12/28/2016 CLINICAL DATA:  Decreased oxygen saturation. EXAM: PORTABLE CHEST 1 VIEW COMPARISON:  PA and lateral chest 01/17/2016. FINDINGS: There is cardiomegaly and mild interstitial edema. No consolidative process, pneumothorax or effusion. Large hiatal hernia is noted. IMPRESSION: Cardiomegaly and mild interstitial edema. Large hiatal hernia. Electronically Signed   By: Inge Rise M.D.   On: 12/28/2016 11:07   Steve Rattler, DO 01/02/2017, 8:26 AM PGY-1, [place] Intern pager: [phone], text pages welcome

## 2017-01-03 ENCOUNTER — Telehealth: Payer: Self-pay | Admitting: Family Medicine

## 2017-01-03 LAB — ERYTHROPOIETIN: Erythropoietin: 95.4 m[IU]/mL — ABNORMAL HIGH (ref 2.6–18.5)

## 2017-01-03 NOTE — Telephone Encounter (Signed)
Daughter would like to know why the followup was scheduled for tomorrow when she was just discharged from the hospital on FEb7

## 2017-01-03 NOTE — Telephone Encounter (Signed)
Spoke with pt daughter and she is rescheduled for next Wednesday with dr Reesa Chew. Gina Wilkinson Kennon Holter, CMA

## 2017-01-03 NOTE — Telephone Encounter (Signed)
RN staff - please inform the daughter that this was scheduled when she was admitted, as she stayed longer than first expected the follow up ended up being only a few days after discharge. Offer to reschedule for early next week.

## 2017-01-04 ENCOUNTER — Inpatient Hospital Stay: Payer: Self-pay | Admitting: Family Medicine

## 2017-01-09 ENCOUNTER — Ambulatory Visit (INDEPENDENT_AMBULATORY_CARE_PROVIDER_SITE_OTHER): Payer: Medicare Other | Admitting: Family Medicine

## 2017-01-09 ENCOUNTER — Encounter: Payer: Self-pay | Admitting: Family Medicine

## 2017-01-09 VITALS — BP 138/60 | HR 63 | Temp 97.6°F | Ht 66.0 in | Wt 135.0 lb

## 2017-01-09 DIAGNOSIS — D649 Anemia, unspecified: Secondary | ICD-10-CM | POA: Diagnosis not present

## 2017-01-09 DIAGNOSIS — Z0000 Encounter for general adult medical examination without abnormal findings: Secondary | ICD-10-CM

## 2017-01-09 DIAGNOSIS — Z09 Encounter for follow-up examination after completed treatment for conditions other than malignant neoplasm: Secondary | ICD-10-CM

## 2017-01-09 LAB — CBC
HCT: 25.9 % — ABNORMAL LOW (ref 35.0–45.0)
Hemoglobin: 8.2 g/dL — ABNORMAL LOW (ref 11.7–15.5)
MCH: 29.8 pg (ref 27.0–33.0)
MCHC: 31.7 g/dL — AB (ref 32.0–36.0)
MCV: 94.2 fL (ref 80.0–100.0)
MPV: 9.2 fL (ref 7.5–12.5)
PLATELETS: 364 10*3/uL (ref 140–400)
RBC: 2.75 MIL/uL — AB (ref 3.80–5.10)
RDW: 17.1 % — ABNORMAL HIGH (ref 11.0–15.0)
WBC: 6.3 10*3/uL (ref 3.8–10.8)

## 2017-01-09 NOTE — Patient Instructions (Addendum)
It was so nice to meet you and your daughter today! You were seen in clinic for a hospital follow up.  We checked your hemoglobin level to make sure it has remained stable since hospitalization and I will follow up with you with that result.  Please keep your appointment with Dr. Alvy Bimler to evaluate if you need further doses of Aranesp.  Please call with any questions.    Lovenia Kim, MD

## 2017-01-09 NOTE — Progress Notes (Signed)
   Subjective:   Patient ID: Gina Wilkinson    DOB: Sep 07, 1920, 83 y.o. female   MRN: [id number]  CC: hospital follow up   HPI: Gina Wilkinson is a 83 y.o. female who presents to clinic today for hospital follow up . Patient was admitted 12/28/2016 and discharged 01/02/2017 for sepsis.   Pneumonia Patient reports she feels well ever since leaving the hospital.  Has been using home O2 at night (3L) and satting 97%.  Blood pressures at home have been about 123456 systolic.  She is here with her daughter today.  She has remained afebrile, no shortness of breath and has 24 hour supervision at home.    Myelodysplastic Syndrome Hemoglobin on discharge was 8.5.  Did not require transfusion.  Will recheck CBC today to see if stable.   -Patient has appointment with Dr. Alvy Bimler on 01/23/2017, will determine if needs further doses of Aranesp   ROS: Denies fevers, chills, burning on urination, nausea, vomiting, diarrhea, abdominal pain.   Smoking status reviewed. Patient is a never smoker.  Medications reviewed.  Objective:   BP 138/60   Pulse 63   Temp 97.6 F (36.4 C)   Ht 5\' 6"  (1.676 m)   Wt 135 lb (61.2 kg)   SpO2 97%   BMI 21.79 kg/m  Vitals and nursing note reviewed.  General: pleasant,  frail but well appearing 83 yo female, in no acute distress HEENT: NCAT, MMM, o/p clear, EOMI, conjunctival pallor Neck: supple, no LAD CV: RRR no MRG  Lungs: clear bilaterally with no increased work of breathing Abdomen: soft, non-tender, +bs Skin: warm, dry, no rashes or lesions Extremities: warm and well perfused, normal tone Neuro: alert, oriented, no focal deficits, strength 5/5 in bilateral UE  Assessment & Plan:   Pneumonia Resolved, patient reports feeling well.   Has remained afebrile and antibiotics completed. O2 sats 97% at home on 3L O2 at night.    Myelodysplastic Syndrome Hemoglobin of 8.5 on discharge -Will recheck CBC today to see if stable.   -Patient has appointment  with Dr. Alvy Bimler on 01/23/2017, will determine if needs further doses of Aranesp   Orders Placed This Encounter  Procedures  . CBC   Follow up: 1 month  Lovenia Kim, MD [place], PGY-1 01/10/2017 8:59 PM

## 2017-01-14 ENCOUNTER — Telehealth: Payer: Self-pay | Admitting: Hematology and Oncology

## 2017-01-14 ENCOUNTER — Ambulatory Visit (HOSPITAL_BASED_OUTPATIENT_CLINIC_OR_DEPARTMENT_OTHER): Payer: Medicare Other

## 2017-01-14 ENCOUNTER — Other Ambulatory Visit (HOSPITAL_BASED_OUTPATIENT_CLINIC_OR_DEPARTMENT_OTHER): Payer: Medicare Other

## 2017-01-14 ENCOUNTER — Ambulatory Visit (HOSPITAL_BASED_OUTPATIENT_CLINIC_OR_DEPARTMENT_OTHER): Payer: Medicare Other | Admitting: Hematology and Oncology

## 2017-01-14 DIAGNOSIS — D469 Myelodysplastic syndrome, unspecified: Secondary | ICD-10-CM

## 2017-01-14 DIAGNOSIS — D539 Nutritional anemia, unspecified: Secondary | ICD-10-CM

## 2017-01-14 DIAGNOSIS — N184 Chronic kidney disease, stage 4 (severe): Secondary | ICD-10-CM

## 2017-01-14 DIAGNOSIS — D631 Anemia in chronic kidney disease: Secondary | ICD-10-CM | POA: Diagnosis not present

## 2017-01-14 DIAGNOSIS — I5032 Chronic diastolic (congestive) heart failure: Secondary | ICD-10-CM

## 2017-01-14 LAB — CBC WITH DIFFERENTIAL/PLATELET
BASO%: 1.4 % (ref 0.0–2.0)
Basophils Absolute: 0.1 10*3/uL (ref 0.0–0.1)
EOS%: 5.1 % (ref 0.0–7.0)
Eosinophils Absolute: 0.3 10*3/uL (ref 0.0–0.5)
HCT: 26.4 % — ABNORMAL LOW (ref 34.8–46.6)
HGB: 8.6 g/dL — ABNORMAL LOW (ref 11.6–15.9)
LYMPH%: 16.7 % (ref 14.0–49.7)
MCH: 31.1 pg (ref 25.1–34.0)
MCHC: 32.6 g/dL (ref 31.5–36.0)
MCV: 95.3 fL (ref 79.5–101.0)
MONO#: 0.6 10*3/uL (ref 0.1–0.9)
MONO%: 11.1 % (ref 0.0–14.0)
NEUT%: 65.7 % (ref 38.4–76.8)
NEUTROS ABS: 3.4 10*3/uL (ref 1.5–6.5)
Platelets: 275 10*3/uL (ref 145–400)
RBC: 2.78 10*6/uL — AB (ref 3.70–5.45)
RDW: 18 % — ABNORMAL HIGH (ref 11.2–14.5)
WBC: 5.2 10*3/uL (ref 3.9–10.3)
lymph#: 0.9 10*3/uL (ref 0.9–3.3)

## 2017-01-14 MED ORDER — DARBEPOETIN ALFA 100 MCG/0.5ML IJ SOSY
100.0000 ug | PREFILLED_SYRINGE | Freq: Once | INTRAMUSCULAR | Status: AC
  Administered 2017-01-14: 100 ug via SUBCUTANEOUS
  Filled 2017-01-14: qty 0.5

## 2017-01-14 NOTE — Patient Instructions (Signed)

## 2017-01-14 NOTE — Telephone Encounter (Signed)
Gave dtr avs report and appointments for March thru May.

## 2017-01-17 ENCOUNTER — Encounter: Payer: Self-pay | Admitting: Hematology and Oncology

## 2017-01-17 NOTE — Assessment & Plan Note (Signed)
She carried a diagnosis of myelodysplastic syndrome in the past based on a bone marrow biopsy in 2010. She had been receiving darbepoetin for a while but has not needed any further injections since November 2015. However, she has recent progressive pancytopenia, likely exacerbated by recent infection. I recommend we resume darbepoetin injection to keep hemoglobin greater than 10.

## 2017-01-17 NOTE — Assessment & Plan Note (Signed)
She has acute on chronic renal failure. She continues on current medical management. Her blood pressure is low. I recommend she consult with her primary doctor for medication adjustment related to chronic renal failure

## 2017-01-17 NOTE — Assessment & Plan Note (Signed)
She has multifactorial anemia, combination of myelodysplastic syndrome and anemia chronic renal failure. I have ordered panel to exclude vitamin D deficiency recently. I recommend double port in injection every 3 weeks to keep hemoglobin greater than 10. If her hemoglobin dropped to less than 8, I will give her 1 unit of blood transfusion. We discussed some of the risks, benefits, and alternatives of erythropoietin stimulating agents such as Procrit or Aranesp. The patient is symptomatic from anemia and the EPO level is low. Some of the side-effects to be expected including risks of allergic reactions, skin rashes, headaches, risk of blood clots including heart attack and stroke. There is rare risks of causing growth of cancers.The patient is willing to proceed and went ahead to sign consent today.

## 2017-01-17 NOTE — Progress Notes (Signed)
[facility] OFFICE PROGRESS NOTE  Patient Care Team: Lupita Dawn, MD as PCP - General (Family Medicine) Evans Lance, MD as Consulting Physician (Cardiology) Heath Lark, MD as Consulting Physician (Hematology and Oncology)  SUMMARY OF ONCOLOGIC HISTORY:  This pleasant lady was diagnosed with a low-grade myelodysplastic syndrome characterized by isolated normochromic anemia.  Bone marrow done 02/01/2009 was hypercellular for age. Mild dyserythropoiesis. 3% blasts. Normal cytogenetics. No ring sideroblasts. Hemoglobin 9.8 at that time with platelet count 367,000. Ferritin 329. Erythropoietin 69.9. She has had a durable response to intermittent erythropoietin injections currently on Aranesp 300 mcg every 4 weeks. In December 2014, the patient had hip fracture requiring surgery and blood transfusion. In May 2015, we increased Darbopoetin to 500 mcg every 4 weeks. The last dose of injection was November 2015  INTERVAL HISTORY: Please see below for problem oriented charting. Since the patient is last seen here, she had recurrent hospitalization for various issues including congestive heart failure, chronic kidney failure, and recent infection. She was noted to have progressive anemia and had received intermittent blood transfusion. She is referred back here for discussion about anemia management. Since recent discharge, she felt better.  Denies chest pain, shortness of breath or dizziness. The patient denies any recent signs or symptoms of bleeding such as spontaneous epistaxis, hematuria or hematochezia. Marland Kitchen REVIEW OF SYSTEMS:   Constitutional: Denies fevers, chills or abnormal weight loss Eyes: Denies blurriness of vision Ears, nose, mouth, throat, and face: Denies mucositis or sore throat Respiratory: Denies cough, dyspnea or wheezes Cardiovascular: Denies palpitation, chest discomfort or lower extremity swelling Gastrointestinal:  Denies nausea, heartburn or change in bowel  habits Skin: Denies abnormal skin rashes Lymphatics: Denies new lymphadenopathy or easy bruising Neurological:Denies numbness, tingling or new weaknesses Behavioral/Psych: Mood is stable, no new changes  All other systems were reviewed with the patient and are negative.  I have reviewed the past medical history, past surgical history, social history and family history with the patient and they are unchanged from previous note.  ALLERGIES:  is allergic to baclofen; levaquin [levofloxacin]; penicillins; and sulfonamide derivatives.  MEDICATIONS:  Current Outpatient Prescriptions  Medication Sig Dispense Refill  . amLODipine (NORVASC) 5 MG tablet TAKE 1 TABLET (5 MG TOTAL) BY MOUTH DAILY. 90 tablet 1  . aspirin 81 MG tablet Take 81 mg by mouth daily.    . Cholecalciferol (VITAMIN D PO) Take 5,000 Units by mouth daily.    . Cyanocobalamin (VITAMIN B-12 PO) Take 3,000 mg by mouth daily.    . furosemide (LASIX) 20 MG tablet TAKE 1/2 A TABLET BY MOUTH EVERY DAY 30 tablet 3  . levothyroxine (SYNTHROID, LEVOTHROID) 100 MCG tablet Take 1 tablet (100 mcg total) by mouth daily. (Patient taking differently: Take 100 mcg by mouth daily. BRAND NAME ONLY) 90 tablet 1  . NON FORMULARY Oxygen 2 Liters every night and as needed     No current facility-administered medications for this visit.     PHYSICAL EXAMINATION: ECOG PERFORMANCE STATUS: 2 - Symptomatic, <50% confined to bed  Vitals:   01/14/17 1416  BP: (!) 106/47  Pulse: 60  Resp: 18  Temp: 98.6 F (37 C)   Filed Weights   01/14/17 1416  Weight: 136 lb 6.4 oz (61.9 kg)    GENERAL:alert, no distress and comfortable SKIN: skin color is pale, texture, turgor are normal, no rashes or significant lesions EYES: normal, Conjunctiva are pale and non-injected, sclera clear OROPHARYNX:no exudate, no erythema and lips, buccal mucosa,  and tongue normal  NECK: supple, thyroid normal size, non-tender, without nodularity LYMPH:  no palpable  lymphadenopathy in the cervical, axillary or inguinal LUNGS: clear to auscultation and percussion with normal breathing effort HEART: regular rate & rhythm with left systolic murmurs and no lower extremity edema ABDOMEN:abdomen soft, non-tender and normal bowel sounds Musculoskeletal:no cyanosis of digits and no clubbing  NEURO: alert & oriented x 3 with fluent speech, no focal motor/sensory deficits  LABORATORY DATA:  I have reviewed the data as listed    Component Value Date/Time   NA 139 01/01/2017 0836   NA 141 12/14/2014 1508   K 4.1 01/01/2017 0836   K 4.5 12/14/2014 1508   CL 105 01/01/2017 0836   CL 105 07/25/2012 1005   CO2 26 01/01/2017 0836   CO2 29 12/14/2014 1508   GLUCOSE 110 (H) 01/01/2017 0836   GLUCOSE 101 12/14/2014 1508   GLUCOSE 142 (H) 07/25/2012 1005   BUN 28 (H) 01/01/2017 0836   BUN 37.0 (H) 12/14/2014 1508   CREATININE 1.41 (H) 01/01/2017 0836   CREATININE 1.55 (H) 10/03/2016 0957   CREATININE 1.6 (H) 12/14/2014 1508   CALCIUM 9.3 01/01/2017 0836   CALCIUM 9.3 12/14/2014 1508   PROT 7.1 12/28/2016 1018   PROT 7.7 12/14/2014 1508   ALBUMIN 3.6 12/28/2016 1018   ALBUMIN 4.0 12/14/2014 1508   AST 18 12/28/2016 1018   AST 15 12/14/2014 1508   ALT 10 (L) 12/28/2016 1018   ALT 8 12/14/2014 1508   ALKPHOS 55 12/28/2016 1018   ALKPHOS 78 12/14/2014 1508   BILITOT 0.8 12/28/2016 1018   BILITOT 0.72 12/14/2014 1508   GFRNONAA 31 (L) 01/01/2017 0836   GFRNONAA 28 (L) 10/03/2016 0957   GFRAA 35 (L) 01/01/2017 0836   GFRAA 33 (L) 10/03/2016 0957    No results found for: SPEP, UPEP  Lab Results  Component Value Date   WBC 5.2 01/14/2017   NEUTROABS 3.4 01/14/2017   HGB 8.6 (L) 01/14/2017   HCT 26.4 (L) 01/14/2017   MCV 95.3 01/14/2017   PLT 275 01/14/2017      Chemistry      Component Value Date/Time   NA 139 01/01/2017 0836   NA 141 12/14/2014 1508   K 4.1 01/01/2017 0836   K 4.5 12/14/2014 1508   CL 105 01/01/2017 0836   CL 105  07/25/2012 1005   CO2 26 01/01/2017 0836   CO2 29 12/14/2014 1508   BUN 28 (H) 01/01/2017 0836   BUN 37.0 (H) 12/14/2014 1508   CREATININE 1.41 (H) 01/01/2017 0836   CREATININE 1.55 (H) 10/03/2016 0957   CREATININE 1.6 (H) 12/14/2014 1508      Component Value Date/Time   CALCIUM 9.3 01/01/2017 0836   CALCIUM 9.3 12/14/2014 1508   ALKPHOS 55 12/28/2016 1018   ALKPHOS 78 12/14/2014 1508   AST 18 12/28/2016 1018   AST 15 12/14/2014 1508   ALT 10 (L) 12/28/2016 1018   ALT 8 12/14/2014 1508   BILITOT 0.8 12/28/2016 1018   BILITOT 0.72 12/14/2014 1508       RADIOGRAPHIC STUDIES: I have personally reviewed the radiological images as listed and agreed with the findings in the report. Dg Chest Port 1 View  Result Date: 12/30/2016 CLINICAL DATA:  Dyspnea. EXAM: PORTABLE CHEST 1 VIEW COMPARISON:  Radiograph December 29, 2016. FINDINGS: Stable cardiomegaly. Atherosclerosis of thoracic aorta is noted. No pneumothorax is noted. Large hiatal hernia is again noted. Bony thorax is unremarkable. Stable right midlung opacity  is noted concerning for inflammation or atelectasis. Stable left basilar opacity is noted concerning for atelectasis or infiltrate with possible associated pleural effusion. IMPRESSION: Large hiatal hernia. Aortic atherosclerosis. Stable bilateral lung opacities are noted concerning for atelectasis or infiltrate, with probable left pleural effusion. Electronically Signed   By: Marijo Conception, M.D.   On: 12/30/2016 15:54   Dg Chest Port 1 View  Result Date: 12/29/2016 CLINICAL DATA:  Acute onset of respiratory distress. Initial encounter. EXAM: PORTABLE CHEST 1 VIEW COMPARISON:  Chest radiograph performed 12/28/2016 FINDINGS: The lungs are well-aerated. Right midlung and left basilar airspace opacities raise concern for multifocal pneumonia. There is no evidence of pleural effusion or pneumothorax. The cardiomediastinal silhouette is enlarged. No acute osseous abnormalities are seen. A  large hiatal hernia is again noted. IMPRESSION: 1. Right midlung and left basilar airspace opacities raise concern for multifocal pneumonia. 2. Cardiomegaly. 3. Large hiatal hernia again noted. Electronically Signed   By: Garald Balding M.D.   On: 12/29/2016 01:53   Dg Chest Portable 1 View  Result Date: 12/28/2016 CLINICAL DATA:  Decreased oxygen saturation. EXAM: PORTABLE CHEST 1 VIEW COMPARISON:  PA and lateral chest 01/17/2016. FINDINGS: There is cardiomegaly and mild interstitial edema. No consolidative process, pneumothorax or effusion. Large hiatal hernia is noted. IMPRESSION: Cardiomegaly and mild interstitial edema. Large hiatal hernia. Electronically Signed   By: Inge Rise M.D.   On: 12/28/2016 11:07    ASSESSMENT & PLAN:  MDS (myelodysplastic syndrome) She carried a diagnosis of myelodysplastic syndrome in the past based on a bone marrow biopsy in 2010. She had been receiving darbepoetin for a while but has not needed any further injections since November 2015. However, she has recent progressive pancytopenia, likely exacerbated by recent infection. I recommend we resume darbepoetin injection to keep hemoglobin greater than 10.  Anemia of chronic renal failure, stage 4 (severe) (HCC) She has acute on chronic renal failure. She continues on current medical management. Her blood pressure is low. I recommend she consult with her primary doctor for medication adjustment related to chronic renal failure  Chronic diastolic heart failure ([place]) She has chronic congestive heart failure.  Clinically, she appears euvolemic. Continue close follow-up with cardiologist and medical management  Deficiency anemia She has multifactorial anemia, combination of myelodysplastic syndrome and anemia chronic renal failure. I have ordered panel to exclude vitamin D deficiency recently. I recommend double port in injection every 3 weeks to keep hemoglobin greater than 10. If her hemoglobin dropped  to less than 8, I will give her 1 unit of blood transfusion. We discussed some of the risks, benefits, and alternatives of erythropoietin stimulating agents such as Procrit or Aranesp. The patient is symptomatic from anemia and the EPO level is low. Some of the side-effects to be expected including risks of allergic reactions, skin rashes, headaches, risk of blood clots including heart attack and stroke. There is rare risks of causing growth of cancers.The patient is willing to proceed and went ahead to sign consent today.     No orders of the defined types were placed in this encounter.  All questions were answered. The patient knows to call the clinic with any problems, questions or concerns. No barriers to learning was detected. I spent 25 minutes counseling the patient face to face. The total time spent in the appointment was 30 minutes and more than 50% was on counseling and review of test results     Heath Lark, MD 01/17/2017 6:25 AM

## 2017-01-17 NOTE — Assessment & Plan Note (Signed)
She has chronic congestive heart failure.  Clinically, she appears euvolemic. Continue close follow-up with cardiologist and medical management

## 2017-01-25 ENCOUNTER — Other Ambulatory Visit: Payer: Self-pay | Admitting: Family Medicine

## 2017-02-02 ENCOUNTER — Other Ambulatory Visit: Payer: Self-pay | Admitting: Family Medicine

## 2017-02-04 ENCOUNTER — Other Ambulatory Visit: Payer: Self-pay

## 2017-02-04 ENCOUNTER — Ambulatory Visit: Payer: Self-pay

## 2017-02-05 ENCOUNTER — Telehealth: Payer: Self-pay | Admitting: Hematology and Oncology

## 2017-02-05 NOTE — Telephone Encounter (Signed)
Spoke to Daughter. Called to r/s injection due to inclement weather. Patient is aware of new date and time.

## 2017-02-06 ENCOUNTER — Ambulatory Visit (HOSPITAL_BASED_OUTPATIENT_CLINIC_OR_DEPARTMENT_OTHER): Payer: Medicare Other

## 2017-02-06 ENCOUNTER — Other Ambulatory Visit (HOSPITAL_BASED_OUTPATIENT_CLINIC_OR_DEPARTMENT_OTHER): Payer: Medicare Other

## 2017-02-06 VITALS — BP 110/51 | HR 74 | Temp 97.6°F | Resp 18

## 2017-02-06 DIAGNOSIS — D631 Anemia in chronic kidney disease: Secondary | ICD-10-CM | POA: Diagnosis not present

## 2017-02-06 DIAGNOSIS — D469 Myelodysplastic syndrome, unspecified: Secondary | ICD-10-CM

## 2017-02-06 DIAGNOSIS — N184 Chronic kidney disease, stage 4 (severe): Secondary | ICD-10-CM

## 2017-02-06 LAB — CBC WITH DIFFERENTIAL/PLATELET
BASO%: 1.4 % (ref 0.0–2.0)
Basophils Absolute: 0.1 10*3/uL (ref 0.0–0.1)
EOS%: 4.7 % (ref 0.0–7.0)
Eosinophils Absolute: 0.3 10*3/uL (ref 0.0–0.5)
HCT: 31.1 % — ABNORMAL LOW (ref 34.8–46.6)
HEMOGLOBIN: 9.8 g/dL — AB (ref 11.6–15.9)
LYMPH#: 1.4 10*3/uL (ref 0.9–3.3)
LYMPH%: 22.5 % (ref 14.0–49.7)
MCH: 30.1 pg (ref 25.1–34.0)
MCHC: 31.5 g/dL (ref 31.5–36.0)
MCV: 95.4 fL (ref 79.5–101.0)
MONO#: 0.6 10*3/uL (ref 0.1–0.9)
MONO%: 9.4 % (ref 0.0–14.0)
NEUT%: 62 % (ref 38.4–76.8)
NEUTROS ABS: 3.9 10*3/uL (ref 1.5–6.5)
NRBC: 0 % (ref 0–0)
Platelets: 252 10*3/uL (ref 145–400)
RBC: 3.26 10*6/uL — AB (ref 3.70–5.45)
RDW: 16.8 % — AB (ref 11.2–14.5)
WBC: 6.4 10*3/uL (ref 3.9–10.3)

## 2017-02-06 MED ORDER — DARBEPOETIN ALFA 100 MCG/0.5ML IJ SOSY
100.0000 ug | PREFILLED_SYRINGE | Freq: Once | INTRAMUSCULAR | Status: AC
  Administered 2017-02-06: 100 ug via SUBCUTANEOUS
  Filled 2017-02-06: qty 0.5

## 2017-02-07 ENCOUNTER — Ambulatory Visit: Payer: Self-pay

## 2017-02-25 ENCOUNTER — Ambulatory Visit (HOSPITAL_BASED_OUTPATIENT_CLINIC_OR_DEPARTMENT_OTHER): Payer: Medicare Other

## 2017-02-25 ENCOUNTER — Other Ambulatory Visit (HOSPITAL_BASED_OUTPATIENT_CLINIC_OR_DEPARTMENT_OTHER): Payer: Medicare Other

## 2017-02-25 VITALS — BP 131/67 | HR 77 | Temp 97.8°F | Resp 18

## 2017-02-25 DIAGNOSIS — D631 Anemia in chronic kidney disease: Secondary | ICD-10-CM

## 2017-02-25 DIAGNOSIS — N184 Chronic kidney disease, stage 4 (severe): Secondary | ICD-10-CM

## 2017-02-25 DIAGNOSIS — D469 Myelodysplastic syndrome, unspecified: Secondary | ICD-10-CM

## 2017-02-25 LAB — CBC WITH DIFFERENTIAL/PLATELET
BASO%: 1 % (ref 0.0–2.0)
Basophils Absolute: 0.1 10*3/uL (ref 0.0–0.1)
EOS ABS: 0.3 10*3/uL (ref 0.0–0.5)
EOS%: 4.6 % (ref 0.0–7.0)
HEMATOCRIT: 31.8 % — AB (ref 34.8–46.6)
HGB: 10.3 g/dL — ABNORMAL LOW (ref 11.6–15.9)
LYMPH%: 21.1 % (ref 14.0–49.7)
MCH: 29.9 pg (ref 25.1–34.0)
MCHC: 32.4 g/dL (ref 31.5–36.0)
MCV: 92.4 fL (ref 79.5–101.0)
MONO#: 0.6 10*3/uL (ref 0.1–0.9)
MONO%: 9.8 % (ref 0.0–14.0)
NEUT#: 4 10*3/uL (ref 1.5–6.5)
NEUT%: 63.5 % (ref 38.4–76.8)
PLATELETS: 203 10*3/uL (ref 145–400)
RBC: 3.44 10*6/uL — AB (ref 3.70–5.45)
RDW: 15.9 % — ABNORMAL HIGH (ref 11.2–14.5)
WBC: 6.3 10*3/uL (ref 3.9–10.3)
lymph#: 1.3 10*3/uL (ref 0.9–3.3)
nRBC: 0 % (ref 0–0)

## 2017-02-25 MED ORDER — DARBEPOETIN ALFA 100 MCG/0.5ML IJ SOSY
100.0000 ug | PREFILLED_SYRINGE | Freq: Once | INTRAMUSCULAR | Status: AC
  Administered 2017-02-25: 100 ug via SUBCUTANEOUS
  Filled 2017-02-25: qty 0.5

## 2017-02-28 ENCOUNTER — Ambulatory Visit: Payer: Self-pay

## 2017-03-07 ENCOUNTER — Encounter: Payer: Self-pay | Admitting: Family Medicine

## 2017-03-07 ENCOUNTER — Ambulatory Visit (INDEPENDENT_AMBULATORY_CARE_PROVIDER_SITE_OTHER): Payer: Medicare Other | Admitting: Family Medicine

## 2017-03-07 VITALS — BP 124/64 | HR 86 | Temp 98.7°F | Wt 135.0 lb

## 2017-03-07 DIAGNOSIS — I272 Pulmonary hypertension, unspecified: Secondary | ICD-10-CM | POA: Diagnosis not present

## 2017-03-07 DIAGNOSIS — I4821 Permanent atrial fibrillation: Secondary | ICD-10-CM

## 2017-03-07 DIAGNOSIS — J9611 Chronic respiratory failure with hypoxia: Secondary | ICD-10-CM | POA: Diagnosis not present

## 2017-03-07 DIAGNOSIS — D631 Anemia in chronic kidney disease: Secondary | ICD-10-CM

## 2017-03-07 DIAGNOSIS — I482 Chronic atrial fibrillation: Secondary | ICD-10-CM

## 2017-03-07 DIAGNOSIS — N184 Chronic kidney disease, stage 4 (severe): Secondary | ICD-10-CM | POA: Diagnosis not present

## 2017-03-07 DIAGNOSIS — I350 Nonrheumatic aortic (valve) stenosis: Secondary | ICD-10-CM | POA: Diagnosis not present

## 2017-03-07 DIAGNOSIS — E039 Hypothyroidism, unspecified: Secondary | ICD-10-CM

## 2017-03-07 NOTE — Patient Instructions (Signed)
We are checking your thyroid today to decide if your thyroid medication is at an appropriate dose.

## 2017-03-08 ENCOUNTER — Encounter: Payer: Self-pay | Admitting: Family Medicine

## 2017-03-08 ENCOUNTER — Telehealth: Payer: Self-pay | Admitting: Family Medicine

## 2017-03-08 DIAGNOSIS — D631 Anemia in chronic kidney disease: Secondary | ICD-10-CM

## 2017-03-08 DIAGNOSIS — K449 Diaphragmatic hernia without obstruction or gangrene: Secondary | ICD-10-CM | POA: Insufficient documentation

## 2017-03-08 DIAGNOSIS — E038 Other specified hypothyroidism: Secondary | ICD-10-CM

## 2017-03-08 DIAGNOSIS — J9611 Chronic respiratory failure with hypoxia: Secondary | ICD-10-CM

## 2017-03-08 DIAGNOSIS — D539 Nutritional anemia, unspecified: Secondary | ICD-10-CM

## 2017-03-08 DIAGNOSIS — N184 Chronic kidney disease, stage 4 (severe): Principal | ICD-10-CM

## 2017-03-08 LAB — TSH: TSH: 0.301 u[IU]/mL — ABNORMAL LOW (ref 0.450–4.500)

## 2017-03-08 NOTE — Progress Notes (Signed)
   Subjective:    Patient ID: Gina Wilkinson, female    DOB: 05-29-1920, 83 y.o.   MRN: [id number] NANCI LAKATOS is accompanied by daughter, Moana Munford Sources of clinical information for visit is/are patient, relative(s) and past medical records. Nursing assessment for this office visit was reviewed with the patient for accuracy and revision.   HPI Problem List Items Addressed This Visit      High   Pulmonary HTN ([place]) - secondary to chronic respiratory failure - Type II presumably from Aortic stenosis - Never smoked - On oxygen just at night   Permanent atrial fibrillation (HCC) - taking  Aspirin only - Cardiologist is Dr Crissie Sickles   Chronic respiratory failure ([place]) - presumed secondary to Pulm hypertension/Aortic Stenosis   Severe aortic stenosis - Critical  - denies chest pain, syncope, worsening shortness of breath, edema   Anemia of chronic renal failure, stage 4 (severe) (HCC) - no confusion     Medium   Hypothyroidism - Primary - Longstanding issue for patient Patient presents for evaluation of thyroid function.  Symptoms consist of weight loss.  The symptoms are moderate.   The problem has been gradually worsening.   Previous thyroid studies include TSH which was slightly lowUncertain dose of LT4.   Relevant Orders   TSH (Completed)   Deficiency anemia     Unprioritized   RESOLVED: Aortic stenosis     Never smoked 24 hours care via agency   Review of Systems See hpi    Objective:   Physical Exam Rest SaO2 90% on 2L; Exertion SaO2 83% on Room Air; Exertion SaO2 on 2 l/min 90%; Rest SaO2 80% on Room air VS reviewed GEN: Alert, Cooperative, Groomed, NAD HEENT: PERRL; EAC bilaterally not occluded, TM's translucent with normal LM, (+) LR;                No cervical LAN, No thyromegaly, No palpable masses COR: RRR, No M/G/R, No JVD, Normal PMI size and location LUNGS: BCTA, No Acc mm use, speaking in full sentences ABDOMEN: (+)BS, soft, NT, ND, No  HSM, No palpable masses Gait: Normal speed using Walker, No significant path deviation, Step through +,  Psych: Normal affect/thought/speech/language    Assessment & Plan:

## 2017-03-08 NOTE — Assessment & Plan Note (Signed)
Established problem Controlled Continue home oxygen 2/L min for at least 16 hours a day.

## 2017-03-08 NOTE — Assessment & Plan Note (Signed)
Established problem TSH monitoring; Lst TSH slightly low. There is some uncertainty the dose of Gina Wilkinson's LT4.  Her dgt will look at home to see if it is 100 mcg or 125 mcg daily

## 2017-03-08 NOTE — Assessment & Plan Note (Signed)
Established problem. Stable. Continue current therapy on Oxygen at least 16 hours a day

## 2017-03-08 NOTE — Assessment & Plan Note (Signed)
Established problem. Stable.   

## 2017-03-08 NOTE — Telephone Encounter (Signed)
Will forward to MD. Bryon Parker,CMA  

## 2017-03-08 NOTE — Assessment & Plan Note (Signed)
Established problem Stable Critical Stenosis on last TTE No report syncope, chest pain or CHF Avoid afterload reduction. Keep up preload

## 2017-03-08 NOTE — Assessment & Plan Note (Signed)
Followed by Dr Alvy Bimler. Last CBC stable Hgb Epo therapy (+)

## 2017-03-08 NOTE — Telephone Encounter (Signed)
Daughter was told to call Dr. McDiarmid and let him know the dosage on the synthroid, it is 100 mcg. ep

## 2017-03-08 NOTE — Assessment & Plan Note (Signed)
Established problem Controlled On Aspirin No negative chronotrope therapy Adequate control.

## 2017-03-12 MED ORDER — LEVOTHYROXINE SODIUM 75 MCG PO TABS: 75.0000 ug | ORAL_TABLET | Freq: Every day | ORAL | 0 refills | Status: DC

## 2017-03-12 NOTE — Telephone Encounter (Signed)
I spoke with Ms Gina Wilkinson about slightly low TSH. Recommend decrease from 100 mcg LT4 daily to 75 mcg daily. Recheck TSH next ov in 3 months.  Rx LT4 75 mcg tablets, one tablet daily.

## 2017-03-12 NOTE — Assessment & Plan Note (Signed)
I spoke with Ms Gina Wilkinson about slightly low TSH. Recommend decrease from 100 mcg LT4 daily to 75 mcg daily. Recheck TSH next ov in 3 months.  Rx LT4 75 mcg tablets, one tablet daily.

## 2017-03-18 ENCOUNTER — Ambulatory Visit (HOSPITAL_BASED_OUTPATIENT_CLINIC_OR_DEPARTMENT_OTHER): Payer: Medicare Other

## 2017-03-18 ENCOUNTER — Other Ambulatory Visit (HOSPITAL_BASED_OUTPATIENT_CLINIC_OR_DEPARTMENT_OTHER): Payer: Medicare Other

## 2017-03-18 DIAGNOSIS — N184 Chronic kidney disease, stage 4 (severe): Secondary | ICD-10-CM

## 2017-03-18 DIAGNOSIS — D631 Anemia in chronic kidney disease: Secondary | ICD-10-CM

## 2017-03-18 DIAGNOSIS — D469 Myelodysplastic syndrome, unspecified: Secondary | ICD-10-CM

## 2017-03-18 LAB — CBC WITH DIFFERENTIAL/PLATELET
BASO%: 0.9 % (ref 0.0–2.0)
Basophils Absolute: 0.1 10*3/uL (ref 0.0–0.1)
EOS ABS: 0.3 10*3/uL (ref 0.0–0.5)
EOS%: 5 % (ref 0.0–7.0)
HCT: 33.5 % — ABNORMAL LOW (ref 34.8–46.6)
HEMOGLOBIN: 10.7 g/dL — AB (ref 11.6–15.9)
LYMPH%: 23.5 % (ref 14.0–49.7)
MCH: 29.2 pg (ref 25.1–34.0)
MCHC: 31.9 g/dL (ref 31.5–36.0)
MCV: 91.5 fL (ref 79.5–101.0)
MONO#: 0.7 10*3/uL (ref 0.1–0.9)
MONO%: 10 % (ref 0.0–14.0)
NEUT%: 60.6 % (ref 38.4–76.8)
NEUTROS ABS: 4.1 10*3/uL (ref 1.5–6.5)
NRBC: 0 % (ref 0–0)
Platelets: 237 10*3/uL (ref 145–400)
RBC: 3.66 10*6/uL — AB (ref 3.70–5.45)
RDW: 16.2 % — ABNORMAL HIGH (ref 11.2–14.5)
WBC: 6.8 10*3/uL (ref 3.9–10.3)
lymph#: 1.6 10*3/uL (ref 0.9–3.3)

## 2017-03-18 MED ORDER — DARBEPOETIN ALFA 100 MCG/0.5ML IJ SOSY
100.0000 ug | PREFILLED_SYRINGE | Freq: Once | INTRAMUSCULAR | Status: AC
  Administered 2017-03-18: 100 ug via SUBCUTANEOUS
  Filled 2017-03-18: qty 0.5

## 2017-04-08 ENCOUNTER — Telehealth: Payer: Self-pay | Admitting: Hematology and Oncology

## 2017-04-08 ENCOUNTER — Ambulatory Visit: Payer: Medicare Other

## 2017-04-08 ENCOUNTER — Other Ambulatory Visit (HOSPITAL_BASED_OUTPATIENT_CLINIC_OR_DEPARTMENT_OTHER): Payer: Medicare Other

## 2017-04-08 ENCOUNTER — Ambulatory Visit (HOSPITAL_BASED_OUTPATIENT_CLINIC_OR_DEPARTMENT_OTHER): Payer: Medicare Other | Admitting: Hematology and Oncology

## 2017-04-08 DIAGNOSIS — D631 Anemia in chronic kidney disease: Secondary | ICD-10-CM | POA: Diagnosis not present

## 2017-04-08 DIAGNOSIS — N184 Chronic kidney disease, stage 4 (severe): Secondary | ICD-10-CM | POA: Diagnosis not present

## 2017-04-08 DIAGNOSIS — D469 Myelodysplastic syndrome, unspecified: Secondary | ICD-10-CM

## 2017-04-08 LAB — CBC WITH DIFFERENTIAL/PLATELET
BASO%: 0.8 % (ref 0.0–2.0)
BASOS ABS: 0.1 10*3/uL (ref 0.0–0.1)
EOS%: 4.6 % (ref 0.0–7.0)
Eosinophils Absolute: 0.3 10*3/uL (ref 0.0–0.5)
HEMATOCRIT: 34.4 % — AB (ref 34.8–46.6)
HGB: 10.9 g/dL — ABNORMAL LOW (ref 11.6–15.9)
LYMPH%: 19.7 % (ref 14.0–49.7)
MCH: 28.9 pg (ref 25.1–34.0)
MCHC: 31.7 g/dL (ref 31.5–36.0)
MCV: 91.2 fL (ref 79.5–101.0)
MONO#: 0.7 10*3/uL (ref 0.1–0.9)
MONO%: 10.7 % (ref 0.0–14.0)
NEUT%: 64.2 % (ref 38.4–76.8)
NEUTROS ABS: 4.2 10*3/uL (ref 1.5–6.5)
PLATELETS: 212 10*3/uL (ref 145–400)
RBC: 3.77 10*6/uL (ref 3.70–5.45)
RDW: 16.5 % — ABNORMAL HIGH (ref 11.2–14.5)
WBC: 6.5 10*3/uL (ref 3.9–10.3)
lymph#: 1.3 10*3/uL (ref 0.9–3.3)
nRBC: 0 % (ref 0–0)

## 2017-04-08 NOTE — Telephone Encounter (Signed)
Gave patient AVS and calender per 5/14 los. Lab only on 6/15 per LOS

## 2017-04-09 ENCOUNTER — Encounter: Payer: Self-pay | Admitting: Hematology and Oncology

## 2017-04-09 NOTE — Progress Notes (Signed)
[facility] OFFICE PROGRESS NOTE  Patient Care Team: McDiarmid, Blane Ohara, MD as PCP - General (Family Medicine) Evans Lance, MD as Consulting Physician (Cardiology) Heath Lark, MD as Consulting Physician (Hematology and Oncology)  SUMMARY OF ONCOLOGIC HISTORY:  This pleasant lady was diagnosed with a low-grade myelodysplastic syndrome characterized by isolated normochromic anemia.  Bone marrow done 02/01/2009 was hypercellular for age. Mild dyserythropoiesis. 3% blasts. Normal cytogenetics. No ring sideroblasts. Hemoglobin 9.8 at that time with platelet count 367,000. Ferritin 329. Erythropoietin 69.9. She has had a durable response to intermittent erythropoietin injections currently on Aranesp 300 mcg every 4 weeks. In December 2014, the patient had hip fracture requiring surgery and blood transfusion. In May 2015, we increased Darbopoetin to 500 mcg every 4 weeks. The last dose of injection was November 2015 From 01/14/2017 to 03/18/2017, she received darbepoetin injection again due to anemia.  INTERVAL HISTORY: Please see below for problem oriented charting. She feels well. Her energy level is stable. Her sensation of shortness of breath is less. The patient denies any recent signs or symptoms of bleeding such as spontaneous epistaxis, hematuria or hematochezia.   REVIEW OF SYSTEMS:   Constitutional: Denies fevers, chills or abnormal weight loss Eyes: Denies blurriness of vision Ears, nose, mouth, throat, and face: Denies mucositis or sore throat Respiratory: Denies cough, dyspnea or wheezes Cardiovascular: Denies palpitation, chest discomfort or lower extremity swelling Gastrointestinal:  Denies nausea, heartburn or change in bowel habits Skin: Denies abnormal skin rashes Lymphatics: Denies new lymphadenopathy or easy bruising Neurological:Denies numbness, tingling or new weaknesses Behavioral/Psych: Mood is stable, no new changes  All other systems were reviewed  with the patient and are negative.  I have reviewed the past medical history, past surgical history, social history and family history with the patient and they are unchanged from previous note.  ALLERGIES:  is allergic to baclofen; levaquin [levofloxacin]; penicillins; and sulfonamide derivatives.  MEDICATIONS:  Current Outpatient Prescriptions  Medication Sig Dispense Refill  . amLODipine (NORVASC) 5 MG tablet TAKE 1 TABLET (5 MG TOTAL) BY MOUTH DAILY. 90 tablet 1  . aspirin 81 MG tablet Take 81 mg by mouth daily.    . Darbepoetin Alfa-Albumin (ARANESP IJ) Inject as directed every 14 (fourteen) days.    . furosemide (LASIX) 20 MG tablet TAKE 1/2 A TABLET BY MOUTH EVERY DAY 30 tablet 3  . levothyroxine (SYNTHROID, LEVOTHROID) 75 MCG tablet Take 1 tablet (75 mcg total) by mouth daily. 90 tablet 0  . OXYGEN Inhale 2 L/min into the lungs. Use oxygen supplementation at least 16 hours per day    . Cholecalciferol (VITAMIN D PO) Take 5,000 Units by mouth daily.    . Cyanocobalamin (VITAMIN B-12 PO) Take 3,000 mg by mouth daily.     No current facility-administered medications for this visit.     PHYSICAL EXAMINATION: ECOG PERFORMANCE STATUS: 1 - Symptomatic but completely ambulatory  Vitals:   04/08/17 1614  BP: (!) 119/48  Pulse: 60  Resp: 18  Temp: 97.9 F (36.6 C)   Filed Weights   04/08/17 1614  Weight: 136 lb 12.8 oz (62.1 kg)    GENERAL:alert, no distress and comfortable SKIN: skin color, texture, turgor are normal, no rashes or significant lesions Musculoskeletal:no cyanosis of digits and no clubbing  NEURO: alert & oriented x 3 with fluent speech, no focal motor/sensory deficits  LABORATORY DATA:  I have reviewed the data as listed    Component Value Date/Time   NA 139 01/01/2017 0836  NA 141 12/14/2014 1508   K 4.1 01/01/2017 0836   K 4.5 12/14/2014 1508   CL 105 01/01/2017 0836   CL 105 07/25/2012 1005   CO2 26 01/01/2017 0836   CO2 29 12/14/2014 1508    GLUCOSE 110 (H) 01/01/2017 0836   GLUCOSE 101 12/14/2014 1508   GLUCOSE 142 (H) 07/25/2012 1005   BUN 28 (H) 01/01/2017 0836   BUN 37.0 (H) 12/14/2014 1508   CREATININE 1.41 (H) 01/01/2017 0836   CREATININE 1.55 (H) 10/03/2016 0957   CREATININE 1.6 (H) 12/14/2014 1508   CALCIUM 9.3 01/01/2017 0836   CALCIUM 9.3 12/14/2014 1508   PROT 7.1 12/28/2016 1018   PROT 7.7 12/14/2014 1508   ALBUMIN 3.6 12/28/2016 1018   ALBUMIN 4.0 12/14/2014 1508   AST 18 12/28/2016 1018   AST 15 12/14/2014 1508   ALT 10 (L) 12/28/2016 1018   ALT 8 12/14/2014 1508   ALKPHOS 55 12/28/2016 1018   ALKPHOS 78 12/14/2014 1508   BILITOT 0.8 12/28/2016 1018   BILITOT 0.72 12/14/2014 1508   GFRNONAA 31 (L) 01/01/2017 0836   GFRNONAA 28 (L) 10/03/2016 0957   GFRAA 35 (L) 01/01/2017 0836   GFRAA 33 (L) 10/03/2016 0957    No results found for: SPEP, UPEP  Lab Results  Component Value Date   WBC 6.5 04/08/2017   NEUTROABS 4.2 04/08/2017   HGB 10.9 (L) 04/08/2017   HCT 34.4 (L) 04/08/2017   MCV 91.2 04/08/2017   PLT 212 04/08/2017      Chemistry      Component Value Date/Time   NA 139 01/01/2017 0836   NA 141 12/14/2014 1508   K 4.1 01/01/2017 0836   K 4.5 12/14/2014 1508   CL 105 01/01/2017 0836   CL 105 07/25/2012 1005   CO2 26 01/01/2017 0836   CO2 29 12/14/2014 1508   BUN 28 (H) 01/01/2017 0836   BUN 37.0 (H) 12/14/2014 1508   CREATININE 1.41 (H) 01/01/2017 0836   CREATININE 1.55 (H) 10/03/2016 0957   CREATININE 1.6 (H) 12/14/2014 1508      Component Value Date/Time   CALCIUM 9.3 01/01/2017 0836   CALCIUM 9.3 12/14/2014 1508   ALKPHOS 55 12/28/2016 1018   ALKPHOS 78 12/14/2014 1508   AST 18 12/28/2016 1018   AST 15 12/14/2014 1508   ALT 10 (L) 12/28/2016 1018   ALT 8 12/14/2014 1508   BILITOT 0.8 12/28/2016 1018   BILITOT 0.72 12/14/2014 1508     ASSESSMENT & PLAN:  MDS (myelodysplastic syndrome) (HCC) She carried a diagnosis of myelodysplastic syndrome in the past based on a  bone marrow biopsy in 2010. She had been receiving darbepoetin for a while but has not needed any further injections since November 2015. However, she has recent progressive pancytopenia, likely exacerbated by recent infection. With resumption of darbepoetin, her hemoglobin is improving nicely. Continue close observation.  Anemia of chronic renal failure, stage 4 (severe) (HCC) She has acute on chronic renal failure. She continues on current medical management. Her blood pressure is low. I recommend she consult with her primary doctor for medication adjustment related to chronic renal failure Since she is improving, hemoglobin has improved nicely. I would discontinue darbepoetin injection. I recommend close follow-up blood monitoring.   No orders of the defined types were placed in this encounter.  All questions were answered. The patient knows to call the clinic with any problems, questions or concerns. No barriers to learning was detected. I spent 10 minutes counseling  the patient face to face. The total time spent in the appointment was 15 minutes and more than 50% was on counseling and review of test results     Heath Lark, MD 04/09/2017 4:29 PM

## 2017-04-09 NOTE — Assessment & Plan Note (Addendum)
She has acute on chronic renal failure. She continues on current medical management. Her blood pressure is low. I recommend she consult with her primary doctor for medication adjustment related to chronic renal failure Since she is improving, hemoglobin has improved nicely. I would discontinue darbepoetin injection. I recommend close follow-up blood monitoring.

## 2017-04-09 NOTE — Assessment & Plan Note (Signed)
She carried a diagnosis of myelodysplastic syndrome in the past based on a bone marrow biopsy in 2010. She had been receiving darbepoetin for a while but has not needed any further injections since November 2015. However, she has recent progressive pancytopenia, likely exacerbated by recent infection. With resumption of darbepoetin, her hemoglobin is improving nicely. Continue close observation.

## 2017-04-21 ENCOUNTER — Encounter: Payer: Self-pay | Admitting: Family Medicine

## 2017-04-23 ENCOUNTER — Other Ambulatory Visit: Payer: Self-pay | Admitting: Family Medicine

## 2017-04-23 DIAGNOSIS — E038 Other specified hypothyroidism: Secondary | ICD-10-CM

## 2017-04-23 NOTE — Progress Notes (Signed)
Future order for TSH, approximate date 04/24/17.

## 2017-04-25 ENCOUNTER — Encounter: Payer: Self-pay | Admitting: Family Medicine

## 2017-04-25 ENCOUNTER — Ambulatory Visit (INDEPENDENT_AMBULATORY_CARE_PROVIDER_SITE_OTHER): Payer: Medicare Other | Admitting: Family Medicine

## 2017-04-25 VITALS — BP 122/72 | HR 66 | Temp 97.7°F | Wt 135.0 lb

## 2017-04-25 DIAGNOSIS — J9611 Chronic respiratory failure with hypoxia: Secondary | ICD-10-CM

## 2017-04-25 DIAGNOSIS — Z79899 Other long term (current) drug therapy: Secondary | ICD-10-CM

## 2017-04-25 DIAGNOSIS — R5383 Other fatigue: Secondary | ICD-10-CM | POA: Diagnosis not present

## 2017-04-25 DIAGNOSIS — N184 Chronic kidney disease, stage 4 (severe): Secondary | ICD-10-CM | POA: Diagnosis not present

## 2017-04-25 DIAGNOSIS — I272 Pulmonary hypertension, unspecified: Secondary | ICD-10-CM | POA: Diagnosis not present

## 2017-04-25 DIAGNOSIS — E039 Hypothyroidism, unspecified: Secondary | ICD-10-CM

## 2017-04-25 DIAGNOSIS — I5032 Chronic diastolic (congestive) heart failure: Secondary | ICD-10-CM | POA: Diagnosis not present

## 2017-04-25 DIAGNOSIS — I350 Nonrheumatic aortic (valve) stenosis: Secondary | ICD-10-CM

## 2017-04-25 LAB — COMPREHENSIVE METABOLIC PANEL
ALK PHOS: 60 IU/L (ref 39–117)
ALT: 6 IU/L (ref 0–32)
AST: 14 IU/L (ref 0–40)
Albumin/Globulin Ratio: 1.2 (ref 1.2–2.2)
Albumin: 4.1 g/dL (ref 3.2–4.6)
BUN/Creatinine Ratio: 19 (ref 12–28)
BUN: 33 mg/dL (ref 10–36)
Bilirubin Total: 0.5 mg/dL (ref 0.0–1.2)
CALCIUM: 9.8 mg/dL (ref 8.7–10.3)
CO2: 27 mmol/L (ref 18–29)
CREATININE: 1.75 mg/dL — AB (ref 0.57–1.00)
Chloride: 103 mmol/L (ref 96–106)
GFR calc Af Amer: 28 mL/min/{1.73_m2} — ABNORMAL LOW (ref >59)
GFR, EST NON AFRICAN AMERICAN: 24 mL/min/{1.73_m2} — AB (ref >59)
GLOBULIN, TOTAL: 3.3 g/dL (ref 1.5–4.5)
GLUCOSE: 186 mg/dL — AB (ref 65–99)
Potassium: 5.5 mmol/L — ABNORMAL HIGH (ref 3.5–5.2)
SODIUM: 139 mmol/L (ref 134–144)
Total Protein: 7.4 g/dL (ref 6.0–8.5)

## 2017-04-26 ENCOUNTER — Telehealth: Payer: Self-pay | Admitting: Family Medicine

## 2017-04-26 DIAGNOSIS — R5383 Other fatigue: Secondary | ICD-10-CM | POA: Insufficient documentation

## 2017-04-26 NOTE — Telephone Encounter (Signed)
I spoke with Gina Wilkinson about Lab results showing rise in SCr, blood glucose, and potassium. Informed her that TSH is pending.   Recommend holding Lasix for now. Avoid high potassium foods. Try to get addition 8 ounces fluid three times a day into Gina Wilkinson. Will recheck Scr and potassium and A1c next Thursday during follow up visit.

## 2017-04-26 NOTE — Assessment & Plan Note (Signed)
Uncertain if worsened. Recent decrease LT4 100 mcg to 75 mcg daily for TSH 0.301 in mid-April. Await TSH result to decide about appropriate replacement LT4 dose.

## 2017-04-26 NOTE — Assessment & Plan Note (Signed)
Established problem worsened SCr.  Estimated Creatinine Clearance: 18 mL/min (A) (by C-G formula based on SCr of 1.75 mg/dL (H)). Within range of SCr for last year of 1.4 to 1.8. Increased serum potassium. Recommend holding furosemide and recheck SCr in one week.

## 2017-04-26 NOTE — Assessment & Plan Note (Addendum)
Established problem. Stable. Exertional SaO2 = 88% on 2 L/min and 93% at rest on 2 L/min O2.  Continue current therapy

## 2017-04-26 NOTE — Assessment & Plan Note (Signed)
Established problem. Stable.   

## 2017-04-26 NOTE — Assessment & Plan Note (Addendum)
Possible worsening.  Symptomatic vs asymptomatic severe Aortic Stenosis Life expectancy 83 year-old base 2-3 years. Gait speed > 1 m/sec using rolling walker, stable weight. Adequate nutritional intake.

## 2017-04-26 NOTE — Progress Notes (Signed)
Subjective:    Patient ID: Gina Wilkinson, female    DOB: Nov 08, 1920, 83 y.o.   MRN: [id number] Gina Wilkinson is accompanied by patient and two dgts, Azayla Polo (primary caretaker) and [name].  Sources of clinical information for visit is/are patient, relative(s) and past medical records. Nursing assessment for this office visit was reviewed with the patient for accuracy and revision.  Code status: DNR(+)  HPI FATIGUE  Onset: within last month  Fatigue with exertion: yes  Physical limitations: no increase needs with ADL assistance Primarily motivational fatigue: no Primarily physical fatigue: yes  Symptoms Fever: no  Weight loss: no   Exertional chest pain: no  Dyspnea: yes, but not above baseline. Pt with O2 dependent COPD  Cough: no  New medications: yes, decrease of LT4 100 mcg to 75 mcg  03/07/17 Leg swelling: no  Orthopnea: no  PND: no  Skin changes: yes, puffiness around eyes is increased  Feeling depressed: no  Anhedonia: no  Altered appetite: no  Poor sleep: no   Hypothyroidism Longstanding issue for patient Dgt feels patient's fatigue is due to decrease in LT4 dose in April.  Reports that thisis what happended last time LT4 decreased.  Patient presents for evaluation of thyroid function.  Symptoms consist of fatigue, "don't feel peppy".  Symptoms have present for 1 month.  The symptoms are moderate.   The problem has been gradually worsening.   Previous thyroid studies include TSH 0.301 (low, 03/07/17).    CHRONIC KIDNEY DISEASE STAGE 4  - Onset:  2010, highest SCr in last year of 1.81 (12/28/16), lowest Scr in last year of 1.40 (12/31/16 - calculated GFR: 30 - NSAIDS: no - ACEI/ARB: no - Diuretics: yes, furosemide 10 mg daily, - Bone Metabolism: cholecalciferol,  no calcium supplement - Potassium: no supplement - Acidosis: no - PTH: no measurement - Anemia: Darapoietin per Hematology. Hg 10.9 g/dL (04/08/17) - Access: none   Aortic Stenosis - TTE  12/2016:  Mean gradient across aortic valve 54 mmHg. 85 mm Hg. Valve area (Vmean): 0.41 cm^2.  Heavily calcified with severe stenosis.  Mean gradient (S): 54 mm Hg. Left atrium: Severely dilated mild LVH. Focal basal hypertrophy of septum. EF% ~ 65 - 70% Estimated PAP 63 mmHg - Taking amlodipine 5 mg daily and Lasix 10 mg daily - Cardiologist: Dr Lovena Le. Last cardiology assessment by Dr Percival Spanish 12/30/16 during admission at [facility] with pneumonia. No further diagnostic or therapeutic interventions changes recommended. Agreed with Dr Lovena Le that patient should not be on anticoagulation bc of myelodysplatic syndrome and chronic anemia. Last outpatient consult with Dr Lovena Le 07/2017, he recommended holding off on additional evaluation of Aoertic Stenosis in asymptomatic patient.  - (+) exercise intolerance in last month.  Denies syncope and increase DOE/orthopnea/chest pain    PMH (relevant): Myelodysplasticsyndrome, HFpEF, PAH, Afib, CKD4 Review of Systems See HPI    Objective:   Physical Exam VS reviewed, Sa02 exertion 88% on 2 L/min O2.  93% at rest on 2 L/min Exertional Heart rate 1 GEN: Alert, Cooperative, Groomed, NAD HEENT: PERRL; EAC bilaterally not occluded, TM's translucent with normal LM, (+) LR;                No cervical LAN, No thyromegaly, No palpable masses COR: irr irr, harsh 3/6 sys murmur with rad bil carotids, No JVD LUNGS: BCTA, No Acc mm use, speaking in full sentences EXT: No peripheral leg edema. Neuro: Oriented to person, place, and time;DTR: Bilateral Bicep 2+, Bilateral  Triceps 2+, Bilateral Knees 2+, Bilateral Ankles 1+ Gait: Sit to stand without difficulty. Normal speed walking with rollator, No significant path deviation, Step through +,  Psych: Normal affect/thought/speech/language    Assessment & Plan:

## 2017-04-26 NOTE — Assessment & Plan Note (Signed)
Established problem, possibly worsened with development of symptoms (new exercise intolerance).  May need to see her cardiologist, Dr Lovena Le, sooner than this fall.

## 2017-04-26 NOTE — Assessment & Plan Note (Addendum)
New problem Further workup including TSH, CMET. Multiple potential contributing factors: Endocrine (hypothyroidism with inadequate LT4 replacement, hyperglycemia of DMT2) Cardiac (symptomatic aortic stenosis, Pulmonary artery hypertension of mixed cardiac and pulmonary origins, Afib), COPD If TSH WNL, recommend seeing cardiology as to whether cardiac process, e.g., severe AS, could be significant contributing factor. Need to check A1c to look at long term glycemic control

## 2017-04-29 ENCOUNTER — Telehealth: Payer: Self-pay | Admitting: Family Medicine

## 2017-04-29 NOTE — Telephone Encounter (Signed)
Will forward to MD to advise. Grayson White,CMA  

## 2017-04-29 NOTE — Telephone Encounter (Signed)
Daughter is calling to see what her mother's thyroid results are. She needs to know if there any changes to her medication. jw

## 2017-04-30 ENCOUNTER — Other Ambulatory Visit: Payer: Medicare Other

## 2017-04-30 ENCOUNTER — Other Ambulatory Visit: Payer: Self-pay | Admitting: Family Medicine

## 2017-04-30 DIAGNOSIS — N184 Chronic kidney disease, stage 4 (severe): Secondary | ICD-10-CM | POA: Diagnosis not present

## 2017-04-30 DIAGNOSIS — N189 Chronic kidney disease, unspecified: Secondary | ICD-10-CM | POA: Insufficient documentation

## 2017-04-30 DIAGNOSIS — R739 Hyperglycemia, unspecified: Secondary | ICD-10-CM | POA: Insufficient documentation

## 2017-04-30 DIAGNOSIS — N179 Acute kidney failure, unspecified: Secondary | ICD-10-CM | POA: Insufficient documentation

## 2017-04-30 DIAGNOSIS — E038 Other specified hypothyroidism: Secondary | ICD-10-CM

## 2017-04-30 DIAGNOSIS — D631 Anemia in chronic kidney disease: Secondary | ICD-10-CM

## 2017-04-30 NOTE — Telephone Encounter (Signed)
I informed Gina Wilkinson that Gina Wilkinson has desposed of her mother blood aliquot from 04/25/17. Gina Wilkinson kindly agreed to come by this afternoon to have TSh , A1c and BMET drawn.  She may be after 4 pm, but I believe we need to accommodate her given this situation was created by the health system.

## 2017-04-30 NOTE — Telephone Encounter (Signed)
I spoke with Ms Gina Wilkinson. The TSH was not performed on blood draw from 04/25/17. Requested TSH run on aliquot form 5/31.

## 2017-05-01 ENCOUNTER — Telehealth: Payer: Self-pay | Admitting: Family Medicine

## 2017-05-01 DIAGNOSIS — E038 Other specified hypothyroidism: Secondary | ICD-10-CM

## 2017-05-01 LAB — COMPREHENSIVE METABOLIC PANEL

## 2017-05-01 LAB — BASIC METABOLIC PANEL
BUN / CREAT RATIO: 24 (ref 12–28)
BUN: 29 mg/dL (ref 10–36)
CALCIUM: 9.5 mg/dL (ref 8.7–10.3)
CHLORIDE: 101 mmol/L (ref 96–106)
CO2: 26 mmol/L (ref 18–29)
Creatinine, Ser: 1.22 mg/dL — ABNORMAL HIGH (ref 0.57–1.00)
GFR calc non Af Amer: 38 mL/min/{1.73_m2} — ABNORMAL LOW (ref >59)
GFR, EST AFRICAN AMERICAN: 44 mL/min/{1.73_m2} — AB (ref >59)
GLUCOSE: 104 mg/dL — AB (ref 65–99)
Potassium: 4.9 mmol/L (ref 3.5–5.2)
Sodium: 141 mmol/L (ref 134–144)

## 2017-05-01 LAB — HEMOGLOBIN A1C
ESTIMATED AVERAGE GLUCOSE: 120 mg/dL
HEMOGLOBIN A1C: 5.8 % — AB (ref 4.8–5.6)

## 2017-05-01 LAB — TSH: TSH: 5.35 u[IU]/mL — ABNORMAL HIGH (ref 0.450–4.500)

## 2017-05-01 LAB — PHOSPHORUS: Phosphorus: 3.5 mg/dL (ref 2.5–4.5)

## 2017-05-01 LAB — MAGNESIUM: MAGNESIUM: 2.1 mg/dL (ref 1.6–2.3)

## 2017-05-01 MED ORDER — LEVOTHYROXINE SODIUM 88 MCG PO TABS: 88.0000 ug | ORAL_TABLET | Freq: Every day | ORAL | 1 refills | Status: DC

## 2017-05-01 NOTE — Assessment & Plan Note (Signed)
Established problem Inadequate LT4 replacement (Synthroid brand name only) Plan: Increase Synthroid from 75 mcg daily to 88 mcg daily Therapy monitoring TSH at [facility] Lab visit only in 4 to 6 weeks.    Lab Results  Component Value Date   TSH 5.350 (H) 04/30/2017

## 2017-05-01 NOTE — Telephone Encounter (Signed)
Lab Results  Component Value Date   TSH 5.350 (H) 04/30/2017   A/ Hypothyroidism      - under supplemented with LT4      Acute on CKD - 4      - lower SCr with stopping Lasix 10 mg daily and increase fluid intake.      Hyperkalemia      - Resolved.  Suspect contribution from frequent tomatoes in diet and AcCKD      Hyperglycemia       - Glucose Intolerance, A1c 5.8%  P/ Increase Synthroid from 75 mcg to 88 mcg daily Use Lasix only prn daily Limit potassium rich foods in diet. Pt to come in for lab visit only in approximately 4 to 6 weeks for therapy monitoring with TSH. Future TSH lab order submitted.

## 2017-05-02 ENCOUNTER — Ambulatory Visit: Payer: Medicare Other

## 2017-05-04 LAB — TSH: TSH: 5.47 u[IU]/mL — ABNORMAL HIGH (ref 0.450–4.500)

## 2017-05-07 ENCOUNTER — Telehealth: Payer: Self-pay | Admitting: Family Medicine

## 2017-05-07 NOTE — Telephone Encounter (Signed)
Pt's feet were swollen last night, so this morning [name] gave pt 1/2 a lasix. Feet look better tonight. [name] wants to know if she could give pt 1/2 tablet of Lasix every other day. ep

## 2017-05-07 NOTE — Telephone Encounter (Signed)
Will forward to PCP.  Jasmon Mattice L, RN  

## 2017-05-08 NOTE — Telephone Encounter (Signed)
LM for daughter to call back.  Gina Wilkinson,CMA  

## 2017-05-08 NOTE — Telephone Encounter (Signed)
Daughter was informed. ep

## 2017-05-08 NOTE — Telephone Encounter (Signed)
Recommend that Gina Wilkinson receive an additional 1/2 tablet dose of Lasix by family as needed for leg swelling.  Please inform patient's dgt, [name], above recommendation

## 2017-05-09 ENCOUNTER — Telehealth: Payer: Self-pay | Admitting: *Deleted

## 2017-05-09 ENCOUNTER — Ambulatory Visit (HOSPITAL_BASED_OUTPATIENT_CLINIC_OR_DEPARTMENT_OTHER): Payer: Medicare Other

## 2017-05-09 ENCOUNTER — Other Ambulatory Visit (HOSPITAL_BASED_OUTPATIENT_CLINIC_OR_DEPARTMENT_OTHER): Payer: Medicare Other

## 2017-05-09 DIAGNOSIS — N184 Chronic kidney disease, stage 4 (severe): Secondary | ICD-10-CM | POA: Diagnosis present

## 2017-05-09 DIAGNOSIS — D631 Anemia in chronic kidney disease: Secondary | ICD-10-CM

## 2017-05-09 DIAGNOSIS — D469 Myelodysplastic syndrome, unspecified: Secondary | ICD-10-CM

## 2017-05-09 LAB — CBC WITH DIFFERENTIAL/PLATELET
BASO%: 1 % (ref 0.0–2.0)
Basophils Absolute: 0.1 10*3/uL (ref 0.0–0.1)
EOS%: 5.2 % (ref 0.0–7.0)
Eosinophils Absolute: 0.3 10*3/uL (ref 0.0–0.5)
HCT: 30.7 % — ABNORMAL LOW (ref 34.8–46.6)
HEMOGLOBIN: 9.7 g/dL — AB (ref 11.6–15.9)
LYMPH#: 0.8 10*3/uL — AB (ref 0.9–3.3)
LYMPH%: 13.2 % — ABNORMAL LOW (ref 14.0–49.7)
MCH: 28.8 pg (ref 25.1–34.0)
MCHC: 31.6 g/dL (ref 31.5–36.0)
MCV: 91.1 fL (ref 79.5–101.0)
MONO#: 0.6 10*3/uL (ref 0.1–0.9)
MONO%: 9.2 % (ref 0.0–14.0)
NEUT%: 71.4 % (ref 38.4–76.8)
NEUTROS ABS: 4.5 10*3/uL (ref 1.5–6.5)
NRBC: 0 % (ref 0–0)
Platelets: 222 10*3/uL (ref 145–400)
RBC: 3.37 10*6/uL — ABNORMAL LOW (ref 3.70–5.45)
RDW: 17.5 % — AB (ref 11.2–14.5)
WBC: 6.3 10*3/uL (ref 3.9–10.3)

## 2017-05-09 MED ORDER — DARBEPOETIN ALFA 100 MCG/0.5ML IJ SOSY
100.0000 ug | PREFILLED_SYRINGE | Freq: Once | INTRAMUSCULAR | Status: AC
  Administered 2017-05-09: 100 ug via SUBCUTANEOUS
  Filled 2017-05-09: qty 0.5

## 2017-05-09 NOTE — Telephone Encounter (Signed)
Spoke with daughter.Pt to receive in injection today

## 2017-05-10 ENCOUNTER — Other Ambulatory Visit: Payer: Self-pay

## 2017-05-16 ENCOUNTER — Telehealth: Payer: Self-pay | Admitting: Hematology and Oncology

## 2017-05-16 NOTE — Telephone Encounter (Signed)
Appointments scheduled per 05/16/17 sch msg. Patient was given a copy of the appointment schedule, per 05/16/17 sch msg.

## 2017-06-12 ENCOUNTER — Telehealth: Payer: Self-pay | Admitting: Family Medicine

## 2017-06-12 ENCOUNTER — Other Ambulatory Visit: Payer: Self-pay | Admitting: Family Medicine

## 2017-06-12 DIAGNOSIS — E038 Other specified hypothyroidism: Secondary | ICD-10-CM

## 2017-06-12 NOTE — Telephone Encounter (Signed)
Please let Gina Wilkinson's daughter, Massiel Stipp, know that her mother's thyroid lab test has been ordered.  It may be drawn around 06/20/17.

## 2017-06-12 NOTE — Telephone Encounter (Signed)
Daughter is aware.  Jazmin Hartsell,CMA  

## 2017-06-12 NOTE — Telephone Encounter (Signed)
Rod Gina Wilkinson called and wanted to know if Dr. McDiarmid can put the orders in for her mother to have her thyroid test done while she is at her appointment on 06/20/17 at [facility]. This was she doesn't have to go to 2 different places. jw

## 2017-06-20 ENCOUNTER — Other Ambulatory Visit: Payer: Self-pay | Admitting: *Deleted

## 2017-06-20 ENCOUNTER — Ambulatory Visit (HOSPITAL_BASED_OUTPATIENT_CLINIC_OR_DEPARTMENT_OTHER): Payer: Medicare Other

## 2017-06-20 ENCOUNTER — Other Ambulatory Visit (HOSPITAL_BASED_OUTPATIENT_CLINIC_OR_DEPARTMENT_OTHER): Payer: Medicare Other

## 2017-06-20 ENCOUNTER — Telehealth: Payer: Self-pay | Admitting: *Deleted

## 2017-06-20 VITALS — BP 115/55 | HR 82 | Temp 97.0°F | Resp 20

## 2017-06-20 DIAGNOSIS — E039 Hypothyroidism, unspecified: Secondary | ICD-10-CM | POA: Diagnosis not present

## 2017-06-20 DIAGNOSIS — N184 Chronic kidney disease, stage 4 (severe): Secondary | ICD-10-CM

## 2017-06-20 DIAGNOSIS — D469 Myelodysplastic syndrome, unspecified: Secondary | ICD-10-CM

## 2017-06-20 DIAGNOSIS — D631 Anemia in chronic kidney disease: Secondary | ICD-10-CM | POA: Diagnosis not present

## 2017-06-20 DIAGNOSIS — E038 Other specified hypothyroidism: Secondary | ICD-10-CM

## 2017-06-20 LAB — CBC WITH DIFFERENTIAL/PLATELET
BASO%: 0.8 % (ref 0.0–2.0)
BASOS ABS: 0.1 10*3/uL (ref 0.0–0.1)
EOS%: 6.6 % (ref 0.0–7.0)
Eosinophils Absolute: 0.4 10*3/uL (ref 0.0–0.5)
HCT: 31.7 % — ABNORMAL LOW (ref 34.8–46.6)
HGB: 9.8 g/dL — ABNORMAL LOW (ref 11.6–15.9)
LYMPH%: 17.9 % (ref 14.0–49.7)
MCH: 28.3 pg (ref 25.1–34.0)
MCHC: 30.9 g/dL — ABNORMAL LOW (ref 31.5–36.0)
MCV: 91.6 fL (ref 79.5–101.0)
MONO#: 0.5 10*3/uL (ref 0.1–0.9)
MONO%: 8.1 % (ref 0.0–14.0)
NEUT%: 66.6 % (ref 38.4–76.8)
NEUTROS ABS: 3.9 10*3/uL (ref 1.5–6.5)
NRBC: 0 % (ref 0–0)
Platelets: 251 10*3/uL (ref 145–400)
RBC: 3.46 10*6/uL — AB (ref 3.70–5.45)
RDW: 18.3 % — AB (ref 11.2–14.5)
WBC: 5.9 10*3/uL (ref 3.9–10.3)
lymph#: 1.1 10*3/uL (ref 0.9–3.3)

## 2017-06-20 LAB — TSH: TSH: 1.671 m[IU]/L (ref 0.308–3.960)

## 2017-06-20 MED ORDER — DARBEPOETIN ALFA 100 MCG/0.5ML IJ SOSY
100.0000 ug | PREFILLED_SYRINGE | Freq: Once | INTRAMUSCULAR | Status: AC
  Administered 2017-06-20: 100 ug via SUBCUTANEOUS
  Filled 2017-06-20: qty 0.5

## 2017-06-20 NOTE — Telephone Encounter (Signed)
-----   Message from Heath Lark, MD sent at 06/20/2017  3:55 PM EDT ----- Regarding: TSH pls let her know TSH is OK ----- Message ----- From: Interface, Lab In Three Zero One Sent: 06/20/2017   2:33 PM To: Heath Lark, MD

## 2017-06-20 NOTE — Patient Instructions (Signed)

## 2017-06-20 NOTE — Telephone Encounter (Signed)
Notified of results below 

## 2017-06-25 ENCOUNTER — Encounter: Payer: Self-pay | Admitting: Family Medicine

## 2017-06-25 ENCOUNTER — Telehealth: Payer: Self-pay | Admitting: Family Medicine

## 2017-06-25 NOTE — Telephone Encounter (Signed)
Daughter called because letter from dr Wendy Poet stated pt was taking 75 mg synthroid. Dr McDiarmid raised it to 85 in June. Just wanted him to be aware

## 2017-06-27 ENCOUNTER — Telehealth: Payer: Self-pay | Admitting: *Deleted

## 2017-06-27 DIAGNOSIS — L03115 Cellulitis of right lower limb: Secondary | ICD-10-CM

## 2017-06-27 NOTE — Telephone Encounter (Signed)
Pt daughter calling in stating that her leg is red and swollen. Responding well to ice. Did offer an appt but she wanted to talk to you first. Please advise. Deseree Kennon Holter, CMA

## 2017-06-28 DIAGNOSIS — L03115 Cellulitis of right lower limb: Secondary | ICD-10-CM | POA: Insufficient documentation

## 2017-06-28 MED ORDER — CLINDAMYCIN HCL 300 MG PO CAPS: 300.0000 mg | ORAL_CAPSULE | Freq: Four times a day (QID) | ORAL | 0 refills | Status: DC

## 2017-06-28 NOTE — Telephone Encounter (Signed)
Talked with dgt, [name] by phone  Right leg shin redness, nontender, no fever, no pain, no leg swelling Onset 2 days ago No skin breaks obvious. No sites of skin bulging or softness to touch Pt with anaphylactic rxn to PCN, Qunilonlone, Sulfonamide. May be stable over last 24 hours Using ice intermittent to site.  Eating well.  No change in thinking, no change in behavior.  Transport to [employer] office is considerably taxing to patient 1/2 red swollen  A/ Possible Cellulitis, right leg - other possibility is a noninfectious dermatitis (contact, mechanical, stasis); superficial phlebitis, other - Empiric trial of Clindamycin 500 mg PO QID x 7 days - If skin site redness, pain develop and weekend, then eval at Urgent care  - If stable but not imprving over weekend, then bring in for SDA on Monday 8/6 for evaluation - Continue ice, elevation

## 2017-06-28 NOTE — Assessment & Plan Note (Signed)
  See telephone note for 06/28/17 from Dr Manus Weedman

## 2017-06-28 NOTE — Telephone Encounter (Signed)
[name] called back and would like to know if it was possible for you to see pt today? [name] is concerned about pt going through the weekend. ep

## 2017-06-28 NOTE — Telephone Encounter (Signed)
Unfortunately, I am attending in hospital.  She will have to be seen by someone else.

## 2017-06-28 NOTE — Telephone Encounter (Signed)
[name] contacted and informed of pcp out of office and on inpatient service. [name] informed we have been busy today and at this time do not have an opening for patient. [name] advised to take patient to urgent care if she feels this is best. [name] did not answer, all above was left on her VM.

## 2017-07-01 ENCOUNTER — Telehealth: Payer: Self-pay | Admitting: Family Medicine

## 2017-07-01 ENCOUNTER — Ambulatory Visit (INDEPENDENT_AMBULATORY_CARE_PROVIDER_SITE_OTHER): Payer: Medicare Other | Admitting: Internal Medicine

## 2017-07-01 ENCOUNTER — Encounter: Payer: Self-pay | Admitting: Internal Medicine

## 2017-07-01 VITALS — BP 120/62 | HR 54 | Temp 98.0°F | Ht 66.0 in | Wt 143.0 lb

## 2017-07-01 DIAGNOSIS — M7989 Other specified soft tissue disorders: Secondary | ICD-10-CM | POA: Diagnosis not present

## 2017-07-01 NOTE — Patient Instructions (Addendum)
Ms. Rautio,  I am glad the swelling and redness has improved some. This may be worsening venous stasis rather than infection. However, since you started the antibiotics and have seen some improvement, please complete 7 day course of clindamycin.   Take lasix 20 mg over the next few days. Your weight is up to 143, whereas you normally are around 135. Please get daily weights. Continue increased lasix until about 5 lbs are off. Then resume 10 mg daily or every other day as needed. Dr. McDiarmid and I are also available to discuss how you are doing over the phone over the next few days.   I will call with lab results tomorrow.  Best, Dr. Ola Spurr

## 2017-07-01 NOTE — Telephone Encounter (Signed)
Will forward to MD to make him aware. Jazmin Hartsell,CMA  

## 2017-07-01 NOTE — Progress Notes (Signed)
[facility] Progress Note  Subjective:  Gina Wilkinson is a 83 y.o. female with history of CHF (EF 65-70%), afib, severe AS, CKD, chronic respiratory failure, and anemia who presents for leg swelling.  #LE edema: - Patient's daughter noticed increased swelling of R lower leg last Thursday  - Started taking clindamycin Friday as instructed by Dr. Wendy Poet and has had some improvement in redness and swelling - Daughter also increased lasix to 10 mg daily, whereas she had been giving this every other day in June after being informed about worsening SCr - Area of erythema has stayed the same, per daughter ROS: No fevers, decreased appetite, n/v/d. No increased SOB. No leg pain.   Social: Never smoker  Allergies  Allergen Reactions  . Baclofen Other (See Comments)    Severe altered mental status, extreme sedation   . Levaquin [Levofloxacin] Anaphylaxis  . Penicillins Anaphylaxis    Has patient had a PCN reaction causing immediate rash, facial/tongue/throat swelling, SOB or lightheadedness with hypotension: Yes Has patient had a PCN reaction causing severe rash involving mucus membranes or skin necrosis: No Has patient had a PCN reaction that required hospitalization No Has patient had a PCN reaction occurring within the last 10 years: No If all of the above answers are "NO", then may proceed with Cephalosporin use.   . Sulfonamide Derivatives Anaphylaxis    Objective: Blood pressure 120/62, pulse (!) 54, temperature 98 F (36.7 C), temperature source Oral, height 5\' 6"  (1.676 m), weight 143 lb (64.9 kg), SpO2 (!) 87 % (exertional--on home 2L O2).  Constitutional: Elderly female, well-kempt in NAD HENT: MMM Cardiovascular: Loud SEM heard best over RUSB Pulmonary/Chest: Effort normal and breath sounds normal. No respiratory distress. Speaking in complete sentences with ease.   Musculoskeletal: 2+ LE to just below knees bilaterally, though R leg > L and with increased  erythema.  Neurological: AOx3 Skin: Bilateral erythema of LEs with slightly increased warmth.  Psychiatric: Normal mood and affect.  Vitals reviewed  Assessment/Plan: Leg swelling - Improving LE swelling. Differential includes worsening venous stasis with dependent edema on decreased dose of lasix, worsening HF, and cellulitis. Patient is afebrile and well appearing. Denies increased SOB and lungs are CTAB. Precepted with patient's PCP Dr. McDiarmid.  - Complete course of clindamycin as may have contributed to improvement in symptoms. - Increase lasix to 20 mg daily, as weight is up 8 lbs from baseline of 135, until patient is down about 5 lbs, then resume 10 mg dosing. - Will obtain CBC to look for leukocytosis and worsening anemia. - Will obtain BMP to get current SCr level. - Plan to repeat BMP next week. Daughters to call Dr. McDiarmid to discuss ongoing plan based to response to increased dose of lasix.   Follow-up by end of week about weight.  Olene Floss, MD [place], PGY-3

## 2017-07-01 NOTE — Telephone Encounter (Signed)
Daughter is calling because she said that her mother's leg is still swollen. She is bringing her mother in today at 4:30 to be seen with Dr. Ola Spurr and she would like to see if Dr. McDiarmid could look in on her mother if he has time. jw

## 2017-07-02 DIAGNOSIS — M7989 Other specified soft tissue disorders: Secondary | ICD-10-CM | POA: Insufficient documentation

## 2017-07-02 LAB — CBC
HEMATOCRIT: 31.5 % — AB (ref 34.0–46.6)
Hemoglobin: 9.9 g/dL — ABNORMAL LOW (ref 11.1–15.9)
MCH: 27.9 pg (ref 26.6–33.0)
MCHC: 31.4 g/dL — ABNORMAL LOW (ref 31.5–35.7)
MCV: 89 fL (ref 79–97)
PLATELETS: 292 10*3/uL (ref 150–379)
RBC: 3.55 x10E6/uL — AB (ref 3.77–5.28)
RDW: 18.3 % — ABNORMAL HIGH (ref 12.3–15.4)
WBC: 6 10*3/uL (ref 3.4–10.8)

## 2017-07-02 LAB — BASIC METABOLIC PANEL
BUN / CREAT RATIO: 17 (ref 12–28)
BUN: 26 mg/dL (ref 10–36)
CO2: 24 mmol/L (ref 20–29)
Calcium: 9.8 mg/dL (ref 8.7–10.3)
Chloride: 99 mmol/L (ref 96–106)
Creatinine, Ser: 1.55 mg/dL — ABNORMAL HIGH (ref 0.57–1.00)
GFR calc Af Amer: 33 mL/min/{1.73_m2} — ABNORMAL LOW (ref >59)
GFR, EST NON AFRICAN AMERICAN: 28 mL/min/{1.73_m2} — AB (ref >59)
Glucose: 121 mg/dL — ABNORMAL HIGH (ref 65–99)
Potassium: 5.1 mmol/L (ref 3.5–5.2)
Sodium: 138 mmol/L (ref 134–144)

## 2017-07-02 NOTE — Assessment & Plan Note (Signed)
-   Improving LE swelling. Differential includes worsening venous stasis with dependent edema on decreased dose of lasix, worsening HF, and cellulitis. Patient is afebrile and well appearing. Denies increased SOB and lungs are CTAB. Precepted with patient's PCP Dr. McDiarmid.  - Complete course of clindamycin as may have contributed to improvement in symptoms. - Increase lasix to 20 mg daily, as weight is up 8 lbs from baseline of 135, until patient is down about 5 lbs, then resume 10 mg dosing. - Will obtain CBC to look for leukocytosis and worsening anemia. - Will obtain BMP to get current SCr level. - Plan to repeat BMP next week. Daughters to call Dr. McDiarmid to discuss ongoing plan based to response to increased dose of lasix.

## 2017-07-03 NOTE — Progress Notes (Signed)
I have interviewed and examined the patient with Dr Ola Spurr.  I agree with their assessments and plans as documented in her visit note.

## 2017-07-08 ENCOUNTER — Telehealth: Payer: Self-pay | Admitting: Family Medicine

## 2017-07-08 NOTE — Telephone Encounter (Signed)
Gina Wilkinson reports pt has lost 2 pounds. Wants to if Lasix needs to be increased or stay the same. Please advise. ep

## 2017-07-08 NOTE — Telephone Encounter (Signed)
Returned call to [name] for additional info.  Left message to call our office back.  Burna Forts, BSN, RN-BC

## 2017-07-08 NOTE — Telephone Encounter (Signed)
I spoke with Ms Linus Mako about her mother.  She is doing well.    Her blood pressure have been good.   Her weight is down 2 pounds over a week with the increase of Lasix from 10 mg daily to 20 mg daily. Slight decrease in bilateral leg edema.   If she is not doing well, Ms Linus Mako plans to bring her to [facility], o/w she will wait until visit with Dr Alvy Bimler on 08/09/17 for lab draws.  Ms Linus Mako requests that the BMET be drawn with labs Dr Alvy Bimler draws on 9/14.  A/ Peripheral leg edema, bilateral, congestive.  - slow improvement with gentle diuresis Lasix 20 mg daily.   P/ - Continue Lasix 20 mg daily with goal of weight 135 to 140 pounds and leg edema significantly reduced without hypotension of renal/electrolyte derangement.  - Ms Linus Mako will call in with patient's weight in a week. - Will ask to add BMET to blood draw at Dr Alvy Bimler office on 9/141/8.  - If Mrs Malmquist's weight starts to rise or fails to continue slow decline, her dgt will bring her into [facility] for evaluation.

## 2017-07-08 NOTE — Telephone Encounter (Signed)
Daughter calling back to report that patient's right leg is still swollen.  Currently on Lasix 20 mg daily and has lost 2 lbs.  Has been drinking more water since being on clindamycin.  Is up every night urinating.  Wants to know if patient should continue with current dosage until she loses 5 lbs.  Will route note to Dr. McDiarmid for advice and call daughter back.  Burna Forts, BSN, RN-BC

## 2017-07-15 ENCOUNTER — Telehealth: Payer: Self-pay | Admitting: *Deleted

## 2017-07-15 NOTE — Telephone Encounter (Signed)
Patient's daughter [name] called stating patient lost the 5 lb.  She is requesting PCP give her a call to discuss the continuation of Lasix 20 mg or decrease back down to 10 mg. Please call [phone].  Derl Barrow, RN

## 2017-07-16 NOTE — Telephone Encounter (Signed)
Patient's daughter called again regarding decreasing lasix. Please give her a call back.  Derl Barrow, RN

## 2017-07-17 NOTE — Addendum Note (Signed)
Addended byWendy Poet, Hazim Treadway D on: 07/17/2017 12:58 PM   Modules accepted: Orders

## 2017-07-17 NOTE — Telephone Encounter (Signed)
I spoke with Ms Gina Wilkinson. Her mothers weight is down to 139 lbs, Her legs look better, Her right leg continues to be larger than her left.  No leg pain. Will alternate Lasix 20 mg and 10 mg QOD.  Ms Gina Wilkinson has been increasing potassium rich food in her mother's diet.  Ms Gina Wilkinson will contact us next week to let us know how Ms Gina Wilkinson is doing.

## 2017-08-05 ENCOUNTER — Telehealth: Payer: Self-pay | Admitting: *Deleted

## 2017-08-05 ENCOUNTER — Other Ambulatory Visit: Payer: Self-pay | Admitting: *Deleted

## 2017-08-05 DIAGNOSIS — N184 Chronic kidney disease, stage 4 (severe): Principal | ICD-10-CM

## 2017-08-05 DIAGNOSIS — D469 Myelodysplastic syndrome, unspecified: Secondary | ICD-10-CM

## 2017-08-05 DIAGNOSIS — Z79899 Other long term (current) drug therapy: Secondary | ICD-10-CM

## 2017-08-05 DIAGNOSIS — D631 Anemia in chronic kidney disease: Secondary | ICD-10-CM

## 2017-08-05 MED ORDER — AMLODIPINE BESYLATE 5 MG PO TABS: ORAL_TABLET | ORAL | 3 refills | Status: AC

## 2017-08-05 NOTE — Telephone Encounter (Signed)
Patient's daughter left voice message on nurse line requesting a call from PCP regarding Lasix and also has another question to touch bases with PCP.  Derl Barrow, RN

## 2017-08-06 NOTE — Telephone Encounter (Signed)
LM for daughter ok per DPR in chart. Lubertha Leite,CMA

## 2017-08-06 NOTE — Telephone Encounter (Signed)
I spoke with Gina Gina Wilkinson Stable leg edema at an improved degree.  Gina Gina Wilkinson requesting lab work for visit to Dr Alvy Bimler visit9/13 And request letter stating she is the primary care taker for Gina Wilkinson to give to court.

## 2017-08-06 NOTE — Addendum Note (Signed)
Addended byWendy Poet, Yuvraj Pfeifer D on: 08/06/2017 02:15 PM   Modules accepted: Orders

## 2017-08-06 NOTE — Telephone Encounter (Signed)
Please contact Ms Gina Hews, Ms Gina Wilkinson's daughter; to pick up Gina Wilkinson letter at [facility] front desk.

## 2017-08-08 ENCOUNTER — Other Ambulatory Visit: Payer: Self-pay | Admitting: Hematology and Oncology

## 2017-08-08 ENCOUNTER — Telehealth: Payer: Self-pay

## 2017-08-08 ENCOUNTER — Ambulatory Visit (HOSPITAL_BASED_OUTPATIENT_CLINIC_OR_DEPARTMENT_OTHER): Payer: Medicare Other

## 2017-08-08 ENCOUNTER — Ambulatory Visit (HOSPITAL_BASED_OUTPATIENT_CLINIC_OR_DEPARTMENT_OTHER): Payer: Medicare Other | Admitting: Hematology and Oncology

## 2017-08-08 ENCOUNTER — Encounter: Payer: Self-pay | Admitting: Hematology and Oncology

## 2017-08-08 ENCOUNTER — Ambulatory Visit: Payer: Self-pay

## 2017-08-08 ENCOUNTER — Telehealth: Payer: Self-pay | Admitting: Hematology and Oncology

## 2017-08-08 DIAGNOSIS — D469 Myelodysplastic syndrome, unspecified: Secondary | ICD-10-CM

## 2017-08-08 DIAGNOSIS — D631 Anemia in chronic kidney disease: Secondary | ICD-10-CM

## 2017-08-08 DIAGNOSIS — N184 Chronic kidney disease, stage 4 (severe): Secondary | ICD-10-CM

## 2017-08-08 DIAGNOSIS — I5032 Chronic diastolic (congestive) heart failure: Secondary | ICD-10-CM

## 2017-08-08 DIAGNOSIS — N179 Acute kidney failure, unspecified: Secondary | ICD-10-CM

## 2017-08-08 LAB — CBC WITH DIFFERENTIAL/PLATELET
BASO%: 1.1 % (ref 0.0–2.0)
BASOS ABS: 0.1 10*3/uL (ref 0.0–0.1)
EOS ABS: 0.2 10*3/uL (ref 0.0–0.5)
EOS%: 3.6 % (ref 0.0–7.0)
HEMATOCRIT: 32.5 % — AB (ref 34.8–46.6)
HGB: 10.6 g/dL — ABNORMAL LOW (ref 11.6–15.9)
LYMPH#: 0.7 10*3/uL — AB (ref 0.9–3.3)
LYMPH%: 12 % — AB (ref 14.0–49.7)
MCH: 28.7 pg (ref 25.1–34.0)
MCHC: 32.5 g/dL (ref 31.5–36.0)
MCV: 88.2 fL (ref 79.5–101.0)
MONO#: 0.6 10*3/uL (ref 0.1–0.9)
MONO%: 9 % (ref 0.0–14.0)
NEUT#: 4.6 10*3/uL (ref 1.5–6.5)
NEUT%: 74.3 % (ref 38.4–76.8)
PLATELETS: 224 10*3/uL (ref 145–400)
RBC: 3.69 10*6/uL — ABNORMAL LOW (ref 3.70–5.45)
RDW: 18.7 % — ABNORMAL HIGH (ref 11.2–14.5)
WBC: 6.1 10*3/uL (ref 3.9–10.3)

## 2017-08-08 LAB — BASIC METABOLIC PANEL
ANION GAP: 11 meq/L (ref 3–11)
BUN: 30.1 mg/dL — AB (ref 7.0–26.0)
CO2: 28 mEq/L (ref 22–29)
CREATININE: 1.8 mg/dL — AB (ref 0.6–1.1)
Calcium: 10.2 mg/dL (ref 8.4–10.4)
Chloride: 102 mEq/L (ref 98–109)
EGFR: 24 mL/min/{1.73_m2} — ABNORMAL LOW (ref >90)
GLUCOSE: 138 mg/dL (ref 70–140)
POTASSIUM: 4.3 meq/L (ref 3.5–5.1)
Sodium: 140 mEq/L (ref 136–145)

## 2017-08-08 NOTE — Telephone Encounter (Signed)
S/w [name] and she is aware of appt change.  High priority inbasket sent to move MD and injection since lab is already at 2 pm

## 2017-08-08 NOTE — Assessment & Plan Note (Signed)
She carried a diagnosis of myelodysplastic syndrome in the past based on a bone marrow biopsy in 2010. She had been receiving darbepoetin for a while  Continue close observation.

## 2017-08-08 NOTE — Progress Notes (Signed)
[place] OFFICE PROGRESS NOTE  Patient Care Team: McDiarmid, Blane Ohara, MD as PCP - General (Family Medicine) Evans Lance, MD as Consulting Physician (Cardiology) Heath Lark, MD as Consulting Physician (Hematology and Oncology)  SUMMARY OF ONCOLOGIC HISTORY:  This pleasant lady was diagnosed with a low-grade myelodysplastic syndrome characterized by isolated normochromic anemia.  Bone marrow done 02/01/2009 was hypercellular for age. Mild dyserythropoiesis. 3% blasts. Normal cytogenetics. No ring sideroblasts. Hemoglobin 9.8 at that time with platelet count 367,000. Ferritin 329. Erythropoietin 69.9. She has had a durable response to intermittent erythropoietin injections currently on Aranesp 300 mcg every 4 weeks. In December 2014, the patient had hip fracture requiring surgery and blood transfusion. In May 2015, we increased Darbopoetin to 500 mcg every 4 weeks. The last dose of injection was November 2015 From 01/14/2017 July 2018 she received darbepoetin injection again due to anemia.  INTERVAL HISTORY: Please see below for problem oriented charting. She feels well. Her energy level is stable. She is here accompanied by the daughter and had oxygen therapy Recently, the dose of Lasix was mildly increased due to evidence of leg swelling and congestive heart failure She denies chest pain or shortness of breath right now She denies recent infection The patient denies any recent signs or symptoms of bleeding such as spontaneous epistaxis, hematuria or hematochezia.  REVIEW OF SYSTEMS:   Constitutional: Denies fevers, chills or abnormal weight loss Eyes: Denies blurriness of vision Ears, nose, mouth, throat, and face: Denies mucositis or sore throat Respiratory: Denies cough, dyspnea or wheezes Cardiovascular: Denies palpitation, chest discomfort Gastrointestinal:  Denies nausea, heartburn or change in bowel habits Skin: Denies abnormal skin rashes Lymphatics: Denies  new lymphadenopathy or easy bruising Neurological:Denies numbness, tingling or new weaknesses Behavioral/Psych: Mood is stable, no new changes  All other systems were reviewed with the patient and are negative.  I have reviewed the past medical history, past surgical history, social history and family history with the patient and they are unchanged from previous note.  ALLERGIES:  is allergic to baclofen; levaquin [levofloxacin]; penicillins; and sulfonamide derivatives.  MEDICATIONS:  Current Outpatient Prescriptions  Medication Sig Dispense Refill  . amLODipine (NORVASC) 5 MG tablet TAKE 1 TABLET (5 MG TOTAL) BY MOUTH DAILY. 90 tablet 3  . aspirin 81 MG tablet Take 81 mg by mouth daily.    . Cholecalciferol (VITAMIN D PO) Take 5,000 Units by mouth daily.    . Cyanocobalamin (VITAMIN B-12 PO) Take 3,000 mg by mouth daily.    . Darbepoetin Alfa-Albumin (ARANESP IJ) Inject as directed every 14 (fourteen) days.    . furosemide (LASIX) 20 MG tablet 20 mg alternating with 10 mg QOD 30 tablet 3  . levothyroxine (SYNTHROID) 88 MCG tablet Take 1 tablet (88 mcg total) by mouth daily. Take on empty stomach. 90 tablet 1  . OXYGEN Inhale 2 L/min into the lungs. Use oxygen supplementation at least 16 hours per day     No current facility-administered medications for this visit.     PHYSICAL EXAMINATION: ECOG PERFORMANCE STATUS: 2 - Symptomatic, <50% confined to bed  Vitals:   08/08/17 1502  BP: 114/65  Pulse: 87  Resp: (!) 24  Temp: 97.9 F (36.6 C)  SpO2: 94%   Filed Weights   08/08/17 1502  Weight: 137 lb (62.1 kg)    GENERAL:alert, no distress and comfortable.  She looks frail.  She has oxygen delivered via nasal cannula SKIN: skin color, texture, turgor are normal, no rashes or  significant lesions EYES: normal, Conjunctiva are pink and non-injected, sclera clear OROPHARYNX:no exudate, no erythema and lips, buccal mucosa, and tongue normal  NECK: supple, thyroid normal size,  non-tender, without nodularity LYMPH:  no palpable lymphadenopathy in the cervical, axillary or inguinal LUNGS: clear to auscultation and percussion with normal breathing effort HEART: regular rate & rhythm with loud systolic murmur, and no lower extremity edema ABDOMEN:abdomen soft, non-tender and normal bowel sounds Musculoskeletal:no cyanosis of digits and no clubbing  NEURO: alert & oriented x 3 with fluent speech, no focal motor/sensory deficits  LABORATORY DATA:  I have reviewed the data as listed    Component Value Date/Time   NA 140 08/08/2017 1431   K 4.3 08/08/2017 1431   CL 99 07/01/2017 1713   CL 105 07/25/2012 1005   CO2 28 08/08/2017 1431   GLUCOSE 138 08/08/2017 1431   GLUCOSE 142 (H) 07/25/2012 1005   BUN 30.1 (H) 08/08/2017 1431   CREATININE 1.8 (H) 08/08/2017 1431   CALCIUM 10.2 08/08/2017 1431   PROT CANCELED 04/25/2017 1011   PROT 7.7 12/14/2014 1508   ALBUMIN CANCELED 04/25/2017 1011   ALBUMIN 4.0 12/14/2014 1508   AST CANCELED 04/25/2017 1011   AST 15 12/14/2014 1508   ALT CANCELED 04/25/2017 1011   ALT 8 12/14/2014 1508   ALKPHOS CANCELED 04/25/2017 1011   ALKPHOS 78 12/14/2014 1508   BILITOT CANCELED 04/25/2017 1011   BILITOT 0.72 12/14/2014 1508   GFRNONAA 28 (L) 07/01/2017 1713   GFRNONAA 28 (L) 10/03/2016 0957   GFRAA 33 (L) 07/01/2017 1713   GFRAA 33 (L) 10/03/2016 0957    No results found for: SPEP, UPEP  Lab Results  Component Value Date   WBC 6.1 08/08/2017   NEUTROABS 4.6 08/08/2017   HGB 10.6 (L) 08/08/2017   HCT 32.5 (L) 08/08/2017   MCV 88.2 08/08/2017   PLT 224 08/08/2017      Chemistry      Component Value Date/Time   NA 140 08/08/2017 1431   K 4.3 08/08/2017 1431   CL 99 07/01/2017 1713   CL 105 07/25/2012 1005   CO2 28 08/08/2017 1431   BUN 30.1 (H) 08/08/2017 1431   CREATININE 1.8 (H) 08/08/2017 1431      Component Value Date/Time   CALCIUM 10.2 08/08/2017 1431   ALKPHOS CANCELED 04/25/2017 1011   ALKPHOS 78  12/14/2014 1508   AST CANCELED 04/25/2017 1011   AST 15 12/14/2014 1508   ALT CANCELED 04/25/2017 1011   ALT 8 12/14/2014 1508   BILITOT CANCELED 04/25/2017 1011   BILITOT 0.72 12/14/2014 1508      ASSESSMENT & PLAN:  MDS (myelodysplastic syndrome) ([place]) She carried a diagnosis of myelodysplastic syndrome in the past based on a bone marrow biopsy in 2010. She had been receiving darbepoetin for a while  Continue close observation.  Anemia of chronic renal failure, stage 4 (severe) (HCC) She has acute on chronic renal failure. She continues on current medical management. I recommend close follow-up blood monitoring. She will continue to get darbepoetin injection to keep hemoglobin greater than 10  Chronic diastolic heart failure ([place]) The patient had heart murmur and evidence of recent congestive heart failure She is on oxygen therapy She will continue medical management She was recently placed on higher dose of Lasix causing mild renal failure I would defer to her primary care doctor for medical management Examination today satisfactory with no signs of congestive heart failure   No orders of the defined types were  placed in this encounter.  All questions were answered. The patient knows to call the clinic with any problems, questions or concerns. No barriers to learning was detected. I spent 15 minutes counseling the patient face to face. The total time spent in the appointment was 20 minutes and more than 50% was on counseling and review of test results     Heath Lark, MD 08/08/2017 3:27 PM

## 2017-08-08 NOTE — Telephone Encounter (Signed)
Gina Wilkinson called to get lab appt changed from 9/13 to 9/12. This was done.  She will keep Dr Alvy Bimler and injection on 9/13. She is asking if we can draw labs for Dr Mcdermid. He has ordered CBC and BMET. Is it OK to add BMET today?

## 2017-08-08 NOTE — Telephone Encounter (Signed)
Scheduled appt per 9/13 los - Gave patient AVS and calender per los.  

## 2017-08-08 NOTE — Assessment & Plan Note (Signed)
The patient had heart murmur and evidence of recent congestive heart failure She is on oxygen therapy She will continue medical management She was recently placed on higher dose of Lasix causing mild renal failure I would defer to her primary care doctor for medical management Examination today satisfactory with no signs of congestive heart failure

## 2017-08-08 NOTE — Assessment & Plan Note (Signed)
She has acute on chronic renal failure. She continues on current medical management. I recommend close follow-up blood monitoring. She will continue to get darbepoetin injection to keep hemoglobin greater than 10

## 2017-08-08 NOTE — Telephone Encounter (Signed)
I will see her today. No need for her to come twice Pls place urgent scheduling msg to add on after labs and add inj appt today! I will order BMET

## 2017-08-09 ENCOUNTER — Ambulatory Visit: Payer: Self-pay | Admitting: Hematology and Oncology

## 2017-08-09 ENCOUNTER — Other Ambulatory Visit: Payer: Self-pay

## 2017-08-09 ENCOUNTER — Ambulatory Visit: Payer: Self-pay

## 2017-08-12 ENCOUNTER — Telehealth: Payer: Self-pay | Admitting: *Deleted

## 2017-08-12 NOTE — Telephone Encounter (Signed)
[name] left message on nurse liine requesting return call from PCP to discuss patient's recent lab results from Dr. Alvy Bimler as well as lasix. She may be reached at [phone]. Hubbard Hartshorn, RN, BSN

## 2017-08-13 ENCOUNTER — Other Ambulatory Visit: Payer: Self-pay | Admitting: Family Medicine

## 2017-08-13 DIAGNOSIS — N184 Chronic kidney disease, stage 4 (severe): Secondary | ICD-10-CM

## 2017-08-13 NOTE — Progress Notes (Signed)
Renal panel to follow up loop diuretic therapy in setting of CKD IV and diastolic heart failure.

## 2017-08-13 NOTE — Telephone Encounter (Signed)
I spoke with Mrs Linus Mako about her mother's BMET from 9/13, particularly the rise in Creatinine from 1.5 to 1.8 on three times a week Lasix 20 mg with 10 mg all other days.   Ms Hidalgo's wt at home is currently 136 lbs.  This is down from 144 lbs when she had significant leg edema.  Leg edema is resolved.  She does not appear dehydrated per dgt.   A/ Euvolemic state in setting of diastolic ventricular dysfunction.       Mild rise in SCr in setting of CKD IV  P/ Decrease furosemide back to 10 mg daily.  Mrs Linus Mako to dose 20 mg a day     as needed for increase in leg edema or for increase in weight 3 pounds.      RTC in 2 to 3 months.      Lab order for BMET on ov with Dr Alvy Bimler on 10/5

## 2017-08-15 ENCOUNTER — Encounter (HOSPITAL_COMMUNITY): Payer: Self-pay | Admitting: Emergency Medicine

## 2017-08-15 ENCOUNTER — Inpatient Hospital Stay (HOSPITAL_COMMUNITY)
Admission: EM | Admit: 2017-08-15 | Discharge: 2017-08-20 | DRG: 291 | Disposition: A | Discharge status: Home Health | Payer: Medicare Other | Attending: Family Medicine | Admitting: Family Medicine

## 2017-08-15 ENCOUNTER — Telehealth: Payer: Self-pay

## 2017-08-15 ENCOUNTER — Emergency Department (HOSPITAL_COMMUNITY): Payer: Medicare Other

## 2017-08-15 DIAGNOSIS — D631 Anemia in chronic kidney disease: Secondary | ICD-10-CM | POA: Diagnosis not present

## 2017-08-15 DIAGNOSIS — E039 Hypothyroidism, unspecified: Secondary | ICD-10-CM | POA: Diagnosis present

## 2017-08-15 DIAGNOSIS — J9621 Acute and chronic respiratory failure with hypoxia: Secondary | ICD-10-CM | POA: Diagnosis present

## 2017-08-15 DIAGNOSIS — R0603 Acute respiratory distress: Secondary | ICD-10-CM | POA: Diagnosis not present

## 2017-08-15 DIAGNOSIS — I130 Hypertensive heart and chronic kidney disease with heart failure and stage 1 through stage 4 chronic kidney disease, or unspecified chronic kidney disease: Principal | ICD-10-CM | POA: Diagnosis present

## 2017-08-15 DIAGNOSIS — Z888 Allergy status to other drugs, medicaments and biological substances status: Secondary | ICD-10-CM

## 2017-08-15 DIAGNOSIS — Z882 Allergy status to sulfonamides status: Secondary | ICD-10-CM

## 2017-08-15 DIAGNOSIS — D469 Myelodysplastic syndrome, unspecified: Secondary | ICD-10-CM | POA: Diagnosis present

## 2017-08-15 DIAGNOSIS — I5043 Acute on chronic combined systolic (congestive) and diastolic (congestive) heart failure: Secondary | ICD-10-CM | POA: Diagnosis present

## 2017-08-15 DIAGNOSIS — Z66 Do not resuscitate: Secondary | ICD-10-CM | POA: Diagnosis not present

## 2017-08-15 DIAGNOSIS — I4821 Permanent atrial fibrillation: Secondary | ICD-10-CM | POA: Diagnosis present

## 2017-08-15 DIAGNOSIS — I4510 Unspecified right bundle-branch block: Secondary | ICD-10-CM | POA: Diagnosis present

## 2017-08-15 DIAGNOSIS — I5031 Acute diastolic (congestive) heart failure: Secondary | ICD-10-CM

## 2017-08-15 DIAGNOSIS — I080 Rheumatic disorders of both mitral and aortic valves: Secondary | ICD-10-CM | POA: Diagnosis not present

## 2017-08-15 DIAGNOSIS — Z881 Allergy status to other antibiotic agents status: Secondary | ICD-10-CM

## 2017-08-15 DIAGNOSIS — Z515 Encounter for palliative care: Secondary | ICD-10-CM | POA: Diagnosis not present

## 2017-08-15 DIAGNOSIS — I352 Nonrheumatic aortic (valve) stenosis with insufficiency: Secondary | ICD-10-CM | POA: Diagnosis not present

## 2017-08-15 DIAGNOSIS — E46 Unspecified protein-calorie malnutrition: Secondary | ICD-10-CM | POA: Diagnosis present

## 2017-08-15 DIAGNOSIS — Z7982 Long term (current) use of aspirin: Secondary | ICD-10-CM

## 2017-08-15 DIAGNOSIS — I248 Other forms of acute ischemic heart disease: Secondary | ICD-10-CM | POA: Diagnosis not present

## 2017-08-15 DIAGNOSIS — Z880 Allergy status to penicillin: Secondary | ICD-10-CM | POA: Diagnosis not present

## 2017-08-15 DIAGNOSIS — R0600 Dyspnea, unspecified: Secondary | ICD-10-CM | POA: Diagnosis present

## 2017-08-15 DIAGNOSIS — R001 Bradycardia, unspecified: Secondary | ICD-10-CM | POA: Diagnosis present

## 2017-08-15 DIAGNOSIS — R0602 Shortness of breath: Secondary | ICD-10-CM | POA: Diagnosis not present

## 2017-08-15 DIAGNOSIS — I2721 Secondary pulmonary arterial hypertension: Secondary | ICD-10-CM | POA: Diagnosis present

## 2017-08-15 DIAGNOSIS — Z9981 Dependence on supplemental oxygen: Secondary | ICD-10-CM

## 2017-08-15 DIAGNOSIS — R52 Pain, unspecified: Secondary | ICD-10-CM

## 2017-08-15 DIAGNOSIS — I340 Nonrheumatic mitral (valve) insufficiency: Secondary | ICD-10-CM | POA: Insufficient documentation

## 2017-08-15 DIAGNOSIS — Z6821 Body mass index (BMI) 21.0-21.9, adult: Secondary | ICD-10-CM

## 2017-08-15 DIAGNOSIS — I350 Nonrheumatic aortic (valve) stenosis: Secondary | ICD-10-CM | POA: Diagnosis not present

## 2017-08-15 DIAGNOSIS — I482 Chronic atrial fibrillation: Secondary | ICD-10-CM | POA: Diagnosis present

## 2017-08-15 DIAGNOSIS — N184 Chronic kidney disease, stage 4 (severe): Secondary | ICD-10-CM | POA: Diagnosis not present

## 2017-08-15 DIAGNOSIS — F0390 Unspecified dementia without behavioral disturbance: Secondary | ICD-10-CM | POA: Diagnosis not present

## 2017-08-15 DIAGNOSIS — R0902 Hypoxemia: Secondary | ICD-10-CM | POA: Diagnosis not present

## 2017-08-15 DIAGNOSIS — I509 Heart failure, unspecified: Secondary | ICD-10-CM | POA: Insufficient documentation

## 2017-08-15 DIAGNOSIS — I050 Rheumatic mitral stenosis: Secondary | ICD-10-CM | POA: Diagnosis not present

## 2017-08-15 DIAGNOSIS — N179 Acute kidney failure, unspecified: Secondary | ICD-10-CM | POA: Insufficient documentation

## 2017-08-15 DIAGNOSIS — I361 Nonrheumatic tricuspid (valve) insufficiency: Secondary | ICD-10-CM | POA: Diagnosis not present

## 2017-08-15 LAB — CBC WITH DIFFERENTIAL/PLATELET
Basophils Absolute: 0 10*3/uL (ref 0.0–0.1)
Basophils Relative: 0 %
Eosinophils Absolute: 0 10*3/uL (ref 0.0–0.7)
Eosinophils Relative: 0 %
HCT: 30.4 % — ABNORMAL LOW (ref 36.0–46.0)
Hemoglobin: 9.4 g/dL — ABNORMAL LOW (ref 12.0–15.0)
Lymphocytes Relative: 7 %
Lymphs Abs: 0.4 10*3/uL — ABNORMAL LOW (ref 0.7–4.0)
MCH: 27.8 pg (ref 26.0–34.0)
MCHC: 30.9 g/dL (ref 30.0–36.0)
MCV: 89.9 fL (ref 78.0–100.0)
Monocytes Absolute: 0.3 10*3/uL (ref 0.1–1.0)
Monocytes Relative: 4 %
Neutro Abs: 5.5 10*3/uL (ref 1.7–7.7)
Neutrophils Relative %: 89 %
Platelets: 186 10*3/uL (ref 150–400)
RBC: 3.38 MIL/uL — ABNORMAL LOW (ref 3.87–5.11)
RDW: 17.7 % — ABNORMAL HIGH (ref 11.5–15.5)
WBC: 6.2 10*3/uL (ref 4.0–10.5)

## 2017-08-15 LAB — CBC
HCT: 29.4 % — ABNORMAL LOW (ref 36.0–46.0)
Hemoglobin: 9.2 g/dL — ABNORMAL LOW (ref 12.0–15.0)
MCH: 28.1 pg (ref 26.0–34.0)
MCHC: 31.3 g/dL (ref 30.0–36.0)
MCV: 89.9 fL (ref 78.0–100.0)
PLATELETS: 181 10*3/uL (ref 150–400)
RBC: 3.27 MIL/uL — ABNORMAL LOW (ref 3.87–5.11)
RDW: 17.7 % — AB (ref 11.5–15.5)
WBC: 7 10*3/uL (ref 4.0–10.5)

## 2017-08-15 LAB — BASIC METABOLIC PANEL
Anion gap: 11 (ref 5–15)
BUN: 33 mg/dL — ABNORMAL HIGH (ref 6–20)
CO2: 24 mmol/L (ref 22–32)
Calcium: 9.2 mg/dL (ref 8.9–10.3)
Chloride: 102 mmol/L (ref 101–111)
Creatinine, Ser: 2.29 mg/dL — ABNORMAL HIGH (ref 0.44–1.00)
GFR calc Af Amer: 20 mL/min — ABNORMAL LOW (ref >60)
GFR calc non Af Amer: 17 mL/min — ABNORMAL LOW (ref >60)
Glucose, Bld: 147 mg/dL — ABNORMAL HIGH (ref 65–99)
Potassium: 4.7 mmol/L (ref 3.5–5.1)
Sodium: 137 mmol/L (ref 135–145)

## 2017-08-15 LAB — URINALYSIS, ROUTINE W REFLEX MICROSCOPIC
Bilirubin Urine: NEGATIVE
Glucose, UA: NEGATIVE mg/dL
Hgb urine dipstick: NEGATIVE
Ketones, ur: NEGATIVE mg/dL
Nitrite: NEGATIVE
Protein, ur: NEGATIVE mg/dL
Specific Gravity, Urine: 1.011 (ref 1.005–1.030)
Squamous Epithelial / LPF: NONE SEEN
pH: 5 (ref 5.0–8.0)

## 2017-08-15 LAB — D-DIMER, QUANTITATIVE (NOT AT ARMC): D DIMER QUANT: 2.23 ug{FEU}/mL — AB (ref 0.00–0.50)

## 2017-08-15 LAB — TROPONIN I
Troponin I: 0.1 ng/mL (ref <0.03)
Troponin I: 0.38 ng/mL (ref <0.03)

## 2017-08-15 LAB — BRAIN NATRIURETIC PEPTIDE: B Natriuretic Peptide: 969.2 pg/mL — ABNORMAL HIGH (ref 0.0–100.0)

## 2017-08-15 LAB — CREATININE, URINE, RANDOM: CREATININE, URINE: 105.69 mg/dL

## 2017-08-15 LAB — CREATININE, SERUM
CREATININE: 2.14 mg/dL — AB (ref 0.44–1.00)
GFR calc Af Amer: 21 mL/min — ABNORMAL LOW (ref >60)
GFR calc non Af Amer: 18 mL/min — ABNORMAL LOW (ref >60)

## 2017-08-15 MED ORDER — ACETAMINOPHEN 325 MG PO TABS: 650.0000 mg | ORAL_TABLET | ORAL | Status: DC | PRN

## 2017-08-15 MED ORDER — HEPARIN (PORCINE) IN NACL 100-0.45 UNIT/ML-% IJ SOLN
1000.0000 [IU]/h | INTRAMUSCULAR | Status: DC
  Administered 2017-08-15: 1000 [IU]/h via INTRAVENOUS
  Filled 2017-08-15: qty 250

## 2017-08-15 MED ORDER — SODIUM CHLORIDE 0.9% FLUSH
3.0000 mL | Freq: Two times a day (BID) | INTRAVENOUS | Status: DC
  Administered 2017-08-16 – 2017-08-19 (×4): 3 mL via INTRAVENOUS

## 2017-08-15 MED ORDER — SODIUM CHLORIDE 0.9% FLUSH
3.0000 mL | INTRAVENOUS | Status: DC | PRN
Start: 2017-08-15 — End: 2017-08-20

## 2017-08-15 MED ORDER — FUROSEMIDE 10 MG/ML IJ SOLN
40.0000 mg | Freq: Once | INTRAMUSCULAR | Status: AC
  Administered 2017-08-15: 40 mg via INTRAVENOUS
  Filled 2017-08-15: qty 4

## 2017-08-15 MED ORDER — ALBUTEROL SULFATE (2.5 MG/3ML) 0.083% IN NEBU
5.0000 mg | INHALATION_SOLUTION | Freq: Once | RESPIRATORY_TRACT | Status: AC
  Administered 2017-08-15: 5 mg via RESPIRATORY_TRACT
  Filled 2017-08-15: qty 6

## 2017-08-15 MED ORDER — SODIUM CHLORIDE 0.9 % IV SOLN: 250.0000 mL | INTRAVENOUS | Status: DC | PRN

## 2017-08-15 MED ORDER — LEVOTHYROXINE SODIUM 88 MCG PO TABS
88.0000 ug | ORAL_TABLET | Freq: Every day | ORAL | Status: DC
  Administered 2017-08-16 – 2017-08-20 (×5): 88 ug via ORAL
  Filled 2017-08-15 (×5): qty 1

## 2017-08-15 MED ORDER — VITAMIN D 1000 UNITS PO TABS
5000.0000 [IU] | ORAL_TABLET | Freq: Every day | ORAL | Status: DC
  Administered 2017-08-15 – 2017-08-20 (×6): 5000 [IU] via ORAL
  Filled 2017-08-15 (×6): qty 5

## 2017-08-15 MED ORDER — ONDANSETRON HCL 4 MG/2ML IJ SOLN: 4.0000 mg | Freq: Four times a day (QID) | INTRAMUSCULAR | Status: DC | PRN

## 2017-08-15 MED ORDER — HEPARIN BOLUS VIA INFUSION
3700.0000 [IU] | Freq: Once | INTRAVENOUS | Status: DC
  Filled 2017-08-15: qty 3700

## 2017-08-15 MED ORDER — ASPIRIN EC 81 MG PO TBEC
81.0000 mg | DELAYED_RELEASE_TABLET | Freq: Every day | ORAL | Status: DC
  Administered 2017-08-15 – 2017-08-19 (×5): 81 mg via ORAL
  Filled 2017-08-15 (×5): qty 1

## 2017-08-15 NOTE — Progress Notes (Signed)
I saw and examined Gina Wilkinson.  I have discussed with Dr. Dallas Schimke.  I agree with her management.  I will cosign the H&PE when available.  Briefly, Gina Wilkinson, functional 83 yo female with CKD, CHF, severe AS plus MR &Gina presents with dyspnea and increased O2 requirement.  She has a bit of AKI.  By exam, BNP and CXR, she seems to be a touch volume overloaded.  She is now comfortable in ER on O2.  I do not see any major new problems.  I do think she has a very narrow window of fluid status and almost no reserve.  Plan is to tweak meds and see if we can restore homeostasis.

## 2017-08-15 NOTE — Telephone Encounter (Signed)
[name] daughter [name] called in to say that her mother is in a very weakened state this morning and that she is having trouble breathing and making some weird noises. I told her to call EMS and she said that she was not sure that she could get her to go. She wants you to call her back at [phone] ASAP.Gina Wilkinson

## 2017-08-15 NOTE — ED Notes (Signed)
[name] - [phone] [facility] [place] - [phone]

## 2017-08-15 NOTE — ED Notes (Signed)
Pt placed on 6L [place], taken off venti mask. Saturations 98%

## 2017-08-15 NOTE — ED Provider Notes (Signed)
[facility] DEPT Provider Note   CSN: [id number] Arrival date & time: 08/15/17  1011     History   Chief Complaint Chief Complaint  Patient presents with  . Shortness of Breath    HPI Gina Wilkinson is a 83 y.o. female.  HPI Patient presents to the emergency department with significant respiratory distress.  She states that over the evening last night she got more short of breath.  She is on home oxygen and had to increase her oxygenation at home.  Patient states that they called EMS due to the fact she started having worsening shortness of breath this morning.  Patient states that nothing seems make the condition better.  She states that EMS did give her oxygen by mask, which seemed to help somewhat. The patient denies chest pain, headache,blurred vision, neck pain, fever, weakness, numbness, dizziness, anorexia, edema, abdominal pain, nausea, vomiting, diarrhea, rash, back pain, dysuria, hematemesis, bloody stool, near syncope, or syncope. Past Medical History:  Diagnosis Date  . Abdominal pain   . Acute on chronic congestive heart failure ([place])   . Acute respiratory distress   . Acute respiratory failure with hypoxia ([place])   . Anemia, unspecified 07/27/2014  . Aortic stenosis   . Bradycardia 01/18/2016  . Chronic diastolic CHF (congestive heart failure) ([place])   . Chronic respiratory failure (HCC)    a. on home O2 qhs and PRN.  . CKD (chronic kidney disease), stage IV ([place])    a. per review of historical labs.  . Encephalopathy 02/03/2016  . Hematoma 05/11/2016  . Hip fracture ([place]) 11/09/2013  . Hypertension   . Hypothyroidism   . MDS (myelodysplastic syndrome) ([place]) 10/05/2011  . Mild cognitive impairment   . Moderate to severe pulmonary hypertension ([place])   . Neoplasm of uncertain behavior 9/37/9024  . Permanent atrial fibrillation ([place]) dx Mar. 2010   a. Not on anticoag due to myelodysplastic syndrome.  . Pneumonia and influenza   . Pressure injury of skin 01/01/2017    . Pulmonary hypertension ([place])   . RBBB   . Severe aortic stenosis     Patient Active Problem List   Diagnosis Date Noted  . Dyspnea 08/15/2017  . Leg swelling 07/02/2017  . Cellulitis of leg, right; Possible 06/28/2017  . Acute on chronic kidney failure ([place]) 04/30/2017  . Hyperglycemia 04/30/2017  . Fatigue 04/26/2017  . Hiatal hernia, Large intrathoracic portion 03/08/2017  . Anemia of chronic renal failure, stage 4 (severe) ([place]) 01/01/2017  . DNR (do not resuscitate) 02/24/2016  . aortic stenosis   . Permanent atrial fibrillation ([place]) 01/18/2016  . CKD (chronic kidney disease), stage IV ([place]) 01/18/2016  . Chronic respiratory failure ([place]) 01/18/2016  . Deficiency anemia 07/27/2014  . Aortic stenosis, Severe 12/08/2013  . Essential hypertension 12/08/2013  . Pulmonary HTN ([place]) 12/08/2013  . Chronic diastolic heart failure ([place]) 11/09/2013  . MDS (myelodysplastic syndrome) ([place]) 10/05/2011  . Hypothyroidism 02/15/2009    Past Surgical History:  Procedure Laterality Date  . APPENDECTOMY    . ORIF FEMUR FRACTURE  2001   left  . ORIF PERIPROSTHETIC FRACTURE Left 11/12/2013   Procedure: OPEN REDUCTION INTERNAL FIXATION (ORIF) LEFT HIP PERIPROSTHETIC FRACTURE,;  Surgeon: Marianna Payment, MD;  Location: [facility] ORS;  Service: Orthopedics;  Laterality: Left;    OB History    No data available       Home Medications    Prior to Admission medications   Medication Sig Start Date End Date Taking? Authorizing Provider  amLODipine (NORVASC) 5 MG tablet TAKE 1 TABLET (5 MG TOTAL) BY MOUTH DAILY. 08/05/17  Yes McDiarmid, Blane Ohara, MD  aspirin 81 MG tablet Take 81 mg by mouth daily.   Yes [provider]  Cholecalciferol (VITAMIN D PO) Take 5,000 Units by mouth daily.   Yes [provider]  Cyanocobalamin (VITAMIN B-12 PO) Take 3,000 mg by mouth daily.   Yes [provider]  Darbepoetin Alfa-Albumin (ARANESP IJ) Inject as directed every 14 (fourteen)  days.   Yes Gorsuch, Ni, MD  furosemide (LASIX) 20 MG tablet Take 10-20 mg by mouth See admin instructions. Alternate 10mg  and 20mg  every other day 07/17/17  Yes McDiarmid, Blane Ohara, MD  levothyroxine (SYNTHROID) 88 MCG tablet Take 1 tablet (88 mcg total) by mouth daily. Take on empty stomach. 05/01/17  Yes McDiarmid, Blane Ohara, MD  OXYGEN Inhale 2 L/min into the lungs. Use oxygen supplementation at least 16 hours per day   Yes [provider]  clindamycin (CLEOCIN) 300 MG capsule Take 300 mg by mouth 4 (four) times daily. For seven days (DENTAL APPOINTMENTS) 06/28/17   [provider]    Family History Family History  Problem Relation Age of Onset  . Appendicitis Mother        Died age 52s-60s of ruptured appendix    Social History Social History  Substance Use Topics  . Smoking status: Never Smoker  . Smokeless tobacco: Never Used  . Alcohol use No     Allergies   Baclofen; Levaquin [levofloxacin]; Penicillins; and Sulfonamide derivatives   Review of Systems Review of Systems All other systems negative except as documented in the HPI. All pertinent positives and negatives as reviewed in the HPI.  Physical Exam Updated Vital Signs BP 136/65   Pulse 94   Temp 97.6 F (36.4 C) (Oral)   Resp (!) 21   SpO2 96%   Physical Exam  Constitutional: She is oriented to person, place, and time. She appears well-developed and well-nourished. No distress.  HENT:  Head: Normocephalic and atraumatic.  Mouth/Throat: Oropharynx is clear and moist.  Eyes: Pupils are equal, round, and reactive to light.  Neck: Normal range of motion. Neck supple.  Cardiovascular: Normal rate, regular rhythm and normal heart sounds.  Exam reveals no gallop and no friction rub.   No murmur heard. Pulmonary/Chest: Tachypnea noted. She is in respiratory distress. She has no wheezes. She has rales.  Abdominal: Soft. Bowel sounds are normal. She exhibits no distension. There is no tenderness.    Neurological: She is alert and oriented to person, place, and time. She exhibits normal muscle tone. Coordination normal.  Skin: Skin is warm and dry. Capillary refill takes less than 2 seconds. No rash noted. No erythema.  Psychiatric: She has a normal mood and affect. Her behavior is normal.  Nursing note and vitals reviewed.    ED Treatments / Results  Labs (all labs ordered are listed, but only abnormal results are displayed) Labs Reviewed  BASIC METABOLIC PANEL - Abnormal; Notable for the following:       Result Value   Glucose, Bld 147 (*)    BUN 33 (*)    Creatinine, Ser 2.29 (*)    GFR calc non Af Amer 17 (*)    GFR calc Af Amer 20 (*)    All other components within normal limits  CBC WITH DIFFERENTIAL/PLATELET - Abnormal; Notable for the following:    RBC 3.38 (*)    Hemoglobin 9.4 (*)  HCT 30.4 (*)    RDW 17.7 (*)    Lymphs Abs 0.4 (*)    All other components within normal limits  TROPONIN I - Abnormal; Notable for the following:    Troponin I 0.10 (*)    All other components within normal limits  BRAIN NATRIURETIC PEPTIDE - Abnormal; Notable for the following:    B Natriuretic Peptide 969.2 (*)    All other components within normal limits  URINALYSIS, ROUTINE W REFLEX MICROSCOPIC  D-DIMER, QUANTITATIVE (NOT AT [facility])    EKG  EKG Interpretation  Date/Time:  Thursday August 15 2017 10:20:59 EDT Ventricular Rate:  47 PR Interval:    QRS Duration: 172 QT Interval:  516 QTC Calculation: 457 R Axis:   132 Text Interpretation:  Atrial fibrillation Right bundle branch block Probable lateral infarct, old HR has slowed since last tracing, otherwise no change Confirmed by Malvin Johns [phone]) on 08/15/2017 10:31:24 AM       Radiology Dg Chest Port 1 View  Result Date: 08/15/2017 CLINICAL DATA:  Shortness of Breath EXAM: PORTABLE CHEST 1 VIEW COMPARISON:  12/30/2016 FINDINGS: Cardiac shadow is again enlarged. Aortic calcifications are again noted. The  hiatal hernia is again seen and predominately air filled. Diffuse interstitial changes are seen within both lungs although a slight increase in right pleural effusion is noted. These have been stable since 2014 and likely of a chronic nature with the exception of the new right pleural effusion. Mild vascular congestion is noted. IMPRESSION: Chronic appearing changes bilaterally. Mild vascular congestion is noted with a new right pleural effusion. Electronically Signed   By: Inez Catalina M.D.   On: 08/15/2017 11:16    Procedures Procedures (including critical care time)  Medications Ordered in ED Medications  albuterol (PROVENTIL) (2.5 MG/3ML) 0.083% nebulizer solution 5 mg (5 mg Nebulization Given 08/15/17 1135)  furosemide (LASIX) injection 40 mg (40 mg Intravenous Given 08/15/17 1502)     Initial Impression / Assessment and Plan / ED Course  I have reviewed the triage vital signs and the nursing notes.  Pertinent labs & imaging results that were available during my care of the patient were reviewed by me and considered in my medical decision making (see chart for details).     CRITICAL CARE Performed by: [facility] Total critical care time: 45 minutes Critical care time was exclusive of separately billable procedures and treating other patients. Critical care was necessary to treat or prevent imminent or life-threatening deterioration. Critical care was time spent personally by me on the following activities: development of treatment plan with patient and/or surrogate as well as nursing, discussions with consultants, evaluation of patient's response to treatment, examination of patient, obtaining history from patient or surrogate, ordering and performing treatments and interventions, ordering and review of laboratory studies, ordering and review of radiographic studies, pulse oximetry and re-evaluation of patient's condition.  Patient required BiPAP because her pulse oximetry was  in the mid 70s.  Patient responded well to this.  She was eventually weaned off of this.  Patient be admitted to the hospital for further evaluation and care of her hypoxia Final Clinical Impressions(s) / ED Diagnoses   Final diagnoses:  SOB (shortness of breath)    New Prescriptions New Prescriptions   No medications on file     Dalia Heading, PA-C 08/15/17 1621    Malvin Johns, MD 08/23/17 [phone]

## 2017-08-15 NOTE — H&P (Signed)
[facility] Admission History and Physical Service Pager: [phone]  Patient name: Gina Wilkinson Medical record number: [id number] Date of birth: [birth year] Age: 83 y.o. Gender: female  Primary Care Provider: McDiarmid, Blane Ohara, MD Consultants: Cardiology Code Status: DNR  Chief Complaint:   Dyspnea  Assessment and Plan: Gina Wilkinson is a 83 y.o. female presenting with Dyspnea and fatigue that started on the evening of 9/19. PMH is significant for HFpEF, Aortic Stenosis, Chronic Afib, CKD IV, Pulm artery HTN, Chronic Resp Failure, myelodysplastic syndrome, and anemia   Dyspnea with Hypoxia: Stabilizing. Patient is likely presenting with acute CHF exacerbation vs PE vs worsening aortic stenosis and pulmonary HTN. Supporting evidence is presentation, crackles on exam, with CXR showing mild congestive changes with a right pleural effusion. She also has chronic respiratory failure (likley from pulmonary HTN) and this could be a contributing factor to her current condition or possibly the main driving force. ECHO in 12/2016 65-70%, heavily calcified with severe stenosis, mild mitral valve stenosis and regurgitation, PA peak pressuer 60mmHg. Worsening chronic anemia even less likely to be main cause of current symptoms but possible. EKG is with Afib (bradycardia) with RBBB which is unchanged from prior. Troponin I 0.1 (lower than prior and likely due to demand ischemia). Currently stable on venturi mask.  - admit to stepdown, attending Dr. Andria Frames - s/p IV lasix 40mg  x 1 in the ED. Will monitor without further doses. Do not want to over do diuresis with her severe aortic stenosis.  - daily weights (no weight today) - strict I/O - CBC in am - V/Q scan order as D-dimer is elevated. Will start IV heparin empirically until we can rule out PE. Close monitoring since she does have MDS.  - cardiology consulted (follows Dr. Lovena Le as outpatient) - repeat ECHO - trend  troponins - with her multiple comorbidities likely contributing to her symptoms (severe aortic stenosis/pulm HTN) and worsened creatinine with increased dose of PO lasix will consult palliative care for [name] discussion - continue Venturi mask and wean O2 as able  AKI on CKD4: Patient has baseline Cr of 1.5-1.8. At presentation her Cr is 2.29 Likely prerenal as patient plus combination of increased use of Lasix over the last few weeks. She is likely intravascularly depleted even though her BNP is elevated. Patient has reported no issues with urination so unlikely postrenal. - Continue to gently diuresis if needed with Lasix to help with pulmonary edema - BMP in am - strict I/O - avoid other nephrotoxic agents  Hypothyroidism: Stable. She has been taking her home meds as she has help with an at home caregiver. Last TSH 1.671 in 05/2017 - Continue Synthroid 66mcg  Atrial Fibrillation, chronic: with Bradycardia (which is intermittent and chronic for patient). Not on beta blocker due to this. Due to MDS and anemia, not on anticoagulation as an outpatient.  - Patient has unchanged EKG from previous and is asymptomatic.   Myleodysplastic Syndrome: Stable. Followed by Oncology. Patient has been taking Darbepoetin Alfa-Albumin for several years. - Follow up with Oncology outpatient to resume treatment  HFpEF/Severe Aortic Stenosis/Pulmonary HTN: ECHO in 12/2016 65-70%, heavily calcified with severe stenosis, mild mitral valve stenosis and regurgitation, PA peak pressuer 74mmHg. Patient is not a surgical candidate for her Aortic Stenosis. - plan as noted above  FEN/GI: heart healthy diet Prophylaxis: IV Heparin  Disposition:  admit to step down for further evaluation  History of Present Illness:  Gina Wilkinson is a 83  y.o. female presenting with increased work of breathing for the past 3 days or so by the family however it worsened greatly last night. On the evening of 9/19 her home care giver  noticed she was working even harder to breath with dyspnea. Her home oxygen (normal 2L) was adjusted up and she went to sleep. On the morning of 9/20 she was complaining of increased shortness of breath and was working harder to breath and her oxygen was increased again. She also did not want to get up out of bed which is not normal for her. By this afternoon her breathing was getting worse, they called the [facility] clinic and were told to come to the ED for further evaluation. Upon arrival in the ED she was found to have tachypnea RR 27, bradycardia with a HR in the mid 40s - 50s. Her O2 sats were in the 70s to low 80s. Her oxygen was increased, she was given one hour of Bipap, then venturi mask, and a she was given a breathing treatment (albuterol nebs) x 1. She was also given 40mg  IV Lasix. She has been taking Lasix 20mg  and 10mg  daily alternating. Reports urinating well with this dose. Also of note, per her daughter she has mild cognitive impairment.  Her baseline weight is 135-137lb. CXR in the ED found "chronic appearing changes bilaterally. Mild vascular congestion is noted with a new right pleural effusion." Of note, her BNP is elevated above her baseline and is at 969.2. Her EKG showed irregularly irregular rhythm with a RBBB that was unchanged from previous EKG on 12/29/16. Her last Echo was also from 12/2016. Troponin is elevated at 0.10  Patient denies fever, pain on deep inspiration, chest pain. No pain, no recent illness (rhinorrhea/nasal congestion). Daughter reports of chronic dry cough for the past 4-5 months that has been stable. Caretaker reported that she had mentioned of some nausea this morning. She also denies abd pain, dysuria or urinary frequency  Per chart review, PCP increased Lasix dose to 20mg /10mg  alternating. Then on 9/13 recommended going back to 10 daily.   Review Of Systems: Per HPI with the following additions: None, see HPI above  ROS  Patient Active Problem List    Diagnosis Date Noted  . Leg swelling 07/02/2017  . Cellulitis of leg, right; Possible 06/28/2017  . Acute on chronic kidney failure ([place]) 04/30/2017  . Hyperglycemia 04/30/2017  . Fatigue 04/26/2017  . Hiatal hernia, Large intrathoracic portion 03/08/2017  . Anemia of chronic renal failure, stage 4 (severe) ([place]) 01/01/2017  . DNR (do not resuscitate) 02/24/2016  . aortic stenosis   . Permanent atrial fibrillation ([place]) 01/18/2016  . CKD (chronic kidney disease), stage IV ([place]) 01/18/2016  . Chronic respiratory failure ([place]) 01/18/2016  . Deficiency anemia 07/27/2014  . Aortic stenosis, Severe 12/08/2013  . Essential hypertension 12/08/2013  . Pulmonary HTN ([place]) 12/08/2013  . Chronic diastolic heart failure ([place]) 11/09/2013  . MDS (myelodysplastic syndrome) ([place]) 10/05/2011  . Hypothyroidism 02/15/2009    Past Medical History: Past Medical History:  Diagnosis Date  . Abdominal pain   . Acute on chronic congestive heart failure ([place])   . Acute respiratory distress   . Acute respiratory failure with hypoxia ([place])   . Anemia, unspecified 07/27/2014  . Aortic stenosis   . Bradycardia 01/18/2016  . Chronic diastolic CHF (congestive heart failure) ([place])   . Chronic respiratory failure ([facility])    a. on home O2 qhs and PRN.  . CKD (chronic kidney disease), stage  IV ([place])    a. per review of historical labs.  . Encephalopathy 02/03/2016  . Hematoma 05/11/2016  . Hip fracture ([place]) 11/09/2013  . Hypertension   . Hypothyroidism   . MDS (myelodysplastic syndrome) ([place]) 10/05/2011  . Mild cognitive impairment   . Moderate to severe pulmonary hypertension ([place])   . Neoplasm of uncertain behavior 12/30/5595  . Permanent atrial fibrillation ([place]) dx Mar. 2010   a. Not on anticoag due to myelodysplastic syndrome.  . Pneumonia and influenza   . Pressure injury of skin 01/01/2017  . Pulmonary hypertension ([place])   . RBBB   . Severe aortic stenosis     Past Surgical History: Past Surgical  History:  Procedure Laterality Date  . APPENDECTOMY    . ORIF FEMUR FRACTURE  2001   left  . ORIF PERIPROSTHETIC FRACTURE Left 11/12/2013   Procedure: OPEN REDUCTION INTERNAL FIXATION (ORIF) LEFT HIP PERIPROSTHETIC FRACTURE,;  Surgeon: Marianna Payment, MD;  Location: [facility] ORS;  Service: Orthopedics;  Laterality: Left;    Social History: Social History  Substance Use Topics  . Smoking status: Never Smoker  . Smokeless tobacco: Never Used  . Alcohol use No   Additional social history: none Please also refer to relevant sections of EMR.  Family History: Family History  Problem Relation Age of Onset  . Appendicitis Mother        Died age 58s-60s of ruptured appendix    Allergies and Medications: Allergies  Allergen Reactions  . Baclofen Other (See Comments)    Severe altered mental status, extreme sedation   . Levaquin [Levofloxacin] Anaphylaxis  . Penicillins Anaphylaxis    Has patient had a PCN reaction causing immediate rash, facial/tongue/throat swelling, SOB or lightheadedness with hypotension: Yes Has patient had a PCN reaction causing severe rash involving mucus membranes or skin necrosis: No Has patient had a PCN reaction that required hospitalization No Has patient had a PCN reaction occurring within the last 10 years: No If all of the above answers are "NO", then may proceed with Cephalosporin use.   . Sulfonamide Derivatives Anaphylaxis   No current facility-administered medications on file prior to encounter.    Current Outpatient Prescriptions on File Prior to Encounter  Medication Sig Dispense Refill  . amLODipine (NORVASC) 5 MG tablet TAKE 1 TABLET (5 MG TOTAL) BY MOUTH DAILY. 90 tablet 3  . aspirin 81 MG tablet Take 81 mg by mouth daily.    . Cholecalciferol (VITAMIN D PO) Take 5,000 Units by mouth daily.    . Cyanocobalamin (VITAMIN B-12 PO) Take 3,000 mg by mouth daily.    . Darbepoetin Alfa-Albumin (ARANESP IJ) Inject as directed every 14 (fourteen)  days.    . furosemide (LASIX) 20 MG tablet Take 10-20 mg by mouth See admin instructions. Alternate 10mg  and 20mg  every other day 30 tablet 3  . levothyroxine (SYNTHROID) 88 MCG tablet Take 1 tablet (88 mcg total) by mouth daily. Take on empty stomach. 90 tablet 1  . OXYGEN Inhale 2 L/min into the lungs. Use oxygen supplementation at least 16 hours per day      Objective: BP (!) 143/60   Pulse (!) 52   Temp 97.6 F (36.4 C) (Oral)   Resp 17   SpO2 97%  Exam: General: Alert and Oriented x 2; NAD Eyes: EOMI, PERRL,  ENTM: Non-erythematous pharyngeal mucosa, no exudates Neck: Trachea midline, No JVD Cardiovascular: bradycardia, Irregulary irregular rhythm, 4/6 systolic murmur best heard at RUSB Respiratory: Bilateral crackles heard best at  mid-lower lung fields Gastrointestinal: non-distended, soft, non-tender, +bs in all quadrants MSK: FROM of all extremities, needs walker for ambulating, trace pitting edema in lower extremities bilaterally. No calf pain bilaterally. Derm: warm, dry, intact, no rashes Neuro: 5/5 strength grossly bilateral UE and LE Psych: appropriate mood and behavior  Labs and Imaging: CBC BMET   Recent Labs Lab 08/15/17 1128  WBC 6.2  HGB 9.4*  HCT 30.4*  PLT 186    Recent Labs Lab 08/15/17 1128  NA 137  K 4.7  CL 102  CO2 24  BUN 33*  CREATININE 2.29*  GLUCOSE 147*  CALCIUM 9.2     9/20 - BNP: 969.2 9/20 - Troponin: 0.10,  9/20 - U/A: pending 9/20 - EKG: Irregularly irregular with RBBB; unchanged from previous 9/20 - CXR: IMPRESSION: Chronic appearing changes bilaterally. Mild vascular congestion is noted with a new right pleural effusion. 9/20 - D-Dimer: 2.23 9/20: V/Q scan: pending  Nuala Alpha, DO 08/15/2017, 2:36 PM PGY-1, [place] [facility] pager: [phone], text pages welcome  UPPER LEVEL ADDENDUM  I have read the above note and made revisions highlighted in blue.  Smiley Houseman, MD PGY-2  [facility] Pager [phone]

## 2017-08-15 NOTE — Telephone Encounter (Signed)
Called daughter back on behalf of Dr. McDiarmid and explained to her that mother should be evaluated in the ED due to her explanations of symptoms. [name] states that patient's oxygen was upped form 2L to 3L on her concentrator but it doesn't seem to be helping her breathing "sounds off".  Patient is no longer coughing which daughter states was an issue over the night.  Advised daughter that she should call EMS as they will come out to the house and evaluate patient on site and transport them to the ED.  Daughter voiced understanding and will do this.  I informed her that I will let provider know of plan. Jazmin Hartsell,CMA

## 2017-08-15 NOTE — ED Triage Notes (Signed)
Patient brought in for shortness of breath and bradycardia with [facility].  Family reports patient is usually very active but today has been too weak to stand.  She wears 2L O2 [place] at baseline, but per family home pulse oximeter read 60%, no improvement increasing [place] O2 to 3L.  Patient placed on 50% O2 Venturi on arrival to ED.  O2 sat increased to 85%.  Alert and oriented, denies pain.  Patient is also bradycardic with irregular rate between 35-45.

## 2017-08-15 NOTE — ED Notes (Signed)
Pt given snacks.

## 2017-08-15 NOTE — Consult Note (Signed)
CONSULTATION NOTE   Patient Name: Gina Wilkinson Date of Encounter: 08/15/2017 Cardiologist: [facility] Problem List   Principal Problem:   Acute diastolic (congestive) heart failure [facility]) Active Problems:   Aortic stenosis, Severe   Permanent atrial fibrillation (HCC)   CKD (chronic kidney disease), stage IV ([place])   Dyspnea    HPI   Gina Wilkinson is a 83 y.o. female who is being seen today for the evaluation of dyspnea at the request of Dr. Andria Frames.  Gina Wilkinson is a pleasant female patient of Dr. Lovena Wilkinson. In February 2018 she was admitted for weakness, fever and acute dyspnea. There is concern for possible sepsis and an echocardiogram showed severe aortic stenosis. There is a heavily calcified aortic valve and a concern for possible aortic regurgitation. We were asked to perform a TEE however consultation was felt that she had no clinical criteria for endocarditis and therefore this was not pursued. It was also noted the patient was not interested in TEE. Previously she is been seen by Dr. Angelena Form in consultation for another issue, but he subsequently noted that she had severe aortic stenosis. He reported that given her age and dementia, she was not a candidate for SAVR or TAVR. She now presents with dyspnea and fatigue. Chest x-ray shows mild bilateral congestion and a right pleural effusion. Creatinine is also worsened, possibly representing low output. She was given Lasix in the emergency department with a positive response. She reports compliance with home medications. She is in long-standing persistent if not permanent atrial fibrillation which is been stable. She is not anticoagulated due to a "long-standing agreement" with Dr. Lovena Wilkinson that she would not be anticoagulated. She is maintained on low-dose aspirin.  PMHx   Past Medical History:  Diagnosis Date  . Abdominal pain   . Acute on chronic congestive heart failure ([place])   . Acute respiratory  distress   . Acute respiratory failure with hypoxia ([place])   . Anemia, unspecified 07/27/2014  . Aortic stenosis   . Bradycardia 01/18/2016  . Chronic diastolic CHF (congestive heart failure) ([place])   . Chronic respiratory failure (HCC)    a. on home O2 qhs and PRN.  . CKD (chronic kidney disease), stage IV ([place])    a. per review of historical labs.  . Encephalopathy 02/03/2016  . Hematoma 05/11/2016  . Hip fracture ([place]) 11/09/2013  . Hypertension   . Hypothyroidism   . MDS (myelodysplastic syndrome) ([place]) 10/05/2011  . Mild cognitive impairment   . Moderate to severe pulmonary hypertension ([place])   . Neoplasm of uncertain behavior 2/58/5277  . Permanent atrial fibrillation ([place]) dx Mar. 2010   a. Not on anticoag due to myelodysplastic syndrome.  . Pneumonia and influenza   . Pressure injury of skin 01/01/2017  . Pulmonary hypertension ([place])   . RBBB   . Severe aortic stenosis     Past Surgical History:  Procedure Laterality Date  . APPENDECTOMY    . ORIF FEMUR FRACTURE  2001   left  . ORIF PERIPROSTHETIC FRACTURE Left 11/12/2013   Procedure: OPEN REDUCTION INTERNAL FIXATION (ORIF) LEFT HIP PERIPROSTHETIC FRACTURE,;  Surgeon: Marianna Payment, MD;  Location: [facility];  Service: Orthopedics;  Laterality: Left;    FAMHx   Family History  Problem Relation Age of Onset  . Appendicitis Mother        Died age 56s-60s of ruptured appendix    SOCHx    reports that she has  never smoked. She has never used smokeless tobacco. She reports that she does not drink alcohol or use drugs.  Outpatient Medications   No current facility-administered medications on file prior to encounter.    Current Outpatient Prescriptions on File Prior to Encounter  Medication Sig Dispense Refill  . amLODipine (NORVASC) 5 MG tablet TAKE 1 TABLET (5 MG TOTAL) BY MOUTH DAILY. 90 tablet 3  . aspirin 81 MG tablet Take 81 mg by mouth daily.    . Cholecalciferol (VITAMIN D PO) Take 5,000 Units by mouth daily.     . Cyanocobalamin (VITAMIN B-12 PO) Take 3,000 mg by mouth daily.    . Darbepoetin Alfa-Albumin (ARANESP IJ) Inject as directed every 14 (fourteen) days.    . furosemide (LASIX) 20 MG tablet Take 10-20 mg by mouth See admin instructions. Alternate 47m and 261mevery other day 30 tablet 3  . levothyroxine (SYNTHROID) 88 MCG tablet Take 1 tablet (88 mcg total) by mouth daily. Take on empty stomach. 90 tablet 1  . OXYGEN Inhale 2 L/min into the lungs. Use oxygen supplementation at least 16 hours per day      Inpatient Medications    Scheduled Meds:   Continuous Infusions: . heparin 1,000 Units/hr (08/15/17 1715)    PRN Meds:    ALLERGIES   Allergies  Allergen Reactions  . Baclofen Other (See Comments)    Severe altered mental status, extreme sedation   . Levaquin [Levofloxacin] Anaphylaxis  . Penicillins Anaphylaxis    Has patient had a PCN reaction causing immediate rash, facial/tongue/throat swelling, SOB or lightheadedness with hypotension: Yes Has patient had a PCN reaction causing severe rash involving mucus membranes or skin necrosis: No Has patient had a PCN reaction that required hospitalization No Has patient had a PCN reaction occurring within the last 10 years: No If all of the above answers are "NO", then may proceed with Cephalosporin use.   . Sulfonamide Derivatives Anaphylaxis    ROS   Pertinent items noted in HPI and remainder of comprehensive ROS otherwise negative.   Vitals   Vitals:   08/15/17 1600 08/15/17 1630 08/15/17 1645 08/15/17 1715  BP: 136/65 136/62 (!) 138/58 (!) 108/53  Pulse: 94 87 (!) 51 (!) 47  Resp: (!) 21 (!) 29 (!) 24 20  Temp:      TempSrc:      SpO2: 96% 99% 98% 98%   No intake or output data in the 24 hours ending 08/15/17 1740 There were no vitals filed for this visit.  Physical Exam   General appearance: alert, appears stated age and no distress Neck: JVD - 5 cm above sternal notch, no carotid bruit and thyroid not  enlarged, symmetric, no tenderness/mass/nodules Lungs: diminished breath sounds bibasilar and dullness to percussion RLL Heart: irregularly irregular rhythm, S1, S2 normal, systolic murmur: late systolic 3/6, crescendo at 2nd right intercostal space and bradycardia Abdomen: soft, non-tender; bowel sounds normal; no masses,  no organomegaly Extremities: edema trace to 1+ edema Pulses: 2+ and symmetric Skin: Skin color, texture, turgor normal. No rashes or lesions Neurologic: Mental status: Awake, oriented to place, year Psych: Very pleasant  Labs   Results for orders placed or performed during the hospital encounter of 08/15/17 (from the past 48 hour(s))  Basic metabolic panel     Status: Abnormal   Collection Time: 08/15/17 11:28 AM  Result Value Ref Range   Sodium 137 135 - 145 mmol/L   Potassium 4.7 3.5 - 5.1 mmol/L   Chloride 102  101 - 111 mmol/L   CO2 24 22 - 32 mmol/L   Glucose, Bld 147 (H) 65 - 99 mg/dL   BUN 33 (H) 6 - 20 mg/dL   Creatinine, Ser 2.29 (H) 0.44 - 1.00 mg/dL   Calcium 9.2 8.9 - 10.3 mg/dL   GFR calc non Af Amer 17 (L) >60 mL/min   GFR calc Af Amer 20 (L) >60 mL/min    Comment: (NOTE) The eGFR has been calculated using the CKD EPI equation. This calculation has not been validated in all clinical situations. eGFR's persistently <60 mL/min signify possible Chronic Kidney Disease.    Anion gap 11 5 - 15  CBC with Differential     Status: Abnormal   Collection Time: 08/15/17 11:28 AM  Result Value Ref Range   WBC 6.2 4.0 - 10.5 K/uL   RBC 3.38 (L) 3.87 - 5.11 MIL/uL   Hemoglobin 9.4 (L) 12.0 - 15.0 g/dL   HCT 30.4 (L) 36.0 - 46.0 %   MCV 89.9 78.0 - 100.0 fL   MCH 27.8 26.0 - 34.0 pg   MCHC 30.9 30.0 - 36.0 g/dL   RDW 17.7 (H) 11.5 - 15.5 %   Platelets 186 150 - 400 K/uL   Neutrophils Relative % 89 %   Neutro Abs 5.5 1.7 - 7.7 K/uL   Lymphocytes Relative 7 %   Lymphs Abs 0.4 (L) 0.7 - 4.0 K/uL   Monocytes Relative 4 %   Monocytes Absolute 0.3 0.1 -  1.0 K/uL   Eosinophils Relative 0 %   Eosinophils Absolute 0.0 0.0 - 0.7 K/uL   Basophils Relative 0 %   Basophils Absolute 0.0 0.0 - 0.1 K/uL  Troponin I     Status: Abnormal   Collection Time: 08/15/17 11:28 AM  Result Value Ref Range   Troponin I 0.10 (HH) <0.03 ng/mL    Comment: CRITICAL RESULT CALLED TO, READ BACK BY AND VERIFIED WITH: C.ROWE,RN 1232 08/15/17 CLARK,S   Brain natriuretic peptide     Status: Abnormal   Collection Time: 08/15/17 11:28 AM  Result Value Ref Range   B Natriuretic Peptide 969.2 (H) 0.0 - 100.0 pg/mL  D-dimer, quantitative (not at [facility])     Status: Abnormal   Collection Time: 08/15/17  4:07 PM  Result Value Ref Range   D-Dimer, Quant 2.23 (H) 0.00 - 0.50 ug/mL-FEU    Comment: (NOTE) At the manufacturer cut-off of 0.50 ug/mL FEU, this assay has been documented to exclude PE with a sensitivity and negative predictive value of 97 to 99%.  At this time, this assay has not been approved by the FDA to exclude DVT/VTE. Results should be correlated with clinical presentation.   Urinalysis, Routine w reflex microscopic     Status: Abnormal   Collection Time: 08/15/17  4:33 PM  Result Value Ref Range   Color, Urine YELLOW YELLOW   APPearance CLEAR CLEAR   Specific Gravity, Urine 1.011 1.005 - 1.030   pH 5.0 5.0 - 8.0   Glucose, UA NEGATIVE NEGATIVE mg/dL   Hgb urine dipstick NEGATIVE NEGATIVE   Bilirubin Urine NEGATIVE NEGATIVE   Ketones, ur NEGATIVE NEGATIVE mg/dL   Protein, ur NEGATIVE NEGATIVE mg/dL   Nitrite NEGATIVE NEGATIVE   Leukocytes, UA LARGE (A) NEGATIVE   RBC / HPF 0-5 0 - 5 RBC/hpf   WBC, UA 6-30 0 - 5 WBC/hpf   Bacteria, UA RARE (A) NONE SEEN   Squamous Epithelial / LPF NONE SEEN NONE SEEN   Mucus PRESENT  ECG   A-fib with RBBB at 75 - Personally Reviewed  Telemetry   A-fib with slow VR - Personally Reviewed  Radiology   Dg Chest Port 1 View  Result Date: 08/15/2017 CLINICAL DATA:  Shortness of Breath EXAM: PORTABLE  CHEST 1 VIEW COMPARISON:  12/30/2016 FINDINGS: Cardiac shadow is again enlarged. Aortic calcifications are again noted. The hiatal hernia is again seen and predominately air filled. Diffuse interstitial changes are seen within both lungs although a slight increase in right pleural effusion is noted. These have been stable since 2014 and likely of a chronic nature with the exception of the new right pleural effusion. Mild vascular congestion is noted. IMPRESSION: Chronic appearing changes bilaterally. Mild vascular congestion is noted with a new right pleural effusion. Electronically Signed   By: Inez Catalina M.D.   On: 08/15/2017 11:16    Cardiac Studies   N/A  Impression   Principal Problem:   Acute diastolic (congestive) heart failure (HCC) Active Problems:   Aortic stenosis, Severe   Permanent atrial fibrillation (HCC)   CKD (chronic kidney disease), stage IV (HCC)   Dyspnea   Recommendation   Gina Wilkinson presents with acute diastolic congestive heart failure, likely secondary to severe aortic stenosis. BNP is elevated at 969, there is pulmonary edema, non-productive cough and orthopnea as well as JVD. She is not a candidate for SAVR or TAVR given renal insufficiency, memory loss and age. Her creatinine is likely elevated due to decreased cardiac output and will improve with diuresis. She does have a significant bradycardic ventricular response to A. fib with a wide right bundle branch block. She has had a slow heart rate in the past. I would avoid AV nodal blocking agents. We'll plan to continue diuresis with Lasix. D-dimer was expectedly elevated - age >34 (age in years x 10 mcg/L). Therefore the upper limit of normal for d-dimer is 9.95 ug/mL (her d-dimer was 2.23). I suspect her V/Q scan will be negative.  Thanks for the consultation. We will follow with you.  Time Spent Directly with Patient:  I have spent a total of 35 minutes with the patient reviewing hospital notes, telemetry,  EKGs, labs and examining the patient as well as establishing an assessment and plan that was discussed personally with the patient. > 50% of time was spent in direct patient care.  Length of Stay:  LOS: 0 days   Pixie Casino, MD, [place]  Attending Cardiologist  Direct Dial: [phone]  Fax: [phone]  Website: [email or web address] [employer] 08/15/2017, 5:40 PM

## 2017-08-15 NOTE — Progress Notes (Signed)
ANTICOAGULATION CONSULT NOTE - Initial Consult  Pharmacy Consult for heparin Indication: rule out PE  Allergies  Allergen Reactions  . Baclofen Other (See Comments)    Severe altered mental status, extreme sedation   . Levaquin [Levofloxacin] Anaphylaxis  . Penicillins Anaphylaxis    Has patient had a PCN reaction causing immediate rash, facial/tongue/throat swelling, SOB or lightheadedness with hypotension: Yes Has patient had a PCN reaction causing severe rash involving mucus membranes or skin necrosis: No Has patient had a PCN reaction that required hospitalization No Has patient had a PCN reaction occurring within the last 10 years: No If all of the above answers are "NO", then may proceed with Cephalosporin use.   . Sulfonamide Derivatives Anaphylaxis    Patient Measurements:   Heparin Dosing Weight: 62.1 kg  Vital Signs: Temp: 97.6 F (36.4 C) (09/20 1018) Temp Source: Oral (09/20 1018) BP: 138/58 (09/20 1645) Pulse Rate: 51 (09/20 1645)  Labs:  Recent Labs  08/15/17 1128  HGB 9.4*  HCT 30.4*  PLT 186  CREATININE 2.29*  TROPONINI 0.10*    Estimated Creatinine Clearance: 13.8 mL/min (A) (by C-G formula based on SCr of 2.29 mg/dL (H)).   Medical History: Past Medical History:  Diagnosis Date  . Abdominal pain   . Acute on chronic congestive heart failure ([place])   . Acute respiratory distress   . Acute respiratory failure with hypoxia ([place])   . Anemia, unspecified 07/27/2014  . Aortic stenosis   . Bradycardia 01/18/2016  . Chronic diastolic CHF (congestive heart failure) ([place])   . Chronic respiratory failure (HCC)    a. on home O2 qhs and PRN.  . CKD (chronic kidney disease), stage IV ([place])    a. per review of historical labs.  . Encephalopathy 02/03/2016  . Hematoma 05/11/2016  . Hip fracture ([place]) 11/09/2013  . Hypertension   . Hypothyroidism   . MDS (myelodysplastic syndrome) ([place]) 10/05/2011  . Mild cognitive impairment   . Moderate to severe  pulmonary hypertension ([place])   . Neoplasm of uncertain behavior 2/35/5732  . Permanent atrial fibrillation ([place]) dx Mar. 2010   a. Not on anticoag due to myelodysplastic syndrome.  . Pneumonia and influenza   . Pressure injury of skin 01/01/2017  . Pulmonary hypertension ([place])   . RBBB   . Severe aortic stenosis     Assessment: 83 yo female admitted with generalized weakness and requiring more O2 than baseline. D-dimer elevated, planning to send patient for V/Q scan. SCr up to 2.2, eCrCl < 15 ml/min. hgb 9.4, plts wnl.   Goal of Therapy:  Heparin level 0.3-0.7 units/ml Monitor platelets by anticoagulation protocol: Yes    Plan:  -Heparin 1000 units/hr -Daily HL, CBC -Level tonight  Harvel Quale 08/15/2017,4:56 PM

## 2017-08-15 NOTE — ED Notes (Signed)
Dr. Andria Frames notified of positive Ddimer

## 2017-08-16 ENCOUNTER — Inpatient Hospital Stay (HOSPITAL_COMMUNITY): Payer: Medicare Other

## 2017-08-16 DIAGNOSIS — Z515 Encounter for palliative care: Secondary | ICD-10-CM

## 2017-08-16 LAB — BASIC METABOLIC PANEL
ANION GAP: 8 (ref 5–15)
BUN: 33 mg/dL — ABNORMAL HIGH (ref 6–20)
CALCIUM: 9.1 mg/dL (ref 8.9–10.3)
CO2: 28 mmol/L (ref 22–32)
Chloride: 101 mmol/L (ref 101–111)
Creatinine, Ser: 2.13 mg/dL — ABNORMAL HIGH (ref 0.44–1.00)
GFR calc non Af Amer: 19 mL/min — ABNORMAL LOW (ref >60)
GFR, EST AFRICAN AMERICAN: 22 mL/min — AB (ref >60)
GLUCOSE: 104 mg/dL — AB (ref 65–99)
POTASSIUM: 4.3 mmol/L (ref 3.5–5.1)
Sodium: 137 mmol/L (ref 135–145)

## 2017-08-16 LAB — CBC
HEMATOCRIT: 29.1 % — AB (ref 36.0–46.0)
HEMOGLOBIN: 9.1 g/dL — AB (ref 12.0–15.0)
MCH: 27.8 pg (ref 26.0–34.0)
MCHC: 31.3 g/dL (ref 30.0–36.0)
MCV: 89 fL (ref 78.0–100.0)
Platelets: 172 10*3/uL (ref 150–400)
RBC: 3.27 MIL/uL — AB (ref 3.87–5.11)
RDW: 18 % — ABNORMAL HIGH (ref 11.5–15.5)
WBC: 5.9 10*3/uL (ref 4.0–10.5)

## 2017-08-16 LAB — HEPARIN LEVEL (UNFRACTIONATED)
HEPARIN UNFRACTIONATED: 0.33 [IU]/mL (ref 0.30–0.70)
HEPARIN UNFRACTIONATED: 0.42 [IU]/mL (ref 0.30–0.70)

## 2017-08-16 LAB — UREA NITROGEN, URINE: Urea Nitrogen, Ur: 284 mg/dL

## 2017-08-16 LAB — TROPONIN I
TROPONIN I: 0.32 ng/mL — AB (ref <0.03)
TROPONIN I: 0.46 ng/mL — AB (ref <0.03)

## 2017-08-16 MED ORDER — TECHNETIUM TC 99M DIETHYLENETRIAME-PENTAACETIC ACID
30.0000 | Freq: Once | INTRAVENOUS | Status: AC | PRN
  Administered 2017-08-16: 30 via RESPIRATORY_TRACT

## 2017-08-16 MED ORDER — TECHNETIUM TO 99M ALBUMIN AGGREGATED
6.0000 | Freq: Once | INTRAVENOUS | Status: AC | PRN
  Administered 2017-08-16: 6 via INTRAVENOUS

## 2017-08-16 MED ORDER — FUROSEMIDE 10 MG/ML IJ SOLN
20.0000 mg | Freq: Once | INTRAMUSCULAR | Status: AC
  Administered 2017-08-16: 20 mg via INTRAVENOUS
  Filled 2017-08-16: qty 2

## 2017-08-16 MED ORDER — ORAL CARE MOUTH RINSE
15.0000 mL | Freq: Two times a day (BID) | OROMUCOSAL | Status: DC
  Administered 2017-08-16 – 2017-08-20 (×7): 15 mL via OROMUCOSAL

## 2017-08-16 NOTE — Progress Notes (Signed)
Discussed rising troponin with cardiology fellow. Patient is currently asymptomatic without any chest pain. EKG is unchanged. Patient currently on Hep drip while V/Q scan is pending to rule out PE. Cardiology recommended obtaining an additional trop to show plateau and continue with heparin until V/Q. Appreciate cardiology recs. Will continue to monitor patient.  Marjie Skiff, MD [place], PGY-2

## 2017-08-16 NOTE — Consult Note (Signed)
[facility] CM Primary Care Navigator  08/16/2017  ARMENTHA BRANAGAN [birth year] [id number]   Met with patient (very pleasant) and daughter [name]) in the room to identify possible discharge needs.  Daughter reports that patient was having shortness of breath with decreased oxygen level and coughing thathad led to this admission. Patient's daughter endorsesDr. Sherren Mocha McDiarmid with [place] as the primary care provider .   [facility] [address] [facility].   Patient's daughter has been managing her medications at home with use of "pill box" system filled weekly.   Daughters [name] and [name]) are providing transportation to herdoctors'appointments.   Patient's daughter- [name] is the primary caregiver and patient has 24/7 caregivers at home who assist with her care needs.  Anticipated discharge plan is home according to daughter.  Patientand daughter voiced understanding to call primary care provider's officewhen shereturns home,for a post discharge follow-up appointment within a week or sooner if needs arise.Patient letter (with PCP's contact number) was provided as their reminder.   Explained to patient and daughter regarding [facility] CM services available for health management but both deniedany currentneeds or concerns at this time.  Daughter states that patient is being well managed and care needs are provided at home between her and caregivers.  Patient pleasantly expressed decline to EMMI calls forfollow-up with her recovery (states "I have more than enough people to follow-up with me including my doctors" and " I do not want to get overwhelmed") but both patient and daughter expressed understanding to seekreferral to [facility] care managementfrom primary care provider if deemed necessary and appropriatefor servicesin the future.   [facility] care management information provided  for future needs that may arise.   For questions, please contact:  Dannielle Huh, BSN, RN- [facility] Primary Care Navigator  Telephone: [phone] [place]

## 2017-08-16 NOTE — Progress Notes (Signed)
ANTICOAGULATION CONSULT NOTE - Follow Up Consult  Pharmacy Consult for Heparin Indication: rule out PE  Allergies  Allergen Reactions  . Baclofen Other (See Comments)    Severe altered mental status, extreme sedation   . Levaquin [Levofloxacin] Anaphylaxis  . Penicillins Anaphylaxis    Has patient had a PCN reaction causing immediate rash, facial/tongue/throat swelling, SOB or lightheadedness with hypotension: Yes Has patient had a PCN reaction causing severe rash involving mucus membranes or skin necrosis: No Has patient had a PCN reaction that required hospitalization No Has patient had a PCN reaction occurring within the last 10 years: No If all of the above answers are "NO", then may proceed with Cephalosporin use.   . Sulfonamide Derivatives Anaphylaxis    Patient Measurements: Height: 5' 5.5" (166.4 cm) Weight: 132 lb 4.4 oz (60 kg) IBW/kg (Calculated) : 58.15 Heparin Dosing Weight: 60 kg  Vital Signs: Temp: 98.5 F (36.9 C) (09/21 1204) Temp Source: Oral (09/21 1204) BP: 113/65 (09/21 1204) Pulse Rate: 83 (09/21 1204)  Labs:  Recent Labs  08/15/17 1128 08/15/17 2026 08/15/17 2343 08/16/17 0139 08/16/17 0758 08/16/17 1011  HGB 9.4* 9.2*  --  9.1*  --   --   HCT 30.4* 29.4*  --  29.1*  --   --   PLT 186 181  --  172  --   --   HEPARINUNFRC  --   --   --  0.33  --  0.42  CREATININE 2.29* 2.14*  --  2.13*  --   --   TROPONINI 0.10* 0.38* 0.46*  --  0.32*  --     Estimated Creatinine Clearance: 14.5 mL/min (A) (by C-G formula based on SCr of 2.13 mg/dL (H)).  Assessment:   Heparin level remains therapeutic (0.42) on 1000 units/hr.  VQ scan pending.  Goal of Therapy:  Heparin level 0.3-0.7 units/ml Monitor platelets by anticoagulation protocol: Yes   Plan:   Continue heparin drip at 1000 units/hr  Daily heparin level and CBC while on heparin.  Follow up VQ scan and anticoagulation plans.  Arty Baumgartner, [place] Pager: [phone] 08/16/2017,1:35  PM

## 2017-08-16 NOTE — Plan of Care (Signed)
Problem: Health Behavior/Discharge Planning: Goal: Ability to manage health-related needs will improve for discharge Outcome: Progressing Patient has 24 hour private caretakers at home.   Problem: Activity: Goal: Risk for activity intolerance will decrease Outcome: Progressing PT/OT consulted.

## 2017-08-16 NOTE — Progress Notes (Signed)
ANTICOAGULATION CONSULT NOTE - F/u Consult  Pharmacy Consult for heparin Indication: rule out PE  Allergies  Allergen Reactions  . Baclofen Other (See Comments)    Severe altered mental status, extreme sedation   . Levaquin [Levofloxacin] Anaphylaxis  . Penicillins Anaphylaxis    Has patient had a PCN reaction causing immediate rash, facial/tongue/throat swelling, SOB or lightheadedness with hypotension: Yes Has patient had a PCN reaction causing severe rash involving mucus membranes or skin necrosis: No Has patient had a PCN reaction that required hospitalization No Has patient had a PCN reaction occurring within the last 10 years: No If all of the above answers are "NO", then may proceed with Cephalosporin use.   . Sulfonamide Derivatives Anaphylaxis    Patient Measurements: Height: 5' 5.5" (166.4 cm) Weight: 132 lb 4.4 oz (60 kg) IBW/kg (Calculated) : 58.15 Heparin Dosing Weight: 62.1 kg  Vital Signs: Temp: 97.7 F (36.5 C) (09/21 0110) Temp Source: Oral (09/21 0110) BP: 120/50 (09/21 0110) Pulse Rate: 52 (09/21 0110)  Labs:  Recent Labs  08/15/17 1128 08/15/17 2026 08/15/17 2343 08/16/17 0139  HGB 9.4* 9.2*  --  9.1*  HCT 30.4* 29.4*  --  29.1*  PLT 186 181  --  172  HEPARINUNFRC  --   --   --  0.33  CREATININE 2.29* 2.14*  --  2.13*  TROPONINI 0.10* 0.38* 0.46*  --     Estimated Creatinine Clearance: 14.5 mL/min (A) (by C-G formula based on SCr of 2.13 mg/dL (H)).   Medical History: Past Medical History:  Diagnosis Date  . Abdominal pain   . Acute on chronic congestive heart failure ([place])   . Acute respiratory distress   . Acute respiratory failure with hypoxia ([place])   . Anemia, unspecified 07/27/2014  . Aortic stenosis   . Bradycardia 01/18/2016  . Chronic diastolic CHF (congestive heart failure) ([place])   . Chronic respiratory failure (HCC)    a. on home O2 qhs and PRN.  . CKD (chronic kidney disease), stage IV ([place])    a. per review of historical  labs.  . Encephalopathy 02/03/2016  . Hematoma 05/11/2016  . Hip fracture ([place]) 11/09/2013  . Hypertension   . Hypothyroidism   . MDS (myelodysplastic syndrome) ([place]) 10/05/2011  . Mild cognitive impairment   . Moderate to severe pulmonary hypertension ([place])   . Neoplasm of uncertain behavior 7/51/7001  . Permanent atrial fibrillation ([place]) dx Mar. 2010   a. Not on anticoag due to myelodysplastic syndrome.  . Pneumonia and influenza   . Pressure injury of skin 01/01/2017  . Pulmonary hypertension ([place])   . RBBB   . Severe aortic stenosis     Assessment: 83 yo female admitted with generalized weakness and requiring more O2 than baseline. D-dimer elevated, plan for V/Q scan 9/21. Noted patient with history of myelodysplastic syndrome and therefore has risk for variable reductions in RBC and platelet production. Per cardiology note, patient has long history of afib and not on AC PTA.   Heparin level is therapeutic at 0.33. Hgb/platelets low, but stable and no s/s bleeding noted.   Goal of Therapy:  Heparin level 0.3-0.7 units/ml Monitor platelets by anticoagulation protocol: Yes   Plan:  Continue heparin gtt at 1000 units/hr Heparin level in 8 hrs to confirm Daily heparin level and CBC Monitor for s/s bleeding F/u VQ scan   Argie Ramming, PharmD Clinical Pharmacist 08/16/17 2:56 AM

## 2017-08-16 NOTE — Progress Notes (Signed)
DAILY PROGRESS NOTE   Patient Name: Gina Wilkinson Date of Encounter: 08/16/2017  Hospital Problem List   Principal Problem:   Acute diastolic (congestive) heart failure (HCC) Active Problems:   Aortic stenosis, Severe   Permanent atrial fibrillation (HCC)   CKD (chronic kidney disease), stage IV ([place])   Dyspnea    Chief Complaint   Breathing better today.   Subjective   Troponin elevated overnight - peaked at 0.46 then declined. This is likely Type II MI - given severe aortic stenosis and acute systolic congestive heart failure. V/Q scan is pending, however, low to intermediate pre-test probability of PE. Diuresed about 500 cc recorded overnight. Weight 132 lbs today. Echo is pending. Creatinine improved today. Met with sister and Romona Curls, NP with palliative care and participated in discussion today.  Objective   Vitals:   08/16/17 0000 08/16/17 0110 08/16/17 0429 08/16/17 0852  BP: (!) 127/42 (!) 120/50 (!) 102/58 94/68  Pulse: (!) 31 (!) 52 75 83  Resp: _0 (!) 22  Temp:  97.7 F (36.5 C) 97.6 F (36.4 C) 97.9 F (36.6 C)  TempSrc:  Oral Oral Oral  SpO2: 96% 91% 96% 95%  Weight:  132 lb 4.4 oz (60 kg)    Height:  5' 5.5" (1.664 m)      Intake/Output Summary (Last 24 hours) at 08/16/17 1059 Last data filed at 08/16/17 0800  Gross per 24 hour  Intake            257.5 ml  Output              850 ml  Net           -592.5 ml   Filed Weights   08/16/17 0110  Weight: 132 lb 4.4 oz (60 kg)    Physical Exam   General appearance: alert and no distress Neck: no carotid bruit, no JVD and thyroid not enlarged, symmetric, no tenderness/mass/nodules Lungs: diminished breath sounds bilaterally Heart: regular rate and rhythm, S1, S2 normal and systolic murmur: systolic ejection 3/6, crescendo and late-peaking at 2nd right intercostal space Abdomen: soft, non-tender; bowel sounds normal; no masses,  no organomegaly Extremities: extremities normal,  atraumatic, no cyanosis or edema Pulses: 2+ and symmetric Skin: Skin color, texture, turgor normal. No rashes or lesions Neurologic: Mental status: Awake, knows she is at the hospital Psych: Pleasant  Inpatient Medications    Scheduled Meds: . aspirin EC  81 mg Oral Daily  . cholecalciferol  5,000 Units Oral Daily  . levothyroxine  88 mcg Oral QAC breakfast  . mouth rinse  15 mL Mouth Rinse BID  . sodium chloride flush  3 mL Intravenous Q12H    Continuous Infusions: . sodium chloride    . heparin 1,000 Units/hr (08/16/17 0600)    PRN Meds: sodium chloride, acetaminophen, ondansetron (ZOFRAN) IV, sodium chloride flush   Labs   Results for orders placed or performed during the hospital encounter of 08/15/17 (from the past 48 hour(s))  Basic metabolic panel     Status: Abnormal   Collection Time: 08/15/17 11:28 AM  Result Value Ref Range   Sodium 137 135 - 145 mmol/L   Potassium 4.7 3.5 - 5.1 mmol/L   Chloride 102 101 - 111 mmol/L   CO2 24 22 - 32 mmol/L   Glucose, Bld 147 (H) 65 - 99 mg/dL   BUN 33 (H) 6 - 20 mg/dL   Creatinine, Ser 2.29 (H) 0.44 - 1.00 mg/dL  Calcium 9.2 8.9 - 10.3 mg/dL   GFR calc non Af Amer 17 (L) >60 mL/min   GFR calc Af Amer 20 (L) >60 mL/min    Comment: (NOTE) The eGFR has been calculated using the CKD EPI equation. This calculation has not been validated in all clinical situations. eGFR's persistently <60 mL/min signify possible Chronic Kidney Disease.    Anion gap 11 5 - 15  CBC with Differential     Status: Abnormal   Collection Time: 08/15/17 11:28 AM  Result Value Ref Range   WBC 6.2 4.0 - 10.5 K/uL   RBC 3.38 (L) 3.87 - 5.11 MIL/uL   Hemoglobin 9.4 (L) 12.0 - 15.0 g/dL   HCT 30.4 (L) 36.0 - 46.0 %   MCV 89.9 78.0 - 100.0 fL   MCH 27.8 26.0 - 34.0 pg   MCHC 30.9 30.0 - 36.0 g/dL   RDW 17.7 (H) 11.5 - 15.5 %   Platelets 186 150 - 400 K/uL   Neutrophils Relative % 89 %   Neutro Abs 5.5 1.7 - 7.7 K/uL   Lymphocytes Relative 7 %    Lymphs Abs 0.4 (L) 0.7 - 4.0 K/uL   Monocytes Relative 4 %   Monocytes Absolute 0.3 0.1 - 1.0 K/uL   Eosinophils Relative 0 %   Eosinophils Absolute 0.0 0.0 - 0.7 K/uL   Basophils Relative 0 %   Basophils Absolute 0.0 0.0 - 0.1 K/uL  Troponin I     Status: Abnormal   Collection Time: 08/15/17 11:28 AM  Result Value Ref Range   Troponin I 0.10 (HH) <0.03 ng/mL    Comment: CRITICAL RESULT CALLED TO, READ BACK BY AND VERIFIED WITH: C.ROWE,RN 1232 08/15/17 CLARK,S   Brain natriuretic peptide     Status: Abnormal   Collection Time: 08/15/17 11:28 AM  Result Value Ref Range   B Natriuretic Peptide 969.2 (H) 0.0 - 100.0 pg/mL  D-dimer, quantitative (not at [facility])     Status: Abnormal   Collection Time: 08/15/17  4:07 PM  Result Value Ref Range   D-Dimer, Quant 2.23 (H) 0.00 - 0.50 ug/mL-FEU    Comment: (NOTE) At the manufacturer cut-off of 0.50 ug/mL FEU, this assay has been documented to exclude PE with a sensitivity and negative predictive value of 97 to 99%.  At this time, this assay has not been approved by the FDA to exclude DVT/VTE. Results should be correlated with clinical presentation.   Urinalysis, Routine w reflex microscopic     Status: Abnormal   Collection Time: 08/15/17  4:33 PM  Result Value Ref Range   Color, Urine YELLOW YELLOW   APPearance CLEAR CLEAR   Specific Gravity, Urine 1.011 1.005 - 1.030   pH 5.0 5.0 - 8.0   Glucose, UA NEGATIVE NEGATIVE mg/dL   Hgb urine dipstick NEGATIVE NEGATIVE   Bilirubin Urine NEGATIVE NEGATIVE   Ketones, ur NEGATIVE NEGATIVE mg/dL   Protein, ur NEGATIVE NEGATIVE mg/dL   Nitrite NEGATIVE NEGATIVE   Leukocytes, UA LARGE (A) NEGATIVE   RBC / HPF 0-5 0 - 5 RBC/hpf   WBC, UA 6-30 0 - 5 WBC/hpf   Bacteria, UA RARE (A) NONE SEEN   Squamous Epithelial / LPF NONE SEEN NONE SEEN   Mucus PRESENT   Creatinine, urine, random     Status: None   Collection Time: 08/15/17  4:33 PM  Result Value Ref Range   Creatinine, Urine 105.69 mg/dL    CBC     Status: Abnormal   Collection  Time: 08/15/17  8:26 PM  Result Value Ref Range   WBC 7.0 4.0 - 10.5 K/uL   RBC 3.27 (L) 3.87 - 5.11 MIL/uL   Hemoglobin 9.2 (L) 12.0 - 15.0 g/dL   HCT 29.4 (L) 36.0 - 46.0 %   MCV 89.9 78.0 - 100.0 fL   MCH 28.1 26.0 - 34.0 pg   MCHC 31.3 30.0 - 36.0 g/dL   RDW 17.7 (H) 11.5 - 15.5 %   Platelets 181 150 - 400 K/uL  Creatinine, serum     Status: Abnormal   Collection Time: 08/15/17  8:26 PM  Result Value Ref Range   Creatinine, Ser 2.14 (H) 0.44 - 1.00 mg/dL   GFR calc non Af Amer 18 (L) >60 mL/min   GFR calc Af Amer 21 (L) >60 mL/min    Comment: (NOTE) The eGFR has been calculated using the CKD EPI equation. This calculation has not been validated in all clinical situations. eGFR's persistently <60 mL/min signify possible Chronic Kidney Disease.   Troponin I     Status: Abnormal   Collection Time: 08/15/17  8:26 PM  Result Value Ref Range   Troponin I 0.38 (HH) <0.03 ng/mL    Comment: CRITICAL VALUE NOTED.  VALUE IS CONSISTENT WITH PREVIOUSLY REPORTED AND CALLED VALUE.  Troponin I     Status: Abnormal   Collection Time: 08/15/17 11:43 PM  Result Value Ref Range   Troponin I 0.46 (HH) <0.03 ng/mL    Comment: CRITICAL VALUE NOTED.  VALUE IS CONSISTENT WITH PREVIOUSLY REPORTED AND CALLED VALUE.  Heparin level (unfractionated)     Status: None   Collection Time: 08/16/17  1:39 AM  Result Value Ref Range   Heparin Unfractionated 0.33 0.30 - 0.70 IU/mL    Comment:        IF HEPARIN RESULTS ARE BELOW EXPECTED VALUES, AND PATIENT DOSAGE HAS BEEN CONFIRMED, SUGGEST FOLLOW UP TESTING OF ANTITHROMBIN III LEVELS.   CBC     Status: Abnormal   Collection Time: 08/16/17  1:39 AM  Result Value Ref Range   WBC 5.9 4.0 - 10.5 K/uL   RBC 3.27 (L) 3.87 - 5.11 MIL/uL   Hemoglobin 9.1 (L) 12.0 - 15.0 g/dL   HCT 29.1 (L) 36.0 - 46.0 %   MCV 89.0 78.0 - 100.0 fL   MCH 27.8 26.0 - 34.0 pg   MCHC 31.3 30.0 - 36.0 g/dL   RDW 18.0 (H) 11.5 - 15.5 %    Platelets 172 150 - 400 K/uL  Basic metabolic panel     Status: Abnormal   Collection Time: 08/16/17  1:39 AM  Result Value Ref Range   Sodium 137 135 - 145 mmol/L   Potassium 4.3 3.5 - 5.1 mmol/L   Chloride 101 101 - 111 mmol/L   CO2 28 22 - 32 mmol/L   Glucose, Bld 104 (H) 65 - 99 mg/dL   BUN 33 (H) 6 - 20 mg/dL   Creatinine, Ser 2.13 (H) 0.44 - 1.00 mg/dL   Calcium 9.1 8.9 - 10.3 mg/dL   GFR calc non Af Amer 19 (L) >60 mL/min   GFR calc Af Amer 22 (L) >60 mL/min    Comment: (NOTE) The eGFR has been calculated using the CKD EPI equation. This calculation has not been validated in all clinical situations. eGFR's persistently <60 mL/min signify possible Chronic Kidney Disease.    Anion gap 8 5 - 15  Troponin I     Status: Abnormal   Collection Time: 08/16/17  7:58 AM  Result Value Ref Range   Troponin I 0.32 (HH) <0.03 ng/mL    Comment: CRITICAL VALUE NOTED.  VALUE IS CONSISTENT WITH PREVIOUSLY REPORTED AND CALLED VALUE.    ECG   A-fib with PVC's at 9 - Personally Reviewed  Telemetry   Afib with slow VR - Personally Reviewed  Radiology    Dg Chest Port 1 View  Result Date: 08/15/2017 CLINICAL DATA:  Shortness of Breath EXAM: PORTABLE CHEST 1 VIEW COMPARISON:  12/30/2016 FINDINGS: Cardiac shadow is again enlarged. Aortic calcifications are again noted. The hiatal hernia is again seen and predominately air filled. Diffuse interstitial changes are seen within both lungs although a slight increase in right pleural effusion is noted. These have been stable since 2014 and likely of a chronic nature with the exception of the new right pleural effusion. Mild vascular congestion is noted. IMPRESSION: Chronic appearing changes bilaterally. Mild vascular congestion is noted with a new right pleural effusion. Electronically Signed   By: Inez Catalina M.D.   On: 08/15/2017 11:16    Cardiac Studies   Echo pending  Assessment   1. Principal Problem: 2.   Acute diastolic  (congestive) heart failure ([place]) 3. Active Problems: 4.   Aortic stenosis, Severe 5.   Permanent atrial fibrillation ([place]) 6.   CKD (chronic kidney disease), stage IV ([place]) 7.   Dyspnea 8.   Plan   1. Breathing is better with diuresis. Small troponin elevation consistent with demand ischemia. No plans for cath - would continue IV Heparin until V/Q scan results. If high probability, would treat with Eliquis for 3 months. No surgical options for severe aortic stenosis. Palliative care is very reasonable.  Time Spent Directly with Patient:  I have spent a total of 15 minutes with the patient reviewing hospital notes, telemetry, EKGs, labs and examining the patient as well as establishing an assessment and plan that was discussed personally with the patient. > 50% of time was spent in direct patient care.  Length of Stay:  LOS: 1 day   Pixie Casino, MD, [place]  Attending Cardiologist  Direct Dial: [phone]  Fax: [phone]  Website:  [email or web address] 08/16/2017, 10:59 AM

## 2017-08-16 NOTE — Progress Notes (Signed)
Family Medicine Teaching Service Daily Progress Note Intern Pager: [phone]  Patient name: Gina Wilkinson Medical record number: [id number] Date of birth: [birth year] Age: 83 y.o. Gender: female  Primary Care Provider: McDiarmid, Blane Ohara, MD Consultants: Cardiology Code Status: DNR  Pt Overview and Major Events to Date:  9/20 - EKG 9/20 - CXR 9/20 - V/Q scan  Assessment and Plan:  Gina Wilkinson is a 83 y.o. female presenting with Dyspnea and fatigue that started on the evening of 9/19. PMH is significant for HFpEF, Aortic Stenosis, Chronic Afib, CKD IV, Pulm artery HTN, Chronic Resp Failure, myelodysplastic syndrome, and anemia   Acute Exacerbation of HFpEF 2/2 Worsening Aortic Stenosis: Stabilizing. Patient is likely presenting with acute CHF exacerbation vs PE vs worsening aortic stenosis and pulmonary HTN. Supporting evidence is presentation, crackles on exam, with CXR showing mild congestive changes with a right pleural effusion. She also has chronic respiratory failure (likley from pulmonary HTN) and this could be a contributing factor to her current condition or possibly the main driving force. ECHO in 12/2016 65-70%, heavily calcified with severe stenosis, mild mitral valve stenosis and regurgitation, PA peak pressuer 5mmHg. Worsening chronic anemia even less likely to be main cause of current symptoms but possible. EKG is with Afib (bradycardia) with RBBB which is unchanged from prior. Troponin I uptrending (lower than prior and likely due to demand ischemia). Currently stable on venturi mask.  - s/p IV lasix 40mg  x 1 in the ED. Will monitor without further doses. Do not want to over do diuresis with her severe aortic stenosis.  - daily weights (no weight today) - strict I/O: 9/21: In - 565mL, Out 1.3L Wt 132.4 (dry weight 136) - CBC in am - V/Q scan order as D-dimer is elevated. Will start IV heparin empirically until we can rule out PE. Close monitoring since she does have MDS.    Aortic Stenosis - repeat ECHO - Cardiology recs that she is not a candidate for TAVR or SAVR due to renal insufficiency, memory loss, and age -  EKG is with Afib (bradycardia) with RBBB which is unchanged from prior. - with her multiple comorbidities likely contributing to her symptoms (severe aortic stenosis/pulm HTN) and worsened creatinine with increased dose of PO lasix will consult palliative care for [place] discussion - continue Venturi mask and wean O2 as able  Acute Hypoxic Respiratory Failure: Improving. Likely secondary to worsening CHF due to worsening aortic stenosis. Patient was admitted on 6L of O2 and had sats in the low 70s-mid 80s. Now sats are 97% at 4L. Normally requires 2L of home O2. - Continue 4L of O2 and slowly wean back to home O2 levels if sats remain stable.  AKI on CKD4: Patient has baseline Cr of 1.5-1.8. At presentation her Cr is 2.29 Likely prerenal as patient plus combination of increased use of Lasix over the last few weeks. She is likely intravascularly depleted even though her BNP is elevated. Patient has reported no issues with urination so unlikely postrenal. - Continue to gently diuresis if needed with Lasix to help with pulmonary edema - BMP in am - strict I/O  - avoid other nephrotoxic agents  Hypothyroidism: Stable. She has been taking her home meds as she has help with an at home caregiver. Last TSH 1.671 in 05/2017 - Continue Synthroid 55mcg  Atrial Fibrillation, chronic: with Bradycardia (which is intermittent and chronic for patient). Not on beta blocker due to this. Due to MDS and anemia, not on anticoagulation  as an outpatient.  - Patient has unchanged EKG from previous and is asymptomatic.   Myleodysplastic Syndrome: Stable. Followed by Oncology. Patient has been taking Darbepoetin Alfa-Albumin for several years. - Follow up with Oncology outpatient to resume treatment  Pulmonary HTN: ECHO in 12/2016 65-70%, heavily calcified with severe  stenosis, mild mitral valve stenosis and regurgitation, PA peak pressuer 57mmHg. . - plan as noted above  FEN/GI: heart healthy diet PPx: heparin  Disposition: watcher, prognosis guarded  Subjective:  Mrs Gina Wilkinson is a 83y/o female with PMH of HFpEF, Aortic Stenosis, Chronic Afib, CKD IV, Pulm artery HTN, Chronic Resp Failure, myelodysplastic syndrome, and anemia who presented with dyspnea and fatigue.  This morning she states she feels fine and has no complaints. She is still requiring O2 at 4L. She is pleasant and states she is not having any chest pain or difficulty breathing. No shortness of breath. She does complain of being a little congested.  Objective: Temp:  [97.6 F (36.4 C)-97.7 F (36.5 C)] 97.6 F (36.4 C) (09/21 0429) Pulse Rate:  [31-96] 75 (09/21 0429) Resp:  [17-29] 20 (09/21 0429) BP: (102-177)/(42-90) 102/58 (09/21 0429) SpO2:  [68 %-100 %] 96 % (09/21 0429) FiO2 (%):  [50 %] 50 % (09/20 1135) Weight:  [132 lb 4.4 oz (60 kg)] 132 lb 4.4 oz (60 kg) (09/21 0110) Physical Exam: General: Alert, Oriented to person and place but struggles with time Cardiovascular: RRR, 3/6 systolic murmur best heard at RUSB, Normal S1, S2 Respiratory: bibasilar crackles, no wheezing, no rhonchi Abdomen: non-distended, soft, non-tender, +BS in all quadrants Extremities: no cyanosis, trace edema of lower extremities  Laboratory:  Recent Labs Lab 08/15/17 1128 08/15/17 2026 08/16/17 0139  WBC 6.2 7.0 5.9  HGB 9.4* 9.2* 9.1*  HCT 30.4* 29.4* 29.1*  PLT 186 181 172    Recent Labs Lab 08/15/17 1128 08/15/17 2026 08/16/17 0139  NA 137  --  137  K 4.7  --  4.3  CL 102  --  101  CO2 24  --  28  BUN 33*  --  33*  CREATININE 2.29* 2.14* 2.13*  CALCIUM 9.2  --  9.1  GLUCOSE 147*  --  104*   9/20 - BNP: 969.2 9/20 - Troponin I: 0.10, 0.38, 0.46 9/20 - U/A: large number of leukocytes and bacteria, no nitrites 9/20 - D-dimer: 2.23 9/20 - Urine Creatinine:  105.69 9/20 - Urea nitrogen: pending 9/21 - Repeat Troponin I: pending  Imaging/Diagnostic Tests: 9/20 - CXR: IMPRESSION: Chronic appearing changes bilaterally. Mild vascular congestion is noted with a new right pleural effusion. 9/21 - V/Q scan: pending  9/21 - Echocardiogram: pending  Nuala Alpha, DO 08/16/2017, 6:39 AM PGY-1, [place] Intern pager: [phone], text pages welcome

## 2017-08-16 NOTE — Care Management Note (Signed)
Case Management Note Marvetta Gibbons RN, BSN Unit 4E-Case Manager [phone]  Patient Details  Name: [name] MRN: [id number] Date of Birth: [birth year]  Subjective/Objective:   Pt admitted with HF/pulm. edema               Action/Plan: PTA pt lived at home - has 24/7 caregivers, [facility] consulted for [place]. CM to follow for d/c needs.   Expected Discharge Date:                  Expected Discharge Plan:  [place]  In-House Referral:     Discharge planning Services  CM Consult  Post Acute Care Choice:  Home Health Choice offered to:     DME Arranged:    DME Agency:     HH Arranged:    [place] Agency:     Status of Service:  In process, will continue to follow  If discussed at Long Length of Stay Meetings, dates discussed:    Discharge Disposition:   Additional Comments:  Dawayne Patricia, RN 08/16/2017, 5:16 PM

## 2017-08-16 NOTE — Consult Note (Addendum)
Consultation Note Date: 08/16/2017   Patient Name: Gina Wilkinson  DOB: 10/25/1920  MRN: [id number]  Age / Sex: 83 y.o., female  PCP: McDiarmid, Blane Ohara, MD Referring Physician: Zenia Resides, MD  Reason for Consultation: Establishing goals of care and Psychosocial/spiritual support  HPI/Patient Profile: 83 y.o. female  with past medical history of Atrial fibrillation, myelodysplastic syndrome, chronic kidney disease stage IV, aortic stenosis, pulmonary hypertension, anemia admitted on 08/15/2017 with increased shortness of breath, weakness. Patient's creatinine on admission was 2.29. She was found to have a urinary tract infection. Also her d-dimer was elevated and she was started on IV heparin prophylactically. She is scheduled for a VQ scan today. Her aortic stenosis appears to be worsening and she is not a surgical candidate.  Consults ordered for goals of care. She has not seen a palliative medicine provider in the past.   Clinical Assessment and Goals of Care: Patient lives independently with 24/ 7 caregivers. She has 2 daughters, Gina Wilkinson, and Gina Wilkinson [name] is her healthcare power of attorney). Patient also had a son that died in 03-17-2001 from cancer and then 9 months later her husband died of leukemia. [name] describes her mother as stoic, and someone who has defied odds in the past. [name] reports that she can see a decline and does seem to have a good understanding of patient's disease progression in terms of her worsening aortic stenosis pulmonary hypertension and chronic kidney disease stage IV.  [name] shares with me that in in 17-Mar-2009 she was diagnosed with myelodysplastic syndrome and was undergoing transfusions. These had to be stopped shortly thereafter because of her heart failure and subsequent volume overload. She has not been on anticoagulation for her a-fib. [name] also shares that her mother has  been calling out for the [place].  Patient's healthcare power of attorney, is her daughter Gina Wilkinson [phone]. Patient is alert and oriented and can speak to some issues herself but does need help with complicated decision-making as she has mild dementia    SUMMARY OF RECOMMENDATIONS   Confirm DNR/DNI Upon discharge, palliative medicine to continue goals of care discussion in the home. Office will help to facilitate this referral Plan to call patient's other daughter, Gina Wilkinson, to arrange a meeting this weekend to discuss palliative care, and other issues pertinent to patient and family goals Both daughters are aware that [name] is not a surgical candidate for a TAVR, and would have medical management going forward such as with diuretics. Patient however is rapidly reaching a point with her kidney disease that she's having limited response  Addendum 1700: Spoke to Gina Wilkinson, [name], who deferred Palliative Mtg for now; "If you don't hear from me in 3 days, call me back". We can do this, but it does not sound as though she is receptive for palliative FU in the home at this point. Feels that physicians have kept her informed and has no further needs. She did think I was from hospice and quickly stated "were not there  yet".   Code Status/Advance Care Planning:  DNR    Symptom Management:   Dyspnea: Continue with targeted pulmonary treatments, diuretics  Palliative Prophylaxis:   Aspiration, Bowel Regimen, Eye Care, Frequent Pain Assessment, Oral Care and Turn Reposition  Additional Recommendations (Limitations, Scope, Preferences):  Full Scope Treatment except for DO NOT RESUSCITATE DO NOT INTUBATE  Psycho-social/Spiritual:   Desire for further Chaplaincy support:no  Additional Recommendations: Referral to Community Resources   Prognosis:   < 6 months in the setting of systolic heart failure, pulmonary hypertension severe aortic stenosis (patient is not a surgical  candidate), chronic kidney disease stage IV. Patient likely will qualify for her hospice Medicare benefit and I did present this to her daughter [name]  Discharge Planning: Home with Home Health      Primary Diagnoses: Present on Admission: . Dyspnea . Acute diastolic (congestive) heart failure ([place]) . CKD (chronic kidney disease), stage IV ([place]) . Permanent atrial fibrillation ([place])   I have reviewed the medical record, interviewed the patient and family, and examined the patient. The following aspects are pertinent.  Past Medical History:  Diagnosis Date  . Abdominal pain   . Acute on chronic congestive heart failure ([place])   . Acute respiratory distress   . Acute respiratory failure with hypoxia ([place])   . Anemia, unspecified 07/27/2014  . Aortic stenosis   . Bradycardia 01/18/2016  . Chronic diastolic CHF (congestive heart failure) ([place])   . Chronic respiratory failure ([facility])    a. on home O2 qhs and PRN.  . CKD (chronic kidney disease), stage IV ([place])    a. per review of historical labs.  . Encephalopathy 02/03/2016  . Hematoma 05/11/2016  . Hip fracture ([place]) 11/09/2013  . Hypertension   . Hypothyroidism   . MDS (myelodysplastic syndrome) ([place]) 10/05/2011  . Mild cognitive impairment   . Moderate to severe pulmonary hypertension ([place])   . Neoplasm of uncertain behavior 5/63/8756  . Permanent atrial fibrillation ([place]) dx Mar. 2010   a. Not on anticoag due to myelodysplastic syndrome.  . Pneumonia and influenza   . Pressure injury of skin 01/01/2017  . Pulmonary hypertension ([place])   . RBBB   . Severe aortic stenosis    Social History   Social History  . Marital status: Widowed    Spouse name: N/A  . Number of children: 2  . Years of education: >16   Occupational History  . Chemist     retired   Social History Main Topics  . Smoking status: Never Smoker  . Smokeless tobacco: Never Used  . Alcohol use No  . Drug use: No  . Sexual activity: Not Asked   Other  Topics Concern  . None   Social History Narrative  . None   Family History  Problem Relation Age of Onset  . Appendicitis Mother        Died age 38s-60s of ruptured appendix   Scheduled Meds: . aspirin EC  81 mg Oral Daily  . cholecalciferol  5,000 Units Oral Daily  . levothyroxine  88 mcg Oral QAC breakfast  . mouth rinse  15 mL Mouth Rinse BID  . sodium chloride flush  3 mL Intravenous Q12H   Continuous Infusions: . sodium chloride    . heparin 1,000 Units/hr (08/16/17 0600)   PRN Meds:.sodium chloride, acetaminophen, ondansetron (ZOFRAN) IV, sodium chloride flush Medications Prior to Admission:  Prior to Admission medications   Medication Sig Start Date End Date Taking? Authorizing Provider  amLODipine (  NORVASC) 5 MG tablet TAKE 1 TABLET (5 MG TOTAL) BY MOUTH DAILY. 08/05/17  Yes McDiarmid, Blane Ohara, MD  aspirin 81 MG tablet Take 81 mg by mouth daily.   Yes [provider]  Cholecalciferol (VITAMIN D PO) Take 5,000 Units by mouth daily.   Yes [provider]  Cyanocobalamin (VITAMIN B-12 PO) Take 3,000 mg by mouth daily.   Yes [provider]  Darbepoetin Alfa-Albumin (ARANESP IJ) Inject as directed every 14 (fourteen) days.   Yes Gorsuch, Ni, MD  furosemide (LASIX) 20 MG tablet Take 10-20 mg by mouth See admin instructions. Alternate 10mg  and 20mg  every other day 07/17/17  Yes McDiarmid, Blane Ohara, MD  levothyroxine (SYNTHROID) 88 MCG tablet Take 1 tablet (88 mcg total) by mouth daily. Take on empty stomach. 05/01/17  Yes McDiarmid, Blane Ohara, MD  OXYGEN Inhale 2 L/min into the lungs. Use oxygen supplementation at least 16 hours per day   Yes [provider]  clindamycin (CLEOCIN) 300 MG capsule Take 300 mg by mouth 4 (four) times daily. For seven days (DENTAL APPOINTMENTS) 06/28/17   [provider]   Allergies  Allergen Reactions  . Baclofen Other (See Comments)    Severe altered mental status, extreme sedation   . Levaquin [Levofloxacin]  Anaphylaxis  . Penicillins Anaphylaxis    Has patient had a PCN reaction causing immediate rash, facial/tongue/throat swelling, SOB or lightheadedness with hypotension: Yes Has patient had a PCN reaction causing severe rash involving mucus membranes or skin necrosis: No Has patient had a PCN reaction that required hospitalization No Has patient had a PCN reaction occurring within the last 10 years: No If all of the above answers are "NO", then may proceed with Cephalosporin use.   . Sulfonamide Derivatives Anaphylaxis   Review of Systems  Unable to perform ROS: Dementia    Physical Exam  Constitutional: She is oriented to person, place, and time. She appears well-developed and well-nourished.  Elderly female in recliner. A/O x 3; no acute distress  HENT:  Head: Normocephalic and atraumatic.  Neck: Normal range of motion.  Cardiovascular:  Atrial fib  Pulmonary/Chest:  Mild increased work of breathing at rest  Neurological: She is alert and oriented to person, place, and time.  Skin: Skin is warm and dry. There is pallor.  Psychiatric: She has a normal mood and affect. Her behavior is normal.  Vitals reviewed.   Vital Signs: BP 94/68 (BP Location: Left Arm)   Pulse 83   Temp 97.9 F (36.6 C) (Oral)   Resp (!) 22   Ht 5' 5.5" (1.664 m)   Wt 60 kg (132 lb 4.4 oz)   SpO2 95%   BMI 21.68 kg/m  Pain Assessment: No/denies pain       SpO2: SpO2: 95 % O2 Device:SpO2: 95 % O2 Flow Rate: .O2 Flow Rate (L/min): 4 L/min (decreased at this time)  IO: Intake/output summary:  Intake/Output Summary (Last 24 hours) at 08/16/17 1128 Last data filed at 08/16/17 0800  Gross per 24 hour  Intake            257.5 ml  Output              850 ml  Net           -592.5 ml    LBM:   Baseline Weight: Weight: 60 kg (132 lb 4.4 oz) Most recent weight: Weight: 60 kg (132 lb 4.4 oz)     Palliative Assessment/Data:   Flowsheet Rows  Most Recent Value  Intake Tab  Referral  Department  Hospitalist  Unit at Time of Referral  Intermediate Care Unit  Palliative Care Primary Diagnosis  Pulmonary  Date Notified  08/15/17  Date of Admission  08/15/17  Date first seen by Palliative Care  08/16/17  # of days Palliative referral response time  1 Day(s)  # of days IP prior to Palliative referral  0  Clinical Assessment  Palliative Performance Scale Score  40%  Pain Max last 24 hours  0  Pain Min Last 24 hours  0  Dyspnea Max Last 24 Hours  0  Dyspnea Min Last 24 hours  0  Nausea Max Last 24 Hours  0  Nausea Min Last 24 Hours  0  Anxiety Max Last 24 Hours  0  Anxiety Min Last 24 Hours  0  Other Max Last 24 Hours  0  Psychosocial & Spiritual Assessment  Palliative Care Outcomes  Patient/Family meeting held?  Yes  Who was at the meeting?  pt and Page  Provided psychosocial or spiritual support, Counseled regarding hospice  Patient/Family wishes: Interventions discontinued/not started   Mechanical Ventilation, Trach, PEG  Palliative Care follow-up planned  Yes, Facility      Time In: 1000 Time Out: 1040 Time Total: 40 min Greater than 50%  of this time was spent counseling and coordinating care related to the above assessment and plan. Staffed with Dr. Debara Pickett  Signed by: Dory Horn, NP   Please contact Palliative Medicine Team phone at [phone] for questions and concerns.  For individual provider: See Shea Evans

## 2017-08-16 NOTE — Discharge Summary (Signed)
[facility] Discharge Summary  Patient name: Gina Wilkinson Medical record number: [id number] Date of birth: [birth year] Age: 83 y.o. Gender: female Date of Admission: 08/15/2017  Date of Discharge: 08/20/2017 Admitting Physician: Zenia Resides, MD  Primary Care Provider: McDiarmid, Blane Ohara, MD Consultants: Cardiology  Indication for Hospitalization:   Dyspnea with associated Fatigue Acute Hypoxic Respiratory Failure  Discharge Diagnoses/Problem List:   Acute Hypoxic Respiratory Failure  Disposition: guarded to poor  Discharge Condition: home care/hospice  Discharge Exam:   General: Awake, sitting up in bed, in NAD Cardiovascular: Irregularly irregular Rhythm, III/VI holosystolic murmur Respiratory: normal work of breathing with 2L [place] in place, CTAB, no crackles  Abdomen: non-distended, soft, non-tender, +BS in all quadrants Extremities: no cyanosis, no lower extremity edema.  Brief Hospital Course:  Gina Wilkinson 83y/o female with a PMH of HFpEF, Aortic Stenosis, Chronic Afib, CKD IV, Pulm artery HTN, Chronic Resp Failure, myelodysplastic syndrome, and anemia who presented with increased work of breathing and dyspnea at rest. She is usually on home O2 of 2L but on the night of 9/19 she required more O2 to maintain her breathing. In the morning of 9/20 she awoke and needed more oxygen and was at 4L and was still dyspneic. She also refused to get up out of bed that morning, which is abnormal for her. Her daughter called the clinic and she was advised to go to the ED. In the ED she was found to have O2 sats in the mid 70's to 80's and required 6L-10L of O2 to maintain her O2 sats above 90%. She was found to be tachycardic and had tachypnea. She also received a breathing treatment and a dose of 20mg  Lasix. On exam she had crackles in the lower lung fields bilaterally. Her CXR showed "chronic appearing changes bilaterally. Mild vascular congestion is  noted with a new right pleural effusion." Her BNP was 969.2. Her troponins were elevated but her EKG was unchanged from previous and it was determined her symptoms were likely an acute exacerbation of CHF due to worsening Aortic Stenosis. Due to an elevated d-dimer a V/Q scan was ordered to rule out PE. She was admitted to be watched over night. Cardiology informed the team she is not a surgical candidate for TAVR or SAVR. A palliative care consult was placed due to the poor prognosis of worsening aortic stenosis.  The next morning of 9/21 she had no complaints and was resting comfortably in her chair eating on 4L of O2 with no difficulty or increased work of breathing. She had no SOB and no chest pain. Gina Wilkinson remained comfortable over the weekend as we slowing weaned her back down to her home oxygen level. On discharge on 9/25 she was on 2L [place] of O2 and she had demonstrated good ability to ambulate with minimal assistance without desaturation. She had no complaints on discharge this morning and on physical exam her crackles had resolved.   Issues for Follow Up:  1. Discussion with family about goals of care and palliative care should be addressed. Family was made aware of inability to fix the underlying problem (HFpEF with severe Aortic Stenosis) and that treatment for her is mostly symptomatic relief. Family is understanding and patient is incredibly kind and a pleasure to manage. 2. We changed her Lasix to 20mg  oral every day. She will need close monitoring of her kidney function to ensure we are not over diuresing her.  Significant Procedures:   9/20 -  CXR: IMPRESSION: Chronic appearing changes bilaterally. Mild vascular congestion is noted with a new right pleural effusion.  9/21 - V/Q scan: No PE  9/21 - Echocardiogram: - Left ventricle: The cavity size was normal. Wall thickness was   increased in a pattern of moderate LVH. Systolic function was   vigorous. The estimated ejection  fraction was in the range of 65%   to 70%. Wall motion was normal; there were no regional wall   motion abnormalities. - Aortic valve: Severely calcified annulus. There was severe   stenosis. There was mild regurgitation. Valve area (VTI): 0.48   cm^2. Valve area (Vmax): 0.59 cm^2. Valve area (Vmean): 0.51   cm^2. - Mitral valve: Moderately calcified annulus. Moderately calcified   leaflets . There was mild regurgitation. - Left atrium: The atrium was moderately dilated. - Right atrium: The atrium was moderately to severely dilated. - Tricuspid valve: There was moderate-severe regurgitation. - Pulmonary arteries: Systolic pressure was severely increased. PA   peak pressure: 76 mm Hg  Significant Labs and Imaging:   Recent Labs Lab 08/16/17 0139 08/18/17 0720 08/19/17 0717  WBC 5.9 4.8 4.4  HGB 9.1* 9.7* 9.5*  HCT 29.1* 30.7* 31.1*  PLT 172 190 203    Recent Labs Lab 08/16/17 0139 08/17/17 0349 08/18/17 0720 08/19/17 0717 08/20/17 0403  NA 137 137 138 138 138  K 4.3 4.4 4.1 4.1 4.2  CL 101 101 102 100* 100*  CO2 28 29 29 30  32  GLUCOSE 104* 90 93 93 94  BUN 33* 32* 25* 25* 28*  CREATININE 2.13* 1.87* 1.56* 1.50* 1.47*  CALCIUM 9.1 8.8* 9.0 9.1 9.0  ALKPHOS  --  42  --   --   --   AST  --  19  --   --   --   ALT  --  12*  --   --   --   ALBUMIN  --  3.2*  --   --   --    9/20 - BNP: 969.2 9/20 - 9/21 - Troponin I: 0.10, 0.38, 0.46, 9/20 - U/A: large number of leukocytes and bacteria, no nitrites 9/20 - D-dimer: 2.23 9/20 - Urine Creatinine: 105.69 9/20 - Urea nitrogen: 284 9/21 - Repeat Troponin I: 0.32, 0.46, 0.38  Results/Tests Pending at Time of Discharge:   None  Discharge Medications:  Allergies as of 08/20/2017      Reactions   Baclofen Other (See Comments)   Severe altered mental status, extreme sedation    Levaquin [levofloxacin] Anaphylaxis   Penicillins Anaphylaxis   Has patient had a PCN reaction causing immediate rash, facial/tongue/throat  swelling, SOB or lightheadedness with hypotension: Yes Has patient had a PCN reaction causing severe rash involving mucus membranes or skin necrosis: No Has patient had a PCN reaction that required hospitalization No Has patient had a PCN reaction occurring within the last 10 years: No If all of the above answers are "NO", then may proceed with Cephalosporin use.   Sulfonamide Derivatives Anaphylaxis      Medication List    STOP taking these medications   clindamycin 300 MG capsule Commonly known as:  CLEOCIN     TAKE these medications   amLODipine 5 MG tablet Commonly known as:  NORVASC TAKE 1 TABLET (5 MG TOTAL) BY MOUTH DAILY.   ARANESP IJ Inject as directed every 14 (fourteen) days.   aspirin 81 MG tablet Take 81 mg by mouth daily.   fluticasone 50 MCG/ACT nasal spray Commonly known  as:  FLONASE Place 1 spray into both nostrils daily.   furosemide 20 MG tablet Commonly known as:  LASIX Take 1 tablet (20 mg total) by mouth daily. What changed:  how much to take  when to take this  additional instructions   levothyroxine 88 MCG tablet Commonly known as:  SYNTHROID Take 1 tablet (88 mcg total) by mouth daily. Take on empty stomach.   OXYGEN Inhale 2 L/min into the lungs. Use oxygen supplementation at least 16 hours per day   VITAMIN B-12 PO Take 3,000 mg by mouth daily.   VITAMIN D PO Take 5,000 Units by mouth daily.            Discharge Care Instructions        Start     Ordered   08/21/17 0000  fluticasone (FLONASE) 50 MCG/ACT nasal spray  Daily     08/20/17 1239   08/21/17 0000  furosemide (LASIX) 20 MG tablet  Daily     08/20/17 1239   08/20/17 0000  Increase activity slowly     08/20/17 1239   08/20/17 0000  Diet - low sodium heart healthy     08/20/17 1239   08/20/17 0000  Call MD for:  extreme fatigue     08/20/17 1239      Discharge Instructions: Please refer to Patient Instructions section of EMR for full details.  Patient was  counseled important signs and symptoms that should prompt return to medical care, changes in medications, dietary instructions, activity restrictions, and follow up appointments.   Follow-Up Appointments:   Nuala Alpha, DO 08/20/2017, 9:59 PM PGY-1, [place]

## 2017-08-17 ENCOUNTER — Inpatient Hospital Stay (HOSPITAL_COMMUNITY): Payer: Medicare Other

## 2017-08-17 DIAGNOSIS — I352 Nonrheumatic aortic (valve) stenosis with insufficiency: Secondary | ICD-10-CM

## 2017-08-17 DIAGNOSIS — I361 Nonrheumatic tricuspid (valve) insufficiency: Secondary | ICD-10-CM

## 2017-08-17 LAB — ECHOCARDIOGRAM COMPLETE
AOVTI: 119 cm
AV Area VTI index: 0.28 cm2/m2
AV Area VTI: 0.59 cm2
AV Area mean vel: 0.51 cm2
AV Mean grad: 62 mmHg
AV area mean vel ind: 0.3 cm2/m2
AV peak Index: 0.35
AV vel: 0.48
AVA: 0.48 cm2
AVCELMEANRAT: 0.23
AVLVOTPG: 7 mmHg
AVPG: 102 mmHg
AVPKVEL: 505 cm/s
Ao pk vel: 0.26 m/s
Ao-asc: 32 cm
CHL CUP MV DEC (S): 229
CHL CUP PV REG GRAD DIAS: 9 mmHg
CHL CUP RV SYS PRESS: 76 mmHg
CHL CUP TV REG PEAK VELOCITY: 391 cm/s
DOP CAL AO MEAN VELOCITY: 366 cm/s
EWDT: 229 ms
FS: 22 % — AB (ref 28–44)
Height: 65.5 in
IVS/LV PW RATIO, ED: 1.2
LA ID, A-P, ES: 47 mm
LA diam index: 2.78 cm/m2
LAVOL: 94.1 mL
LAVOLA4C: 96.5 mL
LAVOLIN: 55.6 mL/m2
LEFT ATRIUM END SYS DIAM: 47 mm
LVOT VTI: 25.4 cm
LVOT area: 2.27 cm2
LVOT diameter: 17 mm
LVOT peak VTI: 0.21 cm
LVOTPV: 132 cm/s
LVOTSV: 58 mL
MVPG: 11 mmHg
MVPKEVEL: 166 m/s
PV Reg vel dias: 152 cm/s
PW: 12 mm — AB (ref 0.6–1.1)
RV TAPSE: 18 mm
TR max vel: 391 cm/s
Valve area index: 0.28
Weight: 2176.38 oz

## 2017-08-17 LAB — COMPREHENSIVE METABOLIC PANEL
ALK PHOS: 42 U/L (ref 38–126)
ALT: 12 U/L — AB (ref 14–54)
ANION GAP: 7 (ref 5–15)
AST: 19 U/L (ref 15–41)
Albumin: 3.2 g/dL — ABNORMAL LOW (ref 3.5–5.0)
BILIRUBIN TOTAL: 1.2 mg/dL (ref 0.3–1.2)
BUN: 32 mg/dL — ABNORMAL HIGH (ref 6–20)
CO2: 29 mmol/L (ref 22–32)
CREATININE: 1.87 mg/dL — AB (ref 0.44–1.00)
Calcium: 8.8 mg/dL — ABNORMAL LOW (ref 8.9–10.3)
Chloride: 101 mmol/L (ref 101–111)
GFR, EST AFRICAN AMERICAN: 25 mL/min — AB (ref >60)
GFR, EST NON AFRICAN AMERICAN: 22 mL/min — AB (ref >60)
Glucose, Bld: 90 mg/dL (ref 65–99)
Potassium: 4.4 mmol/L (ref 3.5–5.1)
Sodium: 137 mmol/L (ref 135–145)
TOTAL PROTEIN: 6.6 g/dL (ref 6.5–8.1)

## 2017-08-17 MED ORDER — ENSURE ENLIVE PO LIQD
237.0000 mL | Freq: Two times a day (BID) | ORAL | Status: DC
  Administered 2017-08-17 (×2): 237 mL via ORAL

## 2017-08-17 MED ORDER — FUROSEMIDE 10 MG/ML IJ SOLN
20.0000 mg | Freq: Once | INTRAMUSCULAR | Status: AC
  Administered 2017-08-17: 20 mg via INTRAVENOUS
  Filled 2017-08-17: qty 2

## 2017-08-17 MED ORDER — INFLUENZA VAC SPLIT HIGH-DOSE 0.5 ML IM SUSY
0.5000 mL | PREFILLED_SYRINGE | INTRAMUSCULAR | Status: DC
  Filled 2017-08-17: qty 0.5

## 2017-08-17 MED ORDER — DARBEPOETIN ALFA 100 MCG/0.5ML IJ SOSY
100.0000 ug | PREFILLED_SYRINGE | Freq: Once | INTRAMUSCULAR | Status: AC
  Administered 2017-08-17: 100 ug via SUBCUTANEOUS
  Filled 2017-08-17: qty 0.5

## 2017-08-17 NOTE — Progress Notes (Signed)
Family Medicine Teaching Service Daily Progress Note Intern Pager: [phone]  Patient name: Gina Wilkinson Medical record number: [id number] Date of birth: 06/20/1920 Age: 83 y.o. Gender: female  Primary Care Provider: McDiarmid, Blane Ohara, MD Consultants: Cardiology Code Status: DNR  Pt Overview and Major Events to Date:  9/20 - EKG 9/20 - CXR 9/20 - V/Q scan- low probability for PE  Assessment and Plan: Gina Wilkinson is a 83 y.o. female presenting with Dyspnea and fatigue that started on the evening of 9/19. PMH is significant for HFpEF, Aortic Stenosis, Chronic Afib, CKD IV, Pulm artery HTN, Chronic Resp Failure, myelodysplastic syndrome, and anemia   Acute Hypoxic Respiratory Failure due to Acute Exacerbation of HFpEF 2/2 Worsening Aortic Stenosis: Stable. On Aerosol mask at 5L O2 (uses 2L O2 at home) and had desaturations to the mid 80s on [place]. Weight up 4lbs from the ED, but currently at dry weight. UOP 1.05 L. Still with crackles in her lungs half way up her lungs this morning. - Will give another dose of IV Lasix 20mg  x 1. - daily weights - monitor I/O - ECHO pending - vitals per unit routine - wean O2 as able  Aortic Stenosis, severe - ECHO pending - Cardiology does not recommend surgery - Palliative consulted, appreciate recommendations. After discussion with [facility], did not seem that they were receptive to palliative f/u.  AKI on CKD4: Resolved. Cr peaked at 2.29 this admission, but is down to 1.87 (back to baseline of 1.5-1.8). FEUrea was 17.4%, consistent with pre-renal disease. - Will give another dose of Lasix 20mg  IV x 1 today and monitor kidney function. - Trend BMPs - strict I/O  - avoid other nephrotoxic agents  Anemia in chronic kidney disease: Hgb 9.1. May be contributing to SOB. Has gotten Aranesp injections as an outpatient in the past. - Recheck CBC tomorrow  Hypothyroidism: Stable. Recent TSH 05/2017 was 1.67. - Continue Synthroid 65mcg  Atrial  Fibrillation, chronic: stable. Not on beta blocker due to bradycardia and not a candidate for anticoagulation due to co-morbidities.  - Monitor  Myleodysplastic Syndrome: Stable. Followed by Oncology. Patient has been taking Darbepoetin Alfa-Albumin for several years. - Follow up with Oncology outpatient to resume treatment  Pulmonary HTN: ECHO in 12/2016 with PA peak pressure 59mmHg. - plan as noted above  Protein-calorie malnutrition: Albumin 3.2. - Start Ensure bid  FEN/GI: heart healthy diet PPx: heparin  Disposition: Plan for discharge home once stable on [place]. Still requiring Aerosol mask this morning. Likely in the next 1-2 days.  Subjective:  Patient states she is doing fine this morning. Her "breathing is not any worse". She denies any chest pain.  Objective: Temp:  [97.5 F (36.4 C)-98.5 F (36.9 C)] 97.5 F (36.4 C) (09/22 0522) Pulse Rate:  [41-126] 126 (09/22 0522) Resp:  [18-29] 18 (09/22 0522) BP: (94-127)/(65-74) 126/70 (09/22 0522) SpO2:  [91 %-98 %] 98 % (09/22 0522) Weight:  [136 lb 0.4 oz (61.7 kg)] 136 lb 0.4 oz (61.7 kg) (09/22 0522) Physical Exam: General: Awake, sitting up in bed, in NAD Cardiovascular: RRR, III/VI holosystolic murmur Respiratory: normal work of breathing with aerosol mask in place, crackles auscultated half way up the lungs bilaterally, occasional expiratory wheezing present. Abdomen: non-distended, soft, non-tender, +BS in all quadrants Extremities: no cyanosis, no lower extremity edema.  Laboratory:  Recent Labs Lab 08/15/17 1128 08/15/17 2026 08/16/17 0139  WBC 6.2 7.0 5.9  HGB 9.4* 9.2* 9.1*  HCT 30.4* 29.4* 29.1*  PLT 186 181  [place] Lab 08/15/17 1128 08/15/17 2026 08/16/17 0139 08/17/17 0349  NA 137  --  137 137  K 4.7  --  4.3 4.4  CL 102  --  101 101  CO2 24  --  28 29  BUN 33*  --  33* 32*  CREATININE 2.29* 2.14* 2.13* 1.87*  CALCIUM 9.2  --  9.1 8.8*  PROT  --   --   --  6.6  BILITOT  --   --    --  1.2  ALKPHOS  --   --   --  42  ALT  --   --   --  12*  AST  --   --   --  19  GLUCOSE 147*  --  104* 90   9/20 - BNP: 969.2 9/20 - Troponin I: 0.10, 0.38, 0.46 9/20 - U/A: large number of leukocytes and bacteria, no nitrites 9/20 - D-dimer: 2.23 9/20 - Urine Creatinine: 105.69 9/20 - Urea nitrogen: pending 9/21 - Repeat Troponin I: pending  Imaging/Diagnostic Tests: 9/20 - CXR: IMPRESSION: Chronic appearing changes bilaterally. Mild vascular congestion is noted with a new right pleural effusion. 9/21 - V/Q scan: pending  9/21 - Echocardiogram: pending  Mayo, Pete Pelt, MD 08/17/2017, 7:46 AM PGY-3, [place] Intern pager: [phone], text pages welcome

## 2017-08-17 NOTE — Progress Notes (Signed)
  Echocardiogram 2D Echocardiogram has been performed.  Gina Wilkinson 08/17/2017, 3:34 PM

## 2017-08-17 NOTE — Progress Notes (Signed)
Progress Note  Patient Name: Gina Wilkinson Date of Encounter: 08/17/2017  Primary Cardiologist: Lovena Le   Subjective    83 year old female with a history of severe aortic stenosis, acute diastolic congestive heart failure, atrial fibrillation, chronic kidney disease was admitted with progressive shortness of breath. The patient is also a DO NOT RESUSCITATE.  She's feeling a little bit better today.  Inpatient Medications    Scheduled Meds: . aspirin EC  81 mg Oral Daily  . cholecalciferol  5,000 Units Oral Daily  . feeding supplement (ENSURE ENLIVE)  237 mL Oral BID BM  . levothyroxine  88 mcg Oral QAC breakfast  . mouth rinse  15 mL Mouth Rinse BID  . sodium chloride flush  3 mL Intravenous Q12H   Continuous Infusions: . sodium chloride     PRN Meds: sodium chloride, acetaminophen, ondansetron (ZOFRAN) IV, sodium chloride flush   Vital Signs    Vitals:   08/16/17 1204 08/16/17 1745 08/16/17 1947 08/17/17 0522  BP: 113/65 100/74 127/74 126/70  Pulse: 83 81 (!) 41 (!) 126  Resp: (!) 24 (!) 29 20 18   Temp: 98.5 F (36.9 C) 98 F (36.7 C) 98 F (36.7 C) (!) 97.5 F (36.4 C)  TempSrc: Oral Oral Oral Axillary  SpO2: 93% 92% 91% 98%  Weight:    136 lb 0.4 oz (61.7 kg)  Height:        Intake/Output Summary (Last 24 hours) at 08/17/17 1315 Last data filed at 08/17/17 0559  Gross per 24 hour  Intake              360 ml  Output              750 ml  Net             -390 ml   Filed Weights   08/16/17 0110 08/17/17 0522  Weight: 132 lb 4.4 oz (60 kg) 136 lb 0.4 oz (61.7 kg)    Telemetry    Atrial fib  - Personally Reviewed  ECG     atrial fib  - Personally Reviewed  Physical Exam   GEN:  Elderly female. She's sitting quietly in a chair.  Neck: No JVD Cardiac: . Irregularly irregular. She has a 2 to 3/6 systolic ejection murmur at the left sternal border  Respiratory:  decreased breath sounds bilaterally with wheezes and rhonchi.Marland Kitchen GI: Soft, nontender,  non-distended  MS: No edema; No deformity. Neuro:  Nonfocal  Psych: Normal affect   Labs    Chemistry Recent Labs Lab 08/15/17 1128 08/15/17 2026 08/16/17 0139 08/17/17 0349  NA 137  --  137 137  K 4.7  --  4.3 4.4  CL 102  --  101 101  CO2 24  --  28 29  GLUCOSE 147*  --  104* 90  BUN 33*  --  33* 32*  CREATININE 2.29* 2.14* 2.13* 1.87*  CALCIUM 9.2  --  9.1 8.8*  PROT  --   --   --  6.6  ALBUMIN  --   --   --  3.2*  AST  --   --   --  19  ALT  --   --   --  12*  ALKPHOS  --   --   --  42  BILITOT  --   --   --  1.2  GFRNONAA 17* 18* 19* 22*  GFRAA 20* 21* 22* 25*  ANIONGAP 11  --  8 7  Hematology Recent Labs Lab 08/15/17 1128 08/15/17 2026 08/16/17 0139  WBC 6.2 7.0 5.9  RBC 3.38* 3.27* 3.27*  HGB 9.4* 9.2* 9.1*  HCT 30.4* 29.4* 29.1*  MCV 89.9 89.9 89.0  MCH 27.8 28.1 27.8  MCHC 30.9 31.3 31.3  RDW 17.7* 17.7* 18.0*  PLT 186 181 172    Cardiac Enzymes Recent Labs Lab 08/15/17 1128 08/15/17 2026 08/15/17 2343 08/16/17 0758  TROPONINI 0.10* 0.38* 0.46* 0.32*   No results for input(s): TROPIPOC in the last 168 hours.   BNP Recent Labs Lab 08/15/17 1128  BNP 969.2*     DDimer  Recent Labs Lab 08/15/17 1607  DDIMER 2.23*     Radiology    Nm Pulmonary Perf And Vent  Result Date: 08/16/2017 CLINICAL DATA:  Positive D-dimer.  Shortness of breath EXAM: NUCLEAR MEDICINE VENTILATION - PERFUSION LUNG SCAN TECHNIQUE: Ventilation images were obtained in multiple projections using inhaled aerosol Tc-64m DTPA. Perfusion images were obtained in multiple projections after intravenous injection of Tc-67m MAA. RADIOPHARMACEUTICALS:  30.0 mCi Technetium-19m DTPA aerosol inhalation and 4.0 mCi Technetium-50m MAA IV COMPARISON:  Chest x-ray 08/15/2017 FINDINGS: Ventilation: Patchy nonsegmental ventilation defects Perfusion: Patchy nonsegmental perfusion defects, similar to ventilation defects. No segmental perfusion defects. IMPRESSION: Patchy nonsegmental  ventilation and perfusion defects. Low probability study for pulmonary embolus. Electronically Signed   By: Rolm Baptise M.D.   On: 08/16/2017 15:24    Cardiac Studies     Patient Profile     83 y.o. female with atrial fibrillation, acute diastolic congestive heart, severe aortic stenosis  Assessment & Plan    1. Severe aortic stenosis: She's not a candidate for aortic valve replacement. Continue supportive care. Palate of care has been consult  2. Acute diastolic congestive heart failure:  Patient seems comfortable at present. A repeat echo has been ordered.  3. Chronic kidney disease: Creatinine is slightly improved. For questions or updates, please contact [place] Please consult [email or web address] for contact info under Cardiology/STEMI.      Signed, Mertie Moores, MD  08/17/2017, 1:15 PM

## 2017-08-17 NOTE — Progress Notes (Signed)
FPTS Interim Progress Note  S:Stopped by to check on resp status as RN had called earlier in shift with increased O2 requirement. Patient sleeping with normal WOB. Caregiver from home at bedside.   O: BP 127/74 (BP Location: Left Arm)   Pulse (!) 41   Temp 98 F (36.7 C) (Oral)   Resp 20   Ht 5' 5.5" (1.664 m)   Wt 132 lb 4.4 oz (60 kg)   SpO2 91%   BMI 21.68 kg/m   Gen: elderly female sleeping, easily arousable CV: loud systolic murmur, regular rate Resp: mild bibasilar crackles, scattered wheezing, easy WOB on 5L by NRB  A/P: Hypoxia: decreased concern for acute fluid overload based on exam, suspect complications of AS, anemia, and pulmonary HTN. Low likelihood PE given low probability VQ scan today. Can consider additional diuresis if worsening, however, this patient is a challenging case as she needs preload for AS.  -continue O2 as needed  Sela Hilding, MD 08/17/2017, 2:08 AM PGY-2, [place] Medicine Service pager [phone]

## 2017-08-17 NOTE — Evaluation (Signed)
Physical Therapy Evaluation Patient Details Name: Gina Wilkinson MRN: [id number] DOB: May 14, 1920 Today's Date: 08/17/2017   History of Present Illness  Gina Wilkinson is a 83 y.o. female presenting with Dyspnea and fatigue that started on the evening of 9/19. PMH is significant for HFpEF, Aortic Stenosis, Chronic Afib, CKD IV, Pulm artery HTN, Chronic Resp Failure, myelodysplastic syndrome, and anemia   Clinical Impression  Pt admitted with above diagnosis. Pt currently with functional limitations due to the deficits listed below (see PT Problem List). Pt's O2 stable on 4L O2 with NRB, ambulated 50' with RW and min guard A.  Pt will benefit from skilled PT to increase their independence and safety with mobility to allow discharge to the venue listed below.       Follow Up Recommendations Home health PT    Equipment Recommendations  None recommended by PT    Recommendations for Other Services       Precautions / Restrictions Precautions Precautions: Fall Restrictions Weight Bearing Restrictions: No      Mobility  Bed Mobility Overal bed mobility: Needs Assistance Bed Mobility: Supine to Sit     Supine to sit: Min assist     General bed mobility comments: min A to trunk elevation  Transfers Overall transfer level: Needs assistance Equipment used: Rolling walker (2 wheeled) Transfers: Sit to/from [facility] Sit to Stand: Min assist;Min guard Stand pivot transfers: Min assist       General transfer comment: stood several times with and without RW as pt had to urgently use BSC. Min A when pivoting without use of RW, min-guard when RW being used  Ambulation/Gait Ambulation/Gait assistance: Physicist, medical (Feet): 50 Feet Assistive device: Rolling walker (2 wheeled) Gait Pattern/deviations: Step-through pattern;Decreased stride length;Trunk flexed;Narrow base of support Gait velocity: decreased   General Gait Details: pt ambulated on  4L O2 on NRB, O2 sats remained no lower than 96%  Stairs            Wheelchair Mobility    Modified Rankin (Stroke Patients Only)       Balance Overall balance assessment: Needs assistance Sitting-balance support: No upper extremity supported Sitting balance-Leahy Scale: Good     Standing balance support: No upper extremity supported Standing balance-Leahy Scale: Fair Standing balance comment: pt able to stand without support in static position for clean up and changing gown                             Pertinent Vitals/Pain Pain Assessment: No/denies pain    Home Living Family/patient expects to be discharged to:: Private residence Living Arrangements: Alone Available Help at Discharge: Personal care attendant;Available 24 hours/day;Family Type of Home: House Home Access: Stairs to enter Entrance Stairs-Rails: Right Entrance Stairs-Number of Steps: 4 Home Layout: Two level;Able to live on main level with bedroom/bathroom Home Equipment: Gilford Rile - 2 wheels;Toilet riser;Bedside commode Additional Comments: 24/7 care attendents "comfort keepers"    Prior Function Level of Independence: Needs assistance   Gait / Transfers Assistance Needed: Uses RW  ADL's / Homemaking Assistance Needed: care attendents assist with some transfers and hygiene needs when asked, assist with bathing and dressing  Comments: uses home O2     Hand Dominance        Extremity/Trunk Assessment   Upper Extremity Assessment Upper Extremity Assessment: Generalized weakness    Lower Extremity Assessment Lower Extremity Assessment: Generalized weakness    Cervical / Trunk Assessment  Cervical / Trunk Assessment: Kyphotic  Communication   Communication: HOH  Cognition Arousal/Alertness: Awake/alert Behavior During Therapy: WFL for tasks assessed/performed Overall Cognitive Status: History of cognitive impairments - at baseline                                         General Comments      Exercises     Assessment/Plan    PT Assessment Patient needs continued PT services  PT Problem List Cardiopulmonary status limiting activity;Decreased mobility;Decreased activity tolerance;Decreased strength       PT Treatment Interventions DME instruction;Gait training;Stair training;Functional mobility training;Therapeutic activities;Therapeutic exercise;Balance training;Patient/family education    PT Goals (Current goals can be found in the Care Plan section)  Acute Rehab PT Goals Patient Stated Goal: return home PT Goal Formulation: With patient Time For Goal Achievement: 08/31/17 Potential to Achieve Goals: Good    Frequency Min 3X/week   Barriers to discharge        Co-evaluation               AM-PAC PT "6 Clicks" Daily Activity  Outcome Measure Difficulty turning over in bed (including adjusting bedclothes, sheets and blankets)?: Unable Difficulty moving from lying on back to sitting on the side of the bed? : Unable Difficulty sitting down on and standing up from a chair with arms (e.g., wheelchair, bedside commode, etc,.)?: Unable Help needed moving to and from a bed to chair (including a wheelchair)?: A Little Help needed walking in hospital room?: A Little Help needed climbing 3-5 steps with a railing? : A Little 6 Click Score: 12    End of Session Equipment Utilized During Treatment: Gait belt Activity Tolerance: Patient tolerated treatment well Patient left: in chair;with call bell/phone within reach Nurse Communication: Mobility status PT Visit Diagnosis: Unsteadiness on feet (R26.81);Muscle weakness (generalized) (M62.81)    Time: 3354-5625 PT Time Calculation (min) (ACUTE ONLY): 45 min   Charges:   PT Evaluation $PT Eval Moderate Complexity: 1 Mod PT Treatments $Gait Training: 8-22 mins $Therapeutic Activity: 8-22 mins   PT G Codes:        Leighton Roach, PT  Acute Rehab Services   [place] 08/17/2017, 2:07 PM

## 2017-08-18 LAB — BASIC METABOLIC PANEL
Anion gap: 7 (ref 5–15)
BUN: 25 mg/dL — ABNORMAL HIGH (ref 6–20)
CHLORIDE: 102 mmol/L (ref 101–111)
CO2: 29 mmol/L (ref 22–32)
CREATININE: 1.56 mg/dL — AB (ref 0.44–1.00)
Calcium: 9 mg/dL (ref 8.9–10.3)
GFR, EST AFRICAN AMERICAN: 31 mL/min — AB (ref >60)
GFR, EST NON AFRICAN AMERICAN: 27 mL/min — AB (ref >60)
Glucose, Bld: 93 mg/dL (ref 65–99)
Potassium: 4.1 mmol/L (ref 3.5–5.1)
SODIUM: 138 mmol/L (ref 135–145)

## 2017-08-18 LAB — CBC
HCT: 30.7 % — ABNORMAL LOW (ref 36.0–46.0)
Hemoglobin: 9.7 g/dL — ABNORMAL LOW (ref 12.0–15.0)
MCH: 28.5 pg (ref 26.0–34.0)
MCHC: 31.6 g/dL (ref 30.0–36.0)
MCV: 90.3 fL (ref 78.0–100.0)
Platelets: 190 10*3/uL (ref 150–400)
RBC: 3.4 MIL/uL — ABNORMAL LOW (ref 3.87–5.11)
RDW: 17.3 % — ABNORMAL HIGH (ref 11.5–15.5)
WBC: 4.8 10*3/uL (ref 4.0–10.5)

## 2017-08-18 MED ORDER — FUROSEMIDE 20 MG PO TABS
20.0000 mg | ORAL_TABLET | Freq: Every day | ORAL | Status: DC
  Administered 2017-08-18 – 2017-08-20 (×3): 20 mg via ORAL
  Filled 2017-08-18 (×3): qty 1

## 2017-08-18 NOTE — Progress Notes (Signed)
Progress Note  Patient Name: Gina Wilkinson Date of Encounter: 08/18/2017  Primary Cardiologist: Lovena Le   Subjective    83 year old female with a history of severe aortic stenosis, acute diastolic congestive heart failure, atrial fibrillation, chronic kidney disease was admitted with progressive shortness of breath. The patient is also a DO NOT RESUSCITATE.  Breathing is fair  No CP    Inpatient Medications    Scheduled Meds: . aspirin EC  81 mg Oral Daily  . cholecalciferol  5,000 Units Oral Daily  . feeding supplement (ENSURE ENLIVE)  237 mL Oral BID BM  . furosemide  20 mg Oral Daily  . Influenza vac split quadrivalent PF  0.5 mL Intramuscular Tomorrow-1000  . levothyroxine  88 mcg Oral QAC breakfast  . mouth rinse  15 mL Mouth Rinse BID  . sodium chloride flush  3 mL Intravenous Q12H   Continuous Infusions: . sodium chloride     PRN Meds: sodium chloride, acetaminophen, ondansetron (ZOFRAN) IV, sodium chloride flush   Vital Signs    Vitals:   08/17/17 0522 08/17/17 2030 08/18/17 0545 08/18/17 0815  BP: 126/70 (!) 116/46 123/72 129/68  Pulse: (!) 126  70 70  Resp: 18 19 (!) 23 19  Temp: (!) 97.5 F (36.4 C) 98 F (36.7 C) (!) 97.5 F (36.4 C) (!) 97.4 F (36.3 C)  TempSrc: Axillary Oral Oral Axillary  SpO2: 98% 91% 93% 92%  Weight: 136 lb 0.4 oz (61.7 kg)  135 lb 5.8 oz (61.4 kg)   Height:        Intake/Output Summary (Last 24 hours) at 08/18/17 1131 Last data filed at 08/18/17 0900  Gross per 24 hour  Intake              600 ml  Output              900 ml  Net             -300 ml   Filed Weights   08/16/17 0110 08/17/17 0522 08/18/17 0545  Weight: 132 lb 4.4 oz (60 kg) 136 lb 0.4 oz (61.7 kg) 135 lb 5.8 oz (61.4 kg)    Telemetry    Atrial fib  60s   - Personally Reviewed  ECG      Physical Exam   GEN:  Elderly female. She's sitting quietly in a chair. Frail   Neck: No JVD Cardiac: . Irregularly irregular. She has a 3/6 systolic Across  chest  Respiratory:  Rales at bases   GI: Soft, nontender, non-distended  MS: No edema; No deformity. Neuro:  Nonfocal  Psych: Normal affect   Labs    Chemistry  Recent Labs Lab 08/16/17 0139 08/17/17 0349 08/18/17 0720  NA 137 137 138  K 4.3 4.4 4.1  CL 101 101 102  CO2 28 29 29   GLUCOSE 104* 90 93  BUN 33* 32* 25*  CREATININE 2.13* 1.87* 1.56*  CALCIUM 9.1 8.8* 9.0  PROT  --  6.6  --   ALBUMIN  --  3.2*  --   AST  --  19  --   ALT  --  12*  --   ALKPHOS  --  42  --   BILITOT  --  1.2  --   GFRNONAA 19* 22* 27*  GFRAA 22* 25* 31*  ANIONGAP 8 7 7      Hematology  Recent Labs Lab 08/15/17 2026 08/16/17 0139 08/18/17 0720  WBC 7.0 5.9 4.8  RBC 3.27*  3.27* 3.40*  HGB 9.2* 9.1* 9.7*  HCT 29.4* 29.1* 30.7*  MCV 89.9 89.0 90.3  MCH 28.1 27.8 28.5  MCHC 31.3 31.3 31.6  RDW 17.7* 18.0* 17.3*  PLT 181 172 190    Cardiac Enzymes  Recent Labs Lab 08/15/17 1128 08/15/17 2026 08/15/17 2343 08/16/17 0758  TROPONINI 0.10* 0.38* 0.46* 0.32*   No results for input(s): TROPIPOC in the last 168 hours.   BNP  Recent Labs Lab 08/15/17 1128  BNP 969.2*     DDimer   Recent Labs Lab 08/15/17 1607  DDIMER 2.23*     Radiology    Nm Pulmonary Perf And Vent  Result Date: 08/16/2017 CLINICAL DATA:  Positive D-dimer.  Shortness of breath EXAM: NUCLEAR MEDICINE VENTILATION - PERFUSION LUNG SCAN TECHNIQUE: Ventilation images were obtained in multiple projections using inhaled aerosol Tc-15m DTPA. Perfusion images were obtained in multiple projections after intravenous injection of Tc-46m MAA. RADIOPHARMACEUTICALS:  30.0 mCi Technetium-18m DTPA aerosol inhalation and 4.0 mCi Technetium-31m MAA IV COMPARISON:  Chest x-ray 08/15/2017 FINDINGS: Ventilation: Patchy nonsegmental ventilation defects Perfusion: Patchy nonsegmental perfusion defects, similar to ventilation defects. No segmental perfusion defects. IMPRESSION: Patchy nonsegmental ventilation and perfusion  defects. Low probability study for pulmonary embolus. Electronically Signed   By: Rolm Baptise M.D.   On: 08/16/2017 15:24    Cardiac Studies     Patient Profile     83 y.o. female with atrial fibrillation, acute diastolic congestive heart, severe aortic stenosis  Assessment & Plan    1. Severe aortic stenosis: She's not a candidate for aortic valve replacement. Continue to watch fluid balance  Encourage palliative care/hospic consult   Echo as noted below  Comfort care reasonable  2. Acute diastolic congestive heart failure:  Patient seems comfortable at present. Echo done yesteday (last one in Feb)  Mean gradient across AV 62 mm Hg  Consistent with critical AS  LVEF 65 to 70%    3. Chronic kidney disease: Creatinine is slightly improved. For questions or updates, please contact [place] Please consult [email or web address] for contact info under Cardiology/STEMI.      Signed, Dorris Carnes, MD  08/18/2017, 11:31 AM

## 2017-08-18 NOTE — Care Management Note (Signed)
Case Management Note Marvetta Gibbons RN, BSN Unit 4E-Case Manager [phone]  Patient Details  Name: Gina Wilkinson MRN: [id number] Date of Birth: 01/30/1920  Subjective/Objective:   Pt admitted with HF/pulm. edema               Action/Plan: PTA pt lived at home - has 24/7 caregivers, [facility] consulted for [place]. CM to follow for d/c needs.   Expected Discharge Date:  08/19/17               Expected Discharge Plan:  [place]  In-House Referral:     Discharge planning Services  CM Consult  Post Acute Care Choice:  Home Health Choice offered to:  Patient, Adult Children  DME Arranged:  N/A DME Agency:  NA  HH Arranged:  PT [place] Agency:  [place]  Status of Service:  Completed, signed off  If discussed at [place] Line of Stay Meetings, dates discussed:    Discharge Disposition: home/home health   Additional Comments:  08/18/17- 1100- Ladon Vandenberghe RN, CM- order placed for HHPT- spoke with pt and daughter [name] at bedside- choice offered for [facility] agency- per [name] they use [facility] for [facility] needs- no DME need identified- referral for HHPT called to [facility] with [facility]- referral accepted.   Dawayne Patricia, RN 08/18/2017, 11:51 AM

## 2017-08-18 NOTE — Progress Notes (Signed)
Family Medicine Teaching Service Daily Progress Note Intern Pager: [phone]  Patient name: Gina Wilkinson Medical record number: [id number] Date of birth: 1920-02-06 Age: 83 y.o. Gender: female  Primary Care Provider: McDiarmid, Blane Ohara, MD Consultants: Cardiology Code Status: DNR  Pt Overview and Major Events to Date:  9/20 - EKG 9/20 - CXR 9/20 - V/Q scan- low probability for PE 9/22 - Aranesp x 1  Assessment and Plan: Gina Wilkinson is a 83 y.o. female presenting with Dyspnea and fatigue that started on the evening of 9/19. PMH is significant for HFpEF, Aortic Stenosis, Chronic Afib, CKD IV, Pulm artery HTN, Chronic Resp Failure, myelodysplastic syndrome, and anemia   Acute Hypoxic Respiratory Failure due to Acute Exacerbation of HFpEF 2/2 Worsening Aortic Stenosis: Stable. On 4L [place] O2 (uses 2L O2 at home) and had desaturations to the mid 80s on [place]. Weight down 1 lb from yesterday at 135lb. Dry weight appears ~135lb (ED weight 132lb)  UOP 600cc. ECHO EF 65-70%, severe aortic stenosis with mild regurgitation.  - transition to PO Lasix 20mg  daily - daily weights - monitor I/O - vitals per unit routine - wean O2 as able - cards does not recommend surgery for aortic surgery  - Palliative consulted, appreciate recommendations. After discussion with [facility]CPOA, did not seem that they were receptive to palliative f/u. - HHPT ordered  AKI on CKD4: Resolved. Cr peaked at 2.29 this admission, but is down to 1.56 (back to baseline of 1.5-1.8). FEUrea was 17.4%, consistent with pre-renal disease. - Trend BMPs - strict I/O  - avoid other nephrotoxic agents  Anemia in chronic kidney disease: Hgb 9.7. Has gotten Aranesp injections as an outpatient in the past. S/p Aranesp injection 9/22. hgb stable today.   Hypothyroidism: Stable. Recent TSH 05/2017 was 1.67. - Continue Synthroid 47mcg  Atrial Fibrillation, chronic: stable. Not on beta blocker due to bradycardia and not a candidate for  anticoagulation due to co-morbidities.  - Monitor  Myleodysplastic Syndrome: Stable. Followed by Oncology. Patient has been taking Darbepoetin Alfa-Albumin for several years. - Follow up with Oncology outpatient to resume treatment  Pulmonary HTN: ECHO in 12/2016 with PA peak pressure 39mmHg. - plan as noted above  Protein-calorie malnutrition: Albumin 3.2. - Ensure bid  FEN/GI: heart healthy diet PPx: heparin  Disposition: Plan for discharge home once stable on [place]. Still requiring Aerosol mask this morning. Likely in the next 1-2 days.  Subjective:  Doing well today. She worked with PT yesterday without difficulty. Her breathing is fine. Eating well.   Objective: Temp:  [97.5 F (36.4 C)-98 F (36.7 C)] 97.5 F (36.4 C) (09/23 0545) Pulse Rate:  [70] 70 (09/23 0545) Resp:  [19-23] 23 (09/23 0545) BP: (116-123)/(46-72) 123/72 (09/23 0545) SpO2:  [91 %-93 %] 93 % (09/23 0545) Weight:  [135 lb 5.8 oz (61.4 kg)] 135 lb 5.8 oz (61.4 kg) (09/23 0545) Physical Exam: General: Awake, sitting up in bed, in NAD Cardiovascular: RRR, III/VI holosystolic murmur Respiratory: normal work of breathing with 4L  in place, crackles auscultated half way up the lungs bilaterally . Abdomen: non-distended, soft, non-tender, +BS in all quadrants Extremities: no cyanosis, no lower extremity edema.  Laboratory:  Recent Labs Lab 08/15/17 1128 08/15/17 2026 08/16/17 0139  WBC 6.2 7.0 5.9  HGB 9.4* 9.2* 9.1*  HCT 30.4* 29.4* 29.1*  PLT 186 181 172    Recent Labs Lab 08/15/17 1128 08/15/17 2026 08/16/17 0139 08/17/17 0349  NA 137  --  137 137  K 4.7  --  4.3 4.4  CL 102  --  101 101  CO2 24  --  28 29  BUN 33*  --  33* 32*  CREATININE 2.29* 2.14* 2.13* 1.87*  CALCIUM 9.2  --  9.1 8.8*  PROT  --   --   --  6.6  BILITOT  --   --   --  1.2  ALKPHOS  --   --   --  42  ALT  --   --   --  12*  AST  --   --   --  19  GLUCOSE 147*  --  104* 90   9/20 - BNP: 969.2 9/20 - Troponin  I: 0.10, 0.38, 0.46 9/20 - U/A: large number of leukocytes and bacteria, no nitrites 9/20 - D-dimer: 2.23 9/20 - Urine Creatinine: 105.69 9/20 - Urea nitrogen: pending 9/21 - Repeat Troponin I: pending  Imaging/Diagnostic Tests: 9/20 - CXR: IMPRESSION: Chronic appearing changes bilaterally. Mild vascular congestion is noted with a new right pleural effusion. 9/21 - V/Q scan: low probability 9/21 - Echocardiogram:  EF 65-70%, severe aortic stenosis with mild regurgitation. Mild mitral regurgitation. Left atrium moderately dilated. Right atrium mod-severely dilated. Severe regurgitation of tricuspid valve. PA pressure 13mmHg   Smiley Houseman, MD 08/18/2017, 7:15 AM PGY-3, [place] Intern pager: [phone], text pages welcome

## 2017-08-19 LAB — BASIC METABOLIC PANEL
Anion gap: 8 (ref 5–15)
BUN: 25 mg/dL — AB (ref 6–20)
CO2: 30 mmol/L (ref 22–32)
CREATININE: 1.5 mg/dL — AB (ref 0.44–1.00)
Calcium: 9.1 mg/dL (ref 8.9–10.3)
Chloride: 100 mmol/L — ABNORMAL LOW (ref 101–111)
GFR calc Af Amer: 33 mL/min — ABNORMAL LOW (ref >60)
GFR, EST NON AFRICAN AMERICAN: 28 mL/min — AB (ref >60)
Glucose, Bld: 93 mg/dL (ref 65–99)
POTASSIUM: 4.1 mmol/L (ref 3.5–5.1)
SODIUM: 138 mmol/L (ref 135–145)

## 2017-08-19 LAB — CBC
HEMATOCRIT: 31.1 % — AB (ref 36.0–46.0)
Hemoglobin: 9.5 g/dL — ABNORMAL LOW (ref 12.0–15.0)
MCH: 27.7 pg (ref 26.0–34.0)
MCHC: 30.5 g/dL (ref 30.0–36.0)
MCV: 90.7 fL (ref 78.0–100.0)
PLATELETS: 203 10*3/uL (ref 150–400)
RBC: 3.43 MIL/uL — ABNORMAL LOW (ref 3.87–5.11)
RDW: 17.2 % — AB (ref 11.5–15.5)
WBC: 4.4 10*3/uL (ref 4.0–10.5)

## 2017-08-19 MED ORDER — FLUTICASONE PROPIONATE 50 MCG/ACT NA SUSP
1.0000 | Freq: Every day | NASAL | Status: DC
  Administered 2017-08-20: 1 via NASAL
  Filled 2017-08-19: qty 16

## 2017-08-19 NOTE — Progress Notes (Signed)
Physical Therapy Treatment Patient Details Name: Gina Wilkinson MRN: [id number] DOB: [birth year] Today's Date: 08/19/2017    History of Present Illness Gina Wilkinson is a 83 y.o. female presenting with Dyspnea and fatigue that started on the evening of 9/19. PMH is significant for HFpEF, Aortic Stenosis, Chronic Afib, CKD IV, Pulm artery HTN, Chronic Resp Failure, myelodysplastic syndrome, and anemia     PT Comments    Patient tolerated ambulating 60ft with RW, min guard/min A, and 3L O2 via [place].  Pt demonstrated anxiety about mobilizing but willing to participate. Daughter present and engaged in session. Pt overall required min guard/min A for functional transfers. Current plan remains appropriate.   Follow Up Recommendations  Home health PT     Equipment Recommendations  None recommended by PT    Recommendations for Other Services       Precautions / Restrictions Precautions Precautions: Fall Restrictions Weight Bearing Restrictions: No    Mobility  Bed Mobility Overal bed mobility: Modified Independent Bed Mobility: Supine to Sit           General bed mobility comments: increased time and effort  Transfers Overall transfer level: Needs assistance Equipment used: Rolling walker (2 wheeled) Transfers: Sit to/from [employer] Transfers Sit to Stand: Min guard;Min assist         General transfer comment: min guard from EOB and min A from commode with use of grab bar  Ambulation/Gait Ambulation/Gait assistance: Min assist Ambulation Distance (Feet): 80 Feet Assistive device: Rolling walker (2 wheeled) Gait Pattern/deviations: Step-through pattern;Decreased stride length;Trunk flexed;Narrow base of support Gait velocity: decreased   General Gait Details: cues for posture and forward gaze   Stairs            Wheelchair Mobility    Modified Rankin (Stroke Patients Only)       Balance Overall balance assessment: Needs  assistance Sitting-balance support: No upper extremity supported Sitting balance-Leahy Scale: Good     Standing balance support: No upper extremity supported Standing balance-Leahy Scale: Fair                              Cognition Arousal/Alertness: Awake/alert Behavior During Therapy: WFL for tasks assessed/performed Overall Cognitive Status: History of cognitive impairments - at baseline                                        Exercises      General Comments General comments (skin integrity, edema, etc.): pt on 3L O2 via [place] when ambulating and 2L O2 via [place] to maintain sats 90% or >      Pertinent Vitals/Pain Pain Assessment: No/denies pain    Home Living                      Prior Function            PT Goals (current goals can now be found in the care plan section) Acute Rehab PT Goals Patient Stated Goal: return home PT Goal Formulation: With patient Time For Goal Achievement: 08/31/17 Potential to Achieve Goals: Good Progress towards PT goals: Progressing toward goals    Frequency    Min 3X/week      PT Plan Current plan remains appropriate    Co-evaluation  AM-PAC PT "6 Clicks" Daily Activity  Outcome Measure  Difficulty turning over in bed (including adjusting bedclothes, sheets and blankets)?: Unable Difficulty moving from lying on back to sitting on the side of the bed? : A Lot Difficulty sitting down on and standing up from a chair with arms (e.g., wheelchair, bedside commode, etc,.)?: A Lot Help needed moving to and from a bed to chair (including a wheelchair)?: A Little Help needed walking in hospital room?: A Little Help needed climbing 3-5 steps with a railing? : A Little 6 Click Score: 14    End of Session Equipment Utilized During Treatment: Gait belt Activity Tolerance: Patient tolerated treatment well Patient left: in chair;with call bell/phone within reach;with family/visitor  present Nurse Communication: Mobility status PT Visit Diagnosis: Unsteadiness on feet (R26.81);Muscle weakness (generalized) (M62.81)     Time: 1410-3013 PT Time Calculation (min) (ACUTE ONLY): 23 min  Charges:  $Gait Training: 8-22 mins $Therapeutic Activity: 8-22 mins                    G Codes:       Earney Navy, PTA Pager: [phone]     Darliss Cheney 08/19/2017, 2:10 PM

## 2017-08-19 NOTE — Care Management Important Message (Signed)
Important Message  Patient Details  Name: Gina Wilkinson MRN: [id number] Date of Birth: Nov 25, 1920   Medicare Important Message Given:  Yes    Cinthya Bors Abena 08/19/2017, 10:20 AM

## 2017-08-19 NOTE — Progress Notes (Signed)
Family Medicine Teaching Service Daily Progress Note Intern Pager: [phone]  Patient name: Gina Wilkinson Medical record number: [id number] Date of birth: Sep 21, 1920 Age: 83 y.o. Gender: female  Primary Care Provider: McDiarmid, Blane Ohara, MD Consultants: Cardiology Code Status: DNR  Pt Overview and Major Events to Date:  9/20 - EKG 9/20 - CXR 9/20 - V/Q scan- low probability for PE 9/22 - Aranesp x 1  Assessment and Plan: Gina Wilkinson is a 83 y.o. female presenting with Dyspnea and fatigue that started on the evening of 9/19. PMH is significant for HFpEF, Aortic Stenosis, Chronic Afib, CKD IV, Pulm artery HTN, Chronic Resp Failure, myelodysplastic syndrome, and anemia   Acute Hypoxic Respiratory Failure due to Acute Exacerbation of HFpEF 2/2 Worsening Aortic Stenosis: Stable. On 4L [place] O2 (uses 2L O2 at home) and had desaturations to the mid 80s on [place]. Weight down 1 lb from yesterday at 135lb. Dry weight appears ~135lb (ED weight 132lb)  UOP 600cc. ECHO EF 65-70%, severe aortic stenosis with mild regurgitation.  - Continue PO Lasix 20mg  daily - daily weights - monitor I/O - vitals per unit routine - wean O2 as able - cards does not recommend surgery for aortic surgery  - Palliative consulted, appreciate recommendations. After discussion with HCPOA, did not seem that they were receptive to palliative f/u. - HHPT ordered  Nasal Congestion: Acute issue that has been somewhat bothersome for her since admission. Only mild but may be contributing to her increased O2 requirement. - Flonase 1 spray in each nare daily  AKI on CKD4: Resolved. Cr peaked at 2.29 this admission, but is down to 1.56 (back to baseline of 1.5-1.8). FEUrea was 17.4%, consistent with pre-renal disease. - Trend BMPs - strict I/O  - avoid other nephrotoxic agents  Anemia in chronic kidney disease: Hgb 9.7. Has gotten Aranesp injections as an outpatient in the past. S/p Aranesp injection 9/22. -  CBC  Hypothyroidism: Stable. Recent TSH 05/2017 was 1.67. - Continue Synthroid 27mcg  Atrial Fibrillation, chronic: stable. Not on beta blocker due to bradycardia and not a candidate for anticoagulation due to co-morbidities.  - Monitor  Myleodysplastic Syndrome: Stable. Followed by Oncology. Patient has been taking Darbepoetin Alfa-Albumin for several years. - Follow up with Oncology outpatient to resume treatment  Pulmonary HTN: ECHO in 12/2016 with PA peak pressure 50mmHg. - plan as noted above  Protein-calorie malnutrition: Albumin 3.2. - Ensure BID  FEN/GI: heart healthy diet PPx: heparin  Disposition: Plan for discharge home once stable on [place]. No longer requiring Aerosol mask this morning. Likely today or tomorrow.  Subjective:  Doing well today. She states she is breathing fine and has no chest pain. She is on 4L of O2 [place], normally at home she is on 2L of O2. She denies any difficulty with urination or stools and has a good appetite this morning. She states she would like to go home today. She does have some nasal congestion this morning.  Objective: Temp:  [97.3 F (36.3 C)-97.8 F (36.6 C)] 97.3 F (36.3 C) (09/24 0841) Pulse Rate:  [77-82] 77 (09/24 0841) Resp:  [15-23] 21 (09/24 0841) BP: (112-125)/(54-65) 120/54 (09/24 0841) SpO2:  [92 %-98 %] 92 % (09/24 0841) Weight:  [131 lb 9.8 oz (59.7 kg)] 131 lb 9.8 oz (59.7 kg) (09/24 0615) Physical Exam: General: Awake, sitting up in bed, in NAD Cardiovascular: Irregularly irregular Rhythm, III/VI holosystolic murmur Respiratory: normal work of breathing with 4L  in place, crackles auscultated in lower lung  fields bilaterally. Abdomen: non-distended, soft, non-tender, +BS in all quadrants Extremities: no cyanosis, no lower extremity edema.  Laboratory:  Recent Labs Lab 08/16/17 0139 08/18/17 0720 08/19/17 0717  WBC 5.9 4.8 4.4  HGB 9.1* 9.7* 9.5*  HCT 29.1* 30.7* 31.1*  PLT 172 190 203    Recent Labs Lab  08/17/17 0349 08/18/17 0720 08/19/17 0717  NA 137 138 138  K 4.4 4.1 4.1  CL 101 102 100*  CO2 29 29 30   BUN 32* 25* 25*  CREATININE 1.87* 1.56* 1.50*  CALCIUM 8.8* 9.0 9.1  PROT 6.6  --   --   BILITOT 1.2  --   --   ALKPHOS 42  --   --   ALT 12*  --   --   AST 19  --   --   GLUCOSE 90 93 93   9/20 - BNP: 969.2 9/20 - Troponin I: 0.10, 0.38, 0.46 9/20 - U/A: large number of leukocytes and bacteria, no nitrites 9/20 - D-dimer: 2.23 9/20 - Urine Creatinine: 105.69 9/20 - Urea nitrogen: 284 9/21 - Repeat Troponin I: 0.32  Imaging/Diagnostic Tests: 9/20 - CXR: IMPRESSION: Chronic appearing changes bilaterally. Mild vascular congestion is noted with a new right pleural effusion. 9/21 - V/Q scan: low probability 9/21 - Echocardiogram:  EF 65-70%, severe aortic stenosis with mild regurgitation. Mild mitral regurgitation. Left atrium moderately dilated. Right atrium mod-severely dilated. Severe regurgitation of tricuspid valve. PA pressure 7mmHg   Gina Alpha, DO 08/19/2017, 9:41 AM PGY-3, [place] Intern pager: [phone], text pages welcome

## 2017-08-20 ENCOUNTER — Encounter: Payer: Self-pay | Admitting: Family Medicine

## 2017-08-20 LAB — BASIC METABOLIC PANEL
ANION GAP: 6 (ref 5–15)
BUN: 28 mg/dL — ABNORMAL HIGH (ref 6–20)
CO2: 32 mmol/L (ref 22–32)
Calcium: 9 mg/dL (ref 8.9–10.3)
Chloride: 100 mmol/L — ABNORMAL LOW (ref 101–111)
Creatinine, Ser: 1.47 mg/dL — ABNORMAL HIGH (ref 0.44–1.00)
GFR, EST AFRICAN AMERICAN: 34 mL/min — AB (ref >60)
GFR, EST NON AFRICAN AMERICAN: 29 mL/min — AB (ref >60)
Glucose, Bld: 94 mg/dL (ref 65–99)
POTASSIUM: 4.2 mmol/L (ref 3.5–5.1)
SODIUM: 138 mmol/L (ref 135–145)

## 2017-08-20 MED ORDER — FUROSEMIDE 20 MG PO TABS: 20.0000 mg | ORAL_TABLET | Freq: Every day | ORAL | 1 refills | Status: DC

## 2017-08-20 MED ORDER — FLUTICASONE PROPIONATE 50 MCG/ACT NA SUSP: 1.0000 | Freq: Every day | NASAL | 0 refills | Status: DC

## 2017-08-20 NOTE — Progress Notes (Signed)
Family Medicine Teaching Service Daily Progress Note Intern Pager: [phone]  Patient name: Gina Wilkinson Medical record number: [id number] Date of birth: 20-Jan-1920 Age: 83 y.o. Gender: female  Primary Care Provider: McDiarmid, Blane Ohara, MD Consultants: Cardiology Code Status: DNR  Pt Overview and Major Events to Date:  9/20 - EKG 9/20 - CXR 9/20 - V/Q scan- low probability for PE 9/22 - Aranesp x 1  Assessment and Plan: [name] is a 83 y.o. female presenting with Dyspnea and fatigue that started on the evening of 9/19. PMH is significant for HFpEF, Aortic Stenosis, Chronic Afib, CKD IV, Pulm artery HTN, Chronic Resp Failure, myelodysplastic syndrome, and anemia   Acute Hypoxic Respiratory Failure due to Acute Exacerbation of HFpEF 2/2 Worsening Aortic Stenosis: Stable. On 4L [place] O2 (uses 2L O2 at home) and had desaturations to the mid 80s on [place]. Weight down 1 lb from yesterday at 135lb. Dry weight appears ~135lb (weight today 131lb)  UOP 200cc. ECHO EF 65-70%, severe aortic stenosis with mild regurgitation.  Family was counseled at length on the benefits of adding palliative care for further management of this issue.- Continue PO Lasix 20mg  daily - daily weights - monitor I/O - +313mL last 24hr; down 0.5L since admission - vitals per unit routine - wean O2 as able; on home oxygen requirement of 2L [place] - cards does not recommend surgery for aortic surgery  - Palliative consulted,  I spoke with both the patient and both of her daughters about what palliative care offered and that it does not mean will stop treating her for reversible illness. [name], the daughter who is HCPOA expressed desire to talk with mom and family once at home to make a decision. - HHPT ordered  Nasal Congestion: Acute issue that has been somewhat bothersome for her since admission. Only mild but may be contributing to her increased O2 requirement. - Flonase 1 spray in each nare daily; patient declined  AKI  on CKD4: Resolved. Cr peaked at 2.29 this admission, but is down to 1.47 (back to baseline of 1.5-1.8). FEUrea was 17.4%, consistent with pre-renal disease. - Trend BMPs - strict I/O  - avoid other nephrotoxic agents  Anemia in chronic kidney disease: Hgb 9.7. Has gotten Aranesp injections as an outpatient in the past. S/p Aranesp injection 9/22. - stable, outpatient follow up.  Hypothyroidism: Stable. Recent TSH 05/2017 was 1.67. - Continue Synthroid 40mcg  Atrial Fibrillation, chronic: stable. Not on beta blocker due to bradycardia and not a candidate for anticoagulation due to co-morbidities.  - Monitor  Myleodysplastic Syndrome: Stable. Followed by Oncology. Patient has been taking Darbepoetin Alfa-Albumin for several years. - Follow up with Oncology outpatient to resume treatment  Pulmonary HTN: ECHO in 12/2016 with PA peak pressure 77mmHg. - plan as noted above  Protein-calorie malnutrition: Albumin 3.2. - Ensure BID  FEN/GI: heart healthy diet PPx: heparin  Disposition: Plan for discharge home today.  Subjective:  Doing well today. She states she is breathing fine and has no chest pain. She is on 2L of O2 [place], normally at home she is on 2L of O2. This morning she states she feels well. She has no complaints and would like to go home. Reassured both patient and daughter about benefit of palliative care at home and patient expressed desire to receive palliative care at home. Case manager will follow up once they get home and have time to discuss as a family.  Objective: Temp:  [97.3 F (36.3 C)-97.7 F (36.5  C)] 97.7 F (36.5 C) (09/25 0619) Pulse Rate:  [70-105] 70 (09/25 0619) Resp:  [21-22] 22 (09/24 1238) BP: (100-120)/(54-80) 114/59 (09/25 0619) SpO2:  [91 %-92 %] 91 % (09/24 1238) Weight:  [131 lb 3.2 oz (59.5 kg)] 131 lb 3.2 oz (59.5 kg) (09/25 6195) Physical Exam: General: Awake, sitting up in bed, in NAD Cardiovascular: Irregularly irregular Rhythm, III/VI  holosystolic murmur Respiratory: normal work of breathing with 4L [place] in place, crackles auscultated in lower lung fields bilaterally. Abdomen: non-distended, soft, non-tender, +BS in all quadrants Extremities: no cyanosis, no lower extremity edema.  Laboratory:  Recent Labs Lab 08/16/17 0139 08/18/17 0720 08/19/17 0717  WBC 5.9 4.8 4.4  HGB 9.1* 9.7* 9.5*  HCT 29.1* 30.7* 31.1*  PLT 172 190 203    Recent Labs Lab 08/17/17 0349 08/18/17 0720 08/19/17 0717 08/20/17 0403  NA 137 138 138 138  K 4.4 4.1 4.1 4.2  CL 101 102 100* 100*  CO2 29 29 30  32  BUN 32* 25* 25* 28*  CREATININE 1.87* 1.56* 1.50* 1.47*  CALCIUM 8.8* 9.0 9.1 9.0  PROT 6.6  --   --   --   BILITOT 1.2  --   --   --   ALKPHOS 42  --   --   --   ALT 12*  --   --   --   AST 19  --   --   --   GLUCOSE 90 93 93 94   9/20 - BNP: 969.2 9/20 - Troponin I: 0.10, 0.38, 0.46 9/20 - U/A: large number of leukocytes and bacteria, no nitrites 9/20 - D-dimer: 2.23 9/20 - Urine Creatinine: 105.69 9/20 - Urea nitrogen: 284 9/21 - Repeat Troponin I: 0.32  Imaging/Diagnostic Tests: 9/20 - CXR: IMPRESSION: Chronic appearing changes bilaterally. Mild vascular congestion is noted with a new right pleural effusion. 9/21 - V/Q scan: low probability 9/21 - Echocardiogram:  EF 65-70%, severe aortic stenosis with mild regurgitation. Mild mitral regurgitation. Left atrium moderately dilated. Right atrium mod-severely dilated. Severe regurgitation of tricuspid valve. PA pressure 64mmHg   Nuala Alpha, DO 08/20/2017, 9:35 AM PGY-3, [place] Intern pager: [phone], text pages welcome

## 2017-08-20 NOTE — Progress Notes (Signed)
Social visit to patient by PCP, Dr Janilah Hojnacki.

## 2017-08-20 NOTE — Progress Notes (Signed)
Physical Therapy Treatment Patient Details Name: Gina Wilkinson MRN: [id number] DOB: 1920/05/22 Today's Date: 08/20/2017    History of Present Illness Gina Wilkinson is a 83 y.o. female presenting with Dyspnea and fatigue that started on the evening of 9/19. PMH is significant for HFpEF, Aortic Stenosis, Chronic Afib, CKD IV, Pulm artery HTN, Chronic Resp Failure, myelodysplastic syndrome, and anemia     PT Comments    Patient tolerated ambulating 148ft with min A, RW, and 2L O2 via Gina Wilkinson. Pt with SpO2 desat to 88% but quickly up to 94% with pursed lip breathing technique. Current plan remains appropriate.   Follow Up Recommendations  Home health PT     Equipment Recommendations  None recommended by PT    Recommendations for Other Services       Precautions / Restrictions Precautions Precautions: Fall Restrictions Weight Bearing Restrictions: No    Mobility  Bed Mobility               General bed mobility comments: pt OOB in chair upon arrival  Transfers Overall transfer level: Needs assistance Equipment used: Rolling walker (2 wheeled) Transfers: Sit to/from Stand Sit to Stand: Min guard         General transfer comment: min guard for safety  Ambulation/Gait Ambulation/Gait assistance: Min assist;Min guard Ambulation Distance (Feet): 100 Feet Assistive device: Rolling walker (2 wheeled) Gait Pattern/deviations: Step-through pattern;Decreased stride length;Trunk flexed;Narrow base of support Gait velocity: decreased   General Gait Details: multimodal cues for posture and forward gaze; SpO2 desat to 88% and pt educated on pursed lip and brief standing rest break taken on 2L O2 via Gina Wilkinson; after pursed lip breathing O2 up to 94%    Stairs            Wheelchair Mobility    Modified Rankin (Stroke Patients Only)       Balance Overall balance assessment: Needs assistance Sitting-balance support: No upper extremity supported Sitting balance-Leahy  Scale: Good     Standing balance support: No upper extremity supported Standing balance-Leahy Scale: Fair                              Cognition Arousal/Alertness: Awake/alert Behavior During Therapy: WFL for tasks assessed/performed Overall Cognitive Status: History of cognitive impairments - at baseline                                        Exercises      General Comments        Pertinent Vitals/Pain Pain Assessment: No/denies pain    Home Living                      Prior Function            PT Goals (current goals can now be found in the care plan section) Acute Rehab PT Goals Patient Stated Goal: return home PT Goal Formulation: With patient Time For Goal Achievement: 08/31/17 Potential to Achieve Goals: Good Progress towards PT goals: Progressing toward goals    Frequency    Min 3X/week      PT Plan Current plan remains appropriate    Co-evaluation              AM-PAC PT "6 Clicks" Daily Activity  Outcome Measure  Difficulty turning over in bed (including adjusting bedclothes,  sheets and blankets)?: A Lot Difficulty moving from lying on back to sitting on the side of the bed? : A Lot Difficulty sitting down on and standing up from a chair with arms (e.g., wheelchair, bedside commode, etc,.)?: A Lot Help needed moving to and from a bed to chair (including a wheelchair)?: A Little Help needed walking in hospital room?: A Little Help needed climbing 3-5 steps with a railing? : A Little 6 Click Score: 15    End of Session Equipment Utilized During Treatment: Gait belt Activity Tolerance: Patient tolerated treatment well Patient left: in chair;with call bell/phone within reach;with family/visitor present Nurse Communication: Mobility status PT Visit Diagnosis: Unsteadiness on feet (R26.81);Muscle weakness (generalized) (M62.81)     Time: 7867-6720 PT Time Calculation (min) (ACUTE ONLY): 22  min  Charges:  $Gait Training: 8-22 mins                    G Codes:       Gina Wilkinson, PTA Pager: [phone]     Darliss Cheney 08/20/2017, 1:32 PM

## 2017-08-20 NOTE — Discharge Instructions (Signed)
Mrs Neyer, you were recently hospitalized due to Heart Failure with Aortic Stenosis. You had too much fluid on your lungs so we gave you some medicine to help get rid of the extra fluid. You recovered well and your breathing overall is much improved. Please continue to take your medications as directed. We look forward to taking care of you in the [facility] next week. Thank you so much for being a wonderful patient and letting us participate in your care!

## 2017-08-20 NOTE — Progress Notes (Signed)
Discharged to home, left unit via wheelchair accompanied by sitter and family. Verbal ized understanding of all verbal and written instructions. Left unit with [facility], meeting daughter in car with home oxygen

## 2017-08-21 ENCOUNTER — Telehealth: Payer: Self-pay | Admitting: *Deleted

## 2017-08-21 NOTE — Telephone Encounter (Signed)
Gina Wilkinson with [facility] left message on nurse line requesting verbal order for palliative care. Patient is not ready for hospice care at this time. Please give her a call at [phone]. Derl Barrow, RN

## 2017-08-22 NOTE — Telephone Encounter (Signed)
Verbal order given.  Due to see patient on Monday oct 1 for evaluation. Jazmin Hartsell,CMA

## 2017-08-22 NOTE — Telephone Encounter (Signed)
Please call in order for home palliative care with [facility] for Gina Wilkinson.

## 2017-08-26 NOTE — Progress Notes (Signed)
   Subjective:   Patient ID: Gina Wilkinson    DOB: Sep 20, 1920, 83 y.o. female   MRN: [id number]  Gina Wilkinson is a 83 y.o. female with a history of severe aortic stenosis, diastolic CHF, HTN, afib here for hospital follow up, admitted for SOB.  Recent admission for Acute Hypoxic Respiratory Failure - Required up to 10L O2 in ED, received breathing treatment, Lasix - due to CHF exacerbation, worsening Aortic Stenosis - discharged on home 2L O2 - received palliative care consult: [facility], [facility] - new home dose: Lasix 20mg , daily - compliance: yes, although doesn't want to take medication, daughters ensure she does - side effects: no - Daughter states she remains with cough that keeps the patient up at night and is often tired in the morning. Also states she finds the nasal cannula out of nose when she wakes up in the morning. Has been doing flonase regularly and feels it helps. Denies waking up gasping for air. 2 pillow orthopnea. Currently on 3L O2.  Decreased urination - Not urinating as much but attributes to not drinking as much - Denies burning or pain with urination, no belly pain, no blood in urine   Review of Systems:  Per HPI.   Gina Wilkinson: reviewed. Smoking status reviewed. Medications reviewed.  Objective:   BP 97/63   Pulse 80   Temp 98.4 F (36.9 C) (Oral)   Wt 133 lb (60.3 kg)   SpO2 90% Comment: 3L O2  BMI 21.80 kg/m  Vitals and nursing note reviewed.  General: well nourished, well developed, in no acute distress with non-toxic appearance Neck: supple, non-tender without lymphadenopathy CV: regular rate and rhythm, systolic murmur, no rubs or gallops, no lower extremity edema Lungs: clear to auscultation bilaterally with normal work of breathing, currently on 3L O2, no wheezes/crackles Skin: warm, dry. Large bruises noted to dorsal hands B/L Neuro: Alert and oriented, speech normal  Assessment & Plan:   Aortic stenosis,  Severe Chronic. Newly worsening with dyspnea requiring recent hospitalization. At that time was noted to not be a surgical candidate. - referral placed to follow up with primary cardiologist, Dr. Radford Pax - discussed Palliative Care with patient and family and encouraged close follow up to determine goals of care.  Anemia of chronic renal failure, stage 4 (severe) (HCC) Checking CBC today. Follows with Dr. Alvy Bimler. Will fax results to their office.  Decreased urination Denies red flag symptoms. Likely due to poor PO intake. Encouraged increased PO intake and to return to clinic if developing burning, pain with urination or abdominal pain.  Orders Placed This Encounter  Procedures  . CBC  . Ambulatory referral to Cardiology    Referral Priority:   Routine    Referral Type:   Consultation    Referral Reason:   Specialty Services Required    Requested Specialty:   Cardiology    Number of Visits Requested:   1   No orders of the defined types were placed in this encounter.   Rory Percy, DO PGY-1, [facility] 08/27/2017 4:23 PM

## 2017-08-27 ENCOUNTER — Telehealth: Payer: Self-pay | Admitting: Family Medicine

## 2017-08-27 ENCOUNTER — Encounter: Payer: Self-pay | Admitting: Family Medicine

## 2017-08-27 ENCOUNTER — Ambulatory Visit (INDEPENDENT_AMBULATORY_CARE_PROVIDER_SITE_OTHER): Payer: Medicare Other | Admitting: Family Medicine

## 2017-08-27 VITALS — BP 97/63 | HR 80 | Temp 98.4°F | Wt 133.0 lb

## 2017-08-27 DIAGNOSIS — I350 Nonrheumatic aortic (valve) stenosis: Secondary | ICD-10-CM | POA: Diagnosis not present

## 2017-08-27 DIAGNOSIS — R34 Anuria and oliguria: Secondary | ICD-10-CM | POA: Diagnosis not present

## 2017-08-27 DIAGNOSIS — D631 Anemia in chronic kidney disease: Secondary | ICD-10-CM | POA: Diagnosis not present

## 2017-08-27 DIAGNOSIS — N184 Chronic kidney disease, stage 4 (severe): Secondary | ICD-10-CM | POA: Diagnosis not present

## 2017-08-27 DIAGNOSIS — Z79899 Other long term (current) drug therapy: Secondary | ICD-10-CM

## 2017-08-27 NOTE — Assessment & Plan Note (Addendum)
Chronic. Newly worsening with dyspnea requiring recent hospitalization. At that time was noted to not be a surgical candidate. - referral placed to follow up with primary cardiologist, Dr. Radford Pax - discussed Palliative Care with patient and family and encouraged close follow up to determine goals of care.

## 2017-08-27 NOTE — Telephone Encounter (Signed)
Daughter ruth wanted dr to know there is very little urination from pt-maybe a teaspoon at a time.  Pt also doesn't have an appetite.  [name] wanted dr to know because pt and other sister may not anwer questions truthfully.  Other sister has refused pallative care. There is a contentious situtation between the 2 sisters.

## 2017-08-27 NOTE — Assessment & Plan Note (Signed)
Denies red flag symptoms. Likely due to poor PO intake. Encouraged increased PO intake and to return to clinic if developing burning, pain with urination or abdominal pain.

## 2017-08-27 NOTE — Patient Instructions (Signed)
It was great to see you!  For your heart failure due to aortic stenosis,  - We are placing a referral to Cardiology to follow up on further management of aortic stenosis as she is not a candidate for surgery. - continue to take Lasix as directed - work on increasing fluid intake. - If your breathing worsens or find yourself gasping for air, you should be evaluated. - Follow back up with Palliative Care as they can help you determine goals of care. - No medication changes today.  We are checking some labs today, we will call you or send you a letter if they are abnormal. We will also fax results to Dr. Alvy Bimler.  Take care and seek immediate care sooner if you develop any concerns.   Rory Percy, DO [facility]

## 2017-08-27 NOTE — Assessment & Plan Note (Signed)
Checking CBC today. Follows with Dr. Alvy Bimler. Will fax results to their office.

## 2017-08-28 LAB — BASIC METABOLIC PANEL
BUN/Creatinine Ratio: 22 (ref 12–28)
BUN: 37 mg/dL — AB (ref 10–36)
CALCIUM: 9.5 mg/dL (ref 8.7–10.3)
CHLORIDE: 95 mmol/L — AB (ref 96–106)
CO2: 31 mmol/L — AB (ref 20–29)
Creatinine, Ser: 1.66 mg/dL — ABNORMAL HIGH (ref 0.57–1.00)
GFR calc non Af Amer: 26 mL/min/{1.73_m2} — ABNORMAL LOW (ref >59)
GFR, EST AFRICAN AMERICAN: 30 mL/min/{1.73_m2} — AB (ref >59)
GLUCOSE: 121 mg/dL — AB (ref 65–99)
POTASSIUM: 4.9 mmol/L (ref 3.5–5.2)
Sodium: 140 mmol/L (ref 134–144)

## 2017-08-28 LAB — CBC
HEMOGLOBIN: 10.2 g/dL — AB (ref 11.1–15.9)
Hematocrit: 31.1 % — ABNORMAL LOW (ref 34.0–46.6)
MCH: 28.5 pg (ref 26.6–33.0)
MCHC: 32.8 g/dL (ref 31.5–35.7)
MCV: 87 fL (ref 79–97)
Platelets: 301 10*3/uL (ref 150–379)
RBC: 3.58 x10E6/uL — AB (ref 3.77–5.28)
RDW: 17.5 % — ABNORMAL HIGH (ref 12.3–15.4)
WBC: 8.1 10*3/uL (ref 3.4–10.8)

## 2017-08-28 NOTE — Progress Notes (Signed)
Please let patient and/or daughters know her hemoglobin is 10.2 and that Dr. Alvy Bimler should be able to see them in her chart, if not we can send them to their office.

## 2017-08-30 ENCOUNTER — Other Ambulatory Visit: Payer: Self-pay

## 2017-08-30 ENCOUNTER — Telehealth: Payer: Self-pay

## 2017-08-30 ENCOUNTER — Ambulatory Visit: Payer: Self-pay

## 2017-08-30 NOTE — Telephone Encounter (Signed)
Called patients daughter regarding today's appt. Canceled appt with daughters okay hemoglobin on October 2 was 10.2. Daughter will call back regarding making future appointment. She said they are still setting up hospice, and she  thinks that they still want to get Aranesp.

## 2017-09-02 ENCOUNTER — Telehealth: Payer: Self-pay | Admitting: *Deleted

## 2017-09-02 ENCOUNTER — Other Ambulatory Visit: Payer: Self-pay | Admitting: *Deleted

## 2017-09-02 MED ORDER — FUROSEMIDE 20 MG PO TABS: 20.0000 mg | ORAL_TABLET | Freq: Every day | ORAL | 1 refills | Status: DC

## 2017-09-02 NOTE — Telephone Encounter (Signed)
Patient's daughter [name] called to speak with provider regarding lab results please give her a call. Number on file is correct.  Derl Barrow, RN

## 2017-09-02 NOTE — Telephone Encounter (Addendum)
Patient's daughter called requesting a call from PCP to discuss patient.  She also requested refill on Lasix.  Refill sent in to pharmacy.  Derl Barrow, RN   Spoke with daughter who called to let us know Imodium took care of diarrhea patient had been experiencing. States she is feeling much better and is up and walking around. Wanted to know status of Lasix refill, informed her it was sent in to the pharmacy. Requests to speak with Dr. McDiarmid to give him an update when he gets a chance.  Rory Percy, DO PGY-1, [facility] 09/02/2017 10:49 AM

## 2017-09-02 NOTE — Telephone Encounter (Signed)
Spoke to patient's daughter earlier this morning. See telephone encounter from today. Stated she also wanted to speak with Dr. McDiarmid when he had a chance.

## 2017-09-02 NOTE — Telephone Encounter (Signed)
Routing to doctor who saw pt as well as doctor covering PCP. Katharina Caper, April D, [place]

## 2017-09-02 NOTE — Telephone Encounter (Signed)
Patients daughter, Yamileth Hayse, had to cancel a business trip due to her mothers health being so poor.  She needs a letter for her work reflecting this. [place], Salome Spotted, CMA

## 2017-09-03 ENCOUNTER — Telehealth: Payer: Self-pay | Admitting: Family Medicine

## 2017-09-03 NOTE — Telephone Encounter (Signed)
Request for letter dropped off for at front desk for completion.  Verified that patient section of form has been completed.  Last DOS/WCC with PCP was 08/27/17.  Placed form in team folder to be completed by clinical staff.  Crista Luria

## 2017-09-03 NOTE — Telephone Encounter (Signed)
I spoke with Gina Gina Wilkinson by phone.  She informed me that her sister, Gina Wilkinson, has contacted Hospice to have a discussion about their mother.  Gina Gina Wilkinson is the agent for [employer]  health care POA.   Gina Gina Wilkinson is sleeping much more since coming home from hospital.  She is no longer walking, preferring to use the wheelchair.  Her weight and BP are stable per Gina Wilkinson.  She does not appear short of breath and does not appear to be in discomfort.  She is taking Lasix 20 mg daily.  Her SCr from last week is relatively stable.  She is to see Dr Lovena Le ([place]) soon.    A/ Very Severe Aortic Stenosis with impaired cardiac output and history of recent cardiogenic pulmonary edema.  - Relatively stable symptoms and SCr on current Lasix 20 mg daily.   Given Gina Wilkinson's cardiogenic pulmonary edema in setting of very severe irreversible aortic stenosis, a prognosis of less than 93-months is very likely which would be her Hospice-eligible diagnosis.  Her recent decline in function to palliative performance scale to 30 to 40% is consistent with this limited survival prognosis.  P/ Await decision about Hospice enrollment by HCPOA agent.

## 2017-09-03 NOTE — Telephone Encounter (Signed)
Will place in Dr. Ky Barban who saw patient and family for this visit. Aliea Bobe,CMA

## 2017-09-04 ENCOUNTER — Encounter: Payer: Self-pay | Admitting: Family Medicine

## 2017-09-04 NOTE — Telephone Encounter (Signed)
LMOVM for [name] to call back.  Wanted to make sure the letter was sufficient and that I could fax other paperwork. Tarica Harl, Salome Spotted, CMA

## 2017-09-04 NOTE — Telephone Encounter (Signed)
Contacted Daughter, [name], this letter will not be sufficient for her job.  Please see MyChart with a more detailed message.  Form was placed back in Dr. Zella Ball chart. Zephyra Bernardi, Salome Spotted, CMA

## 2017-09-04 NOTE — Telephone Encounter (Signed)
Letter provided by Dr. McDiarmid available for patient.

## 2017-09-04 NOTE — Telephone Encounter (Signed)
Form completed and placed in Tamika's box.  Rory Percy, DO PGY-1, [facility] 09/04/2017 10:28 AM

## 2017-09-05 NOTE — Telephone Encounter (Signed)
Spoke with [name].  Letter is sufficient, she has already printed it from [place].   She will come by today and pick up other papers she dropped off.  Placed up front. Murice Barbar, Salome Spotted, CMA

## 2017-09-20 ENCOUNTER — Other Ambulatory Visit: Payer: Self-pay

## 2017-09-20 ENCOUNTER — Ambulatory Visit: Payer: Medicare Other | Admitting: Nurse Practitioner

## 2017-09-20 ENCOUNTER — Ambulatory Visit: Payer: Self-pay

## 2017-10-07 ENCOUNTER — Telehealth: Payer: Self-pay | Admitting: *Deleted

## 2017-10-07 NOTE — Telephone Encounter (Signed)
Message to scheduler 

## 2017-10-07 NOTE — Telephone Encounter (Signed)
OK, she will need labs and inj appt just in case

## 2017-10-07 NOTE — Telephone Encounter (Signed)
Daughter called to see if she can bring Gina Wilkinson in for a lab appt on Friday after 3:00 to check Hgb.

## 2017-10-09 ENCOUNTER — Telehealth: Payer: Self-pay | Admitting: Hematology and Oncology

## 2017-10-09 ENCOUNTER — Telehealth: Payer: Self-pay

## 2017-10-09 NOTE — Telephone Encounter (Signed)
Not needed

## 2017-10-09 NOTE — Telephone Encounter (Signed)
Spoke with patients daughter and scheduled appt per 11/14 los.

## 2017-10-11 ENCOUNTER — Other Ambulatory Visit: Payer: Self-pay | Admitting: Family Medicine

## 2017-10-11 ENCOUNTER — Ambulatory Visit: Payer: Medicare Other

## 2017-10-11 ENCOUNTER — Other Ambulatory Visit (HOSPITAL_BASED_OUTPATIENT_CLINIC_OR_DEPARTMENT_OTHER): Payer: Medicare Other

## 2017-10-11 DIAGNOSIS — E038 Other specified hypothyroidism: Secondary | ICD-10-CM

## 2017-10-11 DIAGNOSIS — N184 Chronic kidney disease, stage 4 (severe): Secondary | ICD-10-CM

## 2017-10-11 DIAGNOSIS — D469 Myelodysplastic syndrome, unspecified: Secondary | ICD-10-CM

## 2017-10-11 DIAGNOSIS — D631 Anemia in chronic kidney disease: Secondary | ICD-10-CM

## 2017-10-11 LAB — CBC WITH DIFFERENTIAL/PLATELET
BASO%: 0.5 % (ref 0.0–2.0)
Basophils Absolute: 0 10*3/uL (ref 0.0–0.1)
EOS%: 4 % (ref 0.0–7.0)
Eosinophils Absolute: 0.2 10*3/uL (ref 0.0–0.5)
HEMATOCRIT: 33.3 % — AB (ref 34.8–46.6)
HEMOGLOBIN: 10.3 g/dL — AB (ref 11.6–15.9)
LYMPH#: 1.3 10*3/uL (ref 0.9–3.3)
LYMPH%: 23 % (ref 14.0–49.7)
MCH: 29.1 pg (ref 25.1–34.0)
MCHC: 30.9 g/dL — AB (ref 31.5–36.0)
MCV: 94.1 fL (ref 79.5–101.0)
MONO#: 0.4 10*3/uL (ref 0.1–0.9)
MONO%: 7.1 % (ref 0.0–14.0)
NEUT#: 3.6 10*3/uL (ref 1.5–6.5)
NEUT%: 65.4 % (ref 38.4–76.8)
PLATELETS: 231 10*3/uL (ref 145–400)
RBC: 3.54 10*6/uL — ABNORMAL LOW (ref 3.70–5.45)
RDW: 18.6 % — AB (ref 11.2–14.5)
WBC: 5.5 10*3/uL (ref 3.9–10.3)

## 2017-10-11 NOTE — Progress Notes (Signed)
HGB was 10.3 did not need injection

## 2017-10-16 ENCOUNTER — Ambulatory Visit (INDEPENDENT_AMBULATORY_CARE_PROVIDER_SITE_OTHER): Payer: Medicare Other | Admitting: Internal Medicine

## 2017-10-16 ENCOUNTER — Encounter: Payer: Self-pay | Admitting: Internal Medicine

## 2017-10-16 VITALS — BP 136/68 | HR 71 | Ht 65.5 in | Wt 132.0 lb

## 2017-10-16 DIAGNOSIS — I350 Nonrheumatic aortic (valve) stenosis: Secondary | ICD-10-CM | POA: Diagnosis not present

## 2017-10-16 DIAGNOSIS — I4821 Permanent atrial fibrillation: Secondary | ICD-10-CM

## 2017-10-16 DIAGNOSIS — I482 Chronic atrial fibrillation: Secondary | ICD-10-CM

## 2017-10-16 DIAGNOSIS — I10 Essential (primary) hypertension: Secondary | ICD-10-CM

## 2017-10-16 MED ORDER — FUROSEMIDE 20 MG PO TABS: ORAL_TABLET | ORAL | 3 refills | Status: AC

## 2017-10-16 NOTE — Patient Instructions (Addendum)
Medication Instructions:  Your physician has recommended you make the following change in your medication:  1.  If you have swelling or shortness of breath, take an extra lasix pill 20 mg (one extra pill daily) until the swelling goes down or shortness of breath improves.  Labwork: None ordered.  Testing/Procedures: None ordered.  Follow-Up: Your physician wants you to follow-up in: one year with Dr. Lovena Le.   You will receive a reminder letter in the mail two months in advance. If you don't receive a letter, please call our office to schedule the follow-up appointment.   Any Other Special Instructions Will Be Listed Below (If Applicable).  1.  Follow a low salt diet  If you need a refill on your cardiac medications before your next appointment, please call your pharmacy.

## 2017-10-16 NOTE — Progress Notes (Signed)
HPI Gina Wilkinson returns today for ongoing evaluation and management of atrial fibrillation, chronic systolic heart failure, hypertension, and aortic stenosis. She has done well in the interim, despite eating too much salt. No syncope, chest pair or sob.  Allergies  Allergen Reactions  . Baclofen Other (See Comments)    Severe altered mental status, extreme sedation   . Levaquin [Levofloxacin] Anaphylaxis  . Penicillins Anaphylaxis    Has patient had a PCN reaction causing immediate rash, facial/tongue/throat swelling, SOB or lightheadedness with hypotension: Yes Has patient had a PCN reaction causing severe rash involving mucus membranes or skin necrosis: No Has patient had a PCN reaction that required hospitalization No Has patient had a PCN reaction occurring within the last 10 years: No If all of the above answers are "NO", then may proceed with Cephalosporin use.   . Sulfonamide Derivatives Anaphylaxis     Current Outpatient Medications  Medication Sig Dispense Refill  . amLODipine (NORVASC) 5 MG tablet TAKE 1 TABLET (5 MG TOTAL) BY MOUTH DAILY. 90 tablet 3  . aspirin 81 MG tablet Take 81 mg by mouth daily.    . Cholecalciferol (VITAMIN D PO) Take 5,000 Units by mouth daily.    . Cyanocobalamin (VITAMIN B-12 PO) Take 3,000 mg by mouth daily.    . Darbepoetin Alfa-Albumin (ARANESP IJ) Inject as directed every 14 (fourteen) days.    . fluticasone (FLONASE) 50 MCG/ACT nasal spray Place 1 spray into both nostrils daily. 1 g 0  . furosemide (LASIX) 20 MG tablet Take 1 tablet (20 mg total) by mouth daily. 30 tablet 1  . OXYGEN Inhale 2 L/min into the lungs. Use oxygen supplementation at least 16 hours per day    . SYNTHROID 88 MCG tablet TAKE 1 TABLET (88 MCG TOTAL) BY MOUTH DAILY. TAKE ON EMPTY STOMACH. 90 tablet 1   No current facility-administered medications for this visit.      Past Medical History:  Diagnosis Date  . Abdominal pain   . Acute on chronic congestive  heart failure ([place])   . Acute respiratory distress   . Acute respiratory failure with hypoxia ([place])   . Anemia, unspecified 07/27/2014  . Aortic stenosis   . Bradycardia 01/18/2016  . Chronic diastolic CHF (congestive heart failure) ([place])   . Chronic respiratory failure ([facility])    a. on home O2 qhs and PRN.  . CKD (chronic kidney disease), stage IV ([place])    a. per review of historical labs.  . Encephalopathy 02/03/2016  . Hematoma 05/11/2016  . Hip fracture ([place]) 11/09/2013  . Hypertension   . Hypothyroidism   . MDS (myelodysplastic syndrome) ([place]) 10/05/2011  . Mild cognitive impairment   . Moderate to severe pulmonary hypertension ([place])   . Neoplasm of uncertain behavior 6/60/6301  . Permanent atrial fibrillation ([place]) dx Mar. 2010   a. Not on anticoag due to myelodysplastic syndrome.  . Pneumonia and influenza   . Pressure injury of skin 01/01/2017  . Pulmonary hypertension ([place])   . RBBB   . Severe aortic stenosis     ROS:   All systems reviewed and negative except as noted in the HPI.   Past Surgical History:  Procedure Laterality Date  . APPENDECTOMY    . ORIF FEMUR FRACTURE  2001   left  . ORIF PERIPROSTHETIC FRACTURE Left 11/12/2013   Procedure: OPEN REDUCTION INTERNAL FIXATION (ORIF) LEFT HIP PERIPROSTHETIC FRACTURE,;  Surgeon: Marianna Payment, MD;  Location: [facility];  Service: Orthopedics;  Laterality: Left;     Family History  Problem Relation Age of Onset  . Appendicitis Mother        Died age 64s-60s of ruptured appendix     Social History   Socioeconomic History  . Marital status: Widowed    Spouse name: Not on file  . Number of children: 2  . Years of education: >16  . Highest education level: Not on file  Social Needs  . Financial resource strain: Not on file  . Food insecurity - worry: Not on file  . Food insecurity - inability: Not on file  . Transportation needs - medical: Not on file  . Transportation needs - non-medical: Not on file    Occupational History  . 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 Smoking status: Never Smoker  . Smokeless tobacco: Never Used  Substance and Sexual Activity  . Alcohol use: No  . Drug use: No  . Sexual activity: Not on file  Other Topics Concern  . Not on file  Social History Narrative  . Not on file     BP 136/68   Pulse 71   Ht 5' 5.5" (1.664 m)   Wt 132 lb (59.9 kg)   SpO2 94%   BMI 21.63 kg/m   Physical Exam:  Well appearing elderly woman, NAD HEENT: Unremarkable Neck:  6 cm JVD, no thyromegally Lymphatics:  No adenopathy Back:  No CVA tenderness Lungs:  Clear HEART:  IRegular rate rhythm, 3/6 systolic murmurs, A2 could not be heard, no rubs, no clicks Abd:  soft, positive bowel sounds, no organomegally, no rebound, no guarding Ext:  2 plus pulses, no edema, no cyanosis, no clubbing Skin:  No rashes no nodules Neuro:  CN II through XII intact, motor grossly intact  EKG - reviewed - atrial fib and a controlled VR with frequent PVC's   Assess/Plan: 1. Atrial fib - her ventricular rate is well controlled. No change in meds.  2. Chronic diastolic heart failure - her symptoms are class I. I have advised her to take an extra Lasix if she has worsening shortness of breath or peripheral edema. 3. Aortic stenosis - by exam she has critical aortic stenosis but fortunately is asymptomatic. With her advanced age and multiple comorbidities, I doubt she'll be a candidate for valve replacement even if she develops symptoms.  Gina Wilkinson, M.D.

## 2017-11-22 ENCOUNTER — Other Ambulatory Visit (HOSPITAL_BASED_OUTPATIENT_CLINIC_OR_DEPARTMENT_OTHER): Payer: Medicare Other

## 2017-11-22 ENCOUNTER — Ambulatory Visit: Payer: Medicare Other

## 2017-11-22 DIAGNOSIS — N184 Chronic kidney disease, stage 4 (severe): Secondary | ICD-10-CM

## 2017-11-22 DIAGNOSIS — D631 Anemia in chronic kidney disease: Secondary | ICD-10-CM

## 2017-11-22 DIAGNOSIS — D469 Myelodysplastic syndrome, unspecified: Secondary | ICD-10-CM

## 2017-11-22 LAB — CBC WITH DIFFERENTIAL/PLATELET
BASO%: 1.9 % (ref 0.0–2.0)
BASOS ABS: 0.1 10*3/uL (ref 0.0–0.1)
EOS%: 5.8 % (ref 0.0–7.0)
Eosinophils Absolute: 0.3 10*3/uL (ref 0.0–0.5)
HEMATOCRIT: 30.9 % — AB (ref 34.8–46.6)
HGB: 10.1 g/dL — ABNORMAL LOW (ref 11.6–15.9)
LYMPH%: 16.8 % (ref 14.0–49.7)
MCH: 30.2 pg (ref 25.1–34.0)
MCHC: 32.7 g/dL (ref 31.5–36.0)
MCV: 92.4 fL (ref 79.5–101.0)
MONO#: 0.6 10*3/uL (ref 0.1–0.9)
MONO%: 10.3 % (ref 0.0–14.0)
NEUT#: 3.6 10*3/uL (ref 1.5–6.5)
NEUT%: 65.2 % (ref 38.4–76.8)
PLATELETS: 236 10*3/uL (ref 145–400)
RBC: 3.35 10*6/uL — AB (ref 3.70–5.45)
RDW: 18.1 % — ABNORMAL HIGH (ref 11.2–14.5)
WBC: 5.5 10*3/uL (ref 3.9–10.3)
lymph#: 0.9 10*3/uL (ref 0.9–3.3)

## 2017-11-22 MED ORDER — DARBEPOETIN ALFA 200 MCG/0.4ML IJ SOSY
PREFILLED_SYRINGE | INTRAMUSCULAR | Status: AC
  Filled 2017-11-22: qty 0.4

## 2017-11-22 NOTE — Progress Notes (Signed)
Hgb is 10.1 today injection is not needed. Went over labs with pt

## 2017-11-29 ENCOUNTER — Telehealth: Payer: Self-pay | Admitting: Hematology and Oncology

## 2017-11-29 ENCOUNTER — Encounter: Payer: Self-pay | Admitting: Family Medicine

## 2017-11-29 NOTE — Telephone Encounter (Signed)
Scheduled appt per 1/2 sch message left message and sent reminder letter in the mail.

## 2017-12-30 ENCOUNTER — Telehealth: Payer: Self-pay | Admitting: Family Medicine

## 2017-12-30 DIAGNOSIS — D631 Anemia in chronic kidney disease: Secondary | ICD-10-CM | POA: Diagnosis not present

## 2017-12-30 DIAGNOSIS — I5032 Chronic diastolic (congestive) heart failure: Secondary | ICD-10-CM | POA: Diagnosis not present

## 2017-12-30 DIAGNOSIS — E039 Hypothyroidism, unspecified: Secondary | ICD-10-CM | POA: Diagnosis not present

## 2017-12-30 DIAGNOSIS — R300 Dysuria: Secondary | ICD-10-CM | POA: Diagnosis not present

## 2017-12-30 NOTE — Telephone Encounter (Signed)
Gina Wilkinson with [facility] called wanting orders for blood drawn for this pt. The daughters of the pt are wanting different things for the pt so they want blood work done to see what they should do. Gina Wilkinson would like orders for one of their nurses there to do blood work for CBC and a metabolic panel to give daughters some information to see if she needs any type of intervention.  Please call Gina Wilkinson at [phone].

## 2017-12-30 NOTE — Telephone Encounter (Signed)
I spoke with Tammie, hh nurse at [facility].  Pt is in their palliative care program.   Dgt not ready to have mother enter Hospice.   Dgt called [facility] Saturday night with concern of decline in her mother.  Pt seen by RN Sunday morning with no report of unusuaql vitals and patient denying difficulties or concerns.  Same on [facility] nurse visit on this morning.   Dgt believes there has been a decline in patient's function and that pt complaining of flank pain.  Dgt concerned about possible pyelonephritis, or metablolic derangement or decline in hgb accounting for her perceived change in pt.   A/ possible change in patient's function (?level of physical fnc, cognition/LOC?)     Flank pain, new P/ Urinalysis and culture (clean catch) CBC with Diff CMET TSH  Fax to [facility] and call vitals from tomorrow hh nurse visit.

## 2017-12-30 NOTE — Telephone Encounter (Signed)
Will forward to MD. Jazmin Hartsell,CMA  

## 2017-12-31 ENCOUNTER — Telehealth: Payer: Self-pay | Admitting: *Deleted

## 2017-12-31 ENCOUNTER — Encounter: Payer: Self-pay | Admitting: Family Medicine

## 2017-12-31 LAB — HEMOGLOBIN
ALBUMIN: 3.4
ALT: 5
AST: 10
Alkaline Phosphatase: 53
BLOOD UA: NEGATIVE
BUN: 44 — AB (ref 4–21)
Creat: 1.67
Hemoglobin: 7.8
KETONES UA: NEGATIVE
MCV: 87.8
Nitrite, UA: NEGATIVE — AB
POTASSIUM BLD: 5.1
Platelets: 370
RDW: 15.1
Sodium: 140
Total Bilirubin: 0.6
Total Protein, Serum: 7.1
WBC: 6.1

## 2017-12-31 NOTE — Telephone Encounter (Signed)
[facility] called to notify Gina Wilkinson that they faxed over this patients lab results and they are concerned about her hemoglobin. These results are in McDiarmids box and they would really like to hear back from him today. Please advise

## 2017-12-31 NOTE — Telephone Encounter (Signed)
Would like to know results from yesterdays labs. Gina Wilkinson, Salome Spotted, CMA

## 2017-12-31 NOTE — Telephone Encounter (Signed)
Will forward to MD to make him aware. Concetta Guion,CMA  

## 2017-12-31 NOTE — Telephone Encounter (Signed)
I spoke with Gina Gina Wilkinson about lab results from 12/30/17.  She reports Wilkinson's variable energy over the last week with increase dyspnea on exertion that was treated with two days of increased lasix to 40 mg for two days without much change.   Gina Wilkinson's behavior has been much the same per daughter along with Gina cognitive function.  Gina appetite has been variable over the last week,  I reviewed notes fax'd to me from [facility] RN visit 12/30/17.  VS P72, RR16, 108/70, 89% - 4 L/m.  Gina Wilkinson told nurse that she would want Gina Wilkinson hospitalized if it would help Gina Wilkinson.  Subjective report and objective findings during visits were unremarkable.   Labs from [employer] drawn at pt's home on 12/30/17 remarkbale for  Hgb 7.8 g/dL  (last Hgb in Epic from 11/22/18 was 10.1).  MCV 88, RDW 15.1   WBC 6.1K Platelet 370 Differential unremarkable.  SCr 1.7 (Scr 08/27/17 1.7) K+ 5.1 Alb 3.4 AST 5 ALT 5 Alk Phos 53  [facility] Urinalysis: 1.016/Ket -/Hgb -/ LE (+)/Nit(-)/ WBC 10-20/ RBC 0-5  A/ Anemia with decrease since 11/12/17 - Hx of MDS and ACD.  Pt on erythropoietin. Uncertain date last dose.   - Possible symptomatic as dgt believes dec energy from dec Hgb. Pyuria - Nonspecific symptoms.  No specific symptoms to urinary tract.  Dgt believes this is a UTI rather than Asymptomatic pyuria and bacteriuria.  - Urine Cx pending  CKD IV stable.   P/ Contact Dr Bethanie Dicker office to see if they would see patient for consider seeing patient sooner.  Await urine culture to use as narrow a spectrum an anbtibiotic as possible for possible asymptomatic bacteriuria. No change in current medication regiment.

## 2018-01-01 ENCOUNTER — Inpatient Hospital Stay (HOSPITAL_BASED_OUTPATIENT_CLINIC_OR_DEPARTMENT_OTHER): Payer: Medicare Other | Admitting: Hematology and Oncology

## 2018-01-01 ENCOUNTER — Encounter: Payer: Self-pay | Admitting: Hematology and Oncology

## 2018-01-01 ENCOUNTER — Inpatient Hospital Stay: Payer: Medicare Other | Attending: Hematology and Oncology

## 2018-01-01 ENCOUNTER — Other Ambulatory Visit: Payer: Self-pay | Admitting: Hematology and Oncology

## 2018-01-01 ENCOUNTER — Telehealth: Payer: Self-pay | Admitting: Family Medicine

## 2018-01-01 ENCOUNTER — Inpatient Hospital Stay: Payer: Medicare Other

## 2018-01-01 VITALS — BP 103/71 | HR 85 | Temp 98.0°F | Resp 18

## 2018-01-01 DIAGNOSIS — I129 Hypertensive chronic kidney disease with stage 1 through stage 4 chronic kidney disease, or unspecified chronic kidney disease: Secondary | ICD-10-CM | POA: Insufficient documentation

## 2018-01-01 DIAGNOSIS — I5032 Chronic diastolic (congestive) heart failure: Secondary | ICD-10-CM

## 2018-01-01 DIAGNOSIS — Z79899 Other long term (current) drug therapy: Secondary | ICD-10-CM

## 2018-01-01 DIAGNOSIS — D539 Nutritional anemia, unspecified: Secondary | ICD-10-CM

## 2018-01-01 DIAGNOSIS — D631 Anemia in chronic kidney disease: Secondary | ICD-10-CM | POA: Diagnosis not present

## 2018-01-01 DIAGNOSIS — Z8781 Personal history of (healed) traumatic fracture: Secondary | ICD-10-CM | POA: Diagnosis not present

## 2018-01-01 DIAGNOSIS — R630 Anorexia: Secondary | ICD-10-CM | POA: Insufficient documentation

## 2018-01-01 DIAGNOSIS — Z7189 Other specified counseling: Secondary | ICD-10-CM | POA: Insufficient documentation

## 2018-01-01 DIAGNOSIS — D469 Myelodysplastic syndrome, unspecified: Secondary | ICD-10-CM

## 2018-01-01 DIAGNOSIS — R531 Weakness: Secondary | ICD-10-CM

## 2018-01-01 DIAGNOSIS — Z9981 Dependence on supplemental oxygen: Secondary | ICD-10-CM | POA: Insufficient documentation

## 2018-01-01 DIAGNOSIS — Z7982 Long term (current) use of aspirin: Secondary | ICD-10-CM | POA: Insufficient documentation

## 2018-01-01 DIAGNOSIS — N184 Chronic kidney disease, stage 4 (severe): Secondary | ICD-10-CM

## 2018-01-01 LAB — CBC WITH DIFFERENTIAL/PLATELET
Basophils Absolute: 0.1 10*3/uL (ref 0.0–0.1)
Basophils Relative: 2 %
Eosinophils Absolute: 0.4 10*3/uL (ref 0.0–0.5)
Eosinophils Relative: 5 %
HCT: 25.2 % — ABNORMAL LOW (ref 34.8–46.6)
HEMOGLOBIN: 8.3 g/dL — AB (ref 11.6–15.9)
LYMPHS ABS: 0.9 10*3/uL (ref 0.9–3.3)
LYMPHS PCT: 12 %
MCH: 29.5 pg (ref 25.1–34.0)
MCHC: 32.8 g/dL (ref 31.5–36.0)
MCV: 89.9 fL (ref 79.5–101.0)
Monocytes Absolute: 0.7 10*3/uL (ref 0.1–0.9)
Monocytes Relative: 9 %
NEUTROS PCT: 72 %
Neutro Abs: 5.5 10*3/uL (ref 1.5–6.5)
Platelets: 450 10*3/uL — ABNORMAL HIGH (ref 145–400)
RBC: 2.8 MIL/uL — AB (ref 3.70–5.45)
RDW: 17 % — ABNORMAL HIGH (ref 11.2–14.5)
WBC: 7.6 10*3/uL (ref 3.9–10.3)

## 2018-01-01 LAB — SAMPLE TO BLOOD BANK

## 2018-01-01 LAB — PREPARE RBC (CROSSMATCH)

## 2018-01-01 MED ORDER — SODIUM CHLORIDE 0.9 % IV SOLN
250.0000 mL | Freq: Once | INTRAVENOUS | Status: AC
  Administered 2018-01-01: 250 mL via INTRAVENOUS

## 2018-01-01 MED ORDER — ACETAMINOPHEN 325 MG PO TABS
ORAL_TABLET | ORAL | Status: AC
  Filled 2018-01-01: qty 2

## 2018-01-01 MED ORDER — ACETAMINOPHEN 325 MG PO TABS
650.0000 mg | ORAL_TABLET | Freq: Once | ORAL | Status: AC
  Administered 2018-01-01: 650 mg via ORAL

## 2018-01-01 NOTE — Patient Instructions (Addendum)
Blood Transfusion, Adult A blood transfusion is a procedure in which you receive donated blood, including plasma, platelets, and red blood cells, through an IV tube. You may need a blood transfusion because of illness, surgery, or injury. The blood may come from a donor. You may also be able to donate blood for yourself (autologous blood donation) before a surgery if you know that you might require a blood transfusion. The blood given in a transfusion is made up of different types of cells. You may receive:  Red blood cells. These carry oxygen to the cells in the body.  White blood cells. These help you fight infections.  Platelets. These help your blood to clot.  Plasma. This is the liquid part of your blood and it helps with fluid imbalances.  If you have hemophilia or another clotting disorder, you may also receive other types of blood products. Tell a health care provider about:  Any allergies you have.  All medicines you are taking, including vitamins, herbs, eye drops, creams, and over-the-counter medicines.  Any problems you or family members have had with anesthetic medicines.  Any blood disorders you have.  Any surgeries you have had.  Any medical conditions you have, including any recent fever or cold symptoms.  Whether you are pregnant or may be pregnant.  Any previous reactions you have had during a blood transfusion. What are the risks? Generally, this is a safe procedure. However, problems may occur, including:  Having an allergic reaction to something in the donated blood. Hives and itching may be symptoms of this type of reaction.  Fever. This may be a reaction to the white blood cells in the transfused blood. Nausea or chest pain may accompany a fever.  Iron overload. This can happen from having many transfusions.  Transfusion-related acute lung injury (TRALI). This is a rare reaction that causes lung damage. The cause is not known.TRALI can occur within hours  of a transfusion or several days later.  Sudden (acute) or delayed hemolytic reactions. This happens if your blood does not match the cells in your transfusion. Your body's defense system (immune system) may try to attack the new cells. This complication is rare. The symptoms include fever, chills, nausea, and low back pain or chest pain.  Infection or disease transmission. This is rare.  What happens before the procedure?  You will have a blood test to determine your blood type. This is necessary to know what kind of blood your body will accept and to match it to the donor blood.  If you are going to have a planned surgery, you may be able to do an autologous blood donation. This may be done in case you need to have a transfusion.  If you have had an allergic reaction to a transfusion in the past, you may be given medicine to help prevent a reaction. This medicine may be given to you by mouth or through an IV tube.  You will have your temperature, blood pressure, and pulse monitored before the transfusion.  Follow instructions from your health care provider about eating and drinking restrictions.  Ask your health care provider about: ? Changing or stopping your regular medicines. This is especially important if you are taking diabetes medicines or blood thinners. ? Taking medicines such as aspirin and ibuprofen. These medicines can thin your blood. Do not take these medicines before your procedure if your health care provider instructs you not to. What happens during the procedure?  An IV tube will   be inserted into one of your veins.  The bag of donated blood will be attached to your IV tube. The blood will then enter through your vein.  Your temperature, blood pressure, and pulse will be monitored regularly during the transfusion. This monitoring is done to detect early signs of a transfusion reaction.  If you have any signs or symptoms of a reaction, your transfusion will be stopped  and you may be given medicine.  When the transfusion is complete, your IV tube will be removed.  Pressure may be applied to the IV site for a few minutes.  A bandage (dressing) will be applied. The procedure may vary among health care providers and hospitals. What happens after the procedure?  Your temperature, blood pressure, heart rate, breathing rate, and blood oxygen level will be monitored often.  Your blood may be tested to see how you are responding to the transfusion.  You may be warmed with fluids or blankets to maintain a normal body temperature. Summary  A blood transfusion is a procedure in which you receive donated blood, including plasma, platelets, and red blood cells, through an IV tube.  Your temperature, blood pressure, and pulse will be monitored before, during, and after the transfusion.  Your blood may be tested after the transfusion to see how your body has responded. This information is not intended to replace advice given to you by your health care provider. Make sure you discuss any questions you have with your health care provider. Document Released: 11/09/2000 Document Revised: 08/09/2016 Document Reviewed: 08/09/2016 Elsevier Interactive Patient Education  2018 [place].  Blood Transfusion, Adult, Care After This sheet gives you information about how to care for yourself after your procedure. Your health care provider may also give you more specific instructions. If you have problems or questions, contact your health care provider. What can I expect after the procedure? After your procedure, it is common to have:  Bruising and soreness where the IV tube was inserted.  Headache.  Follow these instructions at home:  Take over-the-counter and prescription medicines only as told by your health care provider.  Return to your normal activities as told by your health care provider.  Follow instructions from your health care provider about how to take  care of your IV insertion site. Make sure you: ? Wash your hands with soap and water before you change your bandage (dressing). If soap and water are not available, use hand sanitizer. ? Change your dressing as told by your health care provider.  Check your IV insertion site every day for signs of infection. Check for: ? More redness, swelling, or pain. ? More fluid or blood. ? Warmth. ? Pus or a bad smell. Contact a health care provider if:  You have more redness, swelling, or pain around the IV insertion site.  You have more fluid or blood coming from the IV insertion site.  Your IV insertion site feels warm to the touch.  You have pus or a bad smell coming from the IV insertion site.  Your urine turns pink, red, or brown.  You feel weak after doing your normal activities. Get help right away if:  You have signs of a serious allergic or immune system reaction, including: ? Itchiness. ? Hives. ? Trouble breathing. ? Anxiety. ? Chest or lower back pain. ? Fever, flushing, and chills. ? Rapid pulse. ? Rash. ? Diarrhea. ? Vomiting. ? Dark urine. ? Serious headache. ? Dizziness. ? Stiff neck. ? Yellow coloration  of the face or the white parts of the eyes (jaundice). This information is not intended to replace advice given to you by your health care provider. Make sure you discuss any questions you have with your health care provider. Document Released: 12/03/2014 Document Revised: 07/11/2016 Document Reviewed: 05/28/2016 Elsevier Interactive Patient Education  [employer].

## 2018-01-01 NOTE — Assessment & Plan Note (Signed)
The patient is weak and frail She is currently on home based palliative care Her primary care doctor has discussed hospice with the patient and family but given the fact that they desire active management with blood draw, blood transfusion, etc., it appears that there are not willing to accept hospice I would defer to palliative care to continue supportive care at home

## 2018-01-01 NOTE — Assessment & Plan Note (Signed)
I have not seen the patient for some time I was under the impression that she was enrolled in hospice Primary care doctor confirmed that the patient is only on home based palliative care but not hospice The patient and family members desire resumption of blood count monitoring, darbepoetin injection and transfusion support as needed The patient is very weak and frail Even though her blood count is borderline, given  she has symptoms of anemia, I recommend blood transfusion today. We discussed some of the risks, benefits, and alternatives of blood transfusions. The patient is symptomatic from anemia and the hemoglobin level is critically low.  Some of the side-effects to be expected including risks of transfusion reactions, chills, infection, syndrome of volume overload and risk of hospitalization from various reasons and the patient is willing to proceed and went ahead to sign consent today. After blood transfusion today, she will resume blood count monitoring and darbepoetin injection once every 6 weeks as before

## 2018-01-01 NOTE — Assessment & Plan Note (Signed)
She has stable stage IV chronic kidney disease Continue monitoring

## 2018-01-01 NOTE — Assessment & Plan Note (Addendum)
She has chronic congestive heart failure Clinically, she appears dehydrated She does not have signs or symptoms of fluid overload today She is hypotensive For that reason, I would not give Lasix after blood transfusion Her risk of transfusion reaction/overload is acceptable and the risks I discussed with the patient and family

## 2018-01-01 NOTE — Progress Notes (Signed)
[facility] OFFICE PROGRESS NOTE  Patient Care Team: McDiarmid, Blane Ohara, MD as PCP - General (Family Medicine) Evans Lance, MD as Consulting Physician (Cardiology) Heath Lark, MD as Consulting Physician (Hematology and Oncology)  SUMMARY OF ONCOLOGIC HISTORY:  This pleasant lady was diagnosed with a low-grade myelodysplastic syndrome characterized by isolated normochromic anemia.  Bone marrow done 02/01/2009 was hypercellular for age. Mild dyserythropoiesis. 3% blasts. Normal cytogenetics. No ring sideroblasts. Hemoglobin 9.8 at that time with platelet count 367,000. Ferritin 329. Erythropoietin 69.9. She has had a durable response to intermittent erythropoietin injections currently on Aranesp 300 mcg every 4 weeks. In December 2014, the patient had hip fracture requiring surgery and blood transfusion. In May 2015, we increased Darbopoetin to 500 mcg every 4 weeks. The last dose of injection was November 2015 From 01/14/2017 July 2018 she received darbepoetin injection again due to anemia.  INTERVAL HISTORY: Please see below for problem oriented charting. I received a telephone call from her primary care doctor, requesting urgent evaluation and treatment for her anemia earlier today Since last time I saw her, I have discontinued follow-up because I was informed that she was enrolled in palliative care/hospice However, this does not appear to be the case Family members noted that the patient is getting weaker and felt that anemia should be corrected to give her symptomatic improvement Repeat hemoglobin 2 days ago was low as 7.8 The patient is chronically weak.  She is on oxygen therapy for shortness of breath She denies chest pain She has very poor intake and appears dehydrated today There were no recent reported history of nausea or vomiting She was recently treated for unspecified infection  REVIEW OF SYSTEMS:   Constitutional: Denies fevers, chills or abnormal weight  loss Eyes: Denies blurriness of vision Ears, nose, mouth, throat, and face: Denies mucositis or sore throat Gastrointestinal:  Denies nausea, heartburn or change in bowel habits Skin: Denies abnormal skin rashes Lymphatics: Denies new lymphadenopathy or easy bruising Behavioral/Psych: Mood is stable, no new changes  All other systems were reviewed with the patient and are negative.  I have reviewed the past medical history, past surgical history, social history and family history with the patient and they are unchanged from previous note.  ALLERGIES:  is allergic to baclofen; levaquin [levofloxacin]; penicillins; and sulfonamide derivatives.  MEDICATIONS:  Current Outpatient Medications  Medication Sig Dispense Refill  . amLODipine (NORVASC) 5 MG tablet TAKE 1 TABLET (5 MG TOTAL) BY MOUTH DAILY. 90 tablet 3  . aspirin 81 MG tablet Take 81 mg by mouth daily.    . Cholecalciferol (VITAMIN D PO) Take 5,000 Units by mouth daily.    . Cyanocobalamin (VITAMIN B-12 PO) Take 3,000 mg by mouth daily.    . Darbepoetin Alfa-Albumin (ARANESP IJ) Inject as directed every 14 (fourteen) days.    . fluticasone (FLONASE) 50 MCG/ACT nasal spray Place 1 spray into both nostrils daily. 1 g 0  . furosemide (LASIX) 20 MG tablet Take one tablet by mouth daily.  May take one extra tablet daily for edema. 120 tablet 3  . OXYGEN Inhale 2 L/min into the lungs. Use oxygen supplementation at least 16 hours per day    . SYNTHROID 88 MCG tablet TAKE 1 TABLET (88 MCG TOTAL) BY MOUTH DAILY. TAKE ON EMPTY STOMACH. 90 tablet 1   No current facility-administered medications for this visit.     PHYSICAL EXAMINATION: ECOG PERFORMANCE STATUS: 3 - Symptomatic, >50% confined to bed  Vitals:  01/01/18 1319  BP: 103/71  Pulse: 85  Resp: 18  Temp: 98 F (36.7 C)  SpO2: 90%   There were no vitals filed for this visit.  GENERAL:alert, appears elderly and frail, sitting on the wheelchair with oxygen SKIN: skin color  is pale, texture, turgor are normal, no rashes or significant lesions EYES: normal, Conjunctiva are  Pale  and non-injected, sclera clear OROPHARYNX:no exudate, no erythema and lips, buccal mucosa, and tongue normal  NECK: supple, thyroid normal size, non-tender, without nodularity LYMPH:  no palpable lymphadenopathy in the cervical, axillary or inguinal LUNGS: Noted weak respiratory effort.  Expiratory wheezes are noted. HEART: Loud ejection systolic murmur with mild bilateral lower extremity edema ABDOMEN:abdomen soft, non-tender and normal bowel sounds Musculoskeletal:no cyanosis of digits and no clubbing  NEURO: alert & oriented x 3 with fluent speech, no focal motor/sensory deficits  LABORATORY DATA:  I have reviewed the data as listed    Component Value Date/Time   NA 140 12/30/2017   NA 140 08/08/2017 1431   K 5.1 12/30/2017   K 4.9 08/27/2017 1534   K 4.3 08/08/2017 1431   CL 95 (L) 08/27/2017 1534   CL 105 07/25/2012 1005   CO2 31 (H) 08/27/2017 1534   CO2 28 08/08/2017 1431   GLUCOSE 121 (H) 08/27/2017 1534   GLUCOSE 94 08/20/2017 0403   GLUCOSE 138 08/08/2017 1431   GLUCOSE 142 (H) 07/25/2012 1005   BUN 44 (A) 12/30/2017   BUN 30.1 (H) 08/08/2017 1431   CREATININE 1.67 12/30/2017   CREATININE 1.8 (H) 08/08/2017 1431   CALCIUM 9.5 08/27/2017 1534   CALCIUM 10.2 08/08/2017 1431   PROT 6.6 08/17/2017 0349   PROT CANCELED 04/25/2017 1011   PROT 7.7 12/14/2014 1508   ALBUMIN 3.4 12/30/2017   ALBUMIN 4.0 12/14/2014 1508   AST 10 12/30/2017   AST 15 12/14/2014 1508   ALT 5 12/30/2017   ALT 8 12/14/2014 1508   ALKPHOS 53 12/30/2017   ALKPHOS 78 12/14/2014 1508   BILITOT 0.6 12/30/2017   BILITOT 0.72 12/14/2014 1508   GFRNONAA 26 (L) 08/27/2017 1534   GFRNONAA 28 (L) 10/03/2016 0957   GFRAA 30 (L) 08/27/2017 1534   GFRAA 33 (L) 10/03/2016 0957    No results found for: SPEP, UPEP  Lab Results  Component Value Date   WBC 7.6 01/01/2018   NEUTROABS 5.5  01/01/2018   HGB 8.3 (L) 01/01/2018   HCT 25.2 (L) 01/01/2018   MCV 89.9 01/01/2018   PLT 450 (H) 01/01/2018      Chemistry      Component Value Date/Time   NA 140 12/30/2017   NA 140 08/08/2017 1431   K 5.1 12/30/2017   K 4.9 08/27/2017 1534   K 4.3 08/08/2017 1431   CL 95 (L) 08/27/2017 1534   CL 105 07/25/2012 1005   CO2 31 (H) 08/27/2017 1534   CO2 28 08/08/2017 1431   BUN 44 (A) 12/30/2017   BUN 30.1 (H) 08/08/2017 1431   CREATININE 1.67 12/30/2017   CREATININE 1.8 (H) 08/08/2017 1431      Component Value Date/Time   CALCIUM 9.5 08/27/2017 1534   CALCIUM 10.2 08/08/2017 1431   ALKPHOS 53 12/30/2017   ALKPHOS 78 12/14/2014 1508   AST 10 12/30/2017   AST 15 12/14/2014 1508   ALT 5 12/30/2017   ALT 8 12/14/2014 1508   BILITOT 0.6 12/30/2017   BILITOT 0.72 12/14/2014 1508      ASSESSMENT & PLAN:  MDS (  myelodysplastic syndrome) ([place]) I have not seen the patient for some time I was under the impression that she was enrolled in hospice Primary care doctor confirmed that the patient is only on home based palliative care but not hospice The patient and family members desire resumption of blood count monitoring, darbepoetin injection and transfusion support as needed The patient is very weak and frail Even though her blood count is borderline, given  she has symptoms of anemia, I recommend blood transfusion today. We discussed some of the risks, benefits, and alternatives of blood transfusions. The patient is symptomatic from anemia and the hemoglobin level is critically low.  Some of the side-effects to be expected including risks of transfusion reactions, chills, infection, syndrome of volume overload and risk of hospitalization from various reasons and the patient is willing to proceed and went ahead to sign consent today. After blood transfusion today, she will resume blood count monitoring and darbepoetin injection once every 6 weeks as before  Anemia of chronic renal  failure, stage 4 (severe) (HCC) She has stable stage IV chronic kidney disease Continue monitoring  Chronic diastolic heart failure ([place]) She has chronic congestive heart failure Clinically, she appears dehydrated She does not have signs or symptoms of fluid overload today She is hypotensive For that reason, I would not give Lasix after blood transfusion Her risk of transfusion reaction/overload is acceptable and the risks I discussed with the patient and family  Goals of care, counseling/discussion The patient is weak and frail She is currently on home based palliative care Her primary care doctor has discussed hospice with the patient and family but given the fact that they desire active management with blood draw, blood transfusion, etc., it appears that there are not willing to accept hospice I would defer to palliative care to continue supportive care at home   Orders Placed This Encounter  Procedures  . Iron and TIBC    Standing Status:   Future    Standing Expiration Date:   02/05/2019  . Vitamin B12    Standing Status:   Future    Standing Expiration Date:   02/05/2019  . Ferritin    Standing Status:   Future    Standing Expiration Date:   02/05/2019  . Erythropoietin    Standing Status:   Future    Standing Expiration Date:   02/05/2019   All questions were answered. The patient knows to call the clinic with any problems, questions or concerns. No barriers to learning was detected. I spent 30 minutes counseling the patient face to face. The total time spent in the appointment was 40 minutes and more than 50% was on counseling and review of test results     Heath Lark, MD 01/01/2018 4:57 PM

## 2018-01-01 NOTE — Telephone Encounter (Signed)
Please let Ms Slape know that Dr McDiarmid discussed results with her sister. Only significant change was decrease in hgb to 7.8.  Trying to make appointment with Dr Alvy Bimler.

## 2018-01-01 NOTE — Telephone Encounter (Signed)
I spoke with Dr Alvy Bimler about decline in Ms Simonis's decline in hgb with accompanying decrease in energy and DOE increase.  She thought that a PRBC transfusion may be appropriate vs erythropoietin which may take longer to result in RBC increase.  I relayed this conversation with [place] caretaker and [place], [name], who will discuss with Dr Calton Dach office whether a prbc xfusion or a erythropoietin injection would meet her mother's condition best.

## 2018-01-02 ENCOUNTER — Telehealth: Payer: Self-pay | Admitting: *Deleted

## 2018-01-02 LAB — BPAM RBC
Blood Product Expiration Date: 201902222359
ISSUE DATE / TIME: 201902061702
Unit Type and Rh: 6200

## 2018-01-02 LAB — TYPE AND SCREEN
ABO/RH(D): A POS
Antibody Screen: POSITIVE
UNIT DIVISION: 0

## 2018-01-02 NOTE — Telephone Encounter (Signed)
Gina Wilkinson called nurse line to verify that we receieved abnormal results on pt.    Informed [name] that results are in providers box to review. Gina Wilkinson, Salome Spotted, CMA

## 2018-01-03 NOTE — Telephone Encounter (Signed)
Please let Gina Wilkinson know that Dr Karesa Maultsby reviewed urine culture result with Proteus growing.  He discussed this finding with patient's daughter, [name].  The plan is to treat as asymptomatic bacteriuria for now with pushing fluids for next 24 to 48 hours and monitoring for symptoms more specific to a cystitis.

## 2018-01-03 NOTE — Telephone Encounter (Signed)
I spoke with Ms Gina Wilkinson, primary caretaker and HCPOA agent for Mrs Gina Wilkinson.  She told me that her mother is much improved after blood transfusion at Dr Calton Dach office on 12/31/17.  Her mother is not complaining of dyduria, increase urgency, increase frequency, nor suprapubic pain.  No fever/chills. She is at her usual level of activity and cognition.   We discussed very good possibilty of the Proteus mirabilis grown in her mother's CC urine culture from 12/30/17 represents asymptomatic bacteriuria rather than UTI, especially since her mother's malaise has resolved with Xfusion and her mother history of anaphylactice reactions to penicillins, sulfa, and flouroquinolone antibiotics in past.    For now, will treat urine culture of Proteus as asx bacteriuria with recommendation to push fluids for next 24 to 48 hours.   Ms Gina Wilkinson will monitor her mother for development of symptoms and signs more specific for a UTI and let our office know if they develop.  After consultation with pharmacy, fosfomycin oral would be an option to cover the Proteus, though I would like to have patient in a controlled environment for initial administration, like ED, to monitor for anaphylaxis.

## 2018-01-13 ENCOUNTER — Telehealth: Payer: Self-pay | Admitting: *Deleted

## 2018-01-13 NOTE — Telephone Encounter (Signed)
Received call from [place] stating that daughter called @ [facility] & states that Dr Alvy Bimler had suggested a nebulizer & asked if this would be appropriate.  Pt uses [facility] for oxygen services & pharmacy is [facility].  Message to Dr Alvy Bimler.

## 2018-01-13 NOTE — Telephone Encounter (Signed)
Called Olivia Mackie back & informed of Dr Calton Dach response.

## 2018-01-13 NOTE — Telephone Encounter (Signed)
I am sorry I am not the physician in file for her care at home including, oxygen, nebs, etc. Family needs to contact her PCP to get these orders

## 2018-01-16 ENCOUNTER — Telehealth: Payer: Self-pay | Admitting: Family Medicine

## 2018-01-16 ENCOUNTER — Other Ambulatory Visit: Payer: Self-pay | Admitting: Family Medicine

## 2018-01-16 DIAGNOSIS — I272 Pulmonary hypertension, unspecified: Secondary | ICD-10-CM | POA: Diagnosis not present

## 2018-01-16 DIAGNOSIS — I4891 Unspecified atrial fibrillation: Secondary | ICD-10-CM | POA: Diagnosis not present

## 2018-01-16 DIAGNOSIS — D469 Myelodysplastic syndrome, unspecified: Secondary | ICD-10-CM | POA: Diagnosis not present

## 2018-01-16 DIAGNOSIS — D631 Anemia in chronic kidney disease: Secondary | ICD-10-CM | POA: Diagnosis not present

## 2018-01-16 DIAGNOSIS — I350 Nonrheumatic aortic (valve) stenosis: Secondary | ICD-10-CM | POA: Diagnosis not present

## 2018-01-16 DIAGNOSIS — I5032 Chronic diastolic (congestive) heart failure: Secondary | ICD-10-CM | POA: Diagnosis not present

## 2018-01-16 DIAGNOSIS — Z9981 Dependence on supplemental oxygen: Secondary | ICD-10-CM | POA: Diagnosis not present

## 2018-01-16 DIAGNOSIS — E46 Unspecified protein-calorie malnutrition: Secondary | ICD-10-CM | POA: Diagnosis not present

## 2018-01-16 DIAGNOSIS — N184 Chronic kidney disease, stage 4 (severe): Secondary | ICD-10-CM | POA: Diagnosis not present

## 2018-01-16 DIAGNOSIS — R0600 Dyspnea, unspecified: Secondary | ICD-10-CM

## 2018-01-16 DIAGNOSIS — Z515 Encounter for palliative care: Secondary | ICD-10-CM

## 2018-01-16 MED ORDER — LORAZEPAM 0.5 MG PO TABS: 0.2500 mg | ORAL_TABLET | ORAL | 1 refills | Status: AC | PRN

## 2018-01-16 MED ORDER — MORPHINE SULFATE 20 MG/5ML PO SOLN: 5.0000 mg | ORAL | 0 refills | Status: AC | PRN

## 2018-01-16 NOTE — Telephone Encounter (Signed)
I spoke with Olivia Mackie, RN for home health palliative care with [facility], who was at home of [place]. V. Munley's dgts, [name] an Indie Boehne, are at the patient's home.  Olivia Mackie reports that the patient is experiencing respiratory distress with she lays down.  SBP 100, RR 24, 70% on 4 liters, improves to 86% when getting up to the commode, She has received 30 mg Lasix oral yesterday, and 40 mg oral today,    Dgts told the RN that they did not want their mother hospitalized.  The want to keep her at home.   At this time, the dgts have not yet chosen to enroll in hospice.  They would like a CBC to see if the pt's Hgb is low.   A/P Hypoxic Respiratory Distress with orthopnea component. - Symptom palliation: Lasix 40 mg daily, Ativan 0.25 mg PO every 1 - 2 hours, prn dyspnea, increased WOB; Morphine Sulfate IR 5 mg by mouth every 1 - 2 hours prn dyspnea, increased WOB. - Home draw of CBC to assess Hgb level   - [facility] RN to continue monitoring patient with emphasis on symptoms relief and goal to avoid ED visit or Hospitalization.  - HH RN to contact Dr Agam Tuohy with updates and need for further palliative interventions orders.

## 2018-01-17 DIAGNOSIS — N184 Chronic kidney disease, stage 4 (severe): Secondary | ICD-10-CM | POA: Diagnosis not present

## 2018-01-17 DIAGNOSIS — I350 Nonrheumatic aortic (valve) stenosis: Secondary | ICD-10-CM | POA: Diagnosis not present

## 2018-01-17 DIAGNOSIS — I4891 Unspecified atrial fibrillation: Secondary | ICD-10-CM | POA: Diagnosis not present

## 2018-01-17 DIAGNOSIS — Z9981 Dependence on supplemental oxygen: Secondary | ICD-10-CM | POA: Diagnosis not present

## 2018-01-17 DIAGNOSIS — I5032 Chronic diastolic (congestive) heart failure: Secondary | ICD-10-CM | POA: Diagnosis not present

## 2018-01-17 DIAGNOSIS — I272 Pulmonary hypertension, unspecified: Secondary | ICD-10-CM | POA: Diagnosis not present

## 2018-01-18 DIAGNOSIS — Z9981 Dependence on supplemental oxygen: Secondary | ICD-10-CM | POA: Diagnosis not present

## 2018-01-18 DIAGNOSIS — N184 Chronic kidney disease, stage 4 (severe): Secondary | ICD-10-CM | POA: Diagnosis not present

## 2018-01-18 DIAGNOSIS — I5032 Chronic diastolic (congestive) heart failure: Secondary | ICD-10-CM | POA: Diagnosis not present

## 2018-01-18 DIAGNOSIS — I4891 Unspecified atrial fibrillation: Secondary | ICD-10-CM | POA: Diagnosis not present

## 2018-01-18 DIAGNOSIS — I350 Nonrheumatic aortic (valve) stenosis: Secondary | ICD-10-CM | POA: Diagnosis not present

## 2018-01-18 DIAGNOSIS — I272 Pulmonary hypertension, unspecified: Secondary | ICD-10-CM | POA: Diagnosis not present

## 2018-01-19 DIAGNOSIS — I4891 Unspecified atrial fibrillation: Secondary | ICD-10-CM | POA: Diagnosis not present

## 2018-01-19 DIAGNOSIS — N184 Chronic kidney disease, stage 4 (severe): Secondary | ICD-10-CM | POA: Diagnosis not present

## 2018-01-19 DIAGNOSIS — I350 Nonrheumatic aortic (valve) stenosis: Secondary | ICD-10-CM | POA: Diagnosis not present

## 2018-01-19 DIAGNOSIS — Z9981 Dependence on supplemental oxygen: Secondary | ICD-10-CM | POA: Diagnosis not present

## 2018-01-19 DIAGNOSIS — I5032 Chronic diastolic (congestive) heart failure: Secondary | ICD-10-CM | POA: Diagnosis not present

## 2018-01-19 DIAGNOSIS — I272 Pulmonary hypertension, unspecified: Secondary | ICD-10-CM | POA: Diagnosis not present

## 2018-01-20 DIAGNOSIS — Z9981 Dependence on supplemental oxygen: Secondary | ICD-10-CM | POA: Diagnosis not present

## 2018-01-20 DIAGNOSIS — I5032 Chronic diastolic (congestive) heart failure: Secondary | ICD-10-CM | POA: Diagnosis not present

## 2018-01-20 DIAGNOSIS — I4891 Unspecified atrial fibrillation: Secondary | ICD-10-CM | POA: Diagnosis not present

## 2018-01-20 DIAGNOSIS — I272 Pulmonary hypertension, unspecified: Secondary | ICD-10-CM | POA: Diagnosis not present

## 2018-01-20 DIAGNOSIS — I350 Nonrheumatic aortic (valve) stenosis: Secondary | ICD-10-CM | POA: Diagnosis not present

## 2018-01-20 DIAGNOSIS — N184 Chronic kidney disease, stage 4 (severe): Secondary | ICD-10-CM | POA: Diagnosis not present

## 2018-01-22 DIAGNOSIS — I272 Pulmonary hypertension, unspecified: Secondary | ICD-10-CM | POA: Diagnosis not present

## 2018-01-22 DIAGNOSIS — I4891 Unspecified atrial fibrillation: Secondary | ICD-10-CM | POA: Diagnosis not present

## 2018-01-22 DIAGNOSIS — N184 Chronic kidney disease, stage 4 (severe): Secondary | ICD-10-CM | POA: Diagnosis not present

## 2018-01-22 DIAGNOSIS — I5032 Chronic diastolic (congestive) heart failure: Secondary | ICD-10-CM | POA: Diagnosis not present

## 2018-01-22 DIAGNOSIS — Z9981 Dependence on supplemental oxygen: Secondary | ICD-10-CM | POA: Diagnosis not present

## 2018-01-22 DIAGNOSIS — I350 Nonrheumatic aortic (valve) stenosis: Secondary | ICD-10-CM | POA: Diagnosis not present

## 2018-01-23 ENCOUNTER — Ambulatory Visit: Payer: Medicare Other

## 2018-01-23 ENCOUNTER — Telehealth: Payer: Self-pay

## 2018-01-23 ENCOUNTER — Other Ambulatory Visit: Payer: Medicare Other

## 2018-01-23 NOTE — Telephone Encounter (Signed)
Daughter called and left message. She said the hospice nurse came out yesterday. Requesting Dr. Alvy Bimler send a order for lab work to save her Mom a trip. Then she would bring in for injection of needed.

## 2018-01-23 NOTE — Telephone Encounter (Signed)
Called and left message for [place], nurse with [facility]. Next injection appt is scheduled for 4/29. Requesting lab work one week before appt.

## 2018-01-23 NOTE — Telephone Encounter (Signed)
I was not aware that hospice will pay for Aranesp. We can get labs drawn by hospice, just a CBC.  PLease double check with hospice nurse because I will not be responsible if they cannot get reimbursement for Aranesp

## 2018-01-24 ENCOUNTER — Telehealth: Payer: Self-pay

## 2018-01-24 ENCOUNTER — Other Ambulatory Visit: Payer: Medicare Other

## 2018-01-24 ENCOUNTER — Telehealth: Payer: Self-pay | Admitting: *Deleted

## 2018-01-24 ENCOUNTER — Ambulatory Visit: Payer: Medicare Other

## 2018-01-24 DIAGNOSIS — D469 Myelodysplastic syndrome, unspecified: Secondary | ICD-10-CM | POA: Diagnosis not present

## 2018-01-24 DIAGNOSIS — N184 Chronic kidney disease, stage 4 (severe): Secondary | ICD-10-CM | POA: Diagnosis not present

## 2018-01-24 DIAGNOSIS — I4891 Unspecified atrial fibrillation: Secondary | ICD-10-CM | POA: Diagnosis not present

## 2018-01-24 DIAGNOSIS — E46 Unspecified protein-calorie malnutrition: Secondary | ICD-10-CM | POA: Diagnosis not present

## 2018-01-24 DIAGNOSIS — I5032 Chronic diastolic (congestive) heart failure: Secondary | ICD-10-CM | POA: Diagnosis not present

## 2018-01-24 DIAGNOSIS — I272 Pulmonary hypertension, unspecified: Secondary | ICD-10-CM | POA: Diagnosis not present

## 2018-01-24 DIAGNOSIS — D631 Anemia in chronic kidney disease: Secondary | ICD-10-CM | POA: Diagnosis not present

## 2018-01-24 DIAGNOSIS — Z9981 Dependence on supplemental oxygen: Secondary | ICD-10-CM | POA: Diagnosis not present

## 2018-01-24 DIAGNOSIS — I350 Nonrheumatic aortic (valve) stenosis: Secondary | ICD-10-CM | POA: Diagnosis not present

## 2018-01-24 NOTE — Telephone Encounter (Signed)
C from patient's daughter, [name]. She is requesting her mother's next lab appt/aranesp injection be changed from 03/24/18 to a date within the next 2 weeks.. She feels 03/24/18 is too far in the future. Scheduling request sent.

## 2018-01-24 NOTE — Telephone Encounter (Signed)
Called and left message requesting cbc on 3/11. Ask her to call nurse back.

## 2018-01-24 NOTE — Telephone Encounter (Signed)
Hi Hassan Rowan,  Can you call the daughter? It might be easiest to get home care to draw CBC at home first to save her a trip.

## 2018-01-24 NOTE — Telephone Encounter (Signed)
Called and left a message for daughter to call nurse back.

## 2018-01-24 NOTE — Telephone Encounter (Signed)
Called daughter back and told her I left a message with Colletta Maryland nurse with [facility] requesting CBC on 3/11. Verbalized understanding.

## 2018-01-24 NOTE — Telephone Encounter (Signed)
Gina Wilkinson nurse with [facility] called back they will draw labs one week prior.

## 2018-01-27 DIAGNOSIS — I4891 Unspecified atrial fibrillation: Secondary | ICD-10-CM | POA: Diagnosis not present

## 2018-01-27 DIAGNOSIS — I5032 Chronic diastolic (congestive) heart failure: Secondary | ICD-10-CM | POA: Diagnosis not present

## 2018-01-27 DIAGNOSIS — I272 Pulmonary hypertension, unspecified: Secondary | ICD-10-CM | POA: Diagnosis not present

## 2018-01-27 DIAGNOSIS — I350 Nonrheumatic aortic (valve) stenosis: Secondary | ICD-10-CM | POA: Diagnosis not present

## 2018-01-27 DIAGNOSIS — N184 Chronic kidney disease, stage 4 (severe): Secondary | ICD-10-CM | POA: Diagnosis not present

## 2018-01-27 DIAGNOSIS — Z9981 Dependence on supplemental oxygen: Secondary | ICD-10-CM | POA: Diagnosis not present

## 2018-01-31 ENCOUNTER — Encounter: Payer: Self-pay | Admitting: Family Medicine

## 2018-01-31 DIAGNOSIS — I5032 Chronic diastolic (congestive) heart failure: Secondary | ICD-10-CM | POA: Diagnosis not present

## 2018-01-31 DIAGNOSIS — Z9981 Dependence on supplemental oxygen: Secondary | ICD-10-CM | POA: Diagnosis not present

## 2018-01-31 DIAGNOSIS — I272 Pulmonary hypertension, unspecified: Secondary | ICD-10-CM | POA: Diagnosis not present

## 2018-01-31 DIAGNOSIS — N184 Chronic kidney disease, stage 4 (severe): Secondary | ICD-10-CM | POA: Diagnosis not present

## 2018-01-31 DIAGNOSIS — I4891 Unspecified atrial fibrillation: Secondary | ICD-10-CM | POA: Diagnosis not present

## 2018-01-31 DIAGNOSIS — I350 Nonrheumatic aortic (valve) stenosis: Secondary | ICD-10-CM | POA: Diagnosis not present

## 2018-02-03 DIAGNOSIS — N184 Chronic kidney disease, stage 4 (severe): Secondary | ICD-10-CM | POA: Diagnosis not present

## 2018-02-03 DIAGNOSIS — I4891 Unspecified atrial fibrillation: Secondary | ICD-10-CM | POA: Diagnosis not present

## 2018-02-03 DIAGNOSIS — I350 Nonrheumatic aortic (valve) stenosis: Secondary | ICD-10-CM | POA: Diagnosis not present

## 2018-02-03 DIAGNOSIS — I5032 Chronic diastolic (congestive) heart failure: Secondary | ICD-10-CM | POA: Diagnosis not present

## 2018-02-03 DIAGNOSIS — I272 Pulmonary hypertension, unspecified: Secondary | ICD-10-CM | POA: Diagnosis not present

## 2018-02-03 DIAGNOSIS — Z9981 Dependence on supplemental oxygen: Secondary | ICD-10-CM | POA: Diagnosis not present

## 2018-02-04 ENCOUNTER — Telehealth: Payer: Self-pay | Admitting: *Deleted

## 2018-02-04 NOTE — Telephone Encounter (Signed)
Aranesp is unlikely going to work with such severe anemia. I do not prescribe home Aranesp injection, especially with such severe anemia. If hospice program felt that could be given, the hospice medical director can prescribe Aranesp through hospice coverage

## 2018-02-04 NOTE — Telephone Encounter (Signed)
Spoke with Hospice nurse- she is going to follow up with medical director and daughter.

## 2018-02-04 NOTE — Telephone Encounter (Signed)
This RN returned call to daughter and informed her of communication between [employer] and Hospice.  Informed dtr she should expect further communication from the hospice nurse.  Note [name] ( dtr ) did inquire " did Dr Alvy Bimler agree that an aranesp shot may help her ?"  This RN informed [name] - Dr Alvy Bimler is being made aware of her request as well as reviewed goal of care for comfort with least amount of burden on the patient. Ideally concerns and care should be managed between hospice and Dr Alvy Bimler for best outcome.  No further needs at this time.

## 2018-02-04 NOTE — Telephone Encounter (Signed)
LM for daughter to call Dr Calton Dach nurse.  LM for Hospice nurse, Colletta Maryland to call Dr Calton Dach nurse.   Mrs Facey's Hgb is 6.7.  Pt is very frail and needs 3 people to transport her. Would not be able to transfuse at the [facility].

## 2018-02-04 NOTE — Telephone Encounter (Signed)
"  Gina Wilkinson, yes, the HGB checked yesterday is pretty low.  She could have an UGI bleed but we don't know.  The blod transfusion seemed to put a lot of extra fluid on her, we thought she was going to be a loser.  I don't know what Dr. Alvy Bimler would suggest.  Maybe an Aranesp shot would help if it would help for a hospice nurse to give.  She is a lot better.  Eaten better now and a really good day yesterday.  Call me back and we can talk."  Return number [phone].

## 2018-02-07 ENCOUNTER — Telehealth: Payer: Self-pay

## 2018-02-07 DIAGNOSIS — I5032 Chronic diastolic (congestive) heart failure: Secondary | ICD-10-CM | POA: Diagnosis not present

## 2018-02-07 DIAGNOSIS — N184 Chronic kidney disease, stage 4 (severe): Secondary | ICD-10-CM | POA: Diagnosis not present

## 2018-02-07 DIAGNOSIS — Z9981 Dependence on supplemental oxygen: Secondary | ICD-10-CM | POA: Diagnosis not present

## 2018-02-07 DIAGNOSIS — I4891 Unspecified atrial fibrillation: Secondary | ICD-10-CM | POA: Diagnosis not present

## 2018-02-07 DIAGNOSIS — I272 Pulmonary hypertension, unspecified: Secondary | ICD-10-CM | POA: Diagnosis not present

## 2018-02-07 DIAGNOSIS — I350 Nonrheumatic aortic (valve) stenosis: Secondary | ICD-10-CM | POA: Diagnosis not present

## 2018-02-07 NOTE — Telephone Encounter (Signed)
[facility] physician called and left message. She wants Dr. Alvy Bimler to call her cell at [phone]. Dr. Randalyn Rhea ? Spelling of name.

## 2018-02-10 ENCOUNTER — Telehealth: Payer: Self-pay

## 2018-02-10 ENCOUNTER — Telehealth: Payer: Self-pay | Admitting: Hematology and Oncology

## 2018-02-10 ENCOUNTER — Other Ambulatory Visit: Payer: Self-pay | Admitting: Hematology and Oncology

## 2018-02-10 NOTE — Telephone Encounter (Signed)
Called and talked with Gina Wilkinson regarding 4/1

## 2018-02-10 NOTE — Telephone Encounter (Signed)
Called back per Dr. Alvy Bimler. Will resume Aranesp if they are agreeable to no blood transfusion, stable vital signs and hemoglobin greater than 8, daughter is agreeable. She would like to come in 3/29 and see Dr. Alvy Bimler and have lab work, they are available all day on 3/29. Unless Dr. Alvy Bimler thinks that is too long.

## 2018-02-10 NOTE — Telephone Encounter (Signed)
Called regarding after hours call to [facility] regarding appt. [name] said she knows that her Mom does not qualify for the Aranesp injection now. She thinks her Mom's hgb is not as low as it showed on the lab work from hospice and it was not a true reading, they hard time drawing the lab work.  Her Mom is walking better with her walker. She would like appt in one week for lab work. She will bring her mom in to [facility]. What does her Mom's hgb need to be to qualify for the Aranesp injection? Instructed daughter that I will call her back.

## 2018-02-10 NOTE — Telephone Encounter (Signed)
I have spoken with scheduler to schedule

## 2018-02-10 NOTE — Telephone Encounter (Signed)
I spoke with the hospice medical director. I offered the opinion that darbepoetin injection at this point is unlikely going to be beneficial.   It can certainly cause harm because of the link with heart disease and stroke/thrombosis risks. With her significant low hemoglobin recently, if the patient or family members are unable to accept that anemia as a eventual terminal cause of death, I would recommend blood transfusion to be done in the monitored setting. When we transfused the patient with blood last month, it appears that the patient had side effects with volume overload after wards.   Given her frail status and background history of heart disease, her blood transfusion will have to be done in the monitored setting which we will not be able to provide at the cancer center.

## 2018-02-13 DIAGNOSIS — I272 Pulmonary hypertension, unspecified: Secondary | ICD-10-CM | POA: Diagnosis not present

## 2018-02-13 DIAGNOSIS — N184 Chronic kidney disease, stage 4 (severe): Secondary | ICD-10-CM | POA: Diagnosis not present

## 2018-02-13 DIAGNOSIS — Z9981 Dependence on supplemental oxygen: Secondary | ICD-10-CM | POA: Diagnosis not present

## 2018-02-13 DIAGNOSIS — I4891 Unspecified atrial fibrillation: Secondary | ICD-10-CM | POA: Diagnosis not present

## 2018-02-13 DIAGNOSIS — I350 Nonrheumatic aortic (valve) stenosis: Secondary | ICD-10-CM | POA: Diagnosis not present

## 2018-02-13 DIAGNOSIS — I5032 Chronic diastolic (congestive) heart failure: Secondary | ICD-10-CM | POA: Diagnosis not present

## 2018-02-14 DIAGNOSIS — I4891 Unspecified atrial fibrillation: Secondary | ICD-10-CM | POA: Diagnosis not present

## 2018-02-14 DIAGNOSIS — Z9981 Dependence on supplemental oxygen: Secondary | ICD-10-CM | POA: Diagnosis not present

## 2018-02-14 DIAGNOSIS — N184 Chronic kidney disease, stage 4 (severe): Secondary | ICD-10-CM | POA: Diagnosis not present

## 2018-02-14 DIAGNOSIS — I5032 Chronic diastolic (congestive) heart failure: Secondary | ICD-10-CM | POA: Diagnosis not present

## 2018-02-14 DIAGNOSIS — I272 Pulmonary hypertension, unspecified: Secondary | ICD-10-CM | POA: Diagnosis not present

## 2018-02-14 DIAGNOSIS — I350 Nonrheumatic aortic (valve) stenosis: Secondary | ICD-10-CM | POA: Diagnosis not present

## 2018-02-16 ENCOUNTER — Other Ambulatory Visit: Payer: Self-pay | Admitting: Family Medicine

## 2018-02-20 DIAGNOSIS — N184 Chronic kidney disease, stage 4 (severe): Secondary | ICD-10-CM | POA: Diagnosis not present

## 2018-02-20 DIAGNOSIS — I272 Pulmonary hypertension, unspecified: Secondary | ICD-10-CM | POA: Diagnosis not present

## 2018-02-20 DIAGNOSIS — I4891 Unspecified atrial fibrillation: Secondary | ICD-10-CM | POA: Diagnosis not present

## 2018-02-20 DIAGNOSIS — I350 Nonrheumatic aortic (valve) stenosis: Secondary | ICD-10-CM | POA: Diagnosis not present

## 2018-02-20 DIAGNOSIS — I5032 Chronic diastolic (congestive) heart failure: Secondary | ICD-10-CM | POA: Diagnosis not present

## 2018-02-20 DIAGNOSIS — Z9981 Dependence on supplemental oxygen: Secondary | ICD-10-CM | POA: Diagnosis not present

## 2018-02-24 ENCOUNTER — Other Ambulatory Visit: Payer: Medicare Other

## 2018-02-24 ENCOUNTER — Ambulatory Visit: Payer: Medicare Other | Admitting: Hematology and Oncology

## 2018-02-24 ENCOUNTER — Ambulatory Visit: Payer: Medicare Other

## 2018-02-24 DIAGNOSIS — I350 Nonrheumatic aortic (valve) stenosis: Secondary | ICD-10-CM | POA: Diagnosis not present

## 2018-02-24 DIAGNOSIS — N184 Chronic kidney disease, stage 4 (severe): Secondary | ICD-10-CM | POA: Diagnosis not present

## 2018-02-24 DIAGNOSIS — D631 Anemia in chronic kidney disease: Secondary | ICD-10-CM | POA: Diagnosis not present

## 2018-02-24 DIAGNOSIS — I272 Pulmonary hypertension, unspecified: Secondary | ICD-10-CM | POA: Diagnosis not present

## 2018-02-24 DIAGNOSIS — I4891 Unspecified atrial fibrillation: Secondary | ICD-10-CM | POA: Diagnosis not present

## 2018-02-24 DIAGNOSIS — Z9981 Dependence on supplemental oxygen: Secondary | ICD-10-CM | POA: Diagnosis not present

## 2018-02-24 DIAGNOSIS — E46 Unspecified protein-calorie malnutrition: Secondary | ICD-10-CM | POA: Diagnosis not present

## 2018-02-24 DIAGNOSIS — D469 Myelodysplastic syndrome, unspecified: Secondary | ICD-10-CM | POA: Diagnosis not present

## 2018-02-24 DIAGNOSIS — I5032 Chronic diastolic (congestive) heart failure: Secondary | ICD-10-CM | POA: Diagnosis not present

## 2018-02-28 DIAGNOSIS — I350 Nonrheumatic aortic (valve) stenosis: Secondary | ICD-10-CM | POA: Diagnosis not present

## 2018-02-28 DIAGNOSIS — N184 Chronic kidney disease, stage 4 (severe): Secondary | ICD-10-CM | POA: Diagnosis not present

## 2018-02-28 DIAGNOSIS — I4891 Unspecified atrial fibrillation: Secondary | ICD-10-CM | POA: Diagnosis not present

## 2018-02-28 DIAGNOSIS — Z9981 Dependence on supplemental oxygen: Secondary | ICD-10-CM | POA: Diagnosis not present

## 2018-02-28 DIAGNOSIS — I5032 Chronic diastolic (congestive) heart failure: Secondary | ICD-10-CM | POA: Diagnosis not present

## 2018-02-28 DIAGNOSIS — I272 Pulmonary hypertension, unspecified: Secondary | ICD-10-CM | POA: Diagnosis not present

## 2018-03-07 DIAGNOSIS — I350 Nonrheumatic aortic (valve) stenosis: Secondary | ICD-10-CM | POA: Diagnosis not present

## 2018-03-07 DIAGNOSIS — I5032 Chronic diastolic (congestive) heart failure: Secondary | ICD-10-CM | POA: Diagnosis not present

## 2018-03-07 DIAGNOSIS — I272 Pulmonary hypertension, unspecified: Secondary | ICD-10-CM | POA: Diagnosis not present

## 2018-03-07 DIAGNOSIS — Z9981 Dependence on supplemental oxygen: Secondary | ICD-10-CM | POA: Diagnosis not present

## 2018-03-07 DIAGNOSIS — I4891 Unspecified atrial fibrillation: Secondary | ICD-10-CM | POA: Diagnosis not present

## 2018-03-07 DIAGNOSIS — N184 Chronic kidney disease, stage 4 (severe): Secondary | ICD-10-CM | POA: Diagnosis not present

## 2018-03-10 ENCOUNTER — Telehealth: Payer: Self-pay | Admitting: *Deleted

## 2018-03-10 ENCOUNTER — Telehealth: Payer: Self-pay

## 2018-03-10 NOTE — Telephone Encounter (Signed)
[facility] called and left message. Patient passed away 03-18-2023.

## 2018-03-10 NOTE — Telephone Encounter (Signed)
Gina Wilkinson wanted to let Dr. McDiarmid know that Gina Wilkinson passed away on February 27, 2023 (03-12-2018). Garen Woolbright, Salome Spotted, CMA

## 2018-03-24 ENCOUNTER — Ambulatory Visit: Payer: Medicare Other

## 2018-03-24 ENCOUNTER — Other Ambulatory Visit: Payer: Medicare Other

## 2018-03-26 DEATH — deceased

## 2018-03-28 ENCOUNTER — Telehealth: Payer: Self-pay | Admitting: Family Medicine

## 2018-03-28 NOTE — Telephone Encounter (Signed)
Pt's daughter called to inform us that [name] passed away earlier this month.

## 2018-03-31 NOTE — Telephone Encounter (Signed)
Please list Ms Gina Wilkinson (DOB 12-08-2019) as deceased.

## 2018-04-08 ENCOUNTER — Other Ambulatory Visit: Payer: Self-pay | Admitting: Family Medicine

## 2018-04-08 DIAGNOSIS — E038 Other specified hypothyroidism: Secondary | ICD-10-CM

## 2018-04-09 ENCOUNTER — Encounter: Payer: Self-pay | Admitting: Family Medicine

## 2018-04-09 NOTE — Progress Notes (Unsigned)
Per telephone from patient's daughter, Ms Gina Wilkinson is deceased.

## 2018-05-22 ENCOUNTER — Ambulatory Visit: Payer: Medicare Other | Admitting: Hematology and Oncology

## 2018-05-22 ENCOUNTER — Ambulatory Visit: Payer: Medicare Other

## 2018-05-22 ENCOUNTER — Other Ambulatory Visit: Payer: Medicare Other

## 2019-02-01 IMAGING — CR DG CHEST 1V PORT
1 series · 1 of 1 positions shown · non-contrast
Comparison: PA and lateral chest 01/17/2016.

CLINICAL DATA: Decreased oxygen saturation.

EXAM:
PORTABLE CHEST 1 VIEW

[AP]
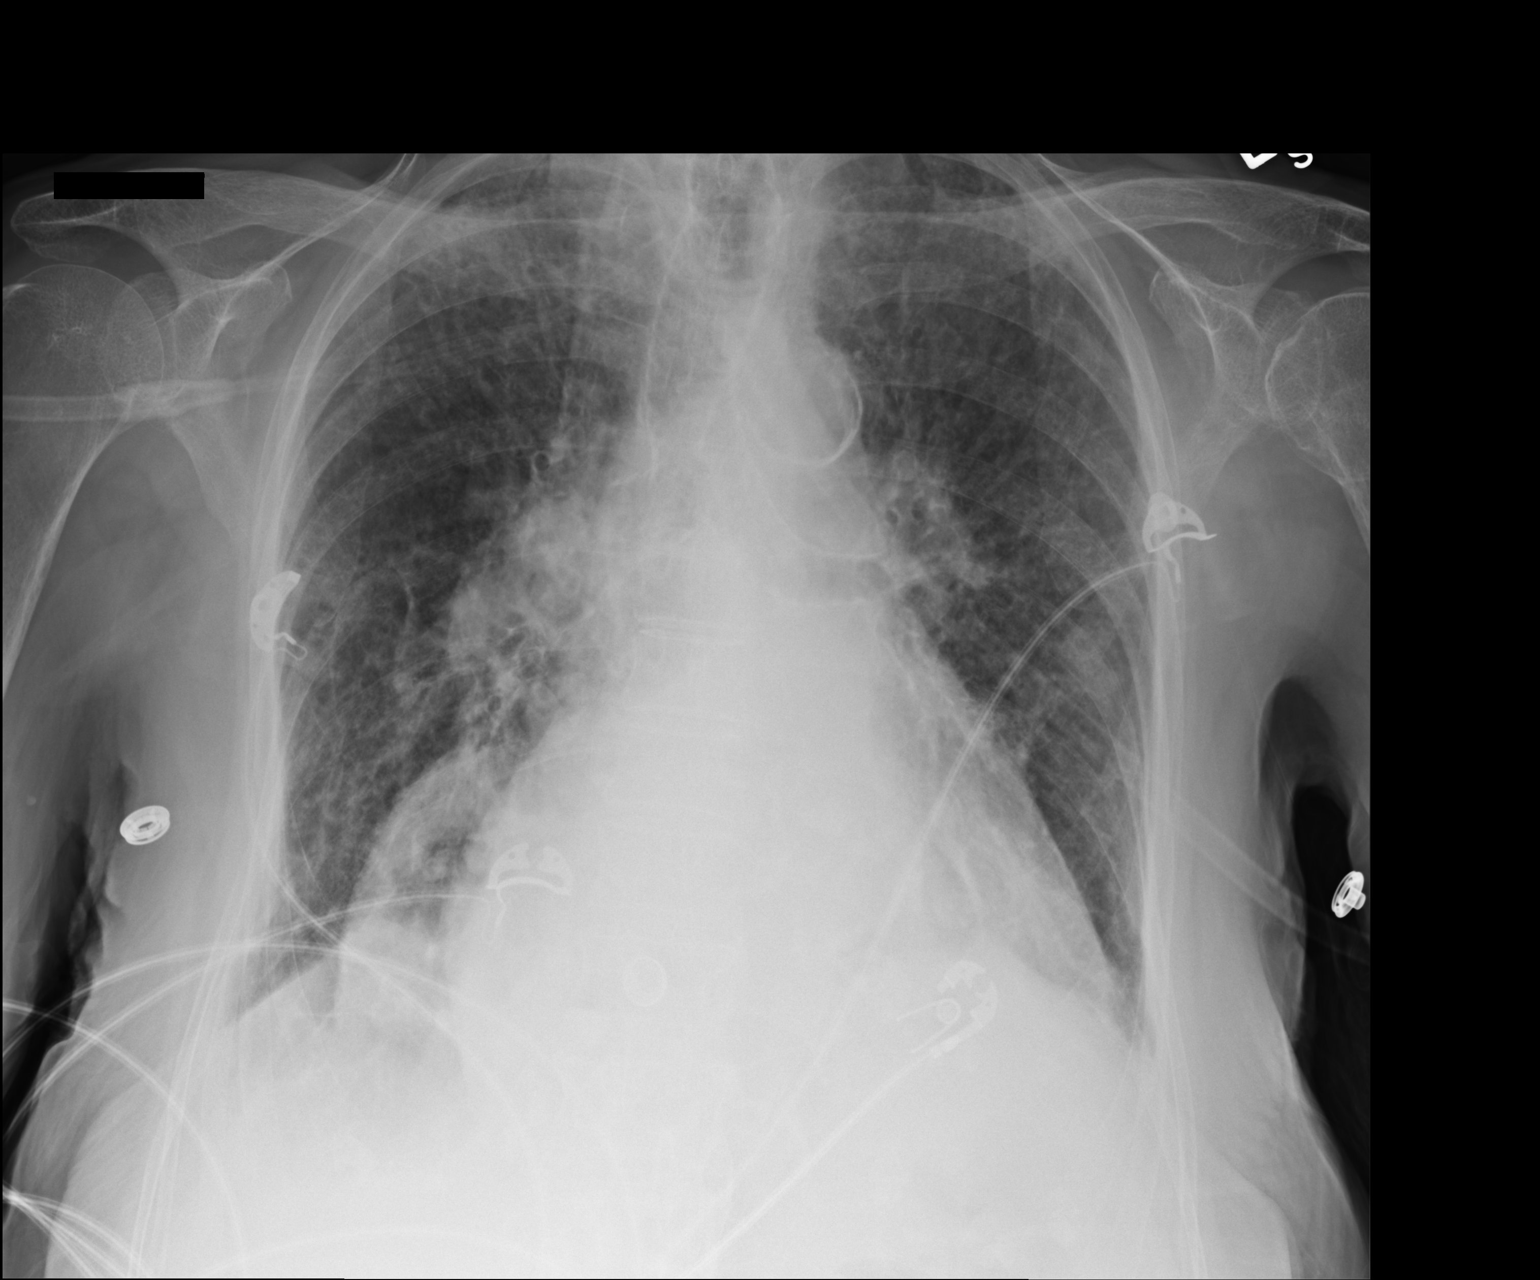

[1 of 1 positions shown; findings below may reference images not displayed]

FINDINGS: There is cardiomegaly and mild interstitial edema. No consolidative
process, pneumothorax or effusion. Large hiatal hernia is noted.
IMPRESSION: Cardiomegaly and mild interstitial edema.

Large hiatal hernia.

## 2019-02-03 IMAGING — CR DG CHEST 1V PORT
1 series · 1 of 1 positions shown · non-contrast
Comparison: Radiograph December 29, 2016.

CLINICAL DATA: Dyspnea.

EXAM:
PORTABLE CHEST 1 VIEW

[AP]
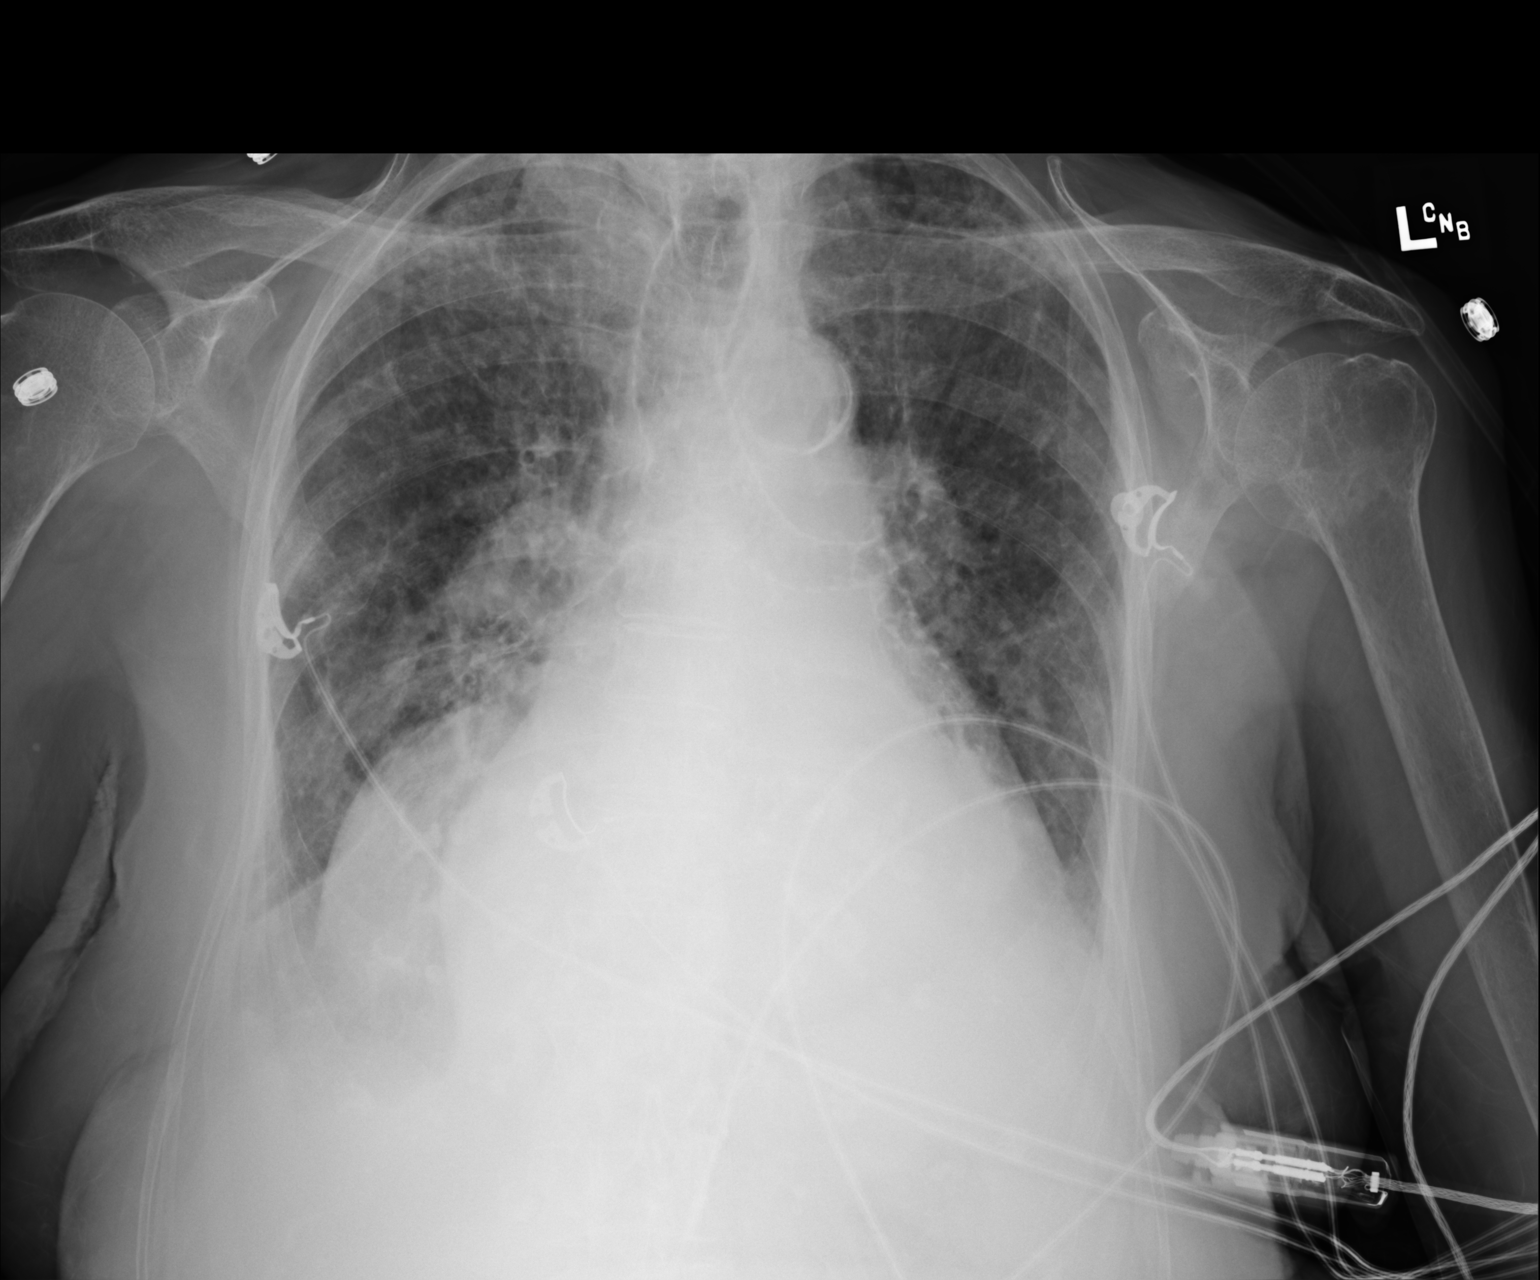

[1 of 1 positions shown; findings below may reference images not displayed]

FINDINGS: Stable cardiomegaly. Atherosclerosis of thoracic aorta is noted. No
pneumothorax is noted. Large hiatal hernia is again noted. Bony
thorax is unremarkable. Stable right midlung opacity is noted
concerning for inflammation or atelectasis. Stable left basilar
opacity is noted concerning for atelectasis or infiltrate with
possible associated pleural effusion.
IMPRESSION: Large hiatal hernia. Aortic atherosclerosis. Stable bilateral lung
opacities are noted concerning for atelectasis or infiltrate, with
probable left pleural effusion.
